# Patient Record
Sex: Female | Born: 1935 | Race: White | Hispanic: No | Smoking: Never smoker
Health system: Southern US, Community
[De-identification: ages and names within clinical notes are randomized; demographics above are authoritative.]

## PROBLEM LIST (undated history)

## (undated) DIAGNOSIS — I639 Cerebral infarction, unspecified: Secondary | ICD-10-CM

## (undated) DIAGNOSIS — A048 Other specified bacterial intestinal infections: Secondary | ICD-10-CM

## (undated) DIAGNOSIS — J45909 Unspecified asthma, uncomplicated: Secondary | ICD-10-CM

## (undated) DIAGNOSIS — I10 Essential (primary) hypertension: Secondary | ICD-10-CM

## (undated) DIAGNOSIS — F0390 Unspecified dementia without behavioral disturbance: Secondary | ICD-10-CM

## (undated) HISTORY — PX: CHOLECYSTECTOMY: SHX55

## (undated) HISTORY — PX: COLECTOMY: SHX59

## (undated) HISTORY — DX: Other specified bacterial intestinal infections: A04.8

---

## 2008-08-28 ENCOUNTER — Ambulatory Visit (HOSPITAL_COMMUNITY): Admission: RE | Admit: 2008-08-28 | Discharge: 2008-08-28 | Payer: Self-pay | Admitting: Preventative Medicine

## 2009-01-11 ENCOUNTER — Inpatient Hospital Stay: Admission: RE | Admit: 2009-01-11 | Discharge: 2009-03-09 | Payer: Self-pay | Admitting: Internal Medicine

## 2009-02-18 ENCOUNTER — Ambulatory Visit (HOSPITAL_COMMUNITY): Admission: RE | Admit: 2009-02-18 | Discharge: 2009-02-18 | Payer: Self-pay | Admitting: Internal Medicine

## 2009-04-13 ENCOUNTER — Encounter (HOSPITAL_COMMUNITY): Admission: RE | Admit: 2009-04-13 | Discharge: 2009-05-13 | Payer: Self-pay | Admitting: Internal Medicine

## 2009-05-14 ENCOUNTER — Encounter (HOSPITAL_COMMUNITY): Admission: RE | Admit: 2009-05-14 | Discharge: 2009-06-13 | Payer: Self-pay | Admitting: Internal Medicine

## 2009-06-15 ENCOUNTER — Encounter (HOSPITAL_COMMUNITY): Admission: RE | Admit: 2009-06-15 | Discharge: 2009-07-14 | Payer: Self-pay | Admitting: Internal Medicine

## 2011-01-24 LAB — GLUCOSE, CAPILLARY
Glucose-Capillary: 116 mg/dL — ABNORMAL HIGH (ref 70–99)
Glucose-Capillary: 117 mg/dL — ABNORMAL HIGH (ref 70–99)
Glucose-Capillary: 118 mg/dL — ABNORMAL HIGH (ref 70–99)
Glucose-Capillary: 119 mg/dL — ABNORMAL HIGH (ref 70–99)
Glucose-Capillary: 136 mg/dL — ABNORMAL HIGH (ref 70–99)
Glucose-Capillary: 144 mg/dL — ABNORMAL HIGH (ref 70–99)
Glucose-Capillary: 154 mg/dL — ABNORMAL HIGH (ref 70–99)
Glucose-Capillary: 163 mg/dL — ABNORMAL HIGH (ref 70–99)
Glucose-Capillary: 164 mg/dL — ABNORMAL HIGH (ref 70–99)
Glucose-Capillary: 165 mg/dL — ABNORMAL HIGH (ref 70–99)
Glucose-Capillary: 172 mg/dL — ABNORMAL HIGH (ref 70–99)
Glucose-Capillary: 173 mg/dL — ABNORMAL HIGH (ref 70–99)
Glucose-Capillary: 189 mg/dL — ABNORMAL HIGH (ref 70–99)
Glucose-Capillary: 207 mg/dL — ABNORMAL HIGH (ref 70–99)
Glucose-Capillary: 214 mg/dL — ABNORMAL HIGH (ref 70–99)
Glucose-Capillary: 114 mg/dL — ABNORMAL HIGH (ref 70–99)
Glucose-Capillary: 121 mg/dL — ABNORMAL HIGH (ref 70–99)
Glucose-Capillary: 122 mg/dL — ABNORMAL HIGH (ref 70–99)
Glucose-Capillary: 131 mg/dL — ABNORMAL HIGH (ref 70–99)
Glucose-Capillary: 131 mg/dL — ABNORMAL HIGH (ref 70–99)
Glucose-Capillary: 135 mg/dL — ABNORMAL HIGH (ref 70–99)
Glucose-Capillary: 136 mg/dL — ABNORMAL HIGH (ref 70–99)
Glucose-Capillary: 141 mg/dL — ABNORMAL HIGH (ref 70–99)
Glucose-Capillary: 142 mg/dL — ABNORMAL HIGH (ref 70–99)
Glucose-Capillary: 147 mg/dL — ABNORMAL HIGH (ref 70–99)
Glucose-Capillary: 150 mg/dL — ABNORMAL HIGH (ref 70–99)
Glucose-Capillary: 152 mg/dL — ABNORMAL HIGH (ref 70–99)
Glucose-Capillary: 152 mg/dL — ABNORMAL HIGH (ref 70–99)
Glucose-Capillary: 152 mg/dL — ABNORMAL HIGH (ref 70–99)
Glucose-Capillary: 156 mg/dL — ABNORMAL HIGH (ref 70–99)
Glucose-Capillary: 161 mg/dL — ABNORMAL HIGH (ref 70–99)
Glucose-Capillary: 163 mg/dL — ABNORMAL HIGH (ref 70–99)
Glucose-Capillary: 163 mg/dL — ABNORMAL HIGH (ref 70–99)
Glucose-Capillary: 164 mg/dL — ABNORMAL HIGH (ref 70–99)
Glucose-Capillary: 168 mg/dL — ABNORMAL HIGH (ref 70–99)
Glucose-Capillary: 171 mg/dL — ABNORMAL HIGH (ref 70–99)
Glucose-Capillary: 176 mg/dL — ABNORMAL HIGH (ref 70–99)
Glucose-Capillary: 179 mg/dL — ABNORMAL HIGH (ref 70–99)
Glucose-Capillary: 182 mg/dL — ABNORMAL HIGH (ref 70–99)
Glucose-Capillary: 183 mg/dL — ABNORMAL HIGH (ref 70–99)
Glucose-Capillary: 183 mg/dL — ABNORMAL HIGH (ref 70–99)
Glucose-Capillary: 187 mg/dL — ABNORMAL HIGH (ref 70–99)
Glucose-Capillary: 189 mg/dL — ABNORMAL HIGH (ref 70–99)
Glucose-Capillary: 190 mg/dL — ABNORMAL HIGH (ref 70–99)
Glucose-Capillary: 196 mg/dL — ABNORMAL HIGH (ref 70–99)
Glucose-Capillary: 205 mg/dL — ABNORMAL HIGH (ref 70–99)
Glucose-Capillary: 217 mg/dL — ABNORMAL HIGH (ref 70–99)
Glucose-Capillary: 223 mg/dL — ABNORMAL HIGH (ref 70–99)
Glucose-Capillary: 234 mg/dL — ABNORMAL HIGH (ref 70–99)

## 2011-01-25 LAB — GLUCOSE, CAPILLARY
Glucose-Capillary: 138 mg/dL — ABNORMAL HIGH (ref 70–99)
Glucose-Capillary: 142 mg/dL — ABNORMAL HIGH (ref 70–99)
Glucose-Capillary: 145 mg/dL — ABNORMAL HIGH (ref 70–99)
Glucose-Capillary: 155 mg/dL — ABNORMAL HIGH (ref 70–99)
Glucose-Capillary: 157 mg/dL — ABNORMAL HIGH (ref 70–99)
Glucose-Capillary: 158 mg/dL — ABNORMAL HIGH (ref 70–99)
Glucose-Capillary: 164 mg/dL — ABNORMAL HIGH (ref 70–99)
Glucose-Capillary: 165 mg/dL — ABNORMAL HIGH (ref 70–99)
Glucose-Capillary: 167 mg/dL — ABNORMAL HIGH (ref 70–99)
Glucose-Capillary: 169 mg/dL — ABNORMAL HIGH (ref 70–99)
Glucose-Capillary: 170 mg/dL — ABNORMAL HIGH (ref 70–99)
Glucose-Capillary: 175 mg/dL — ABNORMAL HIGH (ref 70–99)
Glucose-Capillary: 182 mg/dL — ABNORMAL HIGH (ref 70–99)
Glucose-Capillary: 191 mg/dL — ABNORMAL HIGH (ref 70–99)
Glucose-Capillary: 191 mg/dL — ABNORMAL HIGH (ref 70–99)
Glucose-Capillary: 192 mg/dL — ABNORMAL HIGH (ref 70–99)
Glucose-Capillary: 192 mg/dL — ABNORMAL HIGH (ref 70–99)
Glucose-Capillary: 192 mg/dL — ABNORMAL HIGH (ref 70–99)
Glucose-Capillary: 196 mg/dL — ABNORMAL HIGH (ref 70–99)
Glucose-Capillary: 197 mg/dL — ABNORMAL HIGH (ref 70–99)
Glucose-Capillary: 199 mg/dL — ABNORMAL HIGH (ref 70–99)
Glucose-Capillary: 209 mg/dL — ABNORMAL HIGH (ref 70–99)
Glucose-Capillary: 214 mg/dL — ABNORMAL HIGH (ref 70–99)
Glucose-Capillary: 214 mg/dL — ABNORMAL HIGH (ref 70–99)
Glucose-Capillary: 215 mg/dL — ABNORMAL HIGH (ref 70–99)
Glucose-Capillary: 218 mg/dL — ABNORMAL HIGH (ref 70–99)
Glucose-Capillary: 221 mg/dL — ABNORMAL HIGH (ref 70–99)
Glucose-Capillary: 225 mg/dL — ABNORMAL HIGH (ref 70–99)
Glucose-Capillary: 233 mg/dL — ABNORMAL HIGH (ref 70–99)
Glucose-Capillary: 153 mg/dL — ABNORMAL HIGH (ref 70–99)
Glucose-Capillary: 164 mg/dL — ABNORMAL HIGH (ref 70–99)
Glucose-Capillary: 164 mg/dL — ABNORMAL HIGH (ref 70–99)
Glucose-Capillary: 165 mg/dL — ABNORMAL HIGH (ref 70–99)
Glucose-Capillary: 169 mg/dL — ABNORMAL HIGH (ref 70–99)
Glucose-Capillary: 179 mg/dL — ABNORMAL HIGH (ref 70–99)
Glucose-Capillary: 186 mg/dL — ABNORMAL HIGH (ref 70–99)
Glucose-Capillary: 189 mg/dL — ABNORMAL HIGH (ref 70–99)
Glucose-Capillary: 194 mg/dL — ABNORMAL HIGH (ref 70–99)
Glucose-Capillary: 194 mg/dL — ABNORMAL HIGH (ref 70–99)
Glucose-Capillary: 195 mg/dL — ABNORMAL HIGH (ref 70–99)
Glucose-Capillary: 197 mg/dL — ABNORMAL HIGH (ref 70–99)
Glucose-Capillary: 197 mg/dL — ABNORMAL HIGH (ref 70–99)
Glucose-Capillary: 199 mg/dL — ABNORMAL HIGH (ref 70–99)
Glucose-Capillary: 199 mg/dL — ABNORMAL HIGH (ref 70–99)
Glucose-Capillary: 200 mg/dL — ABNORMAL HIGH (ref 70–99)
Glucose-Capillary: 206 mg/dL — ABNORMAL HIGH (ref 70–99)
Glucose-Capillary: 207 mg/dL — ABNORMAL HIGH (ref 70–99)
Glucose-Capillary: 207 mg/dL — ABNORMAL HIGH (ref 70–99)
Glucose-Capillary: 219 mg/dL — ABNORMAL HIGH (ref 70–99)
Glucose-Capillary: 221 mg/dL — ABNORMAL HIGH (ref 70–99)
Glucose-Capillary: 228 mg/dL — ABNORMAL HIGH (ref 70–99)
Glucose-Capillary: 229 mg/dL — ABNORMAL HIGH (ref 70–99)
Glucose-Capillary: 237 mg/dL — ABNORMAL HIGH (ref 70–99)
Glucose-Capillary: 238 mg/dL — ABNORMAL HIGH (ref 70–99)
Glucose-Capillary: 249 mg/dL — ABNORMAL HIGH (ref 70–99)
Glucose-Capillary: 256 mg/dL — ABNORMAL HIGH (ref 70–99)
Glucose-Capillary: 258 mg/dL — ABNORMAL HIGH (ref 70–99)
Glucose-Capillary: 269 mg/dL — ABNORMAL HIGH (ref 70–99)

## 2011-01-26 LAB — GLUCOSE, CAPILLARY
Glucose-Capillary: 146 mg/dL — ABNORMAL HIGH (ref 70–99)
Glucose-Capillary: 174 mg/dL — ABNORMAL HIGH (ref 70–99)
Glucose-Capillary: 180 mg/dL — ABNORMAL HIGH (ref 70–99)
Glucose-Capillary: 183 mg/dL — ABNORMAL HIGH (ref 70–99)
Glucose-Capillary: 186 mg/dL — ABNORMAL HIGH (ref 70–99)

## 2012-04-06 ENCOUNTER — Emergency Department (HOSPITAL_COMMUNITY): Payer: Medicare Other

## 2012-04-06 ENCOUNTER — Emergency Department (HOSPITAL_COMMUNITY)
Admission: EM | Admit: 2012-04-06 | Discharge: 2012-04-06 | Disposition: A | Discharge status: Other Acute Inpatient Hospital | Payer: Medicare Other | Attending: Emergency Medicine | Admitting: Emergency Medicine

## 2012-04-06 ENCOUNTER — Encounter (HOSPITAL_COMMUNITY): Payer: Self-pay | Admitting: *Deleted

## 2012-04-06 DIAGNOSIS — S72009A Fracture of unspecified part of neck of unspecified femur, initial encounter for closed fracture: Secondary | ICD-10-CM | POA: Insufficient documentation

## 2012-04-06 DIAGNOSIS — D72829 Elevated white blood cell count, unspecified: Secondary | ICD-10-CM | POA: Insufficient documentation

## 2012-04-06 DIAGNOSIS — I10 Essential (primary) hypertension: Secondary | ICD-10-CM | POA: Insufficient documentation

## 2012-04-06 DIAGNOSIS — E871 Hypo-osmolality and hyponatremia: Secondary | ICD-10-CM | POA: Insufficient documentation

## 2012-04-06 DIAGNOSIS — W19XXXA Unspecified fall, initial encounter: Secondary | ICD-10-CM

## 2012-04-06 DIAGNOSIS — Z8673 Personal history of transient ischemic attack (TIA), and cerebral infarction without residual deficits: Secondary | ICD-10-CM | POA: Insufficient documentation

## 2012-04-06 DIAGNOSIS — W08XXXA Fall from other furniture, initial encounter: Secondary | ICD-10-CM | POA: Insufficient documentation

## 2012-04-06 DIAGNOSIS — S72002A Fracture of unspecified part of neck of left femur, initial encounter for closed fracture: Secondary | ICD-10-CM | POA: Insufficient documentation

## 2012-04-06 DIAGNOSIS — Z79899 Other long term (current) drug therapy: Secondary | ICD-10-CM | POA: Insufficient documentation

## 2012-04-06 DIAGNOSIS — Y93E9 Activity, other interior property and clothing maintenance: Secondary | ICD-10-CM | POA: Insufficient documentation

## 2012-04-06 DIAGNOSIS — E119 Type 2 diabetes mellitus without complications: Secondary | ICD-10-CM | POA: Insufficient documentation

## 2012-04-06 DIAGNOSIS — J45909 Unspecified asthma, uncomplicated: Secondary | ICD-10-CM | POA: Insufficient documentation

## 2012-04-06 DIAGNOSIS — M25559 Pain in unspecified hip: Secondary | ICD-10-CM | POA: Insufficient documentation

## 2012-04-06 HISTORY — DX: Essential (primary) hypertension: I10

## 2012-04-06 HISTORY — DX: Cerebral infarction, unspecified: I63.9

## 2012-04-06 HISTORY — DX: Unspecified asthma, uncomplicated: J45.909

## 2012-04-06 LAB — URINALYSIS, ROUTINE W REFLEX MICROSCOPIC
Bilirubin Urine: NEGATIVE
Hgb urine dipstick: NEGATIVE
Nitrite: NEGATIVE
Protein, ur: NEGATIVE mg/dL
Specific Gravity, Urine: 1.025 (ref 1.005–1.030)
Urobilinogen, UA: 0.2 mg/dL (ref 0.0–1.0)

## 2012-04-06 LAB — PROTIME-INR
INR: 1 (ref 0.00–1.49)
Prothrombin Time: 13.4 seconds (ref 11.6–15.2)

## 2012-04-06 LAB — CBC
HCT: 42.1 % (ref 36.0–46.0)
Hemoglobin: 14.7 g/dL (ref 12.0–15.0)
MCH: 32.2 pg (ref 26.0–34.0)
MCHC: 34.9 g/dL (ref 30.0–36.0)
MCV: 92.1 fL (ref 78.0–100.0)
RDW: 12.4 % (ref 11.5–15.5)

## 2012-04-06 LAB — COMPREHENSIVE METABOLIC PANEL
AST: 26 U/L (ref 0–37)
Albumin: 4 g/dL (ref 3.5–5.2)
BUN: 16 mg/dL (ref 6–23)
Calcium: 9.8 mg/dL (ref 8.4–10.5)
Creatinine, Ser: 0.82 mg/dL (ref 0.50–1.10)
Total Bilirubin: 0.9 mg/dL (ref 0.3–1.2)

## 2012-04-06 LAB — DIFFERENTIAL
Basophils Absolute: 0 10*3/uL (ref 0.0–0.1)
Basophils Relative: 0 % (ref 0–1)
Eosinophils Absolute: 0 10*3/uL (ref 0.0–0.7)
Eosinophils Relative: 0 % (ref 0–5)
Monocytes Absolute: 1.1 10*3/uL — ABNORMAL HIGH (ref 0.1–1.0)
Monocytes Relative: 6 % (ref 3–12)
Neutro Abs: 15 10*3/uL — ABNORMAL HIGH (ref 1.7–7.7)

## 2012-04-06 LAB — URINE MICROSCOPIC-ADD ON

## 2012-04-06 LAB — APTT: aPTT: 25 seconds (ref 24–37)

## 2012-04-06 MED ORDER — ONDANSETRON HCL 4 MG/2ML IJ SOLN
INTRAMUSCULAR | Status: AC
  Administered 2012-04-06: 4 mg via INTRAVENOUS
  Filled 2012-04-06: qty 2

## 2012-04-06 MED ORDER — ONDANSETRON HCL 4 MG/2ML IJ SOLN
4.0000 mg | Freq: Once | INTRAMUSCULAR | Status: AC
  Administered 2012-04-06: 4 mg via INTRAVENOUS

## 2012-04-06 MED ORDER — HYDROMORPHONE HCL PF 1 MG/ML IJ SOLN
1.0000 mg | Freq: Once | INTRAMUSCULAR | Status: AC
  Administered 2012-04-06: 1 mg via INTRAVENOUS
  Filled 2012-04-06: qty 1

## 2012-04-06 MED ORDER — ONDANSETRON HCL 4 MG/2ML IJ SOLN
4.0000 mg | Freq: Once | INTRAMUSCULAR | Status: AC
  Administered 2012-04-06: 4 mg via INTRAVENOUS
  Filled 2012-04-06: qty 2

## 2012-04-06 MED ORDER — MORPHINE SULFATE 4 MG/ML IJ SOLN
4.0000 mg | Freq: Once | INTRAMUSCULAR | Status: AC
  Administered 2012-04-06: 4 mg via INTRAVENOUS
  Filled 2012-04-06: qty 1

## 2012-04-06 MED ORDER — SODIUM CHLORIDE 0.9 % IV SOLN
INTRAVENOUS | Status: DC
  Administered 2012-04-06: 15:00:00 via INTRAVENOUS

## 2012-04-06 NOTE — ED Notes (Signed)
Fall approx 0900 this morning.  Denies hitting head/loc.  C/o pain to left hip.  Mild left hip rotation noted.  Pt alert and oriented x 4.  No bruising/abrasions/deformities noted.

## 2012-04-06 NOTE — ED Provider Notes (Signed)
History    This chart was scribed for Ward Givens, MD, MD by Smitty Pluck. The patient was seen in room APA12 and the patient's care was started at 2:54PM.   CSN: [id number]  Arrival date & time 04/06/12  1435   First MD Initiated Contact with Patient 04/06/12 1449      Chief Complaint  Patient presents with  . fall, hip pain     (Consider location/radiation/quality/duration/timing/severity/associated sxs/prior treatment) The history is provided by the patient.   Sabrina [name] is a 78 y.o. female who presents to the Emergency Department BIB EMS complaining of moderate left hip pain due to falling while standing on stool trying to clean a celing fan today this 9  AM. Pt denies hitting head and LOC. Pt was assisted by husband after fall. She states that she has been unable to bear weight or  walk due to pain. Denies using blood anticoagulants. Denies smoking and drinking. Pain has been constant since onset without radiation.    PCP is Dr. Sherryll Burger in [facility] No orthopedist  Past Medical History  Diagnosis Date  . Diabetes mellitus   . Hypertension   . Stroke   . Asthma     Past Surgical History  Procedure Date  . Colon surgery     No family history on file.  History  Substance Use Topics  . Smoking status: Never Smoker   . Smokeless tobacco: Not on file  . Alcohol Use: No  lives at home Lives with husband  OB History    Grav Para Term Preterm Abortions TAB SAB Ect Mult Living                  Review of Systems  All other systems reviewed and are negative.  10 Systems reviewed and all are negative for acute change except as noted in the HPI.    Allergies  Review of patient's allergies indicates no known allergies.  Home Medications   Current Outpatient Rx  Name Route Sig Dispense Refill  . LISINOPRIL 20 MG PO TABS Oral Take 20 mg by mouth daily.    Marland Kitchen METOPROLOL TARTRATE 50 MG PO TABS Oral Take 50 mg by mouth 2 (two) times daily.    Marland Kitchen OMEPRAZOLE 40 MG PO  CPDR Oral Take 40 mg by mouth 2 (two) times daily.      BP 153/71  Pulse 70  Temp 97.9 F (36.6 C) (Oral)  Resp 16  Ht 5\' 3"  (1.6 m)  Wt 136 lb (61.689 kg)  BMI 24.09 kg/m2  SpO2 91%  Vital signs normal    Physical Exam  Nursing note and vitals reviewed. Constitutional: She is oriented to person, place, and time. She appears well-developed and well-nourished. No distress.  HENT:  Head: Normocephalic and atraumatic.  Right Ear: External ear normal.  Left Ear: External ear normal.  Mouth/Throat: Oropharynx is clear and moist.       No pain to palpation of head  Eyes: Conjunctivae and EOM are normal. Pupils are equal, round, and reactive to light.  Neck: Normal range of motion. Neck supple.  Cardiovascular: Normal rate, regular rhythm and normal heart sounds.   No murmur heard. Pulmonary/Chest: Effort normal and breath sounds normal. No respiratory distress. She has no wheezes. She has no rales. She exhibits no tenderness.  Abdominal: Soft. Bowel sounds are normal. She exhibits no distension. There is no tenderness. There is no rebound and no guarding.  Musculoskeletal:  Left knee flexed over pillow so unable to assess limb shortening or rotation but she has pain in her prox left thigh when she attempts to move her leg  Neurological: She is alert and oriented to person, place, and time.  Skin: Skin is warm and dry.  Psychiatric: She has a normal mood and affect. Her behavior is normal.    ED Course  Procedures (including critical care time)   Medications  0.9 %  sodium chloride infusion (  Intravenous New Bag/Given 04/06/12 1516)  morphine 4 MG/ML injection 4 mg (4 mg Intravenous Given 04/06/12 1516)  ondansetron (ZOFRAN) injection 4 mg (4 mg Intravenous Given 04/06/12 1516)  HYDROmorphone (DILAUDID) injection 1 mg (1 mg Intravenous Given 04/06/12 1713)   16:15 Husband in room, states she cannot have any blood products, wants her to go to [facility] where she had colon  surgery.   17:10 Dr Kathi Ludwig orthopedics at [facility] accepts in transfer to their ED.   DIAGNOSTIC STUDIES: Oxygen Saturation is 91% on room air, normal by my interpretation.    COORDINATION OF CARE: 3:02PM EDP discusses pt ED treatment with pt.  3:00PM EDP ordered medication: morphine 4 mg/ml, zofran 4 mg, 0.9% NaCl  Infusion    Results for orders placed during the hospital encounter of 04/06/12  CBC      Component Value Range   WBC 16.9 (*) 4.0 - 10.5 K/uL   RBC 4.57  3.87 - 5.11 MIL/uL   Hemoglobin 14.7  12.0 - 15.0 g/dL   HCT 16.1  09.6 - 04.5 %   MCV 92.1  78.0 - 100.0 fL   MCH 32.2  26.0 - 34.0 pg   MCHC 34.9  30.0 - 36.0 g/dL   RDW 40.9  81.1 - 91.4 %   Platelets 215  150 - 400 K/uL  DIFFERENTIAL      Component Value Range   Neutrophils Relative 89 (*) 43 - 77 %   Neutro Abs 15.0 (*) 1.7 - 7.7 K/uL   Lymphocytes Relative 5 (*) 12 - 46 %   Lymphs Abs 0.8  0.7 - 4.0 K/uL   Monocytes Relative 6  3 - 12 %   Monocytes Absolute 1.1 (*) 0.1 - 1.0 K/uL   Eosinophils Relative 0  0 - 5 %   Eosinophils Absolute 0.0  0.0 - 0.7 K/uL   Basophils Relative 0  0 - 1 %   Basophils Absolute 0.0  0.0 - 0.1 K/uL  COMPREHENSIVE METABOLIC PANEL      Component Value Range   Sodium 132 (*) 135 - 145 mEq/L   Potassium 3.6  3.5 - 5.1 mEq/L   Chloride 94 (*) 96 - 112 mEq/L   CO2 22  19 - 32 mEq/L   Glucose, Bld 286 (*) 70 - 99 mg/dL   BUN 16  6 - 23 mg/dL   Creatinine, Ser 7.82  0.50 - 1.10 mg/dL   Calcium 9.8  8.4 - 95.6 mg/dL   Total Protein 7.6  6.0 - 8.3 g/dL   Albumin 4.0  3.5 - 5.2 g/dL   AST 26  0 - 37 U/L   ALT 18  0 - 35 U/L   Alkaline Phosphatase 60  39 - 117 U/L   Total Bilirubin 0.9  0.3 - 1.2 mg/dL   GFR calc non Af Amer 67 (*) >90 mL/min   GFR calc Af Amer 78 (*) >90 mL/min  APTT      Component Value Range   aPTT  25  24 - 37 seconds  PROTIME-INR      Component Value Range   Prothrombin Time 13.4  11.6 - 15.2 seconds   INR 1.00  0.00 - 1.49  URINALYSIS, ROUTINE W  REFLEX MICROSCOPIC      Component Value Range   Color, Urine YELLOW  YELLOW   APPearance CLEAR  CLEAR   Specific Gravity, Urine 1.025  1.005 - 1.030   pH 5.5  5.0 - 8.0   Glucose, UA >1000 (*) NEGATIVE mg/dL   Hgb urine dipstick NEGATIVE  NEGATIVE   Bilirubin Urine NEGATIVE  NEGATIVE   Ketones, ur NEGATIVE  NEGATIVE mg/dL   Protein, ur NEGATIVE  NEGATIVE mg/dL   Urobilinogen, UA 0.2  0.0 - 1.0 mg/dL   Nitrite NEGATIVE  NEGATIVE   Leukocytes, UA NEGATIVE  NEGATIVE  URINE MICROSCOPIC-ADD ON      Component Value Range   RBC / HPF 3-6  <3 RBC/hpf   Bacteria, UA FEW (*) RARE   Laboratory interpretation all normal except , leukocytosis, mild hyponatremia and low chloride  Dg Chest 1 View  04/06/2012  *RADIOLOGY REPORT*  Clinical Data: Fall with left hip pain.  CHEST - 1 VIEW  Comparison: None  Findings: The cardiomediastinal silhouette is unremarkable. Mild left basilar atelectasis/scarring is noted. There is no evidence of focal airspace disease, pulmonary edema, suspicious pulmonary nodule/mass, pleural effusion, or pneumothorax. No acute bony abnormalities are identified.  IMPRESSION: Mild left basilar atelectasis/scarring.  Original Report Authenticated By: Rosendo Gros, M.D.   Dg Hip Complete Left  04/06/2012  *RADIOLOGY REPORT*  Clinical Data: Fall with left hip pain.  LEFT HIP - COMPLETE 2+ VIEW  Comparison: None  Findings: There is a left femoral neck fracture with 2-3 cm superior displacement. There is no evidence of subluxation or dislocation. No other bony abnormalities are identified.  IMPRESSION: Left femoral neck fracture with 2-3 cm superior displacement.  Original Report Authenticated By: Rosendo Gros, M.D.      Date: 04/06/2012  Rate: 73  Rhythm: normal sinus rhythm  QRS Axis: normal  Intervals: normal  ST/T Wave abnormalities: normal  Conduction Disutrbances:none  Narrative Interpretation:   Old EKG Reviewed: none available      1. Hip fracture, left   2.  Fall     Plan transfer to [facility] ED for admission  Devoria Albe, MD, FACEP   MDM   I personally performed the services described in this documentation, which was scribed in my presence. The recorded information has been reviewed and considered.  Devoria Albe, MD, Armando Gang        Ward Givens, MD 04/06/12 2041

## 2012-04-06 NOTE — ED Notes (Signed)
Pt actively vomiting.  meds given.

## 2012-04-06 NOTE — ED Notes (Signed)
Pt verbalized to NT worsening pain.  Dr Lynelle Doctor notified of pt's pain.  No new orders received at this time.

## 2012-04-07 DIAGNOSIS — S7290XA Unspecified fracture of unspecified femur, initial encounter for closed fracture: Secondary | ICD-10-CM | POA: Insufficient documentation

## 2012-04-12 ENCOUNTER — Inpatient Hospital Stay
Admission: RE | Admit: 2012-04-12 | Discharge: 2012-06-02 | Disposition: A | Discharge status: Home / Self Care | Payer: Medicare Other | Source: Ambulatory Visit | Attending: Internal Medicine | Admitting: Internal Medicine

## 2012-04-12 DIAGNOSIS — R52 Pain, unspecified: Principal | ICD-10-CM

## 2012-04-15 LAB — GLUCOSE, CAPILLARY
Glucose-Capillary: 177 mg/dL — ABNORMAL HIGH (ref 70–99)
Glucose-Capillary: 145 mg/dL — ABNORMAL HIGH (ref 70–99)
Glucose-Capillary: 142 mg/dL — ABNORMAL HIGH (ref 70–99)

## 2012-04-16 LAB — GLUCOSE, CAPILLARY
Glucose-Capillary: 199 mg/dL — ABNORMAL HIGH (ref 70–99)
Glucose-Capillary: 202 mg/dL — ABNORMAL HIGH (ref 70–99)
Glucose-Capillary: 219 mg/dL — ABNORMAL HIGH (ref 70–99)
Glucose-Capillary: 174 mg/dL — ABNORMAL HIGH (ref 70–99)
Glucose-Capillary: 199 mg/dL — ABNORMAL HIGH (ref 70–99)

## 2012-04-17 LAB — GLUCOSE, CAPILLARY: Glucose-Capillary: 137 mg/dL — ABNORMAL HIGH (ref 70–99)

## 2012-04-18 LAB — GLUCOSE, CAPILLARY
Glucose-Capillary: 138 mg/dL — ABNORMAL HIGH (ref 70–99)
Glucose-Capillary: 196 mg/dL — ABNORMAL HIGH (ref 70–99)

## 2012-04-19 ENCOUNTER — Inpatient Hospital Stay (HOSPITAL_COMMUNITY): Payer: Medicare Other

## 2012-04-19 ENCOUNTER — Ambulatory Visit (HOSPITAL_COMMUNITY)
Admit: 2012-04-19 | Discharge: 2012-04-19 | Disposition: A | Discharge status: Skilled Nursing Facility | Payer: Medicare Other | Source: Skilled Nursing Facility | Attending: Internal Medicine | Admitting: Internal Medicine

## 2012-04-19 DIAGNOSIS — Z96649 Presence of unspecified artificial hip joint: Secondary | ICD-10-CM | POA: Insufficient documentation

## 2012-04-19 DIAGNOSIS — M79609 Pain in unspecified limb: Secondary | ICD-10-CM | POA: Insufficient documentation

## 2012-04-19 LAB — GLUCOSE, CAPILLARY: Glucose-Capillary: 134 mg/dL — ABNORMAL HIGH (ref 70–99)

## 2012-04-20 LAB — GLUCOSE, CAPILLARY
Glucose-Capillary: 139 mg/dL — ABNORMAL HIGH (ref 70–99)
Glucose-Capillary: 192 mg/dL — ABNORMAL HIGH (ref 70–99)
Glucose-Capillary: 157 mg/dL — ABNORMAL HIGH (ref 70–99)

## 2012-04-21 LAB — GLUCOSE, CAPILLARY: Glucose-Capillary: 214 mg/dL — ABNORMAL HIGH (ref 70–99)

## 2012-04-22 LAB — GLUCOSE, CAPILLARY
Glucose-Capillary: 128 mg/dL — ABNORMAL HIGH (ref 70–99)
Glucose-Capillary: 174 mg/dL — ABNORMAL HIGH (ref 70–99)
Glucose-Capillary: 240 mg/dL — ABNORMAL HIGH (ref 70–99)

## 2012-04-23 LAB — GLUCOSE, CAPILLARY
Glucose-Capillary: 130 mg/dL — ABNORMAL HIGH (ref 70–99)
Glucose-Capillary: 142 mg/dL — ABNORMAL HIGH (ref 70–99)

## 2012-04-24 LAB — GLUCOSE, CAPILLARY
Glucose-Capillary: 115 mg/dL — ABNORMAL HIGH (ref 70–99)
Glucose-Capillary: 146 mg/dL — ABNORMAL HIGH (ref 70–99)
Glucose-Capillary: 147 mg/dL — ABNORMAL HIGH (ref 70–99)

## 2012-04-25 LAB — GLUCOSE, CAPILLARY: Glucose-Capillary: 196 mg/dL — ABNORMAL HIGH (ref 70–99)

## 2012-04-26 LAB — GLUCOSE, CAPILLARY
Glucose-Capillary: 152 mg/dL — ABNORMAL HIGH (ref 70–99)
Glucose-Capillary: 96 mg/dL (ref 70–99)

## 2012-04-27 LAB — GLUCOSE, CAPILLARY
Glucose-Capillary: 154 mg/dL — ABNORMAL HIGH (ref 70–99)
Glucose-Capillary: 151 mg/dL — ABNORMAL HIGH (ref 70–99)
Glucose-Capillary: 162 mg/dL — ABNORMAL HIGH (ref 70–99)

## 2012-04-28 LAB — GLUCOSE, CAPILLARY: Glucose-Capillary: 121 mg/dL — ABNORMAL HIGH (ref 70–99)

## 2012-04-29 LAB — GLUCOSE, CAPILLARY
Glucose-Capillary: 149 mg/dL — ABNORMAL HIGH (ref 70–99)
Glucose-Capillary: 170 mg/dL — ABNORMAL HIGH (ref 70–99)

## 2012-04-30 LAB — GLUCOSE, CAPILLARY: Glucose-Capillary: 149 mg/dL — ABNORMAL HIGH (ref 70–99)

## 2012-05-01 ENCOUNTER — Inpatient Hospital Stay (HOSPITAL_COMMUNITY): Payer: Medicare Other

## 2012-05-01 ENCOUNTER — Ambulatory Visit (HOSPITAL_COMMUNITY)
Admit: 2012-05-01 | Discharge: 2012-05-01 | Disposition: A | Discharge status: Skilled Nursing Facility | Payer: Medicare Other | Source: Skilled Nursing Facility | Attending: Internal Medicine | Admitting: Internal Medicine

## 2012-05-01 DIAGNOSIS — M79609 Pain in unspecified limb: Secondary | ICD-10-CM | POA: Insufficient documentation

## 2012-05-01 LAB — GLUCOSE, CAPILLARY
Glucose-Capillary: 105 mg/dL — ABNORMAL HIGH (ref 70–99)
Glucose-Capillary: 167 mg/dL — ABNORMAL HIGH (ref 70–99)
Glucose-Capillary: 152 mg/dL — ABNORMAL HIGH (ref 70–99)
Glucose-Capillary: 156 mg/dL — ABNORMAL HIGH (ref 70–99)

## 2012-05-02 LAB — GLUCOSE, CAPILLARY: Glucose-Capillary: 170 mg/dL — ABNORMAL HIGH (ref 70–99)

## 2012-05-03 LAB — GLUCOSE, CAPILLARY
Glucose-Capillary: 168 mg/dL — ABNORMAL HIGH (ref 70–99)
Glucose-Capillary: 178 mg/dL — ABNORMAL HIGH (ref 70–99)

## 2012-05-04 LAB — GLUCOSE, CAPILLARY: Glucose-Capillary: 114 mg/dL — ABNORMAL HIGH (ref 70–99)

## 2012-05-05 LAB — GLUCOSE, CAPILLARY
Glucose-Capillary: 111 mg/dL — ABNORMAL HIGH (ref 70–99)
Glucose-Capillary: 159 mg/dL — ABNORMAL HIGH (ref 70–99)

## 2012-05-06 LAB — GLUCOSE, CAPILLARY
Glucose-Capillary: 130 mg/dL — ABNORMAL HIGH (ref 70–99)
Glucose-Capillary: 218 mg/dL — ABNORMAL HIGH (ref 70–99)
Glucose-Capillary: 112 mg/dL — ABNORMAL HIGH (ref 70–99)
Glucose-Capillary: 147 mg/dL — ABNORMAL HIGH (ref 70–99)

## 2012-05-07 LAB — GLUCOSE, CAPILLARY
Glucose-Capillary: 156 mg/dL — ABNORMAL HIGH (ref 70–99)
Glucose-Capillary: 145 mg/dL — ABNORMAL HIGH (ref 70–99)

## 2012-05-08 LAB — GLUCOSE, CAPILLARY
Glucose-Capillary: 129 mg/dL — ABNORMAL HIGH (ref 70–99)
Glucose-Capillary: 174 mg/dL — ABNORMAL HIGH (ref 70–99)
Glucose-Capillary: 199 mg/dL — ABNORMAL HIGH (ref 70–99)

## 2012-05-09 LAB — GLUCOSE, CAPILLARY: Glucose-Capillary: 168 mg/dL — ABNORMAL HIGH (ref 70–99)

## 2012-05-10 LAB — GLUCOSE, CAPILLARY
Glucose-Capillary: 115 mg/dL — ABNORMAL HIGH (ref 70–99)
Glucose-Capillary: 159 mg/dL — ABNORMAL HIGH (ref 70–99)
Glucose-Capillary: 185 mg/dL — ABNORMAL HIGH (ref 70–99)

## 2012-05-11 LAB — GLUCOSE, CAPILLARY: Glucose-Capillary: 175 mg/dL — ABNORMAL HIGH (ref 70–99)

## 2012-05-12 LAB — GLUCOSE, CAPILLARY
Glucose-Capillary: 104 mg/dL — ABNORMAL HIGH (ref 70–99)
Glucose-Capillary: 180 mg/dL — ABNORMAL HIGH (ref 70–99)
Glucose-Capillary: 186 mg/dL — ABNORMAL HIGH (ref 70–99)

## 2012-05-13 LAB — GLUCOSE, CAPILLARY: Glucose-Capillary: 127 mg/dL — ABNORMAL HIGH (ref 70–99)

## 2012-05-14 LAB — GLUCOSE, CAPILLARY: Glucose-Capillary: 131 mg/dL — ABNORMAL HIGH (ref 70–99)

## 2012-05-15 LAB — GLUCOSE, CAPILLARY
Glucose-Capillary: 103 mg/dL — ABNORMAL HIGH (ref 70–99)
Glucose-Capillary: 164 mg/dL — ABNORMAL HIGH (ref 70–99)
Glucose-Capillary: 154 mg/dL — ABNORMAL HIGH (ref 70–99)

## 2012-05-16 LAB — GLUCOSE, CAPILLARY
Glucose-Capillary: 153 mg/dL — ABNORMAL HIGH (ref 70–99)
Glucose-Capillary: 113 mg/dL — ABNORMAL HIGH (ref 70–99)

## 2012-05-17 LAB — GLUCOSE, CAPILLARY
Glucose-Capillary: 192 mg/dL — ABNORMAL HIGH (ref 70–99)
Glucose-Capillary: 185 mg/dL — ABNORMAL HIGH (ref 70–99)
Glucose-Capillary: 164 mg/dL — ABNORMAL HIGH (ref 70–99)

## 2012-05-18 LAB — GLUCOSE, CAPILLARY
Glucose-Capillary: 115 mg/dL — ABNORMAL HIGH (ref 70–99)
Glucose-Capillary: 147 mg/dL — ABNORMAL HIGH (ref 70–99)

## 2012-05-19 LAB — GLUCOSE, CAPILLARY: Glucose-Capillary: 180 mg/dL — ABNORMAL HIGH (ref 70–99)

## 2012-05-20 LAB — GLUCOSE, CAPILLARY: Glucose-Capillary: 208 mg/dL — ABNORMAL HIGH (ref 70–99)

## 2012-05-21 LAB — GLUCOSE, CAPILLARY: Glucose-Capillary: 178 mg/dL — ABNORMAL HIGH (ref 70–99)

## 2012-05-22 LAB — GLUCOSE, CAPILLARY: Glucose-Capillary: 127 mg/dL — ABNORMAL HIGH (ref 70–99)

## 2012-05-23 LAB — GLUCOSE, CAPILLARY
Glucose-Capillary: 122 mg/dL — ABNORMAL HIGH (ref 70–99)
Glucose-Capillary: 166 mg/dL — ABNORMAL HIGH (ref 70–99)

## 2012-05-24 LAB — GLUCOSE, CAPILLARY
Glucose-Capillary: 130 mg/dL — ABNORMAL HIGH (ref 70–99)
Glucose-Capillary: 166 mg/dL — ABNORMAL HIGH (ref 70–99)
Glucose-Capillary: 140 mg/dL — ABNORMAL HIGH (ref 70–99)

## 2012-05-25 LAB — GLUCOSE, CAPILLARY: Glucose-Capillary: 251 mg/dL — ABNORMAL HIGH (ref 70–99)

## 2012-05-26 LAB — GLUCOSE, CAPILLARY
Glucose-Capillary: 116 mg/dL — ABNORMAL HIGH (ref 70–99)
Glucose-Capillary: 206 mg/dL — ABNORMAL HIGH (ref 70–99)
Glucose-Capillary: 182 mg/dL — ABNORMAL HIGH (ref 70–99)

## 2012-05-28 LAB — GLUCOSE, CAPILLARY
Glucose-Capillary: 176 mg/dL — ABNORMAL HIGH (ref 70–99)
Glucose-Capillary: 152 mg/dL — ABNORMAL HIGH (ref 70–99)
Glucose-Capillary: 191 mg/dL — ABNORMAL HIGH (ref 70–99)

## 2012-05-29 LAB — GLUCOSE, CAPILLARY
Glucose-Capillary: 123 mg/dL — ABNORMAL HIGH (ref 70–99)
Glucose-Capillary: 158 mg/dL — ABNORMAL HIGH (ref 70–99)

## 2012-05-31 LAB — GLUCOSE, CAPILLARY
Glucose-Capillary: 123 mg/dL — ABNORMAL HIGH (ref 70–99)
Glucose-Capillary: 222 mg/dL — ABNORMAL HIGH (ref 70–99)
Glucose-Capillary: 148 mg/dL — ABNORMAL HIGH (ref 70–99)

## 2012-06-01 LAB — GLUCOSE, CAPILLARY: Glucose-Capillary: 120 mg/dL — ABNORMAL HIGH (ref 70–99)

## 2012-12-14 DIAGNOSIS — A048 Other specified bacterial intestinal infections: Secondary | ICD-10-CM

## 2012-12-14 HISTORY — DX: Other specified bacterial intestinal infections: A04.8

## 2012-12-27 ENCOUNTER — Other Ambulatory Visit (HOSPITAL_COMMUNITY): Payer: Self-pay | Admitting: Internal Medicine

## 2012-12-27 DIAGNOSIS — R112 Nausea with vomiting, unspecified: Secondary | ICD-10-CM

## 2013-01-01 ENCOUNTER — Encounter (HOSPITAL_COMMUNITY)
Admission: RE | Admit: 2013-01-01 | Discharge: 2013-01-01 | Disposition: A | Discharge status: Home / Self Care | Payer: Medicare Other | Source: Ambulatory Visit | Attending: Internal Medicine | Admitting: Internal Medicine

## 2013-01-01 DIAGNOSIS — R112 Nausea with vomiting, unspecified: Secondary | ICD-10-CM

## 2013-01-03 ENCOUNTER — Ambulatory Visit (HOSPITAL_COMMUNITY): Admission: RE | Admit: 2013-01-03 | Payer: Medicare Other | Source: Ambulatory Visit

## 2013-01-03 DIAGNOSIS — R112 Nausea with vomiting, unspecified: Secondary | ICD-10-CM | POA: Insufficient documentation

## 2013-01-06 ENCOUNTER — Encounter: Payer: Self-pay | Admitting: Gastroenterology

## 2013-01-06 ENCOUNTER — Ambulatory Visit (INDEPENDENT_AMBULATORY_CARE_PROVIDER_SITE_OTHER): Payer: Medicare Other | Admitting: Gastroenterology

## 2013-01-06 VITALS — BP 166/80 | HR 59 | Temp 97.5°F | Ht 63.0 in | Wt 121.8 lb

## 2013-01-06 DIAGNOSIS — R112 Nausea with vomiting, unspecified: Secondary | ICD-10-CM

## 2013-01-06 NOTE — Assessment & Plan Note (Addendum)
79 year old female with chronic nausea and vomiting for at least several years per her report. No dysphagia, melena, hematemesis. Denies GERD symptoms. Denies NSAIDs. To her knowledge, she has never had an upper endoscopy. She is currently taking a PPI twice per day. She was unable to tolerate a GES due to extreme nausea. Associated weight loss reported. Concern for stricture, gastritis/PUD, possible gastroparesis. Her husband is due for surveillance colonoscopy on Friday with Dr. Darrick Penna. Therefore, I have asked that she have an EGD/ED early this week due to her concerning symptoms.  Proceed with upper endoscopy and dilation on 3/25 with Dr. Darrick Penna. The risks, benefits, and alternatives have been discussed in detail with patient. They have stated understanding and desire to proceed.  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 Husband and wife desired to make this known prior to this procedure, despite knowing the low risk of bleeding. Copy of documentation for this to be scanned.

## 2013-01-06 NOTE — Patient Instructions (Addendum)
We have scheduled you for an upper endoscopy with Dr. Darrick Penna in the near future.  Continue taking Prilosec twice a day.   Further recommendations to follow

## 2013-01-06 NOTE — Progress Notes (Signed)
Primary Care Physician:  Sabrina Peri, MD Primary Gastroenterologist:  Dr. Darrick Penna   Chief Complaint  Patient presents with  . Abdominal Pain    HPI:   Sabrina [name] is a pleasant 79 year old female presenting today at the request of Dr. Sherryll Burger due to nausea and vomiting. Her husband is actually a patient of Dr. Darrick Penna as well. Reports 2-3 year history of intermittent N/V, now worsening. States throwing up constantly. Went for a GES, wasn't able to tolerate, started vomiting. Lost approximately 10 lbs in about a year. +nausea. Denies dysphagia. Able to tolerate liquids. Able to tolerate soft foods; however, this is intermittent. Nausea worsened with movement. No hematemesis. Looser stools since colectomy in recent past, but she does have a BM once per day. No melena. Last colonoscopy approximately 4 years ago.No prior EGD. No NSAIDs. No abdominal pain. No GERD.   J[employer] Witness, does not want blood products.   Past Medical History  Diagnosis Date  . Diabetes mellitus   . Hypertension   . Stroke   . Asthma     Past Surgical History  Procedure Laterality Date  . Colectomy      secondary to diverticulitis  . Cholecystectomy      Current Outpatient Prescriptions  Medication Sig Dispense Refill  . aspirin 81 MG tablet Take 81 mg by mouth daily.      Marland Kitchen lisinopril (PRINIVIL,ZESTRIL) 20 MG tablet Take 10 mg by mouth daily.       . metoprolol (LOPRESSOR) 50 MG tablet Take 50 mg by mouth 2 (two) times daily.      . Multiple Vitamin (MULTIVITAMIN) capsule Take 1 capsule by mouth daily.      Marland Kitchen omeprazole (PRILOSEC) 40 MG capsule Take 40 mg by mouth 2 (two) times daily.      Marland Kitchen UNABLE TO FIND Calcium Magnesium and Zink      . vitamin E 400 UNIT capsule Take 400 Units by mouth daily.       No current facility-administered medications for this visit.    Allergies as of 01/06/2013  . (No Known Allergies)    Family History  Problem Relation Age of Onset  . Colon cancer Neg Hx      History   Social History  . Marital Status: Married    Spouse Name: N/A    Number of Children: N/A  . Years of Education: N/A   Occupational History  . Not on file.   Social History Main Topics  . Smoking status: Never Smoker   . Smokeless tobacco: Not on file  . Alcohol Use: No  . Drug Use: No  . Sexually Active: Not on file   Other Topics Concern  . Not on file   Social History Narrative  . No narrative on file    Review of Systems: Gen: SEE HPI CV: occasional palpitations Resp: Denies shortness of breath at rest  GI: SEE HPI GU : Denies urinary burning, urinary frequency, urinary hesitancy MS: Denies joint pain, muscle weakness, cramps, or limitation of movement.  Derm: Denies rash, itching, dry skin Psych: +depression, "cries quite a bit", +memory loss Heme: Denies bruising, bleeding, and enlarged lymph nodes.  Physical Exam: BP 166/80  Pulse 59  Temp(Src) 97.5 F (36.4 C) (Oral)  Ht 5\' 3"  (1.6 m)  Wt 121 lb 12.8 oz (55.248 kg)  BMI 21.58 kg/m2 Attempted to have patient get on exam table, but she staggered and almost fell. Therefore, exam completed while she was in the chair. General:  Alert and oriented. Pleasant and cooperative. Well-nourished and well-developed.  Head:  Normocephalic and atraumatic. Eyes:  Without icterus, sclera clear and conjunctiva pink.  Ears:  Normal auditory acuity. Nose:  No deformity, discharge,  or lesions. Mouth:  No deformity or lesions, oral mucosa pink.  Neck:  Supple, without mass or thyromegaly. Lungs:  Clear to auscultation bilaterally. No wheezes, rales, or rhonchi. No distress.  Heart:  S1, S2 present without murmurs appreciated.  Abdomen:  +BS, soft, non-tender and non-distended. No HSM noted. No guarding or rebound. No masses appreciated.  Rectal:  Deferred  Msk:  Symmetrical without gross deformities. Normal posture. Extremities:  Without clubbing or edema. Neurologic:  Alert and  oriented x4;  grossly  normal neurologically. Skin:  Intact without significant lesions or rashes. Cervical Nodes:  No significant cervical adenopathy. Psych:  Alert and cooperative. Normal mood and affect.

## 2013-01-06 NOTE — Progress Notes (Signed)
Faxed to PCP

## 2013-01-07 ENCOUNTER — Telehealth: Payer: Self-pay | Admitting: Gastroenterology

## 2013-01-07 ENCOUNTER — Other Ambulatory Visit: Payer: Self-pay | Admitting: Gastroenterology

## 2013-01-07 ENCOUNTER — Encounter (HOSPITAL_COMMUNITY)
Admission: RE | Disposition: A | Discharge status: Home / Self Care | Payer: Self-pay | Source: Ambulatory Visit | Attending: Gastroenterology

## 2013-01-07 ENCOUNTER — Encounter (HOSPITAL_COMMUNITY): Payer: Self-pay | Admitting: *Deleted

## 2013-01-07 ENCOUNTER — Ambulatory Visit (HOSPITAL_COMMUNITY)
Admission: RE | Admit: 2013-01-07 | Discharge: 2013-01-07 | Disposition: A | Discharge status: Home / Self Care | Payer: Medicare Other | Source: Ambulatory Visit | Attending: Gastroenterology | Admitting: Gastroenterology

## 2013-01-07 DIAGNOSIS — I10 Essential (primary) hypertension: Secondary | ICD-10-CM | POA: Insufficient documentation

## 2013-01-07 DIAGNOSIS — K2980 Duodenitis without bleeding: Secondary | ICD-10-CM | POA: Insufficient documentation

## 2013-01-07 DIAGNOSIS — R112 Nausea with vomiting, unspecified: Secondary | ICD-10-CM

## 2013-01-07 DIAGNOSIS — R634 Abnormal weight loss: Secondary | ICD-10-CM | POA: Insufficient documentation

## 2013-01-07 DIAGNOSIS — K222 Esophageal obstruction: Secondary | ICD-10-CM

## 2013-01-07 DIAGNOSIS — A048 Other specified bacterial intestinal infections: Secondary | ICD-10-CM | POA: Insufficient documentation

## 2013-01-07 DIAGNOSIS — K2940 Chronic atrophic gastritis without bleeding: Secondary | ICD-10-CM | POA: Insufficient documentation

## 2013-01-07 DIAGNOSIS — E119 Type 2 diabetes mellitus without complications: Secondary | ICD-10-CM | POA: Insufficient documentation

## 2013-01-07 DIAGNOSIS — K5710 Diverticulosis of small intestine without perforation or abscess without bleeding: Secondary | ICD-10-CM

## 2013-01-07 DIAGNOSIS — R2689 Other abnormalities of gait and mobility: Secondary | ICD-10-CM

## 2013-01-07 HISTORY — PX: ESOPHAGOGASTRODUODENOSCOPY (EGD) WITH ESOPHAGEAL DILATION: SHX5812

## 2013-01-07 SURGERY — ESOPHAGOGASTRODUODENOSCOPY (EGD) WITH ESOPHAGEAL DILATION
Anesthesia: Moderate Sedation

## 2013-01-07 MED ORDER — BUTAMBEN-TETRACAINE-BENZOCAINE 2-2-14 % EX AERO
INHALATION_SPRAY | CUTANEOUS | Status: DC | PRN
  Administered 2013-01-07: 2 via TOPICAL

## 2013-01-07 MED ORDER — SODIUM CHLORIDE 0.9 % IV SOLN
INTRAVENOUS | Status: DC
  Administered 2013-01-07: 11:00:00 via INTRAVENOUS

## 2013-01-07 MED ORDER — MEPERIDINE HCL 100 MG/ML IJ SOLN
INTRAMUSCULAR | Status: DC | PRN
  Administered 2013-01-07 (×2): 25 mg via INTRAVENOUS

## 2013-01-07 MED ORDER — MIDAZOLAM HCL 5 MG/5ML IJ SOLN
INTRAMUSCULAR | Status: AC
  Filled 2013-01-07: qty 10

## 2013-01-07 MED ORDER — MIDAZOLAM HCL 5 MG/5ML IJ SOLN
INTRAMUSCULAR | Status: DC | PRN
  Administered 2013-01-07 (×3): 1 mg via INTRAVENOUS

## 2013-01-07 MED ORDER — MEPERIDINE HCL 100 MG/ML IJ SOLN
INTRAMUSCULAR | Status: AC
  Filled 2013-01-07: qty 2

## 2013-01-07 MED ORDER — PROMETHAZINE HCL 12.5 MG PO TABS: ORAL_TABLET | ORAL | Status: DC

## 2013-01-07 NOTE — Telephone Encounter (Signed)
Message copied by Glendora Score on Tue Jan 07, 2013  2:46 PM ------      Message from: West Bali      Created: Tue Jan 07, 2013  2:24 PM       Ok change to whatever her insurance co will pay for-MRI OF THE HEAD W/O CONTRAST      ----- Message -----         From: Glendora Score         Sent: 01/07/2013   2:06 PM           To: West Bali, MD            Her Insurance is not wanting to approve an CT Head that want MRI of the head w/o cm please advise?            ----- Message -----         From: West Bali, MD         Sent: 01/07/2013  12:33 PM           To: Lenoard Aden Watson            CT OF HEAD W/O CONTRAST WITHIN THE NEXT 7 DAYS, DX: NAUSEA, VOMITING, DIFFICULTY WITH BALANCE, AND INCREASED FREQUENCY OF HEADACHES             ------

## 2013-01-07 NOTE — Discharge Instructions (Signed)
I STRETCHED your esophagus. YOU HAD A RING AT THE BASE OF YOUR ESOPHAGUS. You have  gastritis MOST LIKELY DUE TO ASPIRIN USE. GASTRITIS MAY CAUSE NAUSEA AND VOMITIING.  I biopsied your stomach & SMALL BOWEL.   ALL OF THE FOLLOWING THINGS SHOULD MAKE YOUR NAUSEA, AND VOMITING BETTER:   1. CONTINUE OMEPRAZOLE. TAKE 30 MINUTES PRIOR TO BREAKFAST AND DINNER.  2. FOLLOW A STEP II OR STEP III GASTROPARESIS. SEE HANDOUT.  3. USE PHENERGAN AS NEEDED FOR NAUSEA OR VOMITING    AVOID ASPIRIN, IBUPROFEN, AND NAPROXEN FOR 2 WEEKS. USE TYLENOL AS NEEDED FOR PAIN.  YOUR BIOPSY WILL BE BACK IN 7 DAYS  YOU WILL NEED A CT OF YOUR HEAD WITHIN THE NEXT 7 DAYS.   FOLLOW UP APPT IN 1 MONTH.     UPPER ENDOSCOPY AFTER CARE Read the instructions outlined below and refer to this sheet in the next week. These discharge instructions provide you with general information on caring for yourself after you leave the hospital. While your treatment has been planned according to the most current medical practices available, unavoidable complications occasionally occur. If you have any problems or questions after discharge, call DR. Jhaniya Briski, [phone].  ACTIVITY  You may resume your regular activity, but move at a slower pace for the next 24 hours.   Take frequent rest periods for the next 24 hours.   Walking will help get rid of the air and reduce the bloated feeling in your belly (abdomen).   No driving for 24 hours (because of the medicine (anesthesia) used during the test).   You may shower.   Do not sign any important legal documents or operate any machinery for 24 hours (because of the anesthesia used during the test).    NUTRITION  Drink plenty of fluids.   You may resume your normal diet as instructed by your doctor.   Begin with a light meal and progress to your normal diet. Heavy or fried foods are harder to digest and may make you feel sick to your stomach (nauseated).   Avoid alcoholic  beverages for 24 hours or as instructed.    MEDICATIONS  You may resume your normal medications.   WHAT YOU CAN EXPECT TODAY  Some feelings of bloating in the abdomen.   Passage of more gas than usual.    IF YOU HAD A BIOPSY TAKEN DURING THE UPPER ENDOSCOPY:  Eat a soft diet IF YOU HAVE NAUSEA, BLOATING, ABDOMINAL PAIN, OR VOMITING.    FINDING OUT THE RESULTS OF YOUR TEST Not all test results are available during your visit. DR. Darrick Penna WILL CALL YOU WITHIN 7 DAYS OF YOUR PROCEDUE WITH YOUR RESULTS. Do not assume everything is normal if you have not heard from DR. Vedder Brittian IN ONE WEEK, CALL HER OFFICE AT [phone].  SEEK IMMEDIATE MEDICAL ATTENTION AND CALL THE OFFICE: [phone] IF:  You have more than a spotting of blood in your stool.   Your belly is swollen (abdominal distention).   You are nauseated or vomiting.   You have a temperature over 101F.   You have abdominal pain or discomfort that is severe or gets worse throughout the day.   Gastritis  Gastritis is an inflammation (the body's way of reacting to injury and/or infection) of the stomach.  It is often caused by bacterial (germ) infections. It can also be caused BY ASPIRIN, BC/GOODY POWDER'S, (IBUPROFEN) MOTRIN, OR ALEVE (NAPROXEN), chemicals (including alcohol), SPICY FOODS, and medications. This illness may be associated with  generalized malaise (feeling tired, not well), UPPER ABDOMINAL STOMACH cramps, and fever. One common bacterial cause of gastritis is an organism known as H. Pylori. This can be treated with antibiotics.   REFLUX  SYMPTOMS Common symptoms of GERD are heartburn (burning in your chest). This is worse when lying down or bending over. It may also cause belching and indigestion. Some of the things which make GERD worse are:  Increased weight pushes on stomach making acid rise more easily.   Smoking markedly increases acid production.   Alcohol decreases lower esophageal sphincter  pressure (valve between stomach and esophagus), allowing acid from stomach into esophagus.   Late evening meals and going to bed with a full stomach increases pressure.   Anything that causes an increase in acid production.    HOME CARE INSTRUCTIONS  Try to achieve and maintain an ideal body weight.   Avoid drinking alcoholic beverages.   DO NOT smokE.   Do not wear tight clothing around your chest or stomach.   Eat smaller meals and eat more frequently. This keeps your stomach from getting too full. Eat slowly.   Do not lie down for 2 or 3 hours after eating. Do not eat or drink anything 1 to 2 hours before going to bed.   Avoid caffeine beverages (colas, coffee, cocoa, tea), fatty foods, citrus fruits and all other foods and drinks that contain acid and that seem to increase the problems.   Avoid bending over, especially after eating OR STRAINING. Anything that increases the pressure in your belly increases the amount of acid that may be pushed up into your esophagus.    ESOPHAGEAL RING Esophageal RINGS can be caused by stomach acid backing up into the tube that carries food from the mouth down to the stomach (lower esophagus).  TREATMENT There are a number of non-prescription medicines used to treat reflux/stricture, including: Antacids.  ZANTAC Proton-pump inhibitors: OMEPRAZOLE   HOME CARE INSTRUCTIONS Eat 2-3 hours before going to bed.  Try to reach and maintain a healthy weight.  Do not eat just a few very large meals. Instead, eat 4 TO 6 smaller meals throughout the day.  Try to identify foods and beverages that make your symptoms worse, and avoid these.  Avoid tight clothing.  Do not exercise right after eating.

## 2013-01-07 NOTE — H&P (Addendum)
  Primary Care Physician:  Kirstie Peri, MD Primary Gastroenterologist:  Dr. Darrick Penna  Pre-Procedure History & Physical: HPI:  Sabrina [name] is a 79 y.o. female here for NAUSEA/VOMITING/15 lb weight [name] since JUN 2013.  Past Medical History  Diagnosis Date  . Diabetes mellitus   . Hypertension   . Stroke   . Asthma     Past Surgical History  Procedure Laterality Date  . Colectomy      secondary to diverticulitis  . Cholecystectomy      Prior to Admission medications   Medication Sig Start Date End Date Taking? Authorizing Provider  aspirin 81 MG tablet Take 81 mg by mouth daily.   Yes Historical Provider, MD  lisinopril (PRINIVIL,ZESTRIL) 20 MG tablet Take 10 mg by mouth daily.    Yes Historical Provider, MD  metoprolol (LOPRESSOR) 50 MG tablet Take 50 mg by mouth 2 (two) times daily.   Yes Historical Provider, MD  Multiple Vitamin (MULTIVITAMIN) capsule Take 1 capsule by mouth daily.   Yes Historical Provider, MD  omeprazole (PRILOSEC) 40 MG capsule Take 40 mg by mouth 2 (two) times daily.   Yes Historical Provider, MD  vitamin E 400 UNIT capsule Take 400 Units by mouth daily.   Yes Historical Provider, MD  UNABLE TO FIND Calcium Magnesium and Zink    Historical Provider, MD    Allergies as of 01/06/2013  . (No Known Allergies)    Family History  Problem Relation Age of Onset  . Colon cancer Neg Hx     History   Social History  . Marital Status: Married    Spouse Name: N/A    Number of Children: N/A  . Years of Education: N/A   Occupational History  . Not on file.   Social History Main Topics  . Smoking status: Never Smoker   . Smokeless tobacco: Not on file  . Alcohol Use: No  . Drug Use: No  . Sexually Active: Not on file   Other Topics Concern  . Not on file   Social History Narrative  . No narrative on file    Review of Systems: See HPI, otherwise negative ROS   Physical Exam: BP 146/71  Pulse 59  Temp(Src) 97.4 F (36.3 C) (Oral)  Resp  15  Ht 5\' 3"  (1.6 m)  Wt 121 lb (54.885 kg)  BMI 21.44 kg/m2  SpO2 98% General:   Alert,  pleasant and cooperative in NAD Head:  Normocephalic and atraumatic. Neck:  Supple; Lungs:  Clear throughout to auscultation.    Heart:  Regular rate and rhythm. Abdomen:  Soft, nontender and nondistended. Normal bowel sounds, without guarding, and without rebound.   Neurologic:  Alert and  oriented x4;  grossly normal neurologically.  Impression/Plan:     NAUSEA/VOMITING/weight [name].  PLAN: EGD TODAY

## 2013-01-07 NOTE — Telephone Encounter (Signed)
Patient is scheduled on Friday March 28th at 9:00 and I have informed patient

## 2013-01-07 NOTE — Op Note (Signed)
[facility] [address] [place], [address]   ENDOSCOPY PROCEDURE REPORT  PATIENT: [name], [name]  MR#: [id number] BIRTHDATE: Apr 14, 1932 , 80  yrs. old GENDER: Female  ENDOSCOPIST: Jonette Eva, MD REFFERED AO:ZHYQMV Sherryll Burger, M.D. PROCEDURE DATE:  01/07/2013 PROCEDURE:   EGD with biopsy for H.  pylori & celiac sprue, EGD with dilatation over guidewire INDICATIONS:1.  nausea and vomiting.   2.  weight loss: 15 lbs since JUN 2013. HEADACHES WORSE SINCE JAN 2014 AND FREQUENT DIFFICULTY WALKING. MEDICATIONS: Demerol 50 mg IV and Versed 5 mg IV TOPICAL ANESTHETIC: Cetacaine Spray DESCRIPTION OF PROCEDURE:   After the risks benefits and alternatives of the procedure were thoroughly explained, informed consent was obtained.  The EG-[id number] ([id number])  endoscope was introduced through the mouth and advanced to the second portion of the duodenum. The instrument was slowly withdrawn as the mucosa was carefully examined.  Prior to withdrawal of the scope, the guidwire was placed.  The esophagus was dilated successfully.  The patient was recovered in endoscopy and discharged home in satisfactory condition.   DUODENUM: A medium sized diverticulum was found in the 2nd part of the duodenum.  Multiple biopsies were performed INTHE 2ND PART OF THE DUODENUM using cold forceps.  Sample obtained for evaluation of celiac sprue.   The duodenal mucosa showed no abnormalities in the duodenal bulb.   ESOPHAGUS: A moderately severe Schatzki ring was found at the gastroesophageal junction. STOMACH: Moderate non-erosive gastritis (inflammation) was found in the entire examined stomach.  Multiple biopsies were performed using cold forceps.   Dilation was then performed at the gastroesphageal junction  Dilator: Savary over guidewire Size(s): 14-16 MM Resistance: minimal TO MODERATE Heme: yes  COMPLICATIONS: There were no complications.  ENDOSCOPIC IMPRESSION: 1.   NAUSEA AND VOMITING &  WEIGHT LOSS MOST LIKELY DUE TO GERD/H PYLORI GASTRITIS AND/OR GASTROPARESIS, LESS LIKELY CNS PROCESS OR CELIAC SPRUE 2.   Schatzki ring was found at the gastroesophageal junction 3.  SINGLE DUODENAL DIVERTICULUM  RECOMMENDATIONS: MRI OF BRAIN W/O CONTRAST-INSURANCE DENIED CT SCAN OF HEAD CONTINUE OMEPRAZOLE. TAKE 30 MINUTES PRIOR TO BREAKFAST AND DINNER. FOLLOW A STEP II OR STEP III GASTROPARESIS. USE PHENERGAN AS NEEDED FOR NAUSEA OR VOMITING AVOID ASPIRIN, IBUPROFEN, AND NAPROXEN FOR 2 WEEKS. USE TYLENOL AS NEEDED FOR PAIN. BIOPSY WILL BE BACK IN 7 DAYS FOLLOW UP APPT IN 1 MONTH.      _______________________________ Rosalie DoctorJonette Eva, MD 01/07/2013 2:36 PM      PATIENT NAME:  Sabrina [name], Sabrina [name] MR#: [id number]

## 2013-01-07 NOTE — Telephone Encounter (Signed)
Patient wanted to R/S to Monday March 31st at 9:00 am

## 2013-01-07 NOTE — Telephone Encounter (Signed)
REVIEWED.  

## 2013-01-10 ENCOUNTER — Other Ambulatory Visit (HOSPITAL_COMMUNITY): Payer: Medicare Other

## 2013-01-10 ENCOUNTER — Encounter (HOSPITAL_COMMUNITY): Payer: Self-pay | Admitting: Gastroenterology

## 2013-01-13 ENCOUNTER — Ambulatory Visit (HOSPITAL_COMMUNITY)
Admission: RE | Admit: 2013-01-13 | Discharge: 2013-01-13 | Disposition: A | Discharge status: Home / Self Care | Payer: Medicare Other | Source: Ambulatory Visit | Attending: Gastroenterology | Admitting: Gastroenterology

## 2013-01-13 DIAGNOSIS — R112 Nausea with vomiting, unspecified: Secondary | ICD-10-CM | POA: Insufficient documentation

## 2013-01-13 DIAGNOSIS — R42 Dizziness and giddiness: Secondary | ICD-10-CM | POA: Insufficient documentation

## 2013-01-13 DIAGNOSIS — R2689 Other abnormalities of gait and mobility: Secondary | ICD-10-CM

## 2013-01-13 DIAGNOSIS — Z8673 Personal history of transient ischemic attack (TIA), and cerebral infarction without residual deficits: Secondary | ICD-10-CM | POA: Insufficient documentation

## 2013-01-15 ENCOUNTER — Telehealth: Payer: Self-pay | Admitting: Gastroenterology

## 2013-01-15 MED ORDER — AMOXICILLIN 500 MG PO TABS: ORAL_TABLET | ORAL | Status: DC

## 2013-01-15 MED ORDER — CLARITHROMYCIN 500 MG PO TABS: ORAL_TABLET | ORAL | Status: DC

## 2013-01-15 NOTE — Telephone Encounter (Signed)
Called patient TO DISCUSS RESULTS. SPOKE TO HER HUSBAND.  Her stomach Bx showed H. Pylori infection. She needs AMOXICILLIN 500 mg 2 po BID for 10 days and Biaxin 500 mg po bid for 10 days. CONTINUE OMEPRAZOLE BID.  Med side effects include NVD, abd pain, and metallic taste. PT'S HUSBAND ASKED THAT DR. SHAH WRITE FOR MEDS BECAUSE SHE WON' HAVE TO PAY FOR GENERIC MEDS IF WRITTEN BY PCP. WILL CALL [facility]. [facility] TOMORROW AND ASK HIM TO CALL IN MEDS.  EXPLAINED RESULTS FOR THE MRI. NO LESION IN POSTERIOR CIRCULATION. PROBLEMS WITH BALANCE UNKNOWN ETIOLOGY & NOT LIKELY RELATED TO NV. RECOMMENDED PT SEE NEUROLOGY.  THEY WILL CALL AND LET ME KNOW. HE STATED THEY DOT PLAN DO ANYTHING UNTIL AFTER APR 14.

## 2013-01-16 NOTE — Telephone Encounter (Signed)
CALLED PCP. EXPLAINED RESULTS. HE WILL SEND RX FOR AMOX 1 GM BID FOR 10 DAYS AND BIAXIN 500 MG BID FOR 10 DAYS. PT ALREADY TAKING OMP BID.

## 2013-01-16 NOTE — Telephone Encounter (Signed)
Called and told the pt's husband and he said that he has already gotten the prescriptions ( all 3 ) from [place] and there was no charge, but they did have Dr. Darrick Penna name on all three.

## 2013-01-16 NOTE — Telephone Encounter (Signed)
PLEASE CALL PT'S HUSBAND. LET HIM KNOW DR. VYAS WILL SEND RX FOR HER ABX. DR. Darrick Penna PERSONALLY SPOKE WITH CA TO CANCEL RX SENT BY HER.

## 2013-01-16 NOTE — Telephone Encounter (Signed)
CC PCP 

## 2013-01-16 NOTE — Telephone Encounter (Signed)
REVIEWED.  

## 2013-01-16 NOTE — Addendum Note (Signed)
Addended by: [place] Bali on: 01/16/2013 01:50 PM   Modules accepted: Orders, Medications

## 2013-02-10 ENCOUNTER — Encounter: Payer: Self-pay | Admitting: Gastroenterology

## 2013-02-12 ENCOUNTER — Ambulatory Visit: Payer: Medicare Other | Admitting: Gastroenterology

## 2013-02-17 IMAGING — US US EXTREM LOW VENOUS*L*
1 series · 14 of 24 positions shown · non-contrast
Comparison: None.

CLINICAL DATA: Left calf pain.  History of left hip replacement.

LEFT LOWER EXTREMITY VENOUS DUPLEX ULTRASOUND
TECHNIQUE: Gray-scale sonography with graded compression, as well
as color Doppler and duplex ultrasound, were performed to evaluate
the deep venous system of the lower extremity from the level of the
common femoral vein through the popliteal and proximal calf veins.
Spectral Doppler was utilized to evaluate flow at rest and with
distal augmentation maneuvers.

[Series 1: us extrem low venous*left* · 14 of 29 slices shown]
[im 1/29]
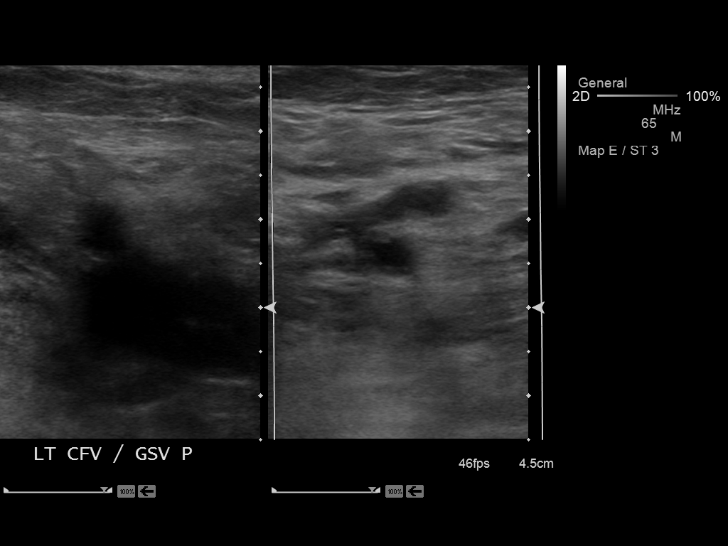
[im 3/29]
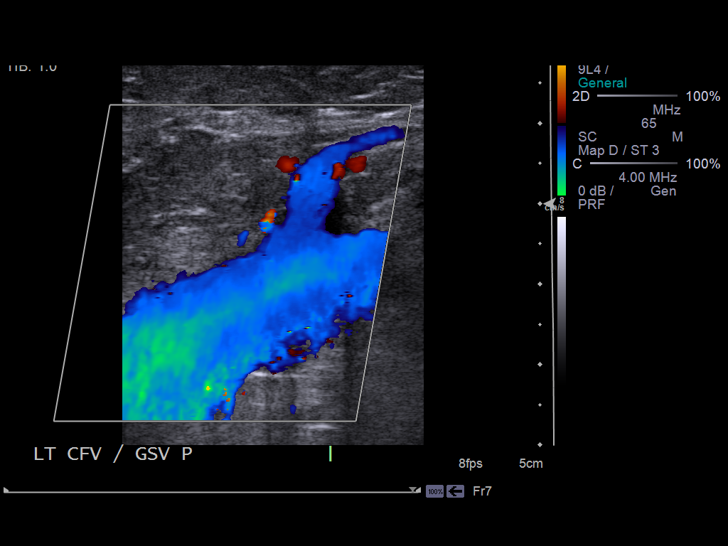
[im 5/29]
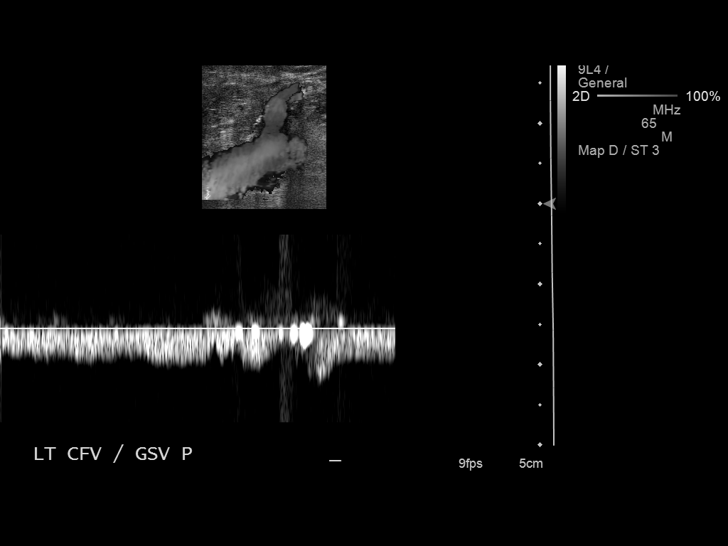
[im 8/29]
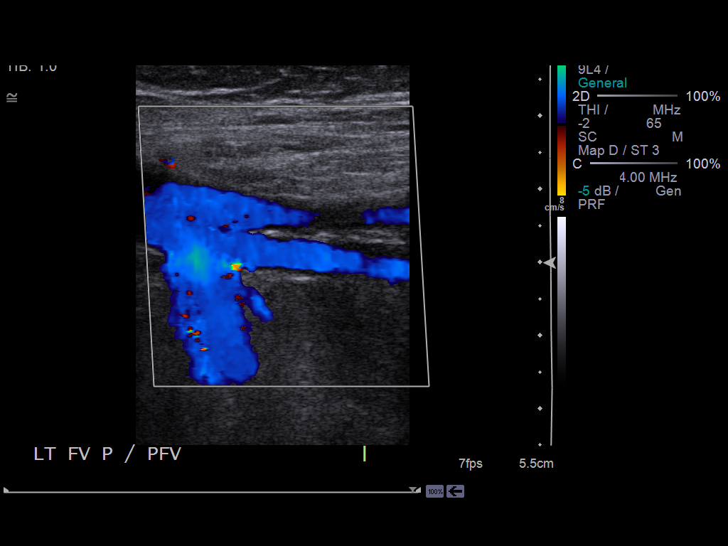
[im 9/29]
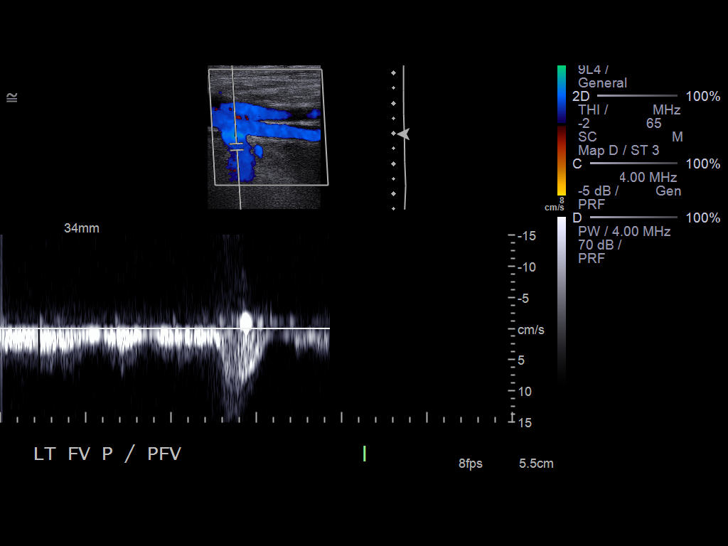
[im 11/29]
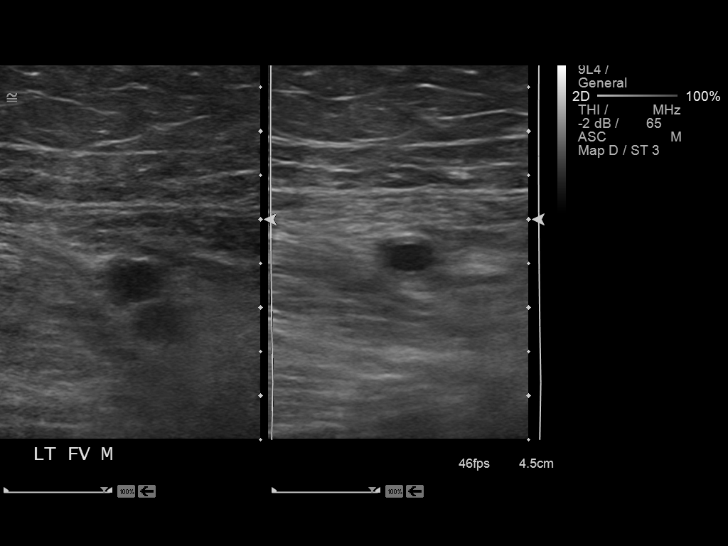
[im 14/29]
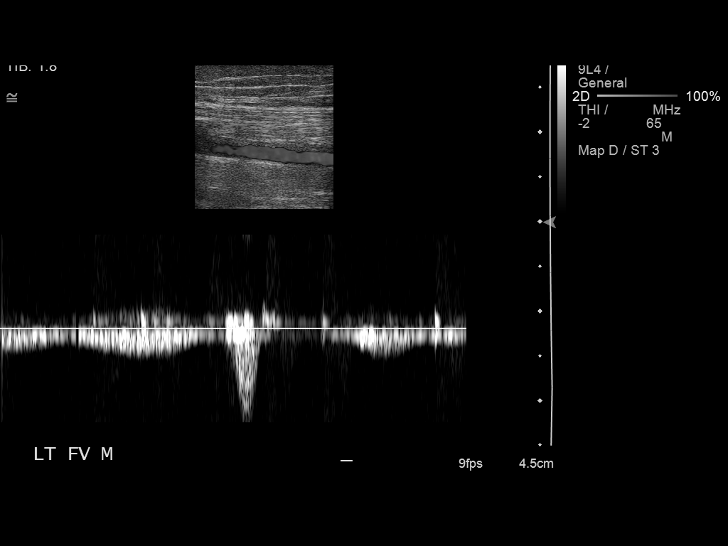
[im 15/29]
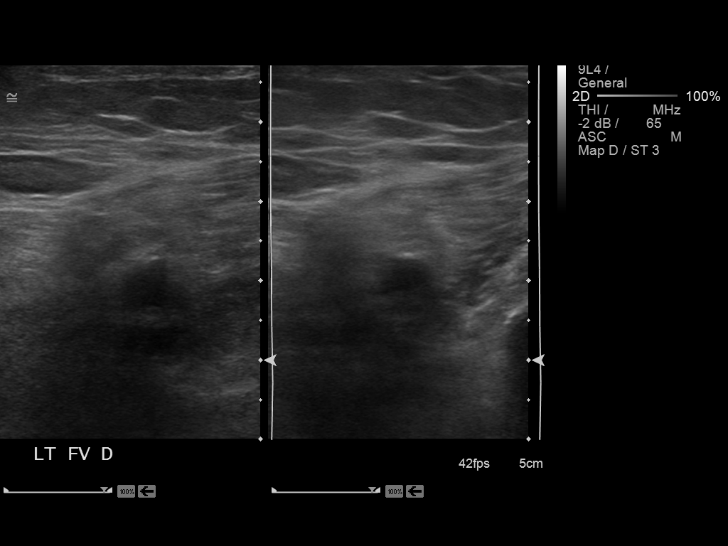
[im 18/29]
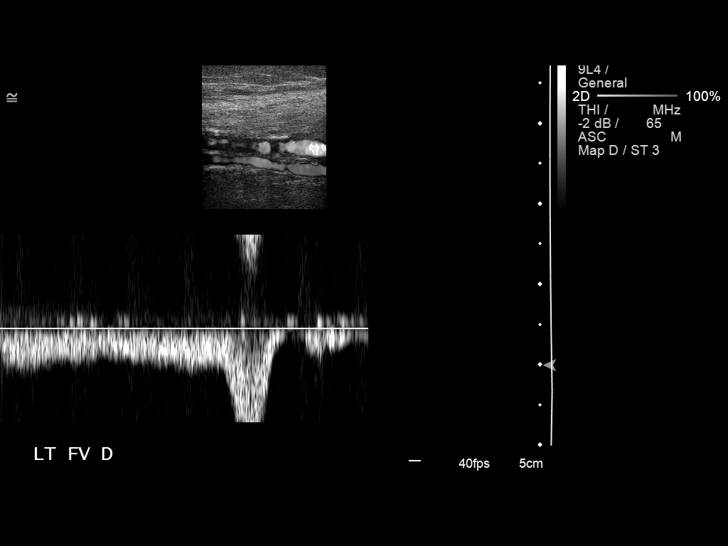
[im 20/29]
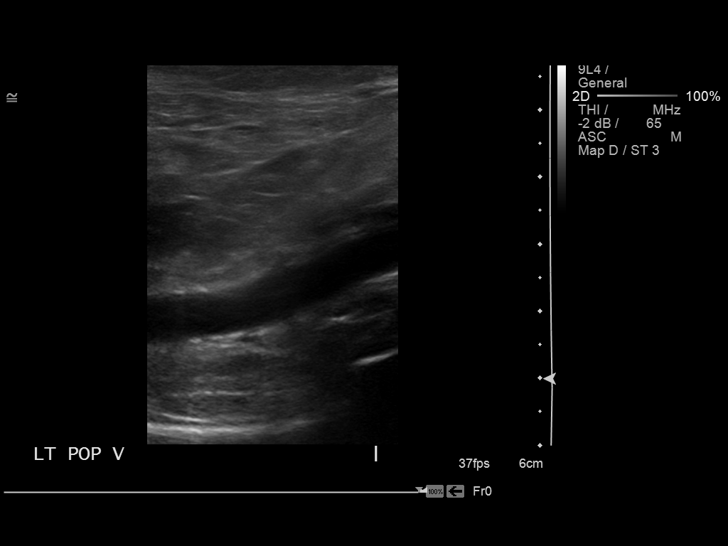
[im 22/29]
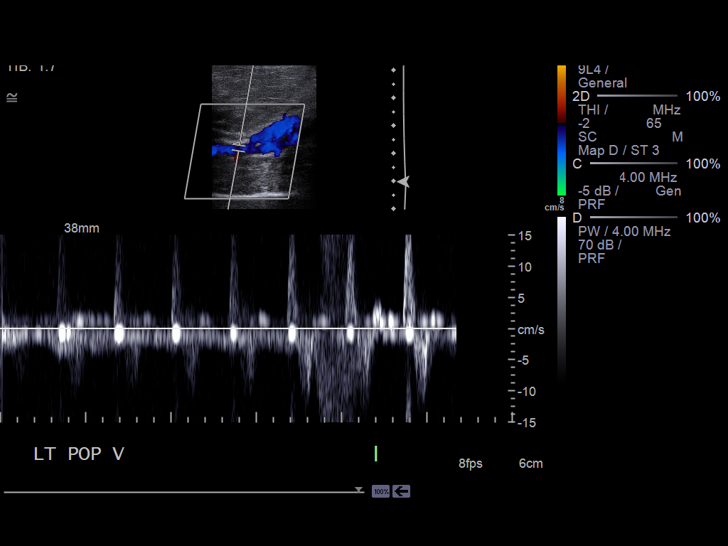
[im 24/29]
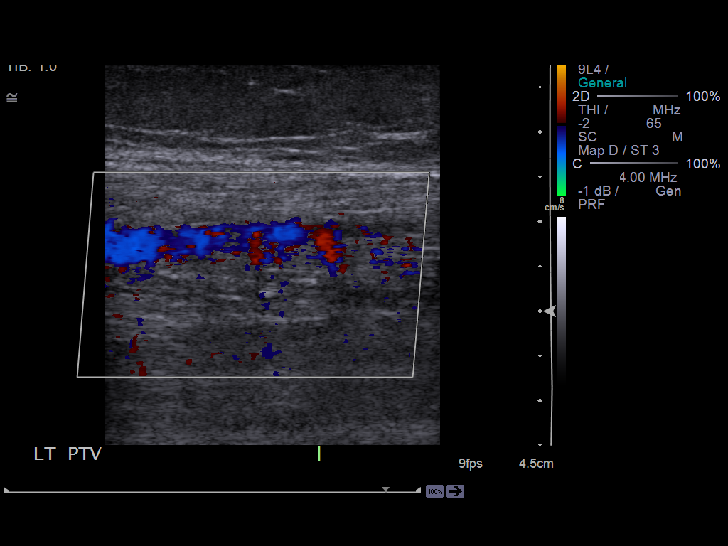
[im 26/29]
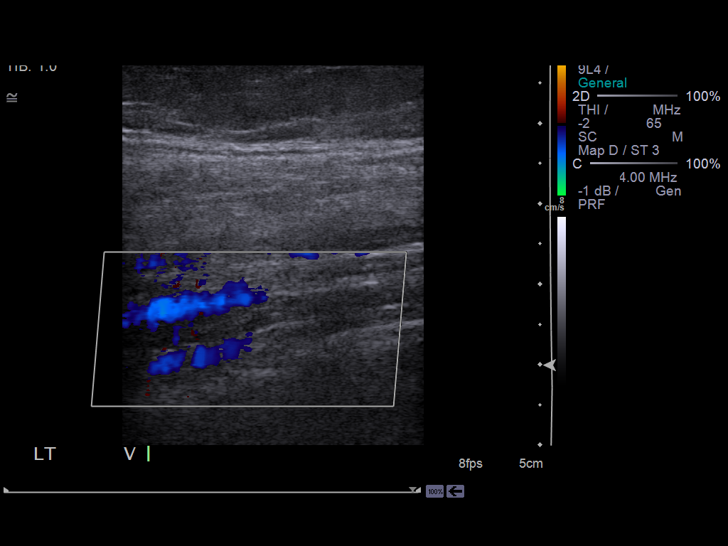
[im 29/29]
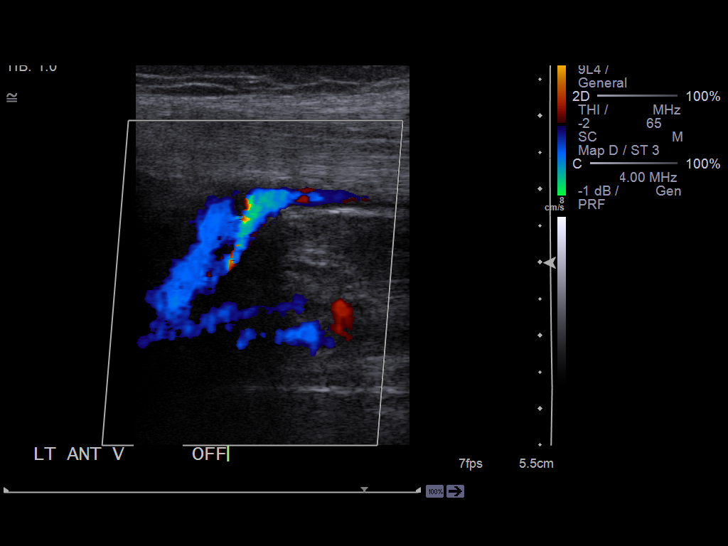

[14 of 24 positions shown; findings below may reference images not displayed]

FINDINGS: There is no evidence of DVT in the lower extremity.
Normal phasicity, augmentation, and compressibility in the
saphenofemoral junction, common femoral vein, femoral vein, deep
femoral vein, and popliteal vein.
IMPRESSION: Negative left lower extremity DVT study.

## 2013-02-19 ENCOUNTER — Encounter: Payer: Self-pay | Admitting: Gastroenterology

## 2013-02-19 ENCOUNTER — Ambulatory Visit (INDEPENDENT_AMBULATORY_CARE_PROVIDER_SITE_OTHER): Payer: Medicare Other | Admitting: Gastroenterology

## 2013-02-19 VITALS — BP 164/85 | HR 60 | Temp 97.6°F | Ht 63.0 in | Wt 121.6 lb

## 2013-02-19 DIAGNOSIS — R112 Nausea with vomiting, unspecified: Secondary | ICD-10-CM

## 2013-02-19 DIAGNOSIS — M799 Soft tissue disorder, unspecified: Secondary | ICD-10-CM

## 2013-02-19 DIAGNOSIS — R293 Abnormal posture: Secondary | ICD-10-CM | POA: Insufficient documentation

## 2013-02-19 MED ORDER — PROMETHAZINE HCL 12.5 MG PO TABS: ORAL_TABLET | ORAL | Status: DC

## 2013-02-19 NOTE — Assessment & Plan Note (Signed)
IMPROVED WITH PRILOSEC AND PHENERGAN.  CONTINUE PRILOSEC HUSBAND WOULD LIKE PHENERGAN REFILL. DISCUSSED MED SIDE EFFECTS. REDUCE DOSE IF SIDE EFFECT OCCUR. OPV IN 4 MOS

## 2013-02-19 NOTE — Progress Notes (Signed)
Reminder in epic °

## 2013-02-19 NOTE — Assessment & Plan Note (Signed)
DECLINED NEUROLOGY EVAL AT THIS TIME. [facility] TO PERFORM HOME PT/OT EVAL & FALL RISK ASSESSMENT

## 2013-02-19 NOTE — Patient Instructions (Signed)
USE PHENERGAN PRIOR TO MEALS UP TO THREE TIMES A DAY.  CONTINUE PRILOSEC.  FOLLOW UP IN 3 MOS.

## 2013-02-19 NOTE — Progress Notes (Signed)
  Subjective:    Patient ID: Sabrina [name], female    DOB: [birth year], 79 y.o.   MRN: [id number]  Pcp: [facility]  HPI Using Phenergan once daily and it helps. Doesn't make her droqsy. Taking Prilosec bid. Sx worse with stress. May have diarrhea and vomiting WITH STRESS. GAINED 3 LBS SINCE LAST VISIT. EATING BETTER SINCE EGD/DIL. BALANCE  IS STILL OFF. HAS HAD FALL RISK EVALUATION > 4 YEARS.   Past Medical History  Diagnosis Date  . Diabetes mellitus   . Hypertension   . Stroke   . Asthma    Past Surgical History  Procedure Laterality Date  . Colectomy      secondary to diverticulitis  . Cholecystectomy    . Esophagogastroduodenoscopy (egd) with esophageal dilation N/A 01/07/2013    WUJ:[id number] ring was found at the gastroesophageal junction/SINGLE DUODENAL DIVERTICULUM   No Known Allergies  Current Outpatient Prescriptions  Medication Sig Dispense Refill  . Ascorbic Acid (VITAMIN C) 1000 MG tablet Take 1,000 mg by mouth daily.      Marland Kitchen aspirin 81 MG tablet Take 81 mg by mouth 2 (two) times daily.      Marland Kitchen CALCIUM-MAGNESIUM-ZINC PO Take by mouth daily.      . citalopram (CELEXA) 10 MG tablet Take 10 mg by mouth daily.       Marland Kitchen lisinopril (PRINIVIL,ZESTRIL) 20 MG tablet Take 10 mg by mouth daily.       . metoprolol (LOPRESSOR) 50 MG tablet Take 50 mg by mouth 2 (two) times daily.      . Multiple Vitamin (MULTIVITAMIN) capsule Take 1 capsule by mouth daily.      Marland Kitchen omeprazole (PRILOSEC) 40 MG capsule Take 40 mg by mouth 2 (two) times daily.      . promethazine (PHENERGAN) 12.5 MG tablet 1 po q6h prn nausea or vomiting    . VITAMIN B COMPLEX-C PO Take by mouth daily.      . vitamin E 400 UNIT capsule Take 400 Units by mouth daily.          Review of Systems     Objective:   Physical Exam  Vitals reviewed. Constitutional: She appears well-nourished. No distress.  HENT:  Head: Normocephalic and atraumatic.  Mouth/Throat: Oropharynx is clear and moist. No oropharyngeal exudate.   Eyes: Pupils are equal, round, and reactive to light. No scleral icterus.  Neck: Normal range of motion. Neck supple.  Cardiovascular: Normal rate, regular rhythm and normal heart sounds.   Pulmonary/Chest: Effort normal and breath sounds normal. No respiratory distress.  Abdominal: Soft. Bowel sounds are normal. She exhibits no distension. There is no tenderness.  EXAM LIMITED-PT IN CHAIR   Neurological: She is alert.  NO  NEW FOCAL DEFICITS   Psychiatric:  FLAT AFFECT, NL MOOD           Assessment & Plan:

## 2013-02-19 NOTE — Progress Notes (Signed)
Cc PCP 

## 2013-04-03 ENCOUNTER — Encounter (HOSPITAL_COMMUNITY): Payer: Self-pay | Admitting: *Deleted

## 2013-04-03 ENCOUNTER — Emergency Department (HOSPITAL_COMMUNITY): Payer: Medicare Other

## 2013-04-03 ENCOUNTER — Emergency Department (HOSPITAL_COMMUNITY)
Admission: EM | Admit: 2013-04-03 | Discharge: 2013-04-03 | Disposition: A | Discharge status: Home / Self Care | Payer: Medicare Other | Attending: Emergency Medicine | Admitting: Emergency Medicine

## 2013-04-03 DIAGNOSIS — Z79899 Other long term (current) drug therapy: Secondary | ICD-10-CM | POA: Insufficient documentation

## 2013-04-03 DIAGNOSIS — Z7982 Long term (current) use of aspirin: Secondary | ICD-10-CM | POA: Insufficient documentation

## 2013-04-03 DIAGNOSIS — S42213A Unspecified displaced fracture of surgical neck of unspecified humerus, initial encounter for closed fracture: Secondary | ICD-10-CM | POA: Insufficient documentation

## 2013-04-03 DIAGNOSIS — I10 Essential (primary) hypertension: Secondary | ICD-10-CM | POA: Insufficient documentation

## 2013-04-03 DIAGNOSIS — E119 Type 2 diabetes mellitus without complications: Secondary | ICD-10-CM | POA: Insufficient documentation

## 2013-04-03 DIAGNOSIS — Z8673 Personal history of transient ischemic attack (TIA), and cerebral infarction without residual deficits: Secondary | ICD-10-CM | POA: Insufficient documentation

## 2013-04-03 DIAGNOSIS — Y929 Unspecified place or not applicable: Secondary | ICD-10-CM | POA: Insufficient documentation

## 2013-04-03 DIAGNOSIS — S42202A Unspecified fracture of upper end of left humerus, initial encounter for closed fracture: Secondary | ICD-10-CM

## 2013-04-03 DIAGNOSIS — S0003XA Contusion of scalp, initial encounter: Secondary | ICD-10-CM | POA: Insufficient documentation

## 2013-04-03 DIAGNOSIS — W010XXA Fall on same level from slipping, tripping and stumbling without subsequent striking against object, initial encounter: Secondary | ICD-10-CM | POA: Insufficient documentation

## 2013-04-03 DIAGNOSIS — J45909 Unspecified asthma, uncomplicated: Secondary | ICD-10-CM | POA: Insufficient documentation

## 2013-04-03 DIAGNOSIS — Y9389 Activity, other specified: Secondary | ICD-10-CM | POA: Insufficient documentation

## 2013-04-03 DIAGNOSIS — S0083XA Contusion of other part of head, initial encounter: Secondary | ICD-10-CM

## 2013-04-03 MED ORDER — HYDROCODONE-ACETAMINOPHEN 5-325 MG PO TABS: 2.0000 | ORAL_TABLET | ORAL | Status: DC | PRN

## 2013-04-03 MED ORDER — HYDROCODONE-ACETAMINOPHEN 5-325 MG PO TABS
1.0000 | ORAL_TABLET | Freq: Once | ORAL | Status: AC
  Administered 2013-04-03: 1 via ORAL
  Filled 2013-04-03: qty 1

## 2013-04-03 NOTE — ED Notes (Signed)
Fell at home, pain in left shoulder radiating into left elbow

## 2013-04-03 NOTE — ED Provider Notes (Signed)
History  This chart was scribed for Loren Racer, MD by Bennett Scrape, ED Scribe. This patient was seen in room APA05/APA05 and the patient's care was started at 11:30 AM.  CSN: [id number]  Arrival date & time 04/03/13  1125   First MD Initiated Contact with Patient 04/03/13 1130      Chief Complaint  Patient presents with  . Fall     Patient is a 79 y.o. female presenting with fall. The history is provided by the patient. No language interpreter was used.  Fall This is a new problem. The current episode started yesterday. Episode frequency: x1. Pertinent negatives include no headaches. Nothing aggravates the symptoms. Nothing relieves the symptoms. She has tried nothing for the symptoms.    HPI Comments: Sabrina [name] is a 79 y.o. female who presents to the Emergency Department complaining of a slip and fall at home yesterday. She states that she was trying to put a slipper back on her foot when she slipped and fell landing on her left arm. She reports associated left shoulder pain that radiates into the left elbow. She reports head trauma but denies LOC. She states that she took one Excedrin at home with minimal improvement. At baseline, she ambulates with a walker and denies any changes in ambulation since the fall. She has ongoing emesis but denies any changes. She denies HA, back pain and neck pain as associated symptoms. She has a h/o DM, HTN and CVA. Pt denies smoking and alcohol use. She is currently on ASA but is not on any anticoagulants.    Past Medical History  Diagnosis Date  . Diabetes mellitus   . Hypertension   . Stroke   . Asthma     Past Surgical History  Procedure Laterality Date  . Colectomy      secondary to diverticulitis  . Cholecystectomy    . Esophagogastroduodenoscopy (egd) with esophageal dilation N/A 01/07/2013    JXB:[id number] ring was found at the gastroesophageal junction/SINGLE DUODENAL DIVERTICULUM    Family History  Problem Relation Age  of Onset  . Colon cancer Neg Hx     History  Substance Use Topics  . Smoking status: Never Smoker   . Smokeless tobacco: Not on file  . Alcohol Use: No    No OB history provided.  Review of Systems  HENT: Negative for neck pain.   Musculoskeletal: Positive for arthralgias. Negative for back pain.  Neurological: Negative for syncope and headaches.  All other systems reviewed and are negative.    Allergies  Review of patient's allergies indicates no known allergies.  Home Medications   Current Outpatient Rx  Name  Route  Sig  Dispense  Refill  . Ascorbic Acid (VITAMIN C) 1000 MG tablet   Oral   Take 1,000 mg by mouth daily.         Marland Kitchen aspirin 81 MG tablet   Oral   Take 81 mg by mouth 2 (two) times daily.         Marland Kitchen CALCIUM-MAGNESIUM-ZINC PO   Oral   Take by mouth daily.         . citalopram (CELEXA) 10 MG tablet   Oral   Take 10 mg by mouth daily.          Marland Kitchen glucose blood (PRODIGY TEST) test strip   Other   1 each by Other route as needed for other. Use as instructed         . lisinopril (PRINIVIL,ZESTRIL) 20 MG  tablet   Oral   Take 10 mg by mouth daily.          . metoprolol (LOPRESSOR) 50 MG tablet   Oral   Take 50 mg by mouth 2 (two) times daily.         . Multiple Vitamin (MULTIVITAMIN) capsule   Oral   Take 1 capsule by mouth daily.         Marland Kitchen omeprazole (PRILOSEC) 40 MG capsule   Oral   Take 40 mg by mouth 2 (two) times daily.         . promethazine (PHENERGAN) 12.5 MG tablet      1 po 30 MINS PRIOR TO MEALS TID   90 tablet   11   . VITAMIN B COMPLEX-C PO   Oral   Take by mouth daily.         . vitamin E 400 UNIT capsule   Oral   Take 400 Units by mouth daily.         Marland Kitchen HYDROcodone-acetaminophen (NORCO) 5-325 MG per tablet   Oral   Take 2 tablets by mouth every 4 (four) hours as needed for pain.   10 tablet   0     Triage Vitals: BP 159/62  Pulse 66  Temp(Src) 97.9 F (36.6 C) (Oral)  Resp 20  SpO2  96%  Physical Exam  Nursing note and vitals reviewed. Constitutional: She is oriented to person, place, and time. She appears well-developed and well-nourished. No distress.  HENT:  Head: Normocephalic.  Large contusion to the left forehead  Eyes: EOM are normal.  Neck: Neck supple. No tracheal deviation present.  No posterior cervical tenderness  Cardiovascular: Normal rate.   Pulmonary/Chest: Effort normal. No respiratory distress.  Musculoskeletal: Normal range of motion.  Good distal pulses, large contusion to the left bicep, mild tenderness to the left shoulder, no deformity, no tenderness at the left elbow or wrist  Neurological: She is alert and oriented to person, place, and time.  Pt is ambulating with a walker  Skin: Skin is warm and dry.  Psychiatric: She has a normal mood and affect. Her behavior is normal.    ED Course  Procedures (including critical care time)  DIAGNOSTIC STUDIES: Oxygen Saturation is 96% on room air, normal by my interpretation.    COORDINATION OF CARE: 11:44 AM-Discussed treatment plan which includes CT of head and xray of left humerus with pt at bedside and pt agreed to plan.   Labs Reviewed - No data to display  Ct Head Wo Contrast  04/03/2013   *RADIOLOGY REPORT*  Clinical Data: Fall 04/03/2013.  Diabetic hypertensive patient. History stroke.  CT HEAD WITHOUT CONTRAST  Technique:  Contiguous axial images were obtained from the base of the skull through the vertex without contrast.  Comparison: None.  Findings: No skull fracture or intracranial hemorrhage.  Small vessel disease type changes without CT evidence of large acute infarct.  Atrophy without hydrocephalus.  No intracranial mass lesion detected on this unenhanced exam.  Vascular calcifications.  Mastoid air cells, middle ear cavities and paranasal sinuses are clear.  IMPRESSION: No skull fracture or intracranial hemorrhage.  Small vessel disease type changes without CT evidence of large  acute infarct.  Atrophy without hydrocephalus.   Original Report Authenticated By: Lacy Duverney, M.D.   Dg Humerus Left  04/03/2013   *RADIOLOGY REPORT*  Clinical Data: Left shoulder pain, fell 1 day ago, history diabetes, hypertension, asthma, stroke  LEFT HUMERUS - 2+ VIEW  Comparison: None.  Findings: Osseous demineralization. Mildly displaced fracture at surgical neck left humerus. Remainder humerus appears intact. Shoulder and elbow joint alignments appear grossly normal on AP humeral exam. AC joint alignment normal. No bone destruction.  IMPRESSION: Mildly displaced fracture at surgical neck left humerus. Osseous demineralization.   Original Report Authenticated By: Ulyses Southward, M.D.     1. Proximal humerus fracture, left, closed, initial encounter   2. Forehead contusion, initial encounter       MDM  I personally performed the services described in this documentation, which was scribed in my presence. The recorded information has been reviewed and is accurate.  Pt advised to f/u with her orthopedist at [facility] in 1 week. Return precautions given.   Loren Racer, MD 04/03/13 1320

## 2013-04-05 ENCOUNTER — Encounter: Payer: Self-pay | Admitting: Gastroenterology

## 2013-04-05 NOTE — Progress Notes (Signed)
REVIEWED.  EGD/DIL MAR 2014 H PYLORI GASTRIITIS

## 2013-04-07 ENCOUNTER — Telehealth: Payer: Self-pay

## 2013-04-07 NOTE — Telephone Encounter (Signed)
Orders faxed

## 2013-04-07 NOTE — Telephone Encounter (Signed)
T/C from Tiffany at [facility]. She said she has faxed some orders for Dr. Darrick Penna to sign and needs them faxed over. Orders were in Dr. Evelina Dun office and I am faxing them to Endo for her to sign.

## 2013-04-08 ENCOUNTER — Encounter: Payer: Self-pay | Admitting: Orthopedic Surgery

## 2013-04-08 ENCOUNTER — Ambulatory Visit (INDEPENDENT_AMBULATORY_CARE_PROVIDER_SITE_OTHER): Payer: Medicare Other | Admitting: Orthopedic Surgery

## 2013-04-08 VITALS — BP 176/90 | Ht 63.0 in | Wt 121.0 lb

## 2013-04-08 DIAGNOSIS — S42209A Unspecified fracture of upper end of unspecified humerus, initial encounter for closed fracture: Secondary | ICD-10-CM

## 2013-04-08 DIAGNOSIS — S42202A Unspecified fracture of upper end of left humerus, initial encounter for closed fracture: Secondary | ICD-10-CM

## 2013-04-08 MED ORDER — HYDROCODONE-ACETAMINOPHEN 5-325 MG PO TABS: 2.0000 | ORAL_TABLET | ORAL | Status: DC | PRN

## 2013-04-08 NOTE — Patient Instructions (Signed)
Wear sling take pain medication

## 2013-04-08 NOTE — Progress Notes (Signed)
Patient ID: Sabrina [name], female   DOB: [birth year], 79 y.o.   MRN: [id number] Chief Complaint  Patient presents with  . Arm Pain    Left humerus fracture DOI 04/02/13    History this patient has had several falls in the last 2 years presents with pain and swelling of the left upper arm after left proximal humerus fracture. She was treated in the emergency room complains of throbbing constant pain with some tingling and bruising as well as swelling in the left upper arm. Pain is worse at night but occurs day and night is worse when she's getting dressed and trying to move her arm  She is had a stroke in 2010 has no known drug allergies to 85% colectomy she's had a cholecystectomy she has a family history of arthritis and diabetes she is married retired  History of persistent nausea vomiting cough diarrhea frequency urgency joint pain instability unsteady gait and dizziness  BP 176/90  Ht 5\' 3"  (1.6 m)  Wt 121 lb (54.885 kg)  BMI 21.44 kg/m2 General appearance is normal, the patient is alert and oriented x3 with normal mood and affect. Ambulation is with a walker with wheels but she is now using one-handed she did not want a platform walker with wheels which was offered to her  Left arm is swollen and tender especially in the proximal aspect there is ecchymosis which tracks into the chest wall and left upper breast area. She does essentially no active range of motion in the shoulder normal and the elbow wrist and hand but painful. Elbow wrist and hand stable shoulder not tested secondary to fracture. Muscle tone normal skin ecchymotic but intact pulses good distally and sensation remains intact  The x-ray shows proximal humerus fracture minimal displacement. Meets nonoperative care criteria  Continue with sling shot is x3 weeks and repeat x-ray

## 2013-04-09 ENCOUNTER — Other Ambulatory Visit: Payer: Self-pay | Admitting: *Deleted

## 2013-04-09 ENCOUNTER — Telehealth: Payer: Self-pay | Admitting: *Deleted

## 2013-04-09 DIAGNOSIS — S42202A Unspecified fracture of upper end of left humerus, initial encounter for closed fracture: Secondary | ICD-10-CM

## 2013-04-09 NOTE — Telephone Encounter (Signed)
Faxed referral for Home Health

## 2013-04-29 ENCOUNTER — Telehealth: Payer: Self-pay | Admitting: *Deleted

## 2013-04-29 ENCOUNTER — Ambulatory Visit (INDEPENDENT_AMBULATORY_CARE_PROVIDER_SITE_OTHER): Payer: Medicare Other | Admitting: Orthopedic Surgery

## 2013-04-29 ENCOUNTER — Encounter: Payer: Self-pay | Admitting: Orthopedic Surgery

## 2013-04-29 ENCOUNTER — Ambulatory Visit (INDEPENDENT_AMBULATORY_CARE_PROVIDER_SITE_OTHER): Payer: Medicare Other

## 2013-04-29 VITALS — BP 142/70 | Ht 63.0 in | Wt 121.0 lb

## 2013-04-29 DIAGNOSIS — S42202D Unspecified fracture of upper end of left humerus, subsequent encounter for fracture with routine healing: Secondary | ICD-10-CM

## 2013-04-29 DIAGNOSIS — IMO0001 Reserved for inherently not codable concepts without codable children: Secondary | ICD-10-CM

## 2013-04-29 DIAGNOSIS — S4292XD Fracture of left shoulder girdle, part unspecified, subsequent encounter for fracture with routine healing: Secondary | ICD-10-CM

## 2013-04-29 DIAGNOSIS — S42309D Unspecified fracture of shaft of humerus, unspecified arm, subsequent encounter for fracture with routine healing: Secondary | ICD-10-CM

## 2013-04-29 NOTE — Telephone Encounter (Signed)
Faxed referral and office notes for Physical Therapy. Awaiting response.

## 2013-04-29 NOTE — Patient Instructions (Signed)
Home Physical Therapy Ordered

## 2013-04-29 NOTE — Progress Notes (Signed)
Patient ID: Sabrina [name], female   DOB: Aug 11, 1932, 79 y.o.   MRN: [id number] Fracture care  Left proximal humerus fracture  Fracture occurred 2 weeks ago  X-ray shows good consolidation of the fracture no change in position  Patient neurovascular exam intact open and close his hand normally wrist extension flexion normal  Recommend therapy return 6 weeks physical exam at that time

## 2013-05-01 ENCOUNTER — Telehealth: Payer: Self-pay | Admitting: Orthopedic Surgery

## 2013-05-01 NOTE — Telephone Encounter (Signed)
Mary/[facility] left a message that she has received the referral for Sabrina [name]. Once she gets the authorization from [employer], Will send someone out to see the patient. Mary's # [phone]  Ext [phone]

## 2013-05-05 ENCOUNTER — Telehealth: Payer: Self-pay | Admitting: *Deleted

## 2013-05-05 NOTE — Telephone Encounter (Signed)
Sabrina [name] with [facility] called Friday after the office had closed. He went out to evaluate patient, and he wants to know if there are any limitations with therapy as far as range of motion. He states he did not see anything in your notes and just wanted to clarify. Please Advise.Thanks! Sabrina [name]'s number [phone]

## 2013-05-08 ENCOUNTER — Telehealth: Payer: Self-pay | Admitting: Orthopedic Surgery

## 2013-05-08 NOTE — Telephone Encounter (Signed)
Returned call to [facility] and advised her that I sent the order and I had spoke with Kathlene November regarding no limitations.

## 2013-05-08 NOTE — Telephone Encounter (Addendum)
Georgiann Hahn, from [facility], called for verbal authorization to continue physical/occupational therapy for patient; states has done initial visit per orders, and is requesting verbal to continue occupational therapy for shoulder fracture care,date of injury 04/03/13.  Georgiann Hahn is also verifying continuing home health care. Patient's next scheduled appointment is 06/10/13.  Her direct ph# is [phone].

## 2013-05-26 ENCOUNTER — Encounter: Payer: Self-pay | Admitting: Gastroenterology

## 2013-06-03 ENCOUNTER — Telehealth: Payer: Self-pay | Admitting: Orthopedic Surgery

## 2013-06-03 MED ORDER — PROMETHAZINE HCL 12.5 MG PO TABS: ORAL_TABLET | ORAL | Status: DC

## 2013-06-03 NOTE — Addendum Note (Signed)
Addended by: [place] on: 06/03/2013 01:46 PM   Modules accepted: Orders

## 2013-06-03 NOTE — Telephone Encounter (Signed)
Call from [place], therapist, [facility], direct ph# [phone], requesting order for 1 additional week of occupational home therapy, to "get her through" to date of next appointment here, 06/10/13, for 2 times per week, for 1 week.  States verbal order would be appreciated plus faxed copy to [facility].  Please advise.

## 2013-06-03 NOTE — Telephone Encounter (Signed)
PT DIDN'T HAVE REFILLS FOR PHENERGAN #90 REFILLS x11.

## 2013-06-04 NOTE — Telephone Encounter (Signed)
FYI.Marland KitchenMarland KitchenMarland KitchenPhysical Therapist, Georgiann Hahn, was given verbal order to extend therapy per DR. Romeo Apple. Also, she wanted me to make a note that the patient has motivation issues,and she wanted you to be aware that the patient is also c/o right arm pain even though the fracture is on the left. The patient is scheduled to see you again 06/10/13 at 11:00 am.

## 2013-06-10 ENCOUNTER — Encounter: Payer: Self-pay | Admitting: Orthopedic Surgery

## 2013-06-10 ENCOUNTER — Ambulatory Visit (INDEPENDENT_AMBULATORY_CARE_PROVIDER_SITE_OTHER): Payer: Medicare Other | Admitting: Orthopedic Surgery

## 2013-06-10 VITALS — BP 175/96 | Ht 63.0 in | Wt 121.0 lb

## 2013-06-10 DIAGNOSIS — S42309D Unspecified fracture of shaft of humerus, unspecified arm, subsequent encounter for fracture with routine healing: Secondary | ICD-10-CM

## 2013-06-10 DIAGNOSIS — M67919 Unspecified disorder of synovium and tendon, unspecified shoulder: Secondary | ICD-10-CM

## 2013-06-10 DIAGNOSIS — S42202D Unspecified fracture of upper end of left humerus, subsequent encounter for fracture with routine healing: Secondary | ICD-10-CM

## 2013-06-10 DIAGNOSIS — M75101 Unspecified rotator cuff tear or rupture of right shoulder, not specified as traumatic: Secondary | ICD-10-CM

## 2013-06-10 NOTE — Progress Notes (Signed)
Patient ID: PICCOLA ARICO, female   DOB: [birth year], 79 y.o.   MRN: [id number]  Chief Complaint  Patient presents with  . Follow-up    6 week follow up right shoulder s/p home therapy DOI 04/03/13    Ht 5\' 3"  (1.6 m)  Wt 121 lb (54.885 kg)  BMI 21.44 kg/m2 Last visit Fracture care Left proximal humerus fracture Fracture occurred 2 weeks ago X-ray shows good consolidation of the fracture no change in position Patient neurovascular exam intact open and close his hand normally wrist extension flexion normal Recommend therapy return 6 weeks physical exam at that time   The therapy notes indicate the patient has made excellent progress regain 120 of flexion she has minimal discomfort on the left side  New problem pain anterolateral right shoulder radiating into right arm. No trauma. Deep aching pain known numbness or tingling review of systems related to this shows no neurologic complaints no weakness  Physical exam General appearance is normal, the patient is alert and oriented x3 with normal mood and affect. Tenderness in the rotator interval and the soft tissue in the posterior lateral acromial region painful terminal flexion with normal passive range of motion normal external rotation positive Hawkins and impingement signs with negative abduction external rotation test Neurovascular exam intact no muscle atrophy no muscle weakness  Impingement syndrome right shoulder  New problem  Inject right subacromial space  Shoulder Injection Procedure Note   Pre-operative Diagnosis: right  RC Syndrome  Post-operative Diagnosis: same  Indications: pain   Anesthesia: ethyl chloride   Procedure Details   Verbal consent was obtained for the procedure. The shoulder was prepped withalcohol and the skin was anesthetized. A 20 gauge needle was advanced into the subacromial space through posterior approach without difficulty  The space was then injected with 3 ml 1% lidocaine and 1 ml of  depomedrol. The injection site was cleansed with isopropyl alcohol and a dressing was applied.  Complications:  None; patient tolerated the procedure well.

## 2013-06-10 NOTE — Patient Instructions (Signed)
You have received a steroid shot. 15% of patients experience increased pain at the injection site with in the next 24 hours. This is best treated with ice and tylenol extra strength 2 tabs every 8 hours. If you are still having pain please call the office.    

## 2013-06-12 ENCOUNTER — Telehealth: Payer: Self-pay | Admitting: Orthopedic Surgery

## 2013-06-12 NOTE — Telephone Encounter (Signed)
Error

## 2013-06-26 ENCOUNTER — Ambulatory Visit: Payer: Medicare Other | Admitting: Gastroenterology

## 2013-07-16 ENCOUNTER — Encounter: Payer: Self-pay | Admitting: Gastroenterology

## 2013-07-16 ENCOUNTER — Ambulatory Visit (INDEPENDENT_AMBULATORY_CARE_PROVIDER_SITE_OTHER): Payer: Medicare Other | Admitting: Gastroenterology

## 2013-07-16 VITALS — BP 134/75 | HR 62 | Temp 97.4°F | Ht 60.0 in | Wt 120.6 lb

## 2013-07-16 DIAGNOSIS — R112 Nausea with vomiting, unspecified: Secondary | ICD-10-CM

## 2013-07-16 DIAGNOSIS — K648 Other hemorrhoids: Secondary | ICD-10-CM

## 2013-07-16 DIAGNOSIS — R197 Diarrhea, unspecified: Secondary | ICD-10-CM

## 2013-07-16 MED ORDER — HYDROCORTISONE 2.5 % RE CREA: TOPICAL_CREAM | Freq: Two times a day (BID) | RECTAL | Status: DC

## 2013-07-16 NOTE — Progress Notes (Signed)
Subjective:    Patient ID: Sabrina [name], female    DOB: Mar 08, 1932, 79 y.o.   MRN: [id number]  [facility], [place]  HPI Still having vomiting: 1-2x/week. ABLE TO MAINTAIN HER WEIGHT. NO BLOOD IN VOMIT. NOT PRECEDED BY HEARTBURN OR INDIGESTION. FELL IN JUN 2014 AND BROKE HER HUMERUS. GOT BETTER. FELL AGAIN BUT DIDN'T GO TO THE DOCTOR. BMs: QUITE A BIT OF BMs. WEARING DIAPERS A COUPLE WEEKS AGO. HAVING MESSES IN HER PANTS. DOESN'T FEEL WHEN SHE IS HAVING A BMs.-AT LEAST 1X/WEEK. Using DEPENDS BUT HAD A SORE BOTTOM AND SO SHE STOPPED USING THEM. NO FORM TO THE STOOL. STOOL: #6-7. LIKES CHEESE. NO MILK. USES SILK. ICE CREAM: RARE. USING KAOPECTATE DURING THE WEEK.   Past Medical History  Diagnosis Date  . Diabetes mellitus   . Hypertension   . Stroke   . Asthma   . H. pylori infection MAR 2014   Past Surgical History  Procedure Laterality Date  . Colectomy      secondary to diverticulitis  . Cholecystectomy    . Esophagogastroduodenoscopy (egd) with esophageal dilation N/A 01/07/2013    WUJ:[id number] ring was found at the gastroesophageal junction/SINGLE DUODENAL DIVERTICULUM   No Known Allergies  Current Outpatient Prescriptions  Medication Sig Dispense Refill  . Ascorbic Acid (VITAMIN C) 1000 MG tablet Take 1,000 mg by mouth daily.      Marland Kitchen aspirin 81 MG tablet Take 81 mg by mouth 2 (two) times daily.      Marland Kitchen CALCIUM-MAGNESIUM-ZINC PO Take by mouth daily.      . citalopram (CELEXA) 10 MG tablet Take 10 mg by mouth daily.       Marland Kitchen glucose blood (PRODIGY TEST) test strip 1 each by Other route as needed for other. Use as instructed      . HYDROcodone-acetaminophen (NORCO) 5-325 MG per tablet Take 2 tablets by mouth every 4 (four) hours as needed for pain.    Marland Kitchen lisinopril (PRINIVIL,ZESTRIL) 20 MG tablet Take 10 mg by mouth daily.     . metoprolol (LOPRESSOR) 50 MG tablet Take 50 mg by mouth 2 (two) times daily.    . Multiple Vitamin (MULTIVITAMIN) capsule Take 1 capsule by mouth daily.    Marland Kitchen  omeprazole (PRILOSEC) 40 MG capsule Take 40 mg by mouth 2 (two) times daily.    . promethazine (PHENERGAN) 12.5 MG tablet 1 po 30 MINS PRIOR TO MEALS TID    . VITAMIN B COMPLEX-C PO Take by mouth daily.      . vitamin E 400 UNIT capsule Take 400 Units by mouth daily.           Review of Systems     Objective:   Physical Exam  Vitals reviewed. Constitutional: She appears well-nourished. No distress.  HENT:  Head: Normocephalic and atraumatic.  Mouth/Throat: Oropharynx is clear and moist. No oropharyngeal exudate.  Eyes: Pupils are equal, round, and reactive to light. No scleral icterus.  Neck: Normal range of motion. Neck supple.  Cardiovascular: Normal rate, regular rhythm and normal heart sounds.   Pulmonary/Chest: Effort normal and breath sounds normal. No respiratory distress.  Abdominal: Soft. Bowel sounds are normal. She exhibits no distension. There is no tenderness.  Genitourinary:     EXTERNAL HEMORRHOIDS NO RECTAL MASS PALPATED, NONTENDER EXAM  Musculoskeletal: She exhibits no edema.  WALKS ASSISTED WITH A WALKER.   Lymphadenopathy:    She has no cervical adenopathy.  Neurological: She is alert.  NO  NEW FOCAL DEFICITS DIFFICULTY RECALLING EVENTS  FROM RECENT PAST  Psychiatric:  FLAT AFFECT, NL MOOD           Assessment & Plan:

## 2013-07-16 NOTE — Assessment & Plan Note (Signed)
SEEING STREAKS WHEN SHE WIPES.  HCT CREAM QID FOR FLARES CONSIDER [facility] BANDING IF SX WORSEN. PT'S HUSBAND WILL CALL FOR WORSENING RECTAL BLEEDING,PAIN,OR PRESSURE. OPV IN 3 MSO

## 2013-07-16 NOTE — Patient Instructions (Signed)
SUBMIT STOOL FOR C DIFF PCR.  TAKE CREON 2 WITH MEALS AND 1 WITH SNACK FOR 2 WEEKS. CALL ME IN IN 2 WKS.  BELOW IS INFO ON A LACTOSE FREE DIET.  FOLLOW UP IN 3 MOS.     Lactose Free Diet Lactose is a carbohydrate that is found mainly in milk and milk products, as well as in foods with added milk or whey. Lactose must be digested by the enzyme in order to be used by the body. Lactose intolerance occurs when there is a shortage of lactase. When your body is not able to digest lactose, you may feel sick to your stomach (nausea), bloating, cramping, gas and diarrhea.  There are many dairy products that may be tolerated better than milk by some people:  The use of cultured dairy products such as yogurt, buttermilk, cottage cheese, and sweet acidophilus milk (Kefir) for lactase-deficient individuals is usually well tolerated. This is because the healthy bacteria help digest lactose.   Lactose-hydrolyzed milk (Lactaid) contains 40-90% less lactose than milk and may also be well tolerated.    SPECIAL NOTES  Lactose is a carbohydrates. The major food source is dairy products. Reading food labels is important. Many products contain lactose even when they are not made from milk. Look for the following words: whey, milk solids, dry milk solids, nonfat dry milk powder. Typical sources of lactose other than dairy products include breads, candies, cold cuts, prepared and processed foods, and commercial sauces and gravies.   All foods must be prepared without milk, cream, or other dairy foods.   Soy milk and lactose-free supplements (LACTASE) may be used as an alternative to milk.   FOOD GROUP ALLOWED/RECOMMENDED AVOID/USE SPARINGLY  BREADS / STARCHES 4 servings or more* Breads and rolls made without milk. [place], [place], or [place] bread. Breads and rolls that contain milk. Prepared mixes such as muffins, biscuits, waffles, pancakes. Sweet rolls, donuts, [place] toast (if made with milk or lactose).   Crackers: Soda crackers, graham crackers. Any crackers prepared without lactose. Zwieback crackers, corn curls, or any that contain lactose.  Cereals: Cooked or dry cereals prepared without lactose (read labels). Cooked or dry cereals prepared with lactose (read labels). Total, Cocoa Krispies. Special K.  Potatoes / Pasta / Rice: Any prepared without milk or lactose. Popcorn. Instant potatoes, frozen [place] fries, scalloped or au gratin potatoes.  VEGETABLES 2 servings or more Fresh, frozen, and canned vegetables. Creamed or breaded vegetables. Vegetables in a cheese sauce or with lactose-containing margarines.  FRUIT 2 servings or more All fresh, canned, or frozen fruits that are not processed with lactose. Any canned or frozen fruits processed with lactose.  MEAT & SUBSTITUTES 2 servings or more (4 to 6 oz. total per day) Plain beef, chicken, fish, [place], lamb, veal, pork, or ham. Kosher prepared meat products. Strained or junior meats that do not contain milk. Eggs, soy meat substitutes, nuts. Scrambled eggs, omelets, and souffles that contain milk. Creamed or breaded meat, fish, or fowl. Sausage products such as wieners, liver sausage, or cold cuts that contain milk solids. Cheese, cottage cheese, or cheese spreads.  MILK None. (See "BEVERAGES" for milk substitutes. See "DESSERTS" for ice cream and frozen desserts.) Milk (whole, 2%, skim, or chocolate). Evaporated, powdered, or condensed milk; malted milk.  SOUPS & COMBINATION FOODS Bouillon, broth, vegetable soups, clear soups, consomms. Homemade soups made with allowed ingredients. Combination or prepared foods that do not contain milk or milk products (read labels). Cream soups, chowders, commercially prepared soups  containing lactose. Macaroni and cheese, pizza. Combination or prepared foods that contain milk or milk products.  DESSERTS & SWEETS In moderation Water and fruit ices; gelatin; angel food cake. Homemade cookies, pies, or cakes  made from allowed ingredients. Pudding (if made with water or a milk substitute). Lactose-free tofu desserts. Sugar, honey, corn syrup, jam, jelly; marmalade; molasses (beet sugar); Pure sugar candy; marshmallows. Ice cream, ice milk, sherbet, custard, pudding, frozen yogurt. Commercial cake and cookie mixes. Desserts that contain chocolate. Pie crust made with milk-containing margarine; reduced-calorie desserts made with a sugar substitute that contains lactose. Toffee, peppermint, butterscotch, chocolate, caramels.  FATS & OILS In moderation Butter (as tolerated; contains very small amounts of lactose). Margarines and dressings that do not contain milk, Vegetable oils, shortening, Miracle Whip, mayonnaise, nondairy cream & whipped toppings without lactose or milk solids added (examples: Coffee Rich, Carnation Coffeemate, Rich's Whipped Topping, PolyRich). Tomasa Blase. Margarines and salad dressings containing milk; cream, cream cheese; peanut butter with added milk solids, sour cream, chip dips, made with sour cream.  BEVERAGES Carbonated drinks; tea; coffee and freeze-dried coffee; some instant coffees (check labels). Fruit drinks; fruit and vegetable juice; Rice or Soy milk. Ovaltine, hot chocolate. Some cocoas; some instant coffees; instant iced teas; powdered fruit drinks (read labels).   CONDIMENTS / MISCELLANEOUS Soy sauce, carob powder, olives, gravy made with water, baker's cocoa, pickles, pure seasonings and spices, wine, pure monosodium glutamate, catsup, mustard. Some chewing gums, chocolate, some cocoas. Certain antibiotics and vitamin / mineral preparations. Spice blends if they contain milk products. MSG extender. Artificial sweeteners that contain lactose such as Equal (Nutra-Sweet) and Sweet 'n Low. Some nondairy creamers (read labels).   SAMPLE MENU*  Breakfast   Orange Juice.  Banana.   Bran flakes.   Nondairy Creamer.  Vienna Bread (toasted).   Butter or milk-free  margarine.   Coffee or tea.    Noon Meal   Chicken Breast.  Rice.   Green beans.   Butter or milk-free margarine.  Fresh melon.   Coffee or tea.    Evening Meal   Roast Beef.  Baked potato.   Butter or milk-free margarine.   Broccoli.   Lettuce salad with vinegar and oil dressing.  [employer].   Coffee or tea.

## 2013-07-16 NOTE — Assessment & Plan Note (Addendum)
SX NOT IDEALLY CONTROLLED. ETIOLOGY UNCLEAR: PANCREATIC INSUFFICIENCY, LACTOSE INTOLERANCE, LESS LIKELY C DIFF  SUBMIT STOOL FOR C DIFF PCR CREON 2 WITH MEALS AND 1 WITH SNACK FOR 2 WEEKS. CALL IN 2 WKS IF DO IMPROVEMENT, D/C CREON AND AVOID DAIRY. LACTOSE FREE DIET GO GIVEN OPV IN 3 MOS.  OBTAIN HPI/PE, TO ADDRESS ALL PROBLEMS, AND DISCUSS MANAGEMENT: 35 MINS

## 2013-07-16 NOTE — Assessment & Plan Note (Signed)
FAIRLY WELL CONTROLLED. WEIGHT STABLE.  PHENERGAN AS NEEDED. OPV IN 3 MOS

## 2013-07-17 NOTE — Progress Notes (Signed)
CC'd to PCP 

## 2013-07-21 NOTE — Progress Notes (Signed)
Reminder in epic °

## 2013-07-24 NOTE — Progress Notes (Signed)
pts husband is aware.  Darl Pikes, please nic ov in 3 months.

## 2013-07-24 NOTE — Progress Notes (Addendum)
PLEASE CALL PT'S HUSBAND. HER C DIFF TEST IS NEGATIVE.   CONTINUE CREON 2 WITH MEALS AND 1 WITH SNACK FOR 2 WEEKS. CALL ME IN 2 WEEKS.  FOLLOW A LACTOSE FREE DIET.  FOLLOW UP IN 3 MOS.

## 2013-07-24 NOTE — Progress Notes (Signed)
Reminder in epic °

## 2013-07-30 ENCOUNTER — Telehealth: Payer: Self-pay

## 2013-07-30 MED ORDER — PANCRELIPASE (LIP-PROT-AMYL) 36000-114000 UNITS PO CPEP: 1.0000 | ORAL_CAPSULE | ORAL | Status: DC

## 2013-07-30 NOTE — Telephone Encounter (Signed)
Called and informed pts husband

## 2013-07-30 NOTE — Telephone Encounter (Signed)
PT's husband called and said she has done much better on the lactose free diet and the creon. She does not have any more samples of the creon. Please advise!

## 2013-07-30 NOTE — Telephone Encounter (Signed)
PLEASE CALL PT'S FAMILY. Rx for creon sent to pharmacy.

## 2014-01-29 ENCOUNTER — Telehealth: Payer: Self-pay | Admitting: *Deleted

## 2014-01-29 MED ORDER — ONDANSETRON HCL 4 MG PO TABS: 4.0000 mg | ORAL_TABLET | Freq: Three times a day (TID) | ORAL | Status: DC | PRN

## 2014-01-29 NOTE — Telephone Encounter (Signed)
The call back number for Candy, the nurse is [phone]. I told her I will let her know what recommendations are made and about the prescription.

## 2014-01-29 NOTE — Telephone Encounter (Signed)
T/C from an Charity fundraiserN [facility] [place]. She said the pt is having trouble trying to get PA for phenergan and she really does need it. Said she is still having a lot of nausea/vomiting and has lost 4 lbs.   I called and spoke to the husband and he said she has lost 2 lbs in the last week. She ate a can of cream of chicken soup last night and was able to keep that down, said it was the first thing that she has kept down in over a week other than her Ensure. She drinks about 3 cans per day and sometimes she keeps it down and sometimes she does not.   He is aware that Raynelle FanningJulie is working on the PA for the phenergan.  Please advise!

## 2014-01-29 NOTE — Telephone Encounter (Signed)
I have sent in Zofran to take every 8 hours prn N/V. If she is unable to tolerate liquids or dehydrated, needs to seek care.

## 2014-01-29 NOTE — Telephone Encounter (Signed)
Pt's husband called about pt's autho. I made him aware we do have it and julie is working on it, per husband pt has been vomiting and she has lost 2lbs she really needs her medication. Please advise

## 2014-01-30 NOTE — Telephone Encounter (Signed)
Per Gerrit HallsAnna Sams, NP, pt is scheduled in urgent slot for Tues at 02/03/2014 at 3:00 PM for weight loss.  She and her husband are aware of the appt.  They are aware that she needs to go to the ED if she is unable to keep fluids down.

## 2014-02-03 ENCOUNTER — Encounter: Payer: Self-pay | Admitting: Gastroenterology

## 2014-02-03 ENCOUNTER — Encounter (INDEPENDENT_AMBULATORY_CARE_PROVIDER_SITE_OTHER): Payer: Self-pay

## 2014-02-03 ENCOUNTER — Ambulatory Visit (INDEPENDENT_AMBULATORY_CARE_PROVIDER_SITE_OTHER): Payer: Medicare Other | Admitting: Gastroenterology

## 2014-02-03 VITALS — BP 140/75 | HR 76 | Temp 97.6°F | Ht 64.0 in | Wt 120.4 lb

## 2014-02-03 DIAGNOSIS — R112 Nausea with vomiting, unspecified: Secondary | ICD-10-CM

## 2014-02-03 NOTE — Progress Notes (Signed)
Referring Provider: Kirstie PeriShah, [name], MD Primary Care Physician:  Kirstie PeriSHAH,[name], MD Primary GI: Dr. Darrick PennaFields   Chief Complaint  Patient presents with  . Nausea  . Emesis    HPI:   Sabrina [name] presents today as an urgent work-in secondary to N/V and reports of weight loss. History of chronic N/V. Chronic diarrhea with question of pancreatic insufficiency, provided Creon in Oct 2014. Question of lactose intolerance as well. She actually weighs 120 today, which is same as Oct 2014. Husband present. Insurance has denied Phenergan. Zofran sent in recently with improvement in symptoms. Mostly phlegm. Thinks she is back to her baseline. Forces self to eat. Husband and patient desire to have further follow-ups through PCP. Only taking Zofran once per day. Husband states he is tired of paying co pay when Dr. Sherryll BurgerShah could perform same care.   Past Medical History  Diagnosis Date  . Diabetes mellitus   . Hypertension   . Stroke   . Asthma   . H. pylori infection MAR 2014    Past Surgical History  Procedure Laterality Date  . Colectomy      secondary to diverticulitis  . Cholecystectomy    . Esophagogastroduodenoscopy (egd) with esophageal dilation N/A 01/07/2013    WUJ:WJXBJYNWSLF:Schatzki ring was found at the gastroesophageal junction/SINGLE DUODENAL DIVERTICULUM    Current Outpatient Prescriptions  Medication Sig Dispense Refill  . Ascorbic Acid (VITAMIN C) 1000 MG tablet Take 1,000 mg by mouth daily.      Marland Kitchen. aspirin 81 MG tablet Take 81 mg by mouth 2 (two) times daily.      Marland Kitchen. CALCIUM-MAGNESIUM-ZINC PO Take by mouth daily.      . citalopram (CELEXA) 10 MG tablet Take 10 mg by mouth daily.       Marland Kitchen. glucose blood (PRODIGY TEST) test strip 1 each by Other route as needed for other. Use as instructed      . lisinopril (PRINIVIL,ZESTRIL) 20 MG tablet Take 10 mg by mouth daily.       . metoprolol (LOPRESSOR) 50 MG tablet Take 50 mg by mouth 2 (two) times daily.      . Multiple Vitamin (MULTIVITAMIN)  capsule Take 1 capsule by mouth daily.      Marland Kitchen. omeprazole (PRILOSEC) 40 MG capsule Take 40 mg by mouth 2 (two) times daily.      . ondansetron (ZOFRAN) 4 MG tablet Take 1 tablet (4 mg total) by mouth every 8 (eight) hours as needed for nausea or vomiting.  30 tablet  1  . VITAMIN B COMPLEX-C PO Take by mouth daily.      . vitamin E 400 UNIT capsule Take 400 Units by mouth daily.      Marland Kitchen. HYDROcodone-acetaminophen (NORCO) 5-325 MG per tablet Take 2 tablets by mouth every 4 (four) hours as needed for pain.  60 tablet  1  . hydrocortisone (PROCTOZONE-HC) 2.5 % rectal cream Place rectally 2 (two) times daily.  30 g  0  . Pancrelipase, Lip-Prot-Amyl, (CREON) 36000 UNITS CPEP Take 1 capsule by mouth with snacks. 2 PO WITH MEALS  300 capsule  11  . promethazine (PHENERGAN) 12.5 MG tablet 1 po 30 MINS PRIOR TO MEALS TID  90 tablet  11   No current facility-administered medications for this visit.    Allergies as of 02/03/2014  . (No Known Allergies)    Family History  Problem Relation Age of Onset  . Colon cancer Neg Hx     History  Social History  . Marital Status: Married    Spouse Name: N/A    Number of Children: N/A  . Years of Education: N/A   Social History Main Topics  . Smoking status: Never Smoker   . Smokeless tobacco: None  . Alcohol Use: No  . Drug Use: No  . Sexual Activity: None   Other Topics Concern  . None   Social History Narrative  . None    Review of Systems: As mentioned in HPI  Physical Exam: BP 140/75  Pulse 76  Temp(Src) 97.6 F (36.4 C) (Oral)  Ht 5\' 4"  (1.626 m)  Wt 120 lb 6.4 oz (54.613 kg)  BMI 20.66 kg/m2 General:   Alert and oriented. No distress noted. Pleasant and cooperative.  Heart:  S1, S2 present without murmurs, rubs, or gallops. Regular rate and rhythm. Abdomen:  +BS, soft, non-tender and non-distended. Limited evaluation, patient sitting up in chair. Unable to get on exam table.  Extremities:  Without edema. Neurologic:  Alert  and  oriented x4;  grossly normal neurologically. Skin:  Intact without significant lesions or rashes. Psych:  Alert and cooperative. Normal mood and affect.

## 2014-02-03 NOTE — Telephone Encounter (Addendum)
PA for phenergan was denied. Pt has ov today with AS.

## 2014-02-03 NOTE — Patient Instructions (Signed)
Continue Zofran once daily; however, if needed you could increase to twice a day  We will see you back as needed.

## 2014-02-08 NOTE — Assessment & Plan Note (Signed)
Chronic, weight stable since October. Phenergan no longer approved through insurance. Doing well with Zofran once daily. Patient and husband desire further refills and care through Dr. Sherryll BurgerShah. Discussed I would be happy to provide refills and continue to see patient. Husband declines. Follow-up prn. Zofran BID.

## 2014-02-09 NOTE — Progress Notes (Signed)
cc'd to pcp 

## 2016-06-08 ENCOUNTER — Emergency Department (HOSPITAL_COMMUNITY)
Admission: EM | Admit: 2016-06-08 | Discharge: 2016-06-08 | Disposition: A | Discharge status: Home / Self Care | Payer: Medicare Other | Attending: Emergency Medicine | Admitting: Emergency Medicine

## 2016-06-08 ENCOUNTER — Emergency Department (HOSPITAL_COMMUNITY): Admission: EM | Admit: 2016-06-08 | Discharge: 2016-06-08 | Discharge status: Absent without leave | Payer: Self-pay

## 2016-06-08 ENCOUNTER — Encounter (HOSPITAL_COMMUNITY): Payer: Self-pay | Admitting: Emergency Medicine

## 2016-06-08 ENCOUNTER — Emergency Department (HOSPITAL_COMMUNITY): Payer: Medicare Other

## 2016-06-08 DIAGNOSIS — I10 Essential (primary) hypertension: Secondary | ICD-10-CM | POA: Diagnosis not present

## 2016-06-08 DIAGNOSIS — F0390 Unspecified dementia without behavioral disturbance: Secondary | ICD-10-CM | POA: Insufficient documentation

## 2016-06-08 DIAGNOSIS — Y999 Unspecified external cause status: Secondary | ICD-10-CM | POA: Insufficient documentation

## 2016-06-08 DIAGNOSIS — Y939 Activity, unspecified: Secondary | ICD-10-CM | POA: Diagnosis not present

## 2016-06-08 DIAGNOSIS — Z79899 Other long term (current) drug therapy: Secondary | ICD-10-CM | POA: Insufficient documentation

## 2016-06-08 DIAGNOSIS — Y929 Unspecified place or not applicable: Secondary | ICD-10-CM | POA: Insufficient documentation

## 2016-06-08 DIAGNOSIS — Z7982 Long term (current) use of aspirin: Secondary | ICD-10-CM | POA: Insufficient documentation

## 2016-06-08 DIAGNOSIS — S4991XA Unspecified injury of right shoulder and upper arm, initial encounter: Secondary | ICD-10-CM | POA: Diagnosis present

## 2016-06-08 DIAGNOSIS — E119 Type 2 diabetes mellitus without complications: Secondary | ICD-10-CM | POA: Insufficient documentation

## 2016-06-08 DIAGNOSIS — J45909 Unspecified asthma, uncomplicated: Secondary | ICD-10-CM | POA: Diagnosis not present

## 2016-06-08 DIAGNOSIS — S40011A Contusion of right shoulder, initial encounter: Secondary | ICD-10-CM

## 2016-06-08 DIAGNOSIS — W19XXXA Unspecified fall, initial encounter: Secondary | ICD-10-CM | POA: Diagnosis not present

## 2016-06-08 HISTORY — DX: Unspecified dementia, unspecified severity, without behavioral disturbance, psychotic disturbance, mood disturbance, and anxiety: F03.90

## 2016-06-08 MED ORDER — HYDROCODONE-ACETAMINOPHEN 5-325 MG PO TABS: ORAL_TABLET | ORAL | 0 refills | Status: DC

## 2016-06-08 NOTE — Discharge Instructions (Signed)
Follow up with your family md next week for recheck of shoulder if not improving.

## 2016-06-08 NOTE — ED Provider Notes (Signed)
[facility]-EMERGENCY DEPT Provider Note   CSN: [id number] Arrival date & time: 06/08/16  1752     History   Chief Complaint Chief Complaint  Patient presents with  . Shoulder Pain    HPI Sabrina [name] is a 82 y.o. female.  Patient fell on her right shoulder and has some right shoulder pain   The history is provided by the spouse. No language interpreter was used.  Shoulder Pain   This is a new problem. The current episode started 12 to 24 hours ago. The problem occurs rarely. The problem has been resolved. Pain location: Right shoulder. The pain is at a severity of 3/10. The pain is mild. Associated symptoms include limited range of motion. Exacerbated by: Movement. She has tried nothing for the symptoms. The treatment provided no relief. There has been a history of trauma.    Past Medical History:  Diagnosis Date  . Asthma   . Dementia   . Diabetes mellitus   . H. pylori infection MAR 2014  . Hypertension   . Stroke [facility])     Patient Active Problem List   Diagnosis Date Noted  . Diarrhea 07/16/2013  . Internal hemorrhoids with other complication 07/16/2013  . Rotator cuff syndrome of right shoulder 06/10/2013  . Fracture, humerus, proximal 04/08/2013  . Postural imbalance 02/19/2013  . Nausea with vomiting 01/06/2013    Past Surgical History:  Procedure Laterality Date  . CHOLECYSTECTOMY    . COLECTOMY     secondary to diverticulitis  . ESOPHAGOGASTRODUODENOSCOPY (EGD) WITH ESOPHAGEAL DILATION N/A 01/07/2013   WUJ:[id number]:Schatzki ring was found at the gastroesophageal junction/SINGLE DUODENAL DIVERTICULUM    OB History    No data available       Home Medications    Prior to Admission medications   Medication Sig Start Date End Date Taking? Authorizing Provider  Ascorbic Acid (VITAMIN C) 1000 MG tablet Take 1,000 mg by mouth daily.    Historical Provider, MD  aspirin 81 MG tablet Take 81 mg by mouth 2 (two) times daily.    Historical Provider, MD    CALCIUM-MAGNESIUM-ZINC PO Take by mouth daily.    Historical Provider, MD  citalopram (CELEXA) 10 MG tablet Take 10 mg by mouth daily.  01/27/13   Historical Provider, MD  glucose blood (PRODIGY TEST) test strip 1 each by Other route as needed for other. Use as instructed    Historical Provider, MD  HYDROcodone-acetaminophen (NORCO/VICODIN) 5-325 MG tablet Take one every 6 hours for pain not helped by tylenol or motrin 06/08/16   Bethann BerkshireJoseph Raizy Auzenne, MD  hydrocortisone (PROCTOZONE-HC) 2.5 % rectal cream Place rectally 2 (two) times daily. 07/16/13   West BaliSandi L Fields, MD  lisinopril (PRINIVIL,ZESTRIL) 20 MG tablet Take 10 mg by mouth daily.     Historical Provider, MD  metoprolol (LOPRESSOR) 50 MG tablet Take 50 mg by mouth 2 (two) times daily.    Historical Provider, MD  Multiple Vitamin (MULTIVITAMIN) capsule Take 1 capsule by mouth daily.    Historical Provider, MD  omeprazole (PRILOSEC) 40 MG capsule Take 40 mg by mouth 2 (two) times daily.    Historical Provider, MD  ondansetron (ZOFRAN) 4 MG tablet Take 1 tablet (4 mg total) by mouth every 8 (eight) hours as needed for nausea or vomiting. 01/29/14   Gelene MinkAnna W Boone, NP  Pancrelipase, Lip-Prot-Amyl, (CREON) 36000 UNITS CPEP Take 1 capsule by mouth with snacks. 2 PO WITH MEALS 07/30/13   West BaliSandi L Fields, MD  promethazine (PHENERGAN) 12.5 MG  tablet 1 po 30 MINS PRIOR TO MEALS TID 06/03/13   [place], MD  VITAMIN B COMPLEX-C PO Take by mouth daily.    Historical Provider, MD  vitamin E 400 UNIT capsule Take 400 Units by mouth daily.    Historical Provider, MD    Family History Family History  Problem Relation Age of Onset  . Colon cancer Neg Hx     Social History Social History  Substance Use Topics  . Smoking status: Never Smoker  . Smokeless tobacco: Never Used  . Alcohol use No     Allergies   Review of patient's allergies indicates no known allergies.   Review of Systems Review of Systems  Unable to perform ROS: Dementia      Physical Exam Updated Vital Signs BP (!) 192/110 (BP Location: Left Arm)   Pulse (!) 56   Temp 97.7 F (36.5 C) (Oral)   Resp 15   Ht 5\' 5"  (1.651 m)   Wt 125 lb (56.7 kg)   SpO2 100%   BMI 20.80 kg/m   Physical Exam  Constitutional: She appears well-developed.  HENT:  Head: Normocephalic.  Eyes: Conjunctivae are normal.  Neck: No tracheal deviation present.  Cardiovascular:  No murmur heard. Musculoskeletal:  Mild-to-moderate tenderness of right shoulder neurovascular exam normal  Neurological: She is alert.  Skin: Skin is warm.  Psychiatric: She has a normal mood and affect.     ED Treatments / Results  Labs (all labs ordered are listed, but only abnormal results are displayed) Labs Reviewed - No data to display  EKG  EKG Interpretation None       Radiology Dg Shoulder Right  Result Date: 06/08/2016 CLINICAL DATA:  Falls in the last 2 weeks.  Right shoulder pain. EXAM: RIGHT SHOULDER - 2+ VIEW COMPARISON:  Chest radiograph of 04/06/2012 FINDINGS: Bony demineralization. Inferior glenoid spurring. No visible fracture. Soft tissue calcification in the right upper arm is likely benign/ incidental. The patient was unable to tolerate an axillary view. IMPRESSION: 1. Bony demineralization. 2. Degenerative glenohumeral spurring. 3. No fracture or dislocation identified. Electronically Signed   By: Gaylyn Rong M.D.   On: 06/08/2016 18:33    Procedures Procedures (including critical care time)  Medications Ordered in ED Medications - No data to display   Initial Impression / Assessment and Plan / ED Course  I have reviewed the triage vital signs and the nursing notes.  Pertinent labs & imaging results that were available during my care of the patient were reviewed by me and considered in my medical decision making (see chart for details).  Clinical Course    X-ray shows no fracture or dislocation. Patient's family members were instructed to give  the patient Tylenol or Motrin. She is also given a prescription for Vicodin if necessary and she will follow-up with her PCP  Final Clinical Impressions(s) / ED Diagnoses   Final diagnoses:  Shoulder contusion, right, initial encounter    New Prescriptions New Prescriptions   HYDROCODONE-ACETAMINOPHEN (NORCO/VICODIN) 5-325 MG TABLET    Take one every 6 hours for pain not helped by tylenol or motrin     Bethann Berkshire, MD 06/08/16 1924

## 2016-06-08 NOTE — ED Triage Notes (Signed)
Patient with multiple falls over last week. C/o right shoulder pain.

## 2016-06-12 ENCOUNTER — Inpatient Hospital Stay
Admission: RE | Admit: 2016-06-12 | Discharge: 2016-12-25 | Disposition: A | Discharge status: Other Acute Inpatient Hospital | Payer: Medicare Other | Source: Ambulatory Visit | Attending: Internal Medicine | Admitting: Internal Medicine

## 2016-06-12 DIAGNOSIS — R112 Nausea with vomiting, unspecified: Secondary | ICD-10-CM

## 2016-06-12 DIAGNOSIS — R1319 Other dysphagia: Principal | ICD-10-CM

## 2016-06-13 ENCOUNTER — Non-Acute Institutional Stay (SKILLED_NURSING_FACILITY): Payer: Medicare Other | Admitting: Internal Medicine

## 2016-06-13 ENCOUNTER — Encounter: Payer: Self-pay | Admitting: Internal Medicine

## 2016-06-13 DIAGNOSIS — R296 Repeated falls: Secondary | ICD-10-CM

## 2016-06-13 DIAGNOSIS — F0390 Unspecified dementia without behavioral disturbance: Secondary | ICD-10-CM

## 2016-06-13 DIAGNOSIS — R54 Age-related physical debility: Secondary | ICD-10-CM | POA: Diagnosis not present

## 2016-06-13 DIAGNOSIS — M75101 Unspecified rotator cuff tear or rupture of right shoulder, not specified as traumatic: Secondary | ICD-10-CM

## 2016-06-13 DIAGNOSIS — M25511 Pain in right shoulder: Secondary | ICD-10-CM

## 2016-06-13 DIAGNOSIS — I10 Essential (primary) hypertension: Secondary | ICD-10-CM

## 2016-06-13 NOTE — Progress Notes (Signed)
Provider:  Einar CrowAnjali,Maddyx Vallie Location:   [facility] Room Number: 115/W Place of Service:  SNF (31)  PCP: Kirstie PeriSHAH,[name], MD Patient Care Team: Kirstie PeriAshish Shah, MD as PCP - General (Internal Medicine) West BaliSandi L Fields, MD as Attending Physician (Gastroenterology)  Extended Emergency Contact Information Primary Emergency Contact: Wallis MartGarcia,James A Address: [address]          [place], [place] [address] [place] Home Phone: [phone] Mobile Phone: [phone] Relation: Spouse  Code Status: Full Code Goals of Care: Advanced Directive information Advanced Directives 06/13/2016  Does patient have an advance directive? Yes  Type of Advance Directive Healthcare Power of Attorney  Does patient want to make changes to advanced directive? No - Patient declined  Copy of advanced directive(s) in chart? -  Pre-existing out of facility DNR order (yellow form or pink MOST form) -      Chief Complaint  Patient presents with  . New Admit To SNF    HPI: Patient is a 82 y.o. female seen today for admission to SNF. Most of the history was obtained from the son and the patient. It seems patient has been having multiple falls  at home and SW was involved as it was thought that patient and her husband need help. During one of these falls patient injured her shoulder and was seen in the ER . The Xray of shoulder did not show any Fracture and she was discharged home. Patient is admitted here for PT and possible Long term placement.Her [facility] is in [facility] [place] and according to son not doing well. Only complain pat had was with Pain in her right Shoulder. This was the one got injured during fall. Most of her pain is around her area around the shoulder.  Past Medical History:  Diagnosis Date  . Asthma   . Dementia   . Diabetes mellitus   . H. pylori infection MAR 2014  . Hypertension   . Stroke [facility](HCC)    Past Surgical History:  Procedure Laterality Date  .  CHOLECYSTECTOMY    . COLECTOMY     secondary to diverticulitis  . ESOPHAGOGASTRODUODENOSCOPY (EGD) WITH ESOPHAGEAL DILATION N/A 01/07/2013   ZHY:[id number]:Schatzki ring was found at the gastroesophageal junction/SINGLE DUODENAL DIVERTICULUM    reports that she has never smoked. She has never used smokeless tobacco. She reports that she does not drink alcohol or use drugs. Social History   Social History  . Marital status: Married    Spouse name: N/A  . Number of children: N/A  . Years of education: N/A   Occupational History  . Not on file.   Social History Main Topics  . Smoking status: Never Smoker  . Smokeless tobacco: Never Used  . Alcohol use No  . Drug use: No  . Sexual activity: Not on file   Other Topics Concern  . Not on file   Social History Narrative  . No narrative on file    Functional Status Survey:    Family History  Problem Relation Age of Onset  . Colon cancer Neg Hx     Health Maintenance  Topic Date Due  . TETANUS/TDAP  06/03/1951  . ZOSTAVAX  06/03/1994  . DEXA SCAN  06/04/1999  . PNA vac Low Risk Adult (1 of 2 - PCV13) 06/04/1999  . INFLUENZA VACCINE  09/15/2016 (Originally 05/16/2016)    No Known Allergies    Medication List       Accurate as of 06/13/16  9:28  AM. Always use your most recent med list.          aspirin 81 MG tablet Take 81 mg by mouth 2 (two) times daily.   citalopram 10 MG tablet Commonly known as:  CELEXA Take 10 mg by mouth daily.   lisinopril 20 MG tablet Commonly known as:  PRINIVIL,ZESTRIL Take 10 mg by mouth daily.   metoprolol 50 MG tablet Commonly known as:  LOPRESSOR Take 50 mg by mouth 2 (two) times daily.   mirtazapine 15 MG tablet Commonly known as:  REMERON Take 15 mg by mouth at bedtime.   omeprazole 40 MG capsule Commonly known as:  PRILOSEC Take 40 mg by mouth 2 (two) times daily.   PRODIGY TEST test strip Generic drug:  glucose blood 1 each by Other route as needed for other. Use as  instructed       Review of Systems  Constitutional: Negative for chills, diaphoresis, fatigue, fever and unexpected weight change.  Respiratory: Negative for cough, chest tightness, shortness of breath and wheezing.   Cardiovascular: Negative for chest pain, palpitations and leg swelling.  Gastrointestinal: Negative for abdominal distention, abdominal pain, constipation, diarrhea and nausea.  Neurological: Negative for dizziness, syncope, light-headedness and numbness.  Psychiatric/Behavioral: Positive for confusion. Negative for hallucinations.    Vitals:   06/13/16 0907  BP: (!) 149/87  Pulse: 74  Resp: 18  Temp: 97.9 F (36.6 C)  TempSrc: Oral   There is no height or weight on file to calculate BMI. Physical Exam  Constitutional: She appears well-developed.  HENT:  Head: Normocephalic.  Eyes:  Patient has Bruise around her Right eye which seems old. No redness in eye.  Cardiovascular: Normal rate, regular rhythm and normal heart sounds.   Pulmonary/Chest: Breath sounds normal. No respiratory distress. She has no wheezes. She has no rales.  Abdominal: Bowel sounds are normal. She exhibits no distension. There is no tenderness. There is no guarding.  Musculoskeletal:       Right shoulder: She exhibits tenderness and bony tenderness. She exhibits no swelling.       Left shoulder: Normal. She exhibits no tenderness, no bony tenderness, no swelling and no effusion.  Patient was not able to move her right shoulder in active motion. But passively she was able to do it with some pain.  Neurological: She is alert.  Patient did know the name of state but didn't know the city or the place. Was not oriented about the year. Did recognize her son. Rt UE Strength was 4/5 and LE 3/5 Lt UE Strength was 4/5 and LE 3/5    Labs reviewed  Imaging and Procedures obtained prior to SNF admission: Dg Shoulder Right  Result Date: 06/08/2016 CLINICAL DATA:  Falls in the last 2 weeks.  Right  shoulder pain. EXAM: RIGHT SHOULDER - 2+ VIEW COMPARISON:  Chest radiograph of 04/06/2012 FINDINGS: Bony demineralization. Inferior glenoid spurring. No visible fracture. Soft tissue calcification in the right upper arm is likely benign/ incidental. The patient was unable to tolerate an axillary view. IMPRESSION: 1. Bony demineralization. 2. Degenerative glenohumeral spurring. 3. No fracture or dislocation identified. Electronically Signed   By: Gaylyn Rong M.D.   On: 06/08/2016 18:33    Assessment/Plan  Essential hypertension  Patient BP has been running mildly elevated. Would Check Vitals more frequently. Also check CMP to follow renal function. Consider increasing Lisinopril if renal function is stable.  Dementia, without behavioral disturbance  Patient does have moderate Dementia. Her last CT scan  was in 2014 AND showed atrophy. Will further evaluate cognitive function and see if patient is candidate for Aricept.  Falls frequently  Patient is very frail and had frequent falls at home. Will check Vit D. PT will help with balance. Fall precautions.  Pain in joint of right shoulder   Most likely secondary to due to injury sustained in fall . Will start on tylenol. Xray in ER was negative. Did show some bone loss.    Family/ staff Communication:  Discussed plan with pat. Son.  Labs/tests ordered: CBC, CMP. Serum Vit D

## 2016-06-14 ENCOUNTER — Encounter (HOSPITAL_COMMUNITY)
Admission: RE | Admit: 2016-06-14 | Discharge: 2016-06-14 | Disposition: A | Discharge status: Home / Self Care | Payer: Medicare Other | Source: Skilled Nursing Facility | Attending: Pediatrics | Admitting: Pediatrics

## 2016-06-14 ENCOUNTER — Non-Acute Institutional Stay (SKILLED_NURSING_FACILITY): Payer: Medicare Other | Admitting: Internal Medicine

## 2016-06-14 DIAGNOSIS — E876 Hypokalemia: Secondary | ICD-10-CM | POA: Diagnosis not present

## 2016-06-14 DIAGNOSIS — E119 Type 2 diabetes mellitus without complications: Secondary | ICD-10-CM | POA: Insufficient documentation

## 2016-06-14 LAB — COMPREHENSIVE METABOLIC PANEL
ALT: 13 U/L — AB (ref 14–54)
AST: 20 U/L (ref 15–41)
Albumin: 3.6 g/dL (ref 3.5–5.0)
Alkaline Phosphatase: 60 U/L (ref 38–126)
Anion gap: 9 (ref 5–15)
BUN: 6 mg/dL (ref 6–20)
CHLORIDE: 102 mmol/L (ref 101–111)
CO2: 29 mmol/L (ref 22–32)
CREATININE: 0.54 mg/dL (ref 0.44–1.00)
Calcium: 8.8 mg/dL — ABNORMAL LOW (ref 8.9–10.3)
Glucose, Bld: 102 mg/dL — ABNORMAL HIGH (ref 65–99)
POTASSIUM: 3 mmol/L — AB (ref 3.5–5.1)
SODIUM: 140 mmol/L (ref 135–145)
Total Bilirubin: 0.7 mg/dL (ref 0.3–1.2)
Total Protein: 6.5 g/dL (ref 6.5–8.1)

## 2016-06-14 LAB — CBC WITH DIFFERENTIAL/PLATELET
Basophils Absolute: 0 10*3/uL (ref 0.0–0.1)
Basophils Relative: 0 %
Eosinophils Absolute: 0.1 10*3/uL (ref 0.0–0.7)
Eosinophils Relative: 2 %
HCT: 38.7 % (ref 36.0–46.0)
HEMOGLOBIN: 13.1 g/dL (ref 12.0–15.0)
LYMPHS ABS: 1.8 10*3/uL (ref 0.7–4.0)
LYMPHS PCT: 21 %
MCH: 32.2 pg (ref 26.0–34.0)
MCHC: 33.9 g/dL (ref 30.0–36.0)
MCV: 95.1 fL (ref 78.0–100.0)
Monocytes Absolute: 0.8 10*3/uL (ref 0.1–1.0)
Monocytes Relative: 10 %
NEUTROS ABS: 6 10*3/uL (ref 1.7–7.7)
NEUTROS PCT: 67 %
PLATELETS: 241 10*3/uL (ref 150–400)
RBC: 4.07 MIL/uL (ref 3.87–5.11)
RDW: 12.3 % (ref 11.5–15.5)
WBC: 8.8 10*3/uL (ref 4.0–10.5)

## 2016-06-14 NOTE — Progress Notes (Signed)
This is an acute visit.  Level care skilled.  [facility].  Chief complaint-acute visit secondary to hypokalemia.  History of present illness.  Patient's a pleasant 82 year old female recently admitted the facility.  She was having multiple falls at home and was thought she needed additional help.  Patient is admitted here for physical therapy and possible long-term placement.  Baseline labs were obtained today which appear relatively unremarkable except for potassium of 3.0.  She is not on potassium supplementation I do note she is on lisinopril for hypertension.  Currently she has no acute complaints she appears to be somewhat of a poor historian secondary to cognitive issues continues to be pleasant does not complaining any chest pain palpitations weakness or dizziness although she does have history of falls.  Past Medical History:  Diagnosis Date  . Asthma   . Dementia   . Diabetes mellitus   . H. pylori infection MAR 2014  . Hypertension   . Stroke [facility](HCC)         Past Surgical History:  Procedure Laterality Date  . CHOLECYSTECTOMY    . COLECTOMY     secondary to diverticulitis  . ESOPHAGOGASTRODUODENOSCOPY (EGD) WITH ESOPHAGEAL DILATION N/A 01/07/2013   ZOX:[id number]:Schatzki ring was found at the gastroesophageal junction/SINGLE DUODENAL DIVERTICULUM    reports that she has never smoked. She has never used smokeless tobacco. She reports that she does not drink alcohol or use drugs. Social History        Social History  . Marital status: Married    Spouse name: N/A  . Number of children: N/A  . Years of education: N/A      Occupational History  . Not on file.       Social History Main Topics  . Smoking status: Never Smoker  . Smokeless tobacco: Never Used  . Alcohol use No  . Drug use: No  . Sexual activity: Not on file       Other Topics Concern  . Not on file      Social History Narrative  . No narrative on file     Functional Status Survey:         Family History  Problem Relation Age of Onset  . Colon cancer Neg Hx         Health Maintenance  Topic Date Due  . TETANUS/TDAP  06/03/1951  . ZOSTAVAX  06/03/1994  . DEXA SCAN  06/04/1999  . PNA vac Low Risk Adult (1 of 2 - PCV13) 06/04/1999  . INFLUENZA VACCINE  09/15/2016 (Originally 05/16/2016)    No Known Allergies        Medication List           A .           aspirin 81 MG tablet Take 81 mg by mouth 2 (two) times daily.  citalopram 10 MG tablet Commonly known as:  CELEXA Take 10 mg by mouth daily.  lisinopril 20 MG tablet Commonly known as:  PRINIVIL,ZESTRIL Take 10 mg by mouth daily.  metoprolol 50 MG tablet Commonly known as:  LOPRESSOR Take 50 mg by mouth 2 (two) times daily.  mirtazapine 15 MG tablet Commonly known as:  REMERON Take 15 mg by mouth at bedtime.  omeprazole 40 MG capsule Commonly known as:  PRILOSEC Take 40 mg by mouth 2 (two) times daily.  PRODIGY TEST test strip Generic drug:  glucose blood 1 each by Other route as needed for other. Use as instructed  Review of Systems   Constitutional: Negative for chills, diaphoresis, fatigue, fever and unexpected weight change.  Respiratory: Negative for cough, chest tightness, shortness of breath and wheezing.   Cardiovascular: Negative for chest pain, palpitations and leg swelling.  Gastrointestinal: Negative for abdominal distention, abdominal pain, constipation, diarrhea and nausea.  Neurological: Negative for dizziness, syncope, light-headedness and numbness.  Psychiatric/Behavioral: Positive for confusion. Negative for hallucinations.                            She  is afebrile pulse of 58 on exam respirations of 18 Physical Exam  Constitutional: She appears well-developed.  HENT:  Head: Normocephalic.  Eyes:  Patient has Bruise around her Right eye which seems old. No redness in eye.  Cardiovascular:  Largely regular rhythm with occasional ectopic beat.   slightly bradycardic at 58 Pulmonary/Chest: Breath sounds normal. No respiratory distress. She has no wheezes. She has no rales.  Abdominal: Bowel sounds are normal. She exhibits no distension. There is no tenderness. There is no guarding.     Neurological: She is alert. Oriented to self appears to have some cognitive issues but this appears stable with Dr. Urban Gibson exam yesterday    Labs.  06/14/2016.  Sodium 140 potassium 3 BUN 6 creatinine 0.54.  WBC 8.8 hemoglobin 13.1 platelets 241.  Liver function tests within normal limits.  Assessment and plan.  #1 hypokalemia we do not have recent baseline labs to compare-however she will need supplementation Will give 40 mEq of potassium tonight and then continue 20 mEq a day recheck a lab in 2 days as well as next week to ensure improvement-also will check a magnesium level when her potassium is checked in 2 days.  Clinically she appears to be stable. I do note she is on lisinopril which one would expect be more at risk for hyperkalemia she is not on a diuretic  [id number]

## 2016-06-15 ENCOUNTER — Other Ambulatory Visit: Payer: Self-pay | Admitting: Internal Medicine

## 2016-06-15 LAB — VITAMIN D 25 HYDROXY (VIT D DEFICIENCY, FRACTURES): VIT D 25 HYDROXY: 75.6 ng/mL (ref 30.0–100.0)

## 2016-06-16 ENCOUNTER — Encounter: Payer: Self-pay | Admitting: Internal Medicine

## 2016-06-17 ENCOUNTER — Encounter (HOSPITAL_COMMUNITY)
Admission: RE | Admit: 2016-06-17 | Discharge: 2016-06-17 | Disposition: A | Discharge status: Home / Self Care | Payer: Medicare Other | Source: Skilled Nursing Facility | Attending: Internal Medicine | Admitting: Internal Medicine

## 2016-06-17 DIAGNOSIS — E119 Type 2 diabetes mellitus without complications: Secondary | ICD-10-CM | POA: Diagnosis present

## 2016-06-17 LAB — BASIC METABOLIC PANEL
ANION GAP: 9 (ref 5–15)
BUN: 8 mg/dL (ref 6–20)
CALCIUM: 9 mg/dL (ref 8.9–10.3)
CO2: 29 mmol/L (ref 22–32)
Chloride: 98 mmol/L — ABNORMAL LOW (ref 101–111)
Creatinine, Ser: 0.55 mg/dL (ref 0.44–1.00)
GFR calc Af Amer: >60 mL/min (ref >60)
Glucose, Bld: 129 mg/dL — ABNORMAL HIGH (ref 65–99)
POTASSIUM: 3.3 mmol/L — AB (ref 3.5–5.1)
SODIUM: 136 mmol/L (ref 135–145)

## 2016-06-17 LAB — MAGNESIUM: MAGNESIUM: 1.6 mg/dL — AB (ref 1.7–2.4)

## 2016-06-19 ENCOUNTER — Non-Acute Institutional Stay (SKILLED_NURSING_FACILITY): Payer: Medicare Other | Admitting: Internal Medicine

## 2016-06-19 DIAGNOSIS — E876 Hypokalemia: Secondary | ICD-10-CM

## 2016-06-19 DIAGNOSIS — I10 Essential (primary) hypertension: Secondary | ICD-10-CM | POA: Diagnosis not present

## 2016-06-19 DIAGNOSIS — M25511 Pain in right shoulder: Secondary | ICD-10-CM

## 2016-06-19 NOTE — Progress Notes (Signed)
This is an acute visit.  Level care skilled.  Facility [facility].  She complaint-acute visit secondary to right shoulder pain-also follow-up hypokalemia.  History of present illness.  Patient is a pleasant 82 year old female who is admitted from home secondary to increased weakness and fall risk.  She also had complaints of right shoulder pain but x-rays did not show any acute process which showed degenerative glenohumeral spurring.  She continues to complain of right shoulder pain and Tylenol apparently is not very effective.  She also was found to have a potassium of 3.0 on a recent lab we have supplemented this with 20 mEq of potassium twice a day for a couple days she is now on 20 mEq a day and potassium has risen up to 3.3 on lab done on 06/17/2016.  We also ordered a magnesium which is borderline low at 1.6.  Currently she is sitting in her wheelchair comfortably other than the shoulder pain does not really have any complaints  Past Medical History:  Diagnosis Date  . Asthma   . Dementia   . Diabetes mellitus   . H. pylori infection MAR 2014  . Hypertension   . Stroke [facility])         Past Surgical History:  Procedure Laterality Date  . CHOLECYSTECTOMY    . COLECTOMY     secondary to diverticulitis  . ESOPHAGOGASTRODUODENOSCOPY (EGD) WITH ESOPHAGEAL DILATION N/A 01/07/2013   [id number] ring was found at the gastroesophageal junction/SINGLE DUODENAL DIVERTICULUM   reports that she has never smoked. She has never used smokeless tobacco. She reports that she does not drink alcohol or use drugs. Social History        Social History  . Marital status: Married    Spouse name: N/A  . Number of children: N/A  . Years of education: N/A      Occupational History  . Not on file.       Social History Main Topics  . Smoking status: Never Smoker  . Smokeless tobacco: Never Used  . Alcohol use No  . Drug use: No  . Sexual  activity: Not on file       Other Topics Concern  . Not on file      Social History Narrative  . No narrative on file    Functional Status Survey:        Family History  Problem Relation Age of Onset  . Colon cancer Neg Hx         Health Maintenance  Topic Date Due  . TETANUS/TDAP  06/03/1951  . ZOSTAVAX  06/03/1994  . DEXA SCAN  06/04/1999  . PNA vac Low Risk Adult (1 of 2 - PCV13) 06/04/1999  . INFLUENZA VACCINE  09/15/2016 (Originally 05/16/2016)    No Known Allergies        Medication List           A .           aspirin81 MG tablet Take 81 mg by mouth 2 (two) times daily.  citalopram10 MG tablet Commonly known as: CELEXA Take 10 mg by mouth daily.  lisinopril20 MG tablet Commonly known as: PRINIVIL,ZESTRIL Take 10 mg by mouth daily.  metoprolol50 MG tablet Commonly known as: LOPRESSOR Take 50 mg by mouth 2 (two) times daily.  mirtazapine15 MG tablet Commonly known as: REMERON Take 15 mg by mouth at bedtime.  omeprazole40 MG capsule Commonly known as: PRILOSEC Take 40 mg by mouth 2 (two) times daily.  PRODIGY TESTtest strip Generic drug: glucose blood 1 each by Other route as needed for other. Use as instructed      Review of Systems   Constitutional: Negative for chills, diaphoresis, fatigue, feverand unexpected weight change.  Respiratory: Negative for cough, chest tightness, shortness of breathand wheezing.  Cardiovascular: Negative for chest pain, palpitationsand leg swelling.  Gastrointestinal: Negative for abdominal distention, abdominal pain, constipation, diarrheaand nausea.  Muscle- skeletal-positive for right shoulder discomfort  Neurological: Negative for dizziness, syncope, light-headednessand numbness.  Psychiatric/Behavioral: Positive for confusion. Negative for hallucinations.                           Temperature 98.1 pulse 72  respirations 20 blood pressure 121/68 Physical Exam Constitutional: She appears well-developed.  HENT:  Head: Normocephalic.  Eyes:  Patient has Bruise around her Right eye which seems old. No redness in eye. Cardiovascular:regular rhythm with no significant lower extremity edema  Pulmonary/Chest: Breath sounds normal. No respiratory distress. She has no wheezes. She has no rales.  Abdominal: Bowel sounds are normal. She exhibits no distension. There is no tenderness. There is no guarding.   Skeletal skeletal does have some pain with palpation more of her right upper mid arm area.  I do not note any increased edema or erythema or deformity-radial pulses intact capillary refill intact grip strength appears to be intact   Neurological: She is alert. Oriented to self appears to have some cognitive issues but this appears stable with Dr. Urban GibsonGupta's exam yesterday    Labs.  06/17/2016.  Magnesium   1.6.  Sodium 136 potassium 3.3 BUN 8 creatinine 0.55.    Assessment and plan.  #1-history of right shoulder discomfort-again previous x-ray did not show any acute process did show degenerative changes-she is only on Tylenol at this point will start tramadol 25 mg twice a day when necessary pain DC if this gives her relief-if still not effective will certainly have to reevaluate she did not appear to be in any distress at this point but is having some discomfort at times.  #2 hypokalemia-this is being supplemented and is nearing normalization at this point will continue the 20 mEq a day and recheck a metabolic panel later this week.  Also update next week to ensure stability.  Also do note a somewhat low magnesium level I.6 will supplement this slightly with 240 mg daily of magnesium and updated magnesium level next week as well.  Hypertension-she continues on Lopressor 50 mg twice a day and lisinopril 20 mg a day I do not see consistent elevations recent blood pressures 121/68  120/54-156/85 will have to keep an eye on this.  [id number]    06/14/2016.  Sodium 140 potassium 3 BUN 6 creatinine 0.54.  WBC 8.8 hemoglobin 13.1 platelets 241.  Liver function tests within normal limits.

## 2016-06-20 ENCOUNTER — Encounter (HOSPITAL_COMMUNITY)
Admission: RE | Admit: 2016-06-20 | Discharge: 2016-06-20 | Disposition: A | Discharge status: Home / Self Care | Payer: Medicare Other | Source: Skilled Nursing Facility | Attending: Internal Medicine | Admitting: Internal Medicine

## 2016-06-20 DIAGNOSIS — E119 Type 2 diabetes mellitus without complications: Secondary | ICD-10-CM | POA: Diagnosis not present

## 2016-06-20 LAB — BASIC METABOLIC PANEL
Anion gap: 8 (ref 5–15)
BUN: 8 mg/dL (ref 6–20)
CALCIUM: 8.7 mg/dL — AB (ref 8.9–10.3)
CO2: 28 mmol/L (ref 22–32)
CREATININE: 0.5 mg/dL (ref 0.44–1.00)
Chloride: 102 mmol/L (ref 101–111)
GFR calc non Af Amer: >60 mL/min (ref >60)
Glucose, Bld: 105 mg/dL — ABNORMAL HIGH (ref 65–99)
Potassium: 3.8 mmol/L (ref 3.5–5.1)
SODIUM: 138 mmol/L (ref 135–145)

## 2016-06-21 ENCOUNTER — Encounter: Payer: Self-pay | Admitting: Internal Medicine

## 2016-06-21 ENCOUNTER — Non-Acute Institutional Stay (SKILLED_NURSING_FACILITY): Payer: Medicare Other | Admitting: Internal Medicine

## 2016-06-21 DIAGNOSIS — R112 Nausea with vomiting, unspecified: Secondary | ICD-10-CM | POA: Diagnosis not present

## 2016-06-21 NOTE — Progress Notes (Signed)
Location:   [facility] Room Number: 115/W Place of Service:  SNF (31) Provider:  Conni Slipper, MD  Patient Care Team: Kirstie Peri, MD as PCP - General (Internal Medicine) West Bali, MD as Attending Physician (Gastroenterology)  Extended Emergency Contact Information Primary Emergency Contact: Wallis Mart Address: [address]          [place], [place] [address] [place] Home Phone: [phone] Mobile Phone: [phone] Relation: Spouse  Code Status:  Full Code Goals of care: Advanced Directive information Advanced Directives 06/21/2016  Does patient have an advance directive? Yes  Type of Advance Directive Healthcare Power of Attorney  Does patient want to make changes to advanced directive? No - Patient declined  Copy of advanced directive(s) in chart? Yes  Pre-existing out of facility DNR order (yellow form or pink MOST form) -     Chief Complaint  Patient presents with  . Acute Visit    HPI:  Pt is a 82 y.o. female seen today for an acute visit for Nausea and vomiting. Was unable to obtain history from patient. D/W the speech therapist. According to the nurses patient has been vomiting food after almost every meal. Patient was evaluated by Speech therapist and thought to not have swallowing problems. Reviewing her history patient does have H/O EGD with Dilatation done in 03/14. She has seen by GI before and had been prescribed Zofran before meals for vomiting.   Past Medical History:  Diagnosis Date  . Asthma   . Dementia   . Diabetes mellitus   . H. pylori infection MAR 2014  . Hypertension   . Stroke [facility])    Past Surgical History:  Procedure Laterality Date  . CHOLECYSTECTOMY    . COLECTOMY     secondary to diverticulitis  . ESOPHAGOGASTRODUODENOSCOPY (EGD) WITH ESOPHAGEAL DILATION N/A 01/07/2013   [id number] ring was found at the gastroesophageal junction/SINGLE DUODENAL DIVERTICULUM    No Known  Allergies  Current Outpatient Prescriptions on File Prior to Visit  Medication Sig Dispense Refill  . aspirin 81 MG tablet Take 81 mg by mouth 2 (two) times daily.    . citalopram (CELEXA) 10 MG tablet Take 10 mg by mouth daily.     Marland Kitchen glucose blood (PRODIGY TEST) test strip 1 each by Other route as needed for other. Use as instructed    . lisinopril (PRINIVIL,ZESTRIL) 20 MG tablet Take 20 mg by mouth daily.     . metoprolol (LOPRESSOR) 50 MG tablet Take 50 mg by mouth 2 (two) times daily.    . mirtazapine (REMERON) 15 MG tablet Take 15 mg by mouth at bedtime.    Marland Kitchen omeprazole (PRILOSEC) 40 MG capsule Take 40 mg by mouth 2 (two) times daily.     No current facility-administered medications on file prior to visit.      Review of Systems  Unable to perform ROS: Dementia     There is no immunization history on file for this patient. Pertinent  Health Maintenance Due  Topic Date Due  . DEXA SCAN  06/04/1999  . PNA vac Low Risk Adult (1 of 2 - PCV13) 06/04/1999  . INFLUENZA VACCINE  09/15/2016 (Originally 05/16/2016)   No flowsheet data found. Functional Status Survey:    Vitals:   06/21/16 1444  BP: 128/73  Pulse: 77  Resp: 18  Temp: 97.8 F (36.6 C)  TempSrc: Oral   There is no height or weight on file to calculate BMI. Physical  Exam  Constitutional: She appears well-developed.  HENT:  Head: Normocephalic.  Mouth/Throat: Oropharynx is clear and moist.  Cardiovascular: Normal rate, regular rhythm and normal heart sounds.   Pulmonary/Chest: Effort normal and breath sounds normal. No respiratory distress. She has no wheezes. She has no rales.  Abdominal: Soft. Bowel sounds are normal. She exhibits no distension. There is no tenderness. There is no guarding.    Labs reviewed:  Recent Labs  06/14/16 0800 06/17/16 0730 06/20/16 0700  NA 140 136 138  K 3.0* 3.3* 3.8  CL 102 98* 102  CO2 29 29 28   GLUCOSE 102* 129* 105*  BUN 6 8 8   CREATININE 0.54 0.55 0.50  CALCIUM  8.8* 9.0 8.7*  MG  --  1.6*  --     Recent Labs  06/14/16 0800  AST 20  ALT 13*  ALKPHOS 60  BILITOT 0.7  PROT 6.5  ALBUMIN 3.6    Recent Labs  06/14/16 0800  WBC 8.8  NEUTROABS 6.0  HGB 13.1  HCT 38.7  MCV 95.1  PLT 241       Significant Diagnostic Results in last 30 days:  Dg Shoulder Right  Result Date: 06/08/2016 CLINICAL DATA:  Falls in the last 2 weeks.  Right shoulder pain. EXAM: RIGHT SHOULDER - 2+ VIEW COMPARISON:  Chest radiograph of 04/06/2012 FINDINGS: Bony demineralization. Inferior glenoid spurring. No visible fracture. Soft tissue calcification in the right upper arm is likely benign/ incidental. The patient was unable to tolerate an axillary view. IMPRESSION: 1. Bony demineralization. 2. Degenerative glenohumeral spurring. 3. No fracture or dislocation identified. Electronically Signed   By: Gaylyn RongWalter  Liebkemann M.D.   On: 06/08/2016 18:33    Assessment/Plan  Intractable Nausea and vomiting.  Patient is already on PPI. Will start on Zofran before every meal three times a day. GI Consult for further evaluation for Dysphagia.   Family/ staff Communication:   Labs/tests ordered:

## 2016-06-23 ENCOUNTER — Encounter (HOSPITAL_COMMUNITY)
Admission: RE | Admit: 2016-06-23 | Discharge: 2016-06-23 | Disposition: A | Discharge status: Home / Self Care | Payer: Medicare Other | Source: Skilled Nursing Facility | Attending: *Deleted | Admitting: *Deleted

## 2016-06-23 DIAGNOSIS — E119 Type 2 diabetes mellitus without complications: Secondary | ICD-10-CM | POA: Diagnosis not present

## 2016-06-23 LAB — BASIC METABOLIC PANEL
ANION GAP: 7 (ref 5–15)
BUN: 13 mg/dL (ref 6–20)
CHLORIDE: 101 mmol/L (ref 101–111)
CO2: 29 mmol/L (ref 22–32)
Calcium: 8.9 mg/dL (ref 8.9–10.3)
Creatinine, Ser: 0.58 mg/dL (ref 0.44–1.00)
GFR calc Af Amer: >60 mL/min (ref >60)
GFR calc non Af Amer: >60 mL/min (ref >60)
Glucose, Bld: 103 mg/dL — ABNORMAL HIGH (ref 65–99)
Potassium: 4.1 mmol/L (ref 3.5–5.1)
Sodium: 137 mmol/L (ref 135–145)

## 2016-06-26 ENCOUNTER — Other Ambulatory Visit (HOSPITAL_COMMUNITY)
Admission: RE | Admit: 2016-06-26 | Discharge: 2016-06-26 | Disposition: A | Discharge status: Home / Self Care | Payer: Medicare Other | Source: Skilled Nursing Facility | Attending: Internal Medicine | Admitting: Internal Medicine

## 2016-06-26 DIAGNOSIS — F0390 Unspecified dementia without behavioral disturbance: Secondary | ICD-10-CM | POA: Insufficient documentation

## 2016-06-26 LAB — BASIC METABOLIC PANEL
ANION GAP: 6 (ref 5–15)
BUN: 18 mg/dL (ref 6–20)
CALCIUM: 8.5 mg/dL — AB (ref 8.9–10.3)
CO2: 30 mmol/L (ref 22–32)
Chloride: 101 mmol/L (ref 101–111)
Creatinine, Ser: 0.63 mg/dL (ref 0.44–1.00)
Glucose, Bld: 109 mg/dL — ABNORMAL HIGH (ref 65–99)
Potassium: 3.9 mmol/L (ref 3.5–5.1)
Sodium: 137 mmol/L (ref 135–145)

## 2016-06-26 LAB — MAGNESIUM: Magnesium: 2 mg/dL (ref 1.7–2.4)

## 2016-06-29 ENCOUNTER — Encounter (HOSPITAL_COMMUNITY)
Admission: RE | Admit: 2016-06-29 | Discharge: 2016-06-29 | Disposition: A | Discharge status: Home / Self Care | Payer: Medicare Other | Source: Skilled Nursing Facility | Attending: *Deleted | Admitting: *Deleted

## 2016-06-29 DIAGNOSIS — E119 Type 2 diabetes mellitus without complications: Secondary | ICD-10-CM | POA: Diagnosis not present

## 2016-06-29 LAB — BASIC METABOLIC PANEL
ANION GAP: 9 (ref 5–15)
BUN: 15 mg/dL (ref 6–20)
CHLORIDE: 100 mmol/L — AB (ref 101–111)
CO2: 29 mmol/L (ref 22–32)
CREATININE: 0.6 mg/dL (ref 0.44–1.00)
Calcium: 8.8 mg/dL — ABNORMAL LOW (ref 8.9–10.3)
GFR calc non Af Amer: >60 mL/min (ref >60)
Glucose, Bld: 103 mg/dL — ABNORMAL HIGH (ref 65–99)
Potassium: 3.7 mmol/L (ref 3.5–5.1)
SODIUM: 138 mmol/L (ref 135–145)

## 2016-07-03 ENCOUNTER — Non-Acute Institutional Stay (SKILLED_NURSING_FACILITY): Payer: Medicare Other | Admitting: Internal Medicine

## 2016-07-03 ENCOUNTER — Encounter: Payer: Self-pay | Admitting: Internal Medicine

## 2016-07-03 DIAGNOSIS — W19XXXA Unspecified fall, initial encounter: Secondary | ICD-10-CM

## 2016-07-03 DIAGNOSIS — F0390 Unspecified dementia without behavioral disturbance: Secondary | ICD-10-CM | POA: Diagnosis not present

## 2016-07-03 DIAGNOSIS — R269 Unspecified abnormalities of gait and mobility: Secondary | ICD-10-CM | POA: Diagnosis not present

## 2016-07-03 NOTE — Progress Notes (Signed)
Location:   [facility] Room Number: 115/W Place of Service:  SNF (31) Provider:  Conni Slipper, MD  Patient Care Team: Kirstie Peri, MD as PCP - General (Internal Medicine) West Bali, MD as Attending Physician (Gastroenterology)  Extended Emergency Contact Information Primary Emergency Contact: Wallis Mart Address: [address]          [place], [place] [address] [place] Home Phone: [phone] Mobile Phone: [phone] Relation: Spouse  Code Status:  Full Code Goals of care: Advanced Directive information Advanced Directives 07/03/2016  Does patient have an advance directive? Yes  Type of Advance Directive Healthcare Power of Attorney  Does patient want to make changes to advanced directive? No - Patient declined  Copy of advanced directive(s) in chart? Yes  Pre-existing out of facility DNR order (yellow form or pink MOST form) -     Chief Complaint  Patient presents with  . Acute Visit    Follow-up from fall    HPI:  Pt is a 82 y.o. female seen today for an acute visit for Fall. According to the nurse patient was trying to get up from the Wheel chair and fell yesterday evening. Patient also said she was trying to get up and her legs got tangled. She was on Neuro checks q 2 hours. Today she was only c/o headache. She continues to get nausea after eating. She was otherwise alert. Did not have c/o pain anywhere else.   Past Medical History:  Diagnosis Date  . Asthma   . Dementia   . Diabetes mellitus   . H. pylori infection MAR 2014  . Hypertension   . Stroke [facility])    Past Surgical History:  Procedure Laterality Date  . CHOLECYSTECTOMY    . COLECTOMY     secondary to diverticulitis  . ESOPHAGOGASTRODUODENOSCOPY (EGD) WITH ESOPHAGEAL DILATION N/A 01/07/2013   [id number] ring was found at the gastroesophageal junction/SINGLE DUODENAL DIVERTICULUM    No Known Allergies  Current Outpatient Prescriptions on  File Prior to Visit  Medication Sig Dispense Refill  . acetaminophen (TYLENOL) 325 MG tablet Take 325 mg by mouth every 6 (six) hours as needed.    Marland Kitchen aspirin 81 MG tablet Take 81 mg by mouth 2 (two) times daily.    Marland Kitchen glucose blood (PRODIGY TEST) test strip 1 each by Other route as needed for other. Use as instructed    . lisinopril (PRINIVIL,ZESTRIL) 20 MG tablet Take 20 mg by mouth daily.     . Magnesium Oxide 250 MG TABS Take 1 tablet by mouth once day    . metoprolol (LOPRESSOR) 50 MG tablet Take 50 mg by mouth 2 (two) times daily.    . mirtazapine (REMERON) 15 MG tablet Take 15 mg by mouth at bedtime.    . Nutritional Supplements (ENSURE CLEAR PO) Give ensure clear ( apple ) daily due to decreased appetite once a day    . omeprazole (PRILOSEC) 40 MG capsule Take 40 mg by mouth 2 (two) times daily.    . ondansetron (ZOFRAN) 4 MG tablet Take 4 mg by mouth every 8 (eight) hours as needed for nausea or vomiting.    . traMADol (ULTRAM) 50 MG tablet Take 50 mg by mouth 2 (two) times daily as needed.     No current facility-administered medications on file prior to visit.      Review of Systems  Constitutional: Negative.   Respiratory: Negative.   Cardiovascular: Negative.   Gastrointestinal: Negative.  There is no immunization history on file for this patient. Pertinent  Health Maintenance Due  Topic Date Due  . DEXA SCAN  06/04/1999  . PNA vac Low Risk Adult (1 of 2 - PCV13) 06/04/1999  . INFLUENZA VACCINE  09/15/2016 (Originally 05/16/2016)   No flowsheet data found. Functional Status Survey:    Vitals:   07/03/16 1406  BP: (!) 145/81  Pulse: 63  Resp: 18  Temp: 97.8 F (36.6 C)  TempSrc: Oral   There is no height or weight on file to calculate BMI. Physical Exam  Constitutional: She appears well-nourished.  HENT:  Head: Normocephalic.  Eyes: Pupils are equal, round, and reactive to light.  Cardiovascular: Normal rate and regular rhythm.   Pulmonary/Chest:  Effort normal and breath sounds normal. No respiratory distress. She has no wheezes. She has no rales.  Abdominal: Soft. Bowel sounds are normal. There is no tenderness. There is no rebound and no guarding.  Neurological: She is alert.  Patient had equal strength in all extremities. She was alert and actually responding reasonable today.    Labs reviewed:  Recent Labs  06/17/16 0730  06/23/16 0800 06/26/16 0627 06/29/16 0710  NA 136  < > 137 137 138  K 3.3*  < > 4.1 3.9 3.7  CL 98*  < > 101 101 100*  CO2 29  < > 29 30 29   GLUCOSE 129*  < > 103* 109* 103*  BUN 8  < > 13 18 15   CREATININE 0.55  < > 0.58 0.63 0.60  CALCIUM 9.0  < > 8.9 8.5* 8.8*  MG 1.6*  --   --  2.0  --   < > = values in this interval not displayed.  Recent Labs  06/14/16 0800  AST 20  ALT 13*  ALKPHOS 60  BILITOT 0.7  PROT 6.5  ALBUMIN 3.6    Recent Labs  06/14/16 0800  WBC 8.8  NEUTROABS 6.0  HGB 13.1  HCT 38.7  MCV 95.1  PLT 241   No results found for: TSH No results found for: HGBA1C No results found for: CHOL, HDL, LDLCALC, LDLDIRECT, TRIG, CHOLHDL  Significant Diagnostic Results in last 30 days:  Dg Shoulder Right  Result Date: 06/08/2016 CLINICAL DATA:  Falls in the last 2 weeks.  Right shoulder pain. EXAM: RIGHT SHOULDER - 2+ VIEW COMPARISON:  Chest radiograph of 04/06/2012 FINDINGS: Bony demineralization. Inferior glenoid spurring. No visible fracture. Soft tissue calcification in the right upper arm is likely benign/ incidental. The patient was unable to tolerate an axillary view. IMPRESSION: 1. Bony demineralization. 2. Degenerative glenohumeral spurring. 3. No fracture or dislocation identified. Electronically Signed   By: Gaylyn RongWalter  Liebkemann M.D.   On: 06/08/2016 18:33    Assessment/Plan  Fall  Do not see any reason for fall. Looks more like patient just fell trying to get up from the wheel chair. Will check change neuro checks to Q 6 Hours for 24 hours.  Continue fall  precautions.   Family/ staff Communication:   Labs/tests ordered:

## 2016-07-06 ENCOUNTER — Non-Acute Institutional Stay (SKILLED_NURSING_FACILITY): Payer: Medicare Other | Admitting: Internal Medicine

## 2016-07-06 ENCOUNTER — Ambulatory Visit (INDEPENDENT_AMBULATORY_CARE_PROVIDER_SITE_OTHER): Payer: Medicare Other | Admitting: Nurse Practitioner

## 2016-07-06 ENCOUNTER — Encounter: Payer: Self-pay | Admitting: Nurse Practitioner

## 2016-07-06 ENCOUNTER — Encounter: Payer: Self-pay | Admitting: Internal Medicine

## 2016-07-06 VITALS — BP 157/76 | HR 64 | Temp 98.3°F | Ht 65.0 in

## 2016-07-06 DIAGNOSIS — R112 Nausea with vomiting, unspecified: Secondary | ICD-10-CM

## 2016-07-06 DIAGNOSIS — F0390 Unspecified dementia without behavioral disturbance: Secondary | ICD-10-CM | POA: Diagnosis not present

## 2016-07-06 NOTE — Progress Notes (Signed)
Location:   [facility] Room Number: 115/W Place of Service:  SNF (31) Provider:  Conni Slipper, MD  Patient Care Team: Kirstie Peri, MD as PCP - General (Internal Medicine) West Bali, MD as Attending Physician (Gastroenterology)  Extended Emergency Contact Information Primary Emergency Contact: Wallis Mart Address: [address]          [place], [place] [address] [place] Home Phone: [phone] Mobile Phone: [phone] Relation: Spouse Secondary Emergency Contact: [name] Home Phone: [phone] Relation: Son  Code Status:  Full Code Goals of care: Advanced Directive information Advanced Directives 07/06/2016  Does patient have an advance directive? Yes  Type of Advance Directive Healthcare Power of Attorney  Does patient want to make changes to advanced directive? No - Patient declined  Copy of advanced directive(s) in chart? Yes  Pre-existing out of facility DNR order (yellow form or pink MOST form) -     Chief Complaint  Patient presents with  . Acute Visit    For Follow up from her GI visit.    HPI:  Pt is a 82 y.o. female seen today for an acute visit as patient went to see Gastroenterologist and they wanted her to be on nasal decongestant. To see if that would help her vomiting. Patient has been throwing up after eating number of times as noticed by staff in spite of Zofran with every meal..But she denied any symptoms to GI today. She does have history of dementia and seems to get confused. She did tell me that her nose runs a lot. Denied any cough or SOB. Patients weight has been stable and actually has gained 2 lbs in facility.   Past Medical History:  Diagnosis Date  . Asthma   . Dementia   . Diabetes mellitus   . H. pylori infection MAR 2014  . Hypertension   . Stroke [facility])    Past Surgical History:  Procedure Laterality Date  . CHOLECYSTECTOMY    . COLECTOMY     secondary to diverticulitis  . ESOPHAGOGASTRODUODENOSCOPY (EGD) WITH ESOPHAGEAL DILATION N/A 01/07/2013   [id number] ring was found at the gastroesophageal junction/SINGLE DUODENAL DIVERTICULUM    No Known Allergies  Current Outpatient Prescriptions on File Prior to Visit  Medication Sig Dispense Refill  . acetaminophen (TYLENOL) 325 MG tablet Take 325 mg by mouth every 6 (six) hours as needed.    Marland Kitchen aspirin 81 MG tablet Take 81 mg by mouth daily.     Marland Kitchen lisinopril (PRINIVIL,ZESTRIL) 20 MG tablet Take 40 mg by mouth daily.     . Magnesium Oxide 250 MG TABS Take 1 tablet by mouth once day    . metoprolol (LOPRESSOR) 50 MG tablet Take 50 mg by mouth 2 (two) times daily.    . mirtazapine (REMERON) 15 MG tablet Take 15 mg by mouth at bedtime.    . Nutritional Supplements (ENSURE CLEAR PO) Give ensure clear ( apple ) daily due to decreased appetite once a day    . omeprazole (PRILOSEC) 40 MG capsule Take 40 mg by mouth 2 (two) times daily.    . ondansetron (ZOFRAN) 4 MG tablet Take 4 mg by mouth every 8 (eight) hours as needed for nausea or vomiting.    . traMADol (ULTRAM) 50 MG tablet 50 mg. Takes 1/2 tab bid prn     No current facility-administered medications on file prior to visit.     Review of Systems  Reason unable  to perform ROS: Not reliable due to demntia.  HENT: Positive for congestion, postnasal drip and rhinorrhea.   Respiratory: Negative for apnea, cough, choking, chest tightness and shortness of breath.   Cardiovascular: Negative for chest pain, palpitations and leg swelling.  Gastrointestinal: Negative for abdominal distention, abdominal pain, anal bleeding, constipation, diarrhea, nausea and rectal pain.     There is no immunization history on file for this patient. Pertinent  Health Maintenance Due  Topic Date Due  . DEXA SCAN  06/04/1999  . PNA vac Low Risk Adult (1 of 2 - PCV13) 06/04/1999  . INFLUENZA VACCINE  09/15/2016 (Originally 05/16/2016)   No flowsheet data  found. Functional Status Survey:    Vitals:   07/06/16 1404  BP: 122/70  Pulse: 72  Resp: 20  Temp: 97.4 F (36.3 C)  TempSrc: Oral   There is no height or weight on file to calculate BMI. Physical Exam  Constitutional: She appears well-developed and well-nourished.  HENT:  Head: Normocephalic.  Mouth/Throat: Oropharynx is clear and moist. No oropharyngeal exudate.  Cardiovascular: Normal rate, regular rhythm and normal heart sounds.   Pulmonary/Chest: Effort normal and breath sounds normal.  Abdominal: Soft. Bowel sounds are normal. She exhibits no distension and no mass. There is no tenderness. There is no rebound and no guarding.  Musculoskeletal: She exhibits no edema.  Neurological: She is alert.    Labs reviewed:  Recent Labs  06/17/16 0730  06/23/16 0800 06/26/16 0627 06/29/16 0710  NA 136  < > 137 137 138  K 3.3*  < > 4.1 3.9 3.7  CL 98*  < > 101 101 100*  CO2 29  < > 29 30 29   GLUCOSE 129*  < > 103* 109* 103*  BUN 8  < > 13 18 15   CREATININE 0.55  < > 0.58 0.63 0.60  CALCIUM 9.0  < > 8.9 8.5* 8.8*  MG 1.6*  --   --  2.0  --   < > = values in this interval not displayed.  Recent Labs  06/14/16 0800  AST 20  ALT 13*  ALKPHOS 60  BILITOT 0.7  PROT 6.5  ALBUMIN 3.6    Recent Labs  06/14/16 0800  WBC 8.8  NEUTROABS 6.0  HGB 13.1  HCT 38.7  MCV 95.1  PLT 241   No results found for: TSH No results found for: HGBA1C No results found for: CHOL, HDL, LDLCALC, LDLDIRECT, TRIG, CHOLHDL  Significant Diagnostic Results in last 30 days:  Dg Shoulder Right  Result Date: 06/08/2016 CLINICAL DATA:  Falls in the last 2 weeks.  Right shoulder pain. EXAM: RIGHT SHOULDER - 2+ VIEW COMPARISON:  Chest radiograph of 04/06/2012 FINDINGS: Bony demineralization. Inferior glenoid spurring. No visible fracture. Soft tissue calcification in the right upper arm is likely benign/ incidental. The patient was unable to tolerate an axillary view. IMPRESSION: 1. Bony  demineralization. 2. Degenerative glenohumeral spurring. 3. No fracture or dislocation identified. Electronically Signed   By: Gaylyn RongWalter  Liebkemann M.D.   On: 06/08/2016 18:33    Assessment/Plan  History Of Nausea and Vomiting Questionable Dysphagia.  [facility] Increase the dose of Zofran to 4 mg with meals and at night. If it persists will get Upper GI series to rule out dysphagia.   Family/ staff Communication:   Labs/tests ordered:

## 2016-07-06 NOTE — Patient Instructions (Signed)
1. Consider adding a second generation antihistamine and/or a seasonal allergy nasal spray to see if that helps with drainage and subsequently her nausea. 2. Schedule Zofran 4 mg 3 times a day as needed and at bedtime. Historically this is working well for her in the past. 3. Return for follow-up if no improvement in symptoms and further workup is deemed necessary.

## 2016-07-06 NOTE — Progress Notes (Signed)
cc'ed to pcp °

## 2016-07-06 NOTE — Assessment & Plan Note (Signed)
Chronic multiyear history of nausea. Per son he thinks that she has had an increase in her nausea as of the past couple weeks. The patient denies any nausea recently. She does admit seasonal allergies with significant, copious drainage. She almost seems to be coughing and choking on her sinus drainage today during her exam. We will make sure her Zofran scheduled for 4 mg 3 times a day before meals and at bedtime. We'll recommend considering second generation antihistamine and/or nasal spray to see if controlling her allergy symptoms and drainage helps with her nausea. Return for follow-up as needed for persistent symptoms if this is ineffective to explore other workup.

## 2016-07-06 NOTE — Progress Notes (Signed)
Referring Provider: Kirstie Peri, MD Primary Care Physician:  Kirstie Peri, MD Primary GI:  Dr. Darrick Penna  Chief Complaint  Patient presents with  . Emesis    after eating, x2 weeks  . Diarrhea    HPI:   Sabrina [name] is a 82 y.o. female who presents For nausea, vomiting, diarrhea. She is accompanied by her son. She was last seen in our office 02/03/2014 for nausea and vomiting as well as weight loss. At that time she admitted a chronic history of nausea and vomiting chronic diarrhea with question of pancreatic insufficiency, provided with Creon in October 2014. There is also question of lactose intolerance. Her objective weight was unchanged from 2014 at that time. Zofran was sent and recently which improved her symptoms and her nausea and vomiting was due to mostly phlegm. At that time the patient and husband desire to have further refills through her primary care, she was offered for follow-up appointment but they declined.  Today she is accompanied by her son and currently resides [facility]. She currently has omeprazole prescribed as well as Zofran 4 mg before meals.  Today she states she's ok, not having any problems. Son states she was having some nausea yesterday and recently has had worsening nausea over the past 2 weeks, has been a chronic problem for years. She doesn't think she's been receiving nausea medication. Admits seasonal allergies and has "a lot of drainage all the time." She is at [facility] because of Alzheimers as her husband/primary care giver is at [facility] for a stroke and unale to care for the patient. Denies abdominal pain. Has "constant" diarrhea, states 5-6 bowel movements a day. Son is unaware if her stool has been tested for infection. Has had a colectomy with 80% colon removal with possible etiology. Denies any other upper or lower GI issues.  Past Medical History:  Diagnosis Date  . Asthma   . Dementia   . Diabetes mellitus   . H. pylori infection  MAR 2014  . Hypertension   . Stroke [facility])     Past Surgical History:  Procedure Laterality Date  . CHOLECYSTECTOMY    . COLECTOMY     secondary to diverticulitis  . ESOPHAGOGASTRODUODENOSCOPY (EGD) WITH ESOPHAGEAL DILATION N/A 01/07/2013   [id number] ring was found at the gastroesophageal junction/SINGLE DUODENAL DIVERTICULUM    No current outpatient prescriptions on file.   No current facility-administered medications for this visit.     Allergies as of 07/06/2016  . (No Known Allergies)    Family History  Problem Relation Age of Onset  . Colon cancer Neg Hx     Social History   Social History  . Marital status: Married    Spouse name: N/A  . Number of children: N/A  . Years of education: N/A   Social History Main Topics  . Smoking status: Never Smoker  . Smokeless tobacco: Never Used  . Alcohol use No  . Drug use: No  . Sexual activity: Not Asked   Other Topics Concern  . None   Social History Narrative  . None    Review of Systems: 10-point ROS negative except as per HPI.   Physical Exam: BP (!) 157/76   Pulse 64   Temp 98.3 F (36.8 C) (Oral)   Ht 5\' 5"  (1.651 m)  General:   Alert. Pleasant and cooperative. Well-nourished and well-developed. Seems slow/tired. Head:  Normocephalic and atraumatic. Eyes:  Without icterus, sclera clear and conjunctiva pink.  Ears:  Normal auditory acuity. Cardiovascular:  S1, S2 present without murmurs appreciated. Extremities without clubbing or edema. Respiratory:  Clear to auscultation bilaterally. No wheezes, rales, or rhonchi. No distress.  Gastrointestinal:  +BS, soft, non-tender and non-distended. No HSM noted. No guarding or rebound. No masses appreciated.  Rectal:  Deferred  Musculoskalatal:  Symmetrical without gross deformities. Neurologic:  Alert and oriented x4;  grossly normal neurologically. Psych:  Alert and cooperative. Normal mood and affect. Heme/Lymph/Immune: No excessive bruising  noted.    07/06/2016 8:45 AM   Disclaimer: This note was dictated with voice recognition software. Similar sounding words can inadvertently be transcribed and may not be corrected upon review.

## 2016-07-07 ENCOUNTER — Non-Acute Institutional Stay (SKILLED_NURSING_FACILITY): Payer: Medicare Other | Admitting: Internal Medicine

## 2016-07-07 DIAGNOSIS — R269 Unspecified abnormalities of gait and mobility: Secondary | ICD-10-CM

## 2016-07-07 DIAGNOSIS — R112 Nausea with vomiting, unspecified: Secondary | ICD-10-CM | POA: Diagnosis not present

## 2016-07-10 ENCOUNTER — Encounter (HOSPITAL_COMMUNITY)
Admission: RE | Admit: 2016-07-10 | Discharge: 2016-07-10 | Disposition: A | Discharge status: Home / Self Care | Payer: Medicare Other | Source: Skilled Nursing Facility | Attending: *Deleted | Admitting: *Deleted

## 2016-07-10 DIAGNOSIS — E119 Type 2 diabetes mellitus without complications: Secondary | ICD-10-CM | POA: Diagnosis not present

## 2016-07-10 LAB — CBC WITH DIFFERENTIAL/PLATELET
BASOS PCT: 1 %
Basophils Absolute: 0.1 10*3/uL (ref 0.0–0.1)
Eosinophils Absolute: 0.2 10*3/uL (ref 0.0–0.7)
Eosinophils Relative: 2 %
HEMATOCRIT: 38.6 % (ref 36.0–46.0)
HEMOGLOBIN: 12.8 g/dL (ref 12.0–15.0)
LYMPHS PCT: 19 %
Lymphs Abs: 1.6 10*3/uL (ref 0.7–4.0)
MCH: 32.4 pg (ref 26.0–34.0)
MCHC: 33.2 g/dL (ref 30.0–36.0)
MCV: 97.7 fL (ref 78.0–100.0)
MONOS PCT: 10 %
Monocytes Absolute: 0.8 10*3/uL (ref 0.1–1.0)
NEUTROS ABS: 5.7 10*3/uL (ref 1.7–7.7)
Neutrophils Relative %: 68 %
Platelets: 338 10*3/uL (ref 150–400)
RBC: 3.95 MIL/uL (ref 3.87–5.11)
RDW: 12.6 % (ref 11.5–15.5)
WBC: 8.4 10*3/uL (ref 4.0–10.5)

## 2016-07-10 LAB — BASIC METABOLIC PANEL
Anion gap: 9 (ref 5–15)
BUN: 12 mg/dL (ref 6–20)
CHLORIDE: 102 mmol/L (ref 101–111)
CO2: 27 mmol/L (ref 22–32)
CREATININE: 0.65 mg/dL (ref 0.44–1.00)
Calcium: 8.8 mg/dL — ABNORMAL LOW (ref 8.9–10.3)
GFR calc non Af Amer: >60 mL/min (ref >60)
Glucose, Bld: 99 mg/dL (ref 65–99)
POTASSIUM: 3.5 mmol/L (ref 3.5–5.1)
Sodium: 138 mmol/L (ref 135–145)

## 2016-07-11 ENCOUNTER — Telehealth: Payer: Self-pay

## 2016-07-11 NOTE — Telephone Encounter (Signed)
[facility] this [facility] and the doctor there is wanting her to have a UGI done. Please advise

## 2016-07-12 NOTE — Progress Notes (Signed)
This is an acute visit.  Level care skilled.  Facility [facility].  Chief complaint-acute visit follow-up fall also vomiting.  History of present illness.  Patient is a pleasant 82 year old female here for rehabilitation after admission from home for increased weakness with frequent falls.  She's had a number of falls apparently here in the facility and had one this evening apparently eased out of her wheelchair and apparently did not hit her head there was no loss of consciousness.  She is not complaining of any pain in this regards.  She also had an episode of vomiting shortly thereafter-however this is not new she's had some vomiting that was addressed by Dr. Chales Abrahams  and she has been started on Zofran this is been tried rate it up to 4 mg before meals-she is also seeing GI who recommended starting an antihistamine thinking possibly some drainage may be causing symptoms and she has been started on Claritin.  When I assessed her she did not appear to be in any distress after just vomiting-she appeared somewhat weak however however I did reevaluate her later in the evening and she was doing well lying in bed comfortably look to be feeling better again did not complain of any musculoskeletal discomfort  Past Medical History:  Diagnosis Date  . Asthma   . Dementia   . Diabetes mellitus   . H. pylori infection MAR 2014  . Hypertension   . Stroke [facility])         Past Surgical History:  Procedure Laterality Date  . CHOLECYSTECTOMY    . COLECTOMY     secondary to diverticulitis  . ESOPHAGOGASTRODUODENOSCOPY (EGD) WITH ESOPHAGEAL DILATION N/A 01/07/2013   [id number] ring was found at the gastroesophageal junction/SINGLE DUODENAL DIVERTICULUM    No Known Allergies        Current Outpatient Prescriptions on File Prior to Visit  Medication Sig Dispense Refill  . acetaminophen (TYLENOL) 325 MG tablet Take 325 mg by mouth every 6 (six) hours as needed.    Marland Kitchen  aspirin 81 MG tablet Take 81 mg by mouth daily.     Marland Kitchen lisinopril (PRINIVIL,ZESTRIL) 20 MG tablet Take 40 mg by mouth daily.     . Magnesium Oxide 250 MG TABS Take 1 tablet by mouth once day    . metoprolol (LOPRESSOR) 50 MG tablet Take 50 mg by mouth 2 (two) times daily.    . mirtazapine (REMERON) 15 MG tablet Take 15 mg by mouth at bedtime.    . Nutritional Supplements (ENSURE CLEAR PO) Give ensure clear ( apple ) daily due to decreased appetite once a day    . omeprazole (PRILOSEC) 40 MG capsule Take 40 mg by mouth 2 (two) times daily.    . ondansetron (ZOFRAN) 4 MG tablet Take 4 mg by mouth every 8 (eight) hours as needed for nausea or vomiting.    . traMADol (ULTRAM) 50 MG tablet 50 mg. Takes 1/2 tab bid prn    Of note she has been started on Claritin 10 mg daily   No current facility-administered medications on file prior to visit.     Review of Systems  Reason unable to perform ROS: Not reliable due to demntia.  In general does not complaining of any fever or chills.  Skin does not complain of rashes does have some old bruising under her right orbital area that appears to be resolving HENT: Negative for possibly some nasal drainage Respiratory: Negative for apnea, cough, choking, chest tightness and shortness  of breath.   Cardiovascular: Negative for chest pain, palpitations and leg swelling.  Gastrointestinal: Negative for abdominal distention, abdominal pain, anal bleeding, constipation, diarrhea, rectal pain-however does have some recurrent vomiting  Muscle skeletal is not really complaining of any joint pain status post fall  Neurologic negative for dizziness syncopal-type feelings headache or numbness.  Psych positive for cognitive dysfunction but is pleasant and cooperative    There is no immunization history on file for this patient.     Pertinent  Health Maintenance Due  Topic Date Due  . DEXA SCAN  06/04/1999  . PNA vac Low Risk Adult (1 of  2 - PCV13) 06/04/1999  . INFLUENZA VACCINE  09/15/2016 (Originally 05/16/2016)   No flowsheet data found. Functional Status Survey:    Temperature is 97.7 pulse 78 respirations 16 blood pressure 101/64 There is no height or weight on file to calculate BMI. Physical Exam  Constitutional: She appears well-developed and well-nourished. Initially saw her she looked a bit fatigued after vomiting episode but on reevaluation look like herself more energetic smiling no distress [place]in[place] warm and dry some old bruising under her right eye HENT:  Head: Normocephalic.  Mouth/Throat: Oropharynx is clear and moist. No oropharyngeal exudate.  Cardiovascular: Normal rate, regular rhythm and normal heart sounds.   Pulmonary/Chest: Effort normal and breath sounds normal.  Abdominal: Soft. Bowel sounds are normal. She exhibits no distension and no mass. There is no tenderness. There is no rebound and no guarding.  Musculoskeletal: She exhibits no edema as general frailty but I did not note any deformities other than arthritic no acute deformities does not have discomfort with gentle range of motion of her knees bilaterally does have some history of right shoulder pain but this is not new moves all extremities at baseline.  Neurological: She is alert. No lateralizing findings her speech is clear.  Psych she is pleasant cooperative with exam somewhat confused which is her baseline  Labs reviewed:  Recent Labs (within last 365 days)   Recent Labs  06/17/16 0730  06/23/16 0800 06/26/16 0627 06/29/16 0710  NA 136  < > 137 137 138  K 3.3*  < > 4.1 3.9 3.7  CL 98*  < > 101 101 100*  CO2 29  < > 29 30 29   GLUCOSE 129*  < > 103* 109* 103*  BUN 8  < > 13 18 15   CREATININE 0.55  < > 0.58 0.63 0.60  CALCIUM 9.0  < > 8.9 8.5* 8.8*  MG 1.6*  --   --  2.0  --   < > = values in this interval not displayed.    Recent Labs (within last 365 days)   Recent Labs  06/14/16 0800  AST 20  ALT 13*    ALKPHOS 60  BILITOT 0.7  PROT 6.5  ALBUMIN 3.6      Recent Labs (within last 365 days)   Recent Labs  06/14/16 0800  WBC 8.8  NEUTROABS 6.0  HGB 13.1  HCT 38.7  MCV 95.1  PLT 241     Recent Labs  No results found for: TSH   Recent Labs  No results found for: HGBA1C   Recent Labs  No results found for: CHOL, HDL, LDLCALC, LDLDIRECT, TRIG, CHOLHDL    #1-history of vomiting a-again she does have a fairly extensive history here interventions as noted above her Zofran as been increased she is now on Claritin at this point will monitor I did recheck her before  leaving this evening and she appeared to be comfortable and certainly no sign of distress at this point continue to monitor will update a metabolic panel and CBC tomorrow to keep an eye on her white count as well as electrolytes.  #2 history of fall again she does not appear to have sustained any apparent injury-continue to follow facility fall protocol.  [id number]  Significant Diagnostic Results in last 30 days:   Imaging Results  Dg Shoulder Right  Result Date: 06/08/2016 CLINICAL DATA:  Falls in the last 2 weeks.  Right shoulder pain. EXAM: RIGHT SHOULDER - 2+ VIEW COMPARISON:  Chest radiograph of 04/06/2012 FINDINGS: Bony demineralization. Inferior glenoid spurring. No visible fracture. Soft tissue calcification in the right upper arm is likely benign/ incidental. The patient was unable to tolerate an axillary view. IMPRESSION: 1. Bony demineralization. 2. Degenerative glenohumeral spurring. 3. No fracture or dislocation identified. Electronically Signed   By: Gaylyn RongWalter  Liebkemann M.D.   On: 06/08/2016 18:33     Assessment/Plan  History Of Nausea and Vomiting Questionable Dysphagia.  [facility]. Increase the dose of Zofran to 4 mg with meals and at night. If it persists will get Upper GI series to rule out dysphagia.   Family/ staff Communication:   Labs/tests  ordered:

## 2016-07-12 NOTE — Telephone Encounter (Signed)
Can we find out which UGI he means? BPE? Swallow evaluation? EGD?

## 2016-07-14 NOTE — Telephone Encounter (Signed)
I had the [facility] to fax over the order so we could see what the doctor wants. I placed it on EG desk.

## 2016-07-14 NOTE — Telephone Encounter (Signed)
Sabrina [name] said that the UGI is a xray test that the nursing home can order. I talked with the nursing and she is going to get that ordered.

## 2016-07-20 ENCOUNTER — Ambulatory Visit (HOSPITAL_COMMUNITY): Admit: 2016-07-20 | Payer: Medicare Other

## 2016-07-20 ENCOUNTER — Ambulatory Visit (HOSPITAL_COMMUNITY)
Admit: 2016-07-20 | Discharge: 2016-07-20 | Disposition: A | Discharge status: Home / Self Care | Payer: Medicare Other | Source: Ambulatory Visit | Attending: Internal Medicine | Admitting: Internal Medicine

## 2016-07-20 DIAGNOSIS — R1110 Vomiting, unspecified: Secondary | ICD-10-CM | POA: Insufficient documentation

## 2016-07-20 DIAGNOSIS — K224 Dyskinesia of esophagus: Secondary | ICD-10-CM | POA: Diagnosis not present

## 2016-07-20 DIAGNOSIS — R1310 Dysphagia, unspecified: Secondary | ICD-10-CM | POA: Diagnosis present

## 2016-07-25 ENCOUNTER — Non-Acute Institutional Stay (SKILLED_NURSING_FACILITY): Payer: Medicare Other | Admitting: Internal Medicine

## 2016-07-25 ENCOUNTER — Encounter: Payer: Self-pay | Admitting: Internal Medicine

## 2016-07-25 DIAGNOSIS — I10 Essential (primary) hypertension: Secondary | ICD-10-CM

## 2016-07-25 DIAGNOSIS — F329 Major depressive disorder, single episode, unspecified: Secondary | ICD-10-CM | POA: Insufficient documentation

## 2016-07-25 DIAGNOSIS — F0390 Unspecified dementia without behavioral disturbance: Secondary | ICD-10-CM

## 2016-07-25 DIAGNOSIS — F331 Major depressive disorder, recurrent, moderate: Secondary | ICD-10-CM | POA: Diagnosis not present

## 2016-07-25 DIAGNOSIS — K224 Dyskinesia of esophagus: Secondary | ICD-10-CM

## 2016-07-25 DIAGNOSIS — F32A Depression, unspecified: Secondary | ICD-10-CM | POA: Insufficient documentation

## 2016-07-25 DIAGNOSIS — F3289 Other specified depressive episodes: Secondary | ICD-10-CM | POA: Diagnosis not present

## 2016-07-25 NOTE — Progress Notes (Signed)
Location:   [facility] Room Number: 115/W Place of Service:  SNF (31) Provider:  Conni SlipperAnjali,Gupta  SHAH,[name], MD  Patient Care Team: Kirstie PeriAshish Shah, MD as PCP - General (Internal Medicine) West BaliSandi L Fields, MD as Attending Physician (Gastroenterology)  Extended Emergency Contact Information Primary Emergency Contact: Wallis MartGarcia,James A Address: [address]          [place], [place] [address] [place] Home Phone: [phone] Mobile Phone: [phone] Relation: Spouse Secondary Emergency Contact: [name],[name]  [place] Home Phone: [phone] Relation: Son  Code Status:   Goals of care: Advanced Directive information Advanced Directives 07/25/2016  Does patient have an advance directive? Yes  Type of Advance Directive Healthcare Power of Attorney  Does patient want to make changes to advanced directive? No - Patient declined  Copy of advanced directive(s) in chart? Yes  Pre-existing out of facility DNR order (yellow form or pink MOST form) -     Chief Complaint  Patient presents with  . Medical Management of Chronic Issues    Routine Visit    HPI:  Pt is a 82 y.o. female seen today for medical management of chronic diseases.  Patient has h/o Hypertension, recurrent vominting, dementia and recurrent falls at home with h/o fracture. Patient has been stable for past few weeks in the facility,. According to the nurses she is not having anymore vomiting. Her weight is stable at 113 lbs. She was evaluated by GI and also had U GI series done for continuous vomiting . The UGI showed Esophageal dysmotility as a cause of vomiting. She has been on standing dose of Zofran which has helped her vomiting.she is also on Namenda for her dementia.  Patient is not able to give good history due to her dementia. But she seemed very down and when I asked her she said she has been feeling depressed. Her husband in the room is hospice care but patient does  not understand or knows any thing.   Past Medical History:  Diagnosis Date  . Asthma   . Dementia   . Diabetes mellitus   . H. pylori infection MAR 2014  . Hypertension   . Stroke [facility])    Past Surgical History:  Procedure Laterality Date  . CHOLECYSTECTOMY    . COLECTOMY     secondary to diverticulitis  . ESOPHAGOGASTRODUODENOSCOPY (EGD) WITH ESOPHAGEAL DILATION N/A 01/07/2013   [id number]:Schatzki ring was found at the gastroesophageal junction/SINGLE DUODENAL DIVERTICULUM    No Known Allergies  Current Outpatient Prescriptions on File Prior to Visit  Medication Sig Dispense Refill  . acetaminophen (TYLENOL) 325 MG tablet Take 325 mg by mouth every 6 (six) hours as needed.    Marland Kitchen. aspirin 81 MG tablet Take 81 mg by mouth daily.     Marland Kitchen. lisinopril (PRINIVIL,ZESTRIL) 20 MG tablet Take 40 mg by mouth daily.     . Magnesium Oxide 250 MG TABS Take 1 tablet by mouth once day    . memantine (NAMENDA TITRATION PAK) tablet pack See admin instructions. 5 mg/day for =1 week; 5 mg twice daily for =1 week; 15 mg/day given in 5 mg and 10 mg separated doses for =1 week; then 10 mg twice daily    . metoprolol (LOPRESSOR) 50 MG tablet Take 50 mg by mouth 2 (two) times daily.    . mirtazapine (REMERON) 15 MG tablet Take 15 mg by mouth at bedtime.    . Nutritional Supplements (ENSURE CLEAR PO) Give ensure clear (  apple ) daily due to decreased appetite once a day    . omeprazole (PRILOSEC) 40 MG capsule Take 40 mg by mouth 2 (two) times daily.    . ondansetron (ZOFRAN) 4 MG tablet Take 4 mg by mouth every 8 (eight) hours as needed for nausea or vomiting. Before meals    . traMADol (ULTRAM) 50 MG tablet 50 mg. Takes 1/2 tab bid prn     No current facility-administered medications on file prior to visit.     Review of Systems  Unable to perform ROS: Dementia    Immunization History  Administered Date(s) Administered  . Influenza-Unspecified 07/20/2016   Pertinent  Health Maintenance Due  Topic Date  Due  . DEXA SCAN  06/04/1999  . PNA vac Low Risk Adult (1 of 2 - PCV13) 06/04/1999  . INFLUENZA VACCINE  Completed   No flowsheet data found. Functional Status Survey:    Vitals:   07/25/16 1136  BP: (!) 154/59  Pulse: 64  Resp: 20  Temp: 97.6 F (36.4 C)  TempSrc: Oral   There is no height or weight on file to calculate BMI. Physical Exam  Constitutional: She appears well-developed and well-nourished.  HENT:  Mouth/Throat: Oropharynx is clear and moist.  Cardiovascular: Normal rate, regular rhythm and normal heart sounds.   Pulmonary/Chest: Effort normal and breath sounds normal. No respiratory distress. She has no wheezes. She has no rales. She exhibits no tenderness.  Abdominal: Soft. Bowel sounds are normal. She exhibits no distension. There is no tenderness. There is no rebound and no guarding.  Musculoskeletal: She exhibits no edema.  Neurological: She is alert.  Psychiatric: She exhibits a depressed mood.    Labs reviewed:  Recent Labs  06/17/16 0730  06/26/16 0627 06/29/16 0710 07/10/16 0640  NA 136  < > 137 138 138  K 3.3*  < > 3.9 3.7 3.5  CL 98*  < > 101 100* 102  CO2 29  < > 30 29 27   GLUCOSE 129*  < > 109* 103* 99  BUN 8  < > 18 15 12   CREATININE 0.55  < > 0.63 0.60 0.65  CALCIUM 9.0  < > 8.5* 8.8* 8.8*  MG 1.6*  --  2.0  --   --   < > = values in this interval not displayed.  Recent Labs  06/14/16 0800  AST 20  ALT 13*  ALKPHOS 60  BILITOT 0.7  PROT 6.5  ALBUMIN 3.6    Recent Labs  06/14/16 0800 07/10/16 0640  WBC 8.8 8.4  NEUTROABS 6.0 5.7  HGB 13.1 12.8  HCT 38.7 38.6  MCV 95.1 97.7  PLT 241 338   No results found for: TSH No results found for: HGBA1C No results found for: CHOL, HDL, LDLCALC, LDLDIRECT, TRIG, CHOLHDL  Significant Diagnostic Results in last 30 days:  Dg Ugi  W/kub  Result Date: 07/20/2016 CLINICAL DATA:  Patient with primary complaints of dysphagia and vomiting. History of dementia. EXAM: UPPER GI SERIES  WITH KUB TECHNIQUE: After obtaining a scout radiograph a routine upper GI series was performed using thin barium. FLUOROSCOPY TIME:  Fluoroscopy Time:  1 minutes and 24 seconds Radiation Exposure Index (if provided by the fluoroscopic device): 678.31 micro Gy per meter squared. Number of Acquired Spot Images: 9 COMPARISON:  None. FINDINGS: Initial abdomen radiographs shows a normal bowel gas pattern. There changes from a previous cholecystectomy and left hip hemiarthroplasty. Pharyngeal swallowing function is unremarkable. There is a prominent cricopharyngeus muscle  indentation on the cervical esophagus. Remainder of the esophagus is normal in caliber. Esophagus is normal in course. No esophageal mass, stricture or evidence of inflammation. There is esophageal dysmotility with proximal escape and tertiary contractions. No reflux was documented during the exam. The stomach is normal in size and shape with a normal mucosal fold pattern. No stomach mass, erosion or ulceration. The duodenal bulb and duodenal C sweep are unremarkable. IMPRESSION: 1. Esophageal dysmotility, which is likely the source of the patient's dysphasia. 2. No esophageal mass, inflammation or stricture. 3. The stomach and duodenum are unremarkable. Electronically Signed   By: Amie Portland M.D.   On: 07/20/2016 10:17    Assessment/Plan  Nausea Vomiting Secondary to Esophageal Dysmotility.  UGI was negative for any strictures Patient has stabilized on chronic Zofran and Prilosec Will continue to monitor. Consider Prochlorperazine if symptoms recur.  Hypertension Patient BP slightly elevated today but other readings have shown systolic 130-140. Will continue to monitor on Lisinopril and Metoprolol.  Depression Will start her on Celexa. Dementia  Continue Namenda.  H/O multiple fractures  Patient Vit D was normal. Will start her  Calcium and Vitamin D Consider Osteoporosis therapy.      Family/ staff Communication:    Labs/tests ordered:

## 2016-07-28 ENCOUNTER — Ambulatory Visit: Payer: Medicare Other | Admitting: Gastroenterology

## 2016-08-18 ENCOUNTER — Encounter: Payer: Self-pay | Admitting: Internal Medicine

## 2016-08-18 ENCOUNTER — Non-Acute Institutional Stay (SKILLED_NURSING_FACILITY): Payer: Medicare Other | Admitting: Internal Medicine

## 2016-08-18 DIAGNOSIS — R112 Nausea with vomiting, unspecified: Secondary | ICD-10-CM | POA: Diagnosis not present

## 2016-08-18 DIAGNOSIS — R52 Pain, unspecified: Secondary | ICD-10-CM

## 2016-08-18 DIAGNOSIS — E876 Hypokalemia: Secondary | ICD-10-CM

## 2016-08-18 NOTE — Progress Notes (Signed)
Location:   [facility] Room Number: 115/W Place of Service:  SNF [phone]) Provider:  Vanice SarahArlo [name]  SHAH,[name], MD  Patient Care Team: Sabrina PeriAshish Shah, MD as PCP - General (Internal Medicine) Sabrina BaliSandi L Fields, MD as Attending Physician (Gastroenterology)  Extended Emergency Contact Information Primary Emergency Contact: Sabrina [name] Address: [address]          [place], [place] [address] [place] Home Phone: [phone] Mobile Phone: [phone] Relation: Spouse Secondary Emergency Contact: Sabrina [name]  [place] Home Phone: [phone] Relation: Son  Code Status:  Full Code Goals of care: Advanced Directive information Advanced Directives 08/18/2016  Does patient have an advance directive? Yes  Type of Advance Directive (No Data)  Does patient want to make changes to advanced directive? No - Patient declined  Copy of advanced directive(s) in chart? Yes  Pre-existing out of facility DNR order (yellow form or pink MOST form) -     Chief Complaint  Patient presents with  . Acute Visit    DM Ultram?    HPI:  Pt is [name] 82 y.o. female seen today for an acute visit for Follow-up of pain management.  She had been on low-dose tramadol when necessary 25 mg [name] day-but this was discontinued approximately 3 weeks ago.  Talking with patient she states she doesn't want to get hooked on any pain medication.  She says her pain mainly is in her back which is somewhat chronic and intermittent. She does have [name] history of left hip fracture and right humerus fracture as well the past but she is not really complaining of pain in this area  She does not actually have any when necessary pain medication currently.  Today she is sitting in her wheelchair comfortably denies pain but says at times her back will hur-she can be [name] poor historian however-t--the past as has [name] history of right shoulder pain as well previous x-rays did not show any acute  process did show degenerative changes t.     Past Medical History:  Diagnosis Date  . Asthma   . Dementia   . Diabetes mellitus   . H. pylori infection MAR 2014  . Hypertension   . Stroke [facility](HCC)    Past Surgical History:  Procedure Laterality Date  . CHOLECYSTECTOMY    . COLECTOMY     secondary to diverticulitis  . ESOPHAGOGASTRODUODENOSCOPY (EGD) WITH ESOPHAGEAL DILATION N/[name] 01/07/2013   EXB:[id number]:Schatzki ring was found at the gastroesophageal junction/SINGLE DUODENAL DIVERTICULUM    No Known Allergies  Current Outpatient Prescriptions on File Prior to Visit  Medication Sig Dispense Refill  . aspirin 81 MG tablet Take 81 mg by mouth daily.     Marland Kitchen. lisinopril (PRINIVIL,ZESTRIL) 20 MG tablet Take 40 mg by mouth daily.     Marland Kitchen. loratadine (CLARITIN) 10 MG tablet Take 10 mg by mouth daily.    . memantine (NAMENDA TITRATION PAK) tablet pack See admin instructions. 5 mg/day for =1 week; 5 mg twice daily for =1 week; 15 mg/day given in 5 mg and 10 mg separated doses for =1 week; then 10 mg twice daily    . metoprolol (LOPRESSOR) 50 MG tablet Take 50 mg by mouth 2 (two) times daily.    . mirtazapine (REMERON) 15 MG tablet Take 15 mg by mouth at bedtime.    . Nutritional Supplements (ENSURE CLEAR PO) Give ensure clear ( apple ) daily due to decreased appetite once [name] day    . omeprazole (  PRILOSEC) 40 MG capsule Take 40 mg by mouth 2 (two) times daily.    . ondansetron (ZOFRAN) 4 MG tablet Take 4 mg by mouth daily at 6 (six) AM. Before meals    . ondansetron (ZOFRAN) 4 MG tablet Take 4 mg by mouth at bedtime.     No current facility-administered medications on file prior to visit.     Review of Systems   This is somewhat limited secondary to dementia.  In general does not complaining of fever chills respiratory not complaining shortness breath or cough.  Cardiac no chest pain.  GI has had [name] history of nausea and vomiting but apparently this has responded very well to Zofran and this has  improved significantly.  Musculoskeletal at times she says her back will hurt previously complain of right shoulder discomfort but is not really complaining of that today.  Neurologic is not complaining of numbness or headache  Immunization History  Administered Date(s) Administered  . Influenza-Unspecified 07/20/2016  . Pneumococcal-Unspecified 07/24/2016   Pertinent  Health Maintenance Due  Topic Date Due  . DEXA SCAN  06/04/1999  . PNA vac Low Risk Adult (2 of 2 - PCV13) 07/24/2017  . INFLUENZA VACCINE  Completed   No flowsheet data found. Functional Status Survey:    Vitals:   08/18/16 1128  BP: (!) 116/57  Pulse: 94  Resp: 20  Temp: 97.6 F (36.4 [name])  TempSrc: Oral   There is no height or weight on file to calculate BMI. Physical Exam In general this is [name] pleasant elderly female in no distress sitting comfortably in her wheelchair.  Her skin is warm and dry.  Heart is regular rate and rhythm without murmur gallop or rub.  Chest is clear to auscultation there is no labored breathing.  Abdomen is soft nontender with positive bowel sounds  Musculoskeletal moves all her extremities at baseline there are arthritic changes but I do not note any deformities general range of motion of her knees does not really elicit pain-the same with her upper extremities he is able to flex and extend without discomfort-there are well-healed surgical scars  lower back but there does not appear to be acute tenderness to palpation of her back  Neurologic is grossly intact no lateralizing findings her speech is clear.  Psych she is oriented to self 10 kerion [name] short conversation does have confusion-did state that she did not want to be on any strong pain medication because she thought she might get dependent  Labs reviewed:  Recent Labs  06/17/16 0730  06/26/16 0627 06/29/16 0710 07/10/16 0640  NA 136  < > 137 138 138  K 3.3*  < > 3.9 3.7 3.5  CL 98*  < > 101 100* 102  CO2 29  <  > 30 29 27   GLUCOSE 129*  < > 109* 103* 99  BUN 8  < > 18 15 12   CREATININE 0.55  < > 0.63 0.60 0.65  CALCIUM 9.0  < > 8.5* 8.8* 8.8*  MG 1.6*  --  2.0  --   --   < > = values in this interval not displayed.  Recent Labs  06/14/16 0800  AST 20  ALT 13*  ALKPHOS 60  BILITOT 0.7  PROT 6.5  ALBUMIN 3.6    Recent Labs  06/14/16 0800 07/10/16 0640  WBC 8.8 8.4  NEUTROABS 6.0 5.7  HGB 13.1 12.8  HCT 38.7 38.6  MCV 95.1 97.7  PLT 241 338  No results found for: TSH No results found for: HGBA1C No results found for: CHOL, HDL, LDLCALC, LDLDIRECT, TRIG, CHOLHDL  Significant Diagnostic Results in last 30 days:  Dg Ugi  W/kub  Result Date: 07/20/2016 CLINICAL DATA:  Patient with primary complaints of dysphagia and vomiting. History of dementia. EXAM: UPPER GI SERIES WITH KUB TECHNIQUE: After obtaining [name] scout radiograph [name] routine upper GI series was performed using thin barium. FLUOROSCOPY TIME:  Fluoroscopy Time:  1 minutes and 24 seconds Radiation Exposure Index (if provided by the fluoroscopic device): 678.31 micro Gy per meter squared. Number of Acquired Spot Images: 9 COMPARISON:  None. FINDINGS: Initial abdomen radiographs shows [name] normal bowel gas pattern. There changes from [name] previous cholecystectomy and left hip hemiarthroplasty. Pharyngeal swallowing function is unremarkable. There is [name] prominent cricopharyngeus muscle indentation on the cervical esophagus. Remainder of the esophagus is normal in caliber. Esophagus is normal in course. No esophageal mass, stricture or evidence of inflammation. There is esophageal dysmotility with proximal escape and tertiary contractions. No reflux was documented during the exam. The stomach is normal in size and shape with [name] normal mucosal fold pattern. No stomach mass, erosion or ulceration. The duodenal bulb and duodenal [name] sweep are unremarkable. IMPRESSION: 1. Esophageal dysmotility, which is likely the source of the patient's dysphasia. 2. No  esophageal mass, inflammation or stricture. 3. The stomach and duodenum are unremarkable. Electronically Signed   By: Amie Portlandavid  Ormond M.D.   On: 07/20/2016 10:17    Assessment/Plan  #1 pain management-I did discuss this with nursing as well as with patient-she does not appear to have any when necessary pain medications currently will start with Tylenol 650 mg every 6 hours when necessary and monitor for effectiveness-patient states she would like to avoid stronger pain medication-but  If  this is not effective certainly will readdress and possibly restart her low-dose tramadol when necessary--she is not really complaining of pain when I visited her  -Note at times she does have [name] history of mild hypokalemia will update [name] metabolic panel to ensure stability--most recent potassium was 3.5-again will update this.   #3 nausea and vomiting-this has improved considerably she does continue on the Zofran which appears to be effective  [id number]-of note greater than 25 minutes spent assessing patient-discussing her status with nursing staff-reviewing her chart-her last-and coordinating plan of care-of note greater than 50% of time spent coordinating plan of care with chart review nursing input      London SheerLuster, Sabrina [name], [place] [phone]

## 2016-08-24 ENCOUNTER — Non-Acute Institutional Stay (SKILLED_NURSING_FACILITY): Payer: Medicare Other | Admitting: Internal Medicine

## 2016-08-24 ENCOUNTER — Encounter: Payer: Self-pay | Admitting: Internal Medicine

## 2016-08-24 DIAGNOSIS — K224 Dyskinesia of esophagus: Secondary | ICD-10-CM

## 2016-08-24 DIAGNOSIS — F0390 Unspecified dementia without behavioral disturbance: Secondary | ICD-10-CM | POA: Diagnosis not present

## 2016-08-24 DIAGNOSIS — S42294D Other nondisplaced fracture of upper end of right humerus, subsequent encounter for fracture with routine healing: Secondary | ICD-10-CM

## 2016-08-24 NOTE — Progress Notes (Signed)
Location:   [facility] Room Number: 115/W Place of Service:  SNF (31) Provider:  Vanice Sarah, MD  Patient Care Team: Kirstie Peri, MD as PCP - General (Internal Medicine) West Bali, MD as Attending Physician (Gastroenterology)  Extended Emergency Contact Information Primary Emergency Contact: Wallis Mart Address: [address]          [place], [place] [address] [place] Home Phone: [phone] Mobile Phone: [phone] Relation: Spouse Secondary Emergency Contact: [name] Home Phone: [phone] Relation: Son  Code Status:  Full Code Goals of care: Advanced Directive information Advanced Directives 08/24/2016  Does patient have an advance directive? Yes  Type of Advance Directive (No Data)  Does patient want to make changes to advanced directive? No - Patient declined  Copy of advanced directive(s) in chart? Yes  Pre-existing out of facility DNR order (yellow form or pink MOST form) -     Chief Complaint  Patient presents with  . Medical Management of Chronic Issues    Routine Visit  For medical management of chronic medical conditions including esophageal dysmotility-hypertension-depression-dementia-history of fractures.    HPI:  Pt is a 82 y.o. female seen today for medical management of chronic diseases as noted above.  She appears to be enjoying a stable..  She actually saw the dentist earlier this morning-some nursing staff had complain patient appeared to have bad breath-we are to do dental consult then recommendation is for thorough brushing-she is also scheduled to have some extractions done.  She is currently sitting in her wheelchair comfortably she has no complaints in the past she had had complaints of pain with a history of right humerus fracture as well as left hip issues but this is been stable for some time she is actually only receiving Tylenol for pain and this  appears to be effective.  She does have a history of esophageal dysmotility upper GI study was negative for any strictures this has stabilized on chronic Zofran and Prilosec.  Her weight appears to be stable to slowly gaining.  She also has a history of hypertension but this appears relatively well controlled recent blood pressures 109/54-147/69 130/81 do not really see consistent elevations will continue to monitor.  Regards to dementia she appears to be doing well with supportive care her weight again is stable to slowly gaining she has been started on Remeron she is on Namenda.  .     Past Medical History:  Diagnosis Date  . Asthma   . Dementia   . Diabetes mellitus   . H. pylori infection MAR 2014  . Hypertension   . Stroke [facility])    Past Surgical History:  Procedure Laterality Date  . CHOLECYSTECTOMY    . COLECTOMY     secondary to diverticulitis  . ESOPHAGOGASTRODUODENOSCOPY (EGD) WITH ESOPHAGEAL DILATION N/A 01/07/2013   MWU:[id number] ring was found at the gastroesophageal junction/SINGLE DUODENAL DIVERTICULUM    No Known Allergies  Current Outpatient Prescriptions on File Prior to Visit  Medication Sig Dispense Refill  . aspirin 81 MG tablet Take 81 mg by mouth daily.     . Calcium Carbonate-Vitamin D (CALCIUM 500/VITAMIN D PO) Take 500 mg by mouth 2 (two) times daily.    . citalopram (CELEXA) 20 MG tablet Take 20 mg by mouth daily.    Marland Kitchen lisinopril (PRINIVIL,ZESTRIL) 20 MG tablet Take 40 mg by mouth daily.     Marland Kitchen loratadine (CLARITIN) 10 MG tablet Take 10 mg  by mouth daily.    . memantine (NAMENDA TITRATION PAK) tablet pack See admin instructions. 5 mg/day for =1 week; 5 mg twice daily for =1 week; 15 mg/day given in 5 mg and 10 mg separated doses for =1 week; then 10 mg twice daily    . metoprolol (LOPRESSOR) 50 MG tablet Take 50 mg by mouth 2 (two) times daily.    . mirtazapine (REMERON) 15 MG tablet Take 15 mg by mouth at bedtime.    . Nutritional Supplements (ENSURE  CLEAR PO) Give ensure clear ( apple ) daily due to decreased appetite once a day    . omeprazole (PRILOSEC) 40 MG capsule Take 40 mg by mouth 2 (two) times daily.    . ondansetron (ZOFRAN) 4 MG tablet Take 4 mg by mouth daily at 6 (six) AM. Before meals    . ondansetron (ZOFRAN) 4 MG tablet Take 4 mg by mouth at bedtime.     No current facility-administered medications on file prior to visit.     Review of Systems   Very limited secondary to dementia but nursing staff has not really reported any acute issues no shortness of breath or complaints of pain Tylenol appears to be working that regards she has no complaints today  Immunization History  Administered Date(s) Administered  . Influenza-Unspecified 07/20/2016  . Pneumococcal-Unspecified 07/24/2016   Pertinent  Health Maintenance Due  Topic Date Due  . DEXA SCAN  06/04/1999  . PNA vac Low Risk Adult (2 of 2 - PCV13) 07/24/2017  . INFLUENZA VACCINE  Completed   No flowsheet data found. Functional Status Survey:      Physical Exam She is afebrile pulse is 65 respirations 20 blood pressures recently 145/69-109/54 in this range   In general this is a pleasant elderly female in no distress sitting comfortably in her wheelchair.  Her skin is warm and dry. Has a slight resolving hematoma left cheek area  Oropharynx is clear mucous membranes moist she does have some extractions mid lower mouth  Heart is regular rate and rhythm without murmur gallop or rub.  Chest is clear to auscultation there is no labored breathing.  Abdomen is soft nontender with positive bowel sounds  Musculoskeletal moves all her extremities at baseline there are arthritic changes but I do not note any deformities general range of motion of her knees does not really elicit pain-the same with her upper extremities he is able to flex and extend without discomfort-there are well-healed surgical scars  lower back  Neurologic is grossly intact no  lateralizing findings her speech is clear.  Psych she is oriented to self Pleasant and appropriate responds easily to verbal commands  Labs reviewed:  Recent Labs  06/17/16 0730  06/26/16 0627 06/29/16 0710 07/10/16 0640  NA 136  < > 137 138 138  K 3.3*  < > 3.9 3.7 3.5  CL 98*  < > 101 100* 102  CO2 29  < > 30 29 27   GLUCOSE 129*  < > 109* 103* 99  BUN 8  < > 18 15 12   CREATININE 0.55  < > 0.63 0.60 0.65  CALCIUM 9.0  < > 8.5* 8.8* 8.8*  MG 1.6*  --  2.0  --   --   < > = values in this interval not displayed.  Recent Labs  06/14/16 0800  AST 20  ALT 13*  ALKPHOS 60  BILITOT 0.7  PROT 6.5  ALBUMIN 3.6    Recent Labs  06/14/16 0800 07/10/16 0640  WBC 8.8 8.4  NEUTROABS 6.0 5.7  HGB 13.1 12.8  HCT 38.7 38.6  MCV 95.1 97.7  PLT 241 338   No results found for: TSH No results found for: HGBA1C No results found for: CHOL, HDL, LDLCALC, LDLDIRECT, TRIG, CHOLHDL  Significant Diagnostic Results in last 30 days:  No results found.  Assessment/Plan   #1-history of esophageal dysmotility she appears to have stabilized quite well here on the Zofran 4 times a day as well as Prilosec-she appears to be slowly gaining weight she is on Remeron.  #2 hypertension as noted above there is some variability but I do not see consistent elevations at this point continue her Cipro 40 mg a day-as well as Lopressor 50 mg twice a day.  #3 depression this appears stable on Celexa this was recently started she appears to be doing well with that also again is on Remeron I suspect this is more for appetite stimulation.  #4 history of dementia this appears to be stable with supportive care she is receiving Namenda.  #5 history of multiple fractures her vitamin D level recently was within normal range she is on calcium with vitamin D-pain appears to be controlled with Tylenol she appears to be quite comfortable which is an improvement certainly from when she was initially  admitted.  #6-will order a BMP secondary to past history of mild hypokalemia would like to make sure this is stable.  Number 7H use as noted above she has seen the dentist this morning will have a revisit-there are orders for thorough brushing suspect will help with any breath issues.  Continue to monitor.    [id number]

## 2016-08-25 ENCOUNTER — Encounter (HOSPITAL_COMMUNITY)
Admission: RE | Admit: 2016-08-25 | Discharge: 2016-08-25 | Disposition: A | Discharge status: Home / Self Care | Payer: Medicare Other | Source: Skilled Nursing Facility | Attending: Internal Medicine | Admitting: Internal Medicine

## 2016-08-25 DIAGNOSIS — E119 Type 2 diabetes mellitus without complications: Secondary | ICD-10-CM | POA: Diagnosis not present

## 2016-08-25 LAB — BASIC METABOLIC PANEL
Anion gap: 5 (ref 5–15)
BUN: 12 mg/dL (ref 6–20)
CALCIUM: 8.8 mg/dL — AB (ref 8.9–10.3)
CHLORIDE: 103 mmol/L (ref 101–111)
CO2: 28 mmol/L (ref 22–32)
CREATININE: 0.6 mg/dL (ref 0.44–1.00)
Glucose, Bld: 140 mg/dL — ABNORMAL HIGH (ref 65–99)
Potassium: 3.3 mmol/L — ABNORMAL LOW (ref 3.5–5.1)
SODIUM: 136 mmol/L (ref 135–145)

## 2016-09-01 ENCOUNTER — Encounter (HOSPITAL_COMMUNITY)
Admission: RE | Admit: 2016-09-01 | Discharge: 2016-09-01 | Disposition: A | Discharge status: Home / Self Care | Payer: Medicare Other | Source: Skilled Nursing Facility | Attending: Internal Medicine | Admitting: Internal Medicine

## 2016-09-01 DIAGNOSIS — E119 Type 2 diabetes mellitus without complications: Secondary | ICD-10-CM | POA: Diagnosis not present

## 2016-09-01 LAB — MAGNESIUM: Magnesium: 1.9 mg/dL (ref 1.7–2.4)

## 2016-09-01 LAB — BASIC METABOLIC PANEL
ANION GAP: 6 (ref 5–15)
BUN: 14 mg/dL (ref 6–20)
CALCIUM: 8.8 mg/dL — AB (ref 8.9–10.3)
CO2: 28 mmol/L (ref 22–32)
CREATININE: 0.74 mg/dL (ref 0.44–1.00)
Chloride: 104 mmol/L (ref 101–111)
GFR calc Af Amer: >60 mL/min (ref >60)
GLUCOSE: 99 mg/dL (ref 65–99)
Potassium: 4.1 mmol/L (ref 3.5–5.1)
Sodium: 138 mmol/L (ref 135–145)

## 2016-09-21 ENCOUNTER — Non-Acute Institutional Stay (SKILLED_NURSING_FACILITY): Payer: Medicare Other | Admitting: Internal Medicine

## 2016-09-21 ENCOUNTER — Encounter: Payer: Self-pay | Admitting: Internal Medicine

## 2016-09-21 DIAGNOSIS — R05 Cough: Secondary | ICD-10-CM | POA: Diagnosis not present

## 2016-09-21 DIAGNOSIS — R059 Cough, unspecified: Secondary | ICD-10-CM

## 2016-09-21 DIAGNOSIS — R0989 Other specified symptoms and signs involving the circulatory and respiratory systems: Secondary | ICD-10-CM | POA: Diagnosis not present

## 2016-09-21 NOTE — Progress Notes (Signed)
Location:   [facility] Room Number: 115/W Place of Service:  SNF [phone]) Provider:  Vanice Sarah, MD  Patient Care Team: Kirstie Peri, MD as PCP - General (Internal Medicine) West Bali, MD as Attending Physician (Gastroenterology)  Extended Emergency Contact Information Primary Emergency Contact: Wallis Mart Address: [address]          [place], [place] [address] [place] Home Phone: [phone] Mobile Phone: [phone] Relation: Spouse Secondary Emergency Contact: [name] [place] Home Phone: [phone] Relation: Son  Code Status:  Full Code Goals of care: Advanced Directive information Advanced Directives 09/21/2016  Does Patient Have a Medical Advance Directive? Yes  Type of Advance Directive (No Data)  Does patient want to make changes to medical advance directive? No - Patient declined  Copy of Healthcare Power of Attorney in Chart? -  Pre-existing out of facility DNR order (yellow form or pink MOST form) -     Chief Complaint  Patient presents with  . Acute Visit    Cough and Congestion    HPI:  Pt is a 82 y.o. female seen today for an acute visit forComplaints of increased cough and congestion.  A chest x-ray was ordered which shows right basilar atelectasis versus pneumonia  She is afebrile-she is a poor historian secondary to dementia.  O2 stats are in the 90s on room air.  Currently she is sitting in her wheelchair comfortably does not complain of shortness of breath says that one point her throat felt scratchy but otherwise denies issues other than the cough.     Past Medical History:  Diagnosis Date  . Asthma   . Dementia   . Diabetes mellitus   . H. pylori infection MAR 2014  . Hypertension   . Stroke [facility])    Past Surgical History:  Procedure Laterality Date  . CHOLECYSTECTOMY    . COLECTOMY     secondary to diverticulitis  . ESOPHAGOGASTRODUODENOSCOPY (EGD)  WITH ESOPHAGEAL DILATION N/A 01/07/2013   EXB:MWUXLKGM ring was found at the gastroesophageal junction/SINGLE DUODENAL DIVERTICULUM    No Known Allergies  Current Outpatient Prescriptions on File Prior to Visit  Medication Sig Dispense Refill  . aspirin 81 MG tablet Take 81 mg by mouth daily.     . Calcium Carbonate-Vitamin D (CALCIUM 500/VITAMIN D PO) Take 500 mg by mouth 2 (two) times daily.    . citalopram (CELEXA) 20 MG tablet Take 20 mg by mouth daily.    Marland Kitchen lisinopril (PRINIVIL,ZESTRIL) 20 MG tablet Take 40 mg by mouth daily.     Marland Kitchen loratadine (CLARITIN) 10 MG tablet Take 10 mg by mouth daily.    . Magnesium Hydroxide (MILK OF MAGNESIA PO) Give 30 ml by mouth as needed for constipation may try fleets enema.    . memantine (NAMENDA TITRATION PAK) tablet pack Take 10 mg by mouth 2 (two) times daily.     . metoprolol (LOPRESSOR) 50 MG tablet Take 50 mg by mouth 2 (two) times daily.    . mirtazapine (REMERON) 15 MG tablet Take 15 mg by mouth at bedtime.    . Nutritional Supplements (ENSURE CLEAR PO) Give ensure clear ( apple ) daily due to decreased appetite once a day    . omeprazole (PRILOSEC) 40 MG capsule Take 40 mg by mouth 2 (two) times daily.    . ondansetron (ZOFRAN) 4 MG tablet Take 4 mg by mouth daily at 6 (six) AM. Before meals    .  ondansetron (ZOFRAN) 4 MG tablet Take 4 mg by mouth at bedtime.     No current facility-administered medications on file prior to visit.      Review of Systems   This is limited secondary to dementia please see history of present illness  Immunization History  Administered Date(s) Administered  . Influenza-Unspecified 07/20/2016  . Pneumococcal-Unspecified 07/24/2016   Pertinent  Health Maintenance Due  Topic Date Due  . DEXA SCAN  06/04/1999  . PNA vac Low Risk Adult (2 of 2 - PCV13) 07/24/2017  . INFLUENZA VACCINE  Completed   No flowsheet data found. Functional Status Survey:    Vitals:   09/21/16 1208  BP: 124/76  Pulse: 65    Resp: 18  Temp: 97.6 F (36.4 C)  TempSrc: Oral    Physical Exam    In general this is a pleasant elderly female in no distress sitting comfortably in her wheelchair.  Her skin is warm and dry.   Eyes-I could not appreciate any active drainage her sclera and conjunctiva are clear visual acuity appears grossly intact  Oropharynx is clear mucous membranes moist she does have some extractions mid lower mouth  Heart is regular rate and rhythm without murmur gallop or rub.  Chest --she does have bronchial sounds on the right side there is no labored breathing.  Abdomen is soft nontender with positive bowel sounds  Musculoskeletal moves all her extremities at baseline there are arthritic changes but I do not note any deformities    Neurologic is grossly intact no lateralizing findings her speech is clear.  Psych she is oriented to self Pleasant and appropriate responds easily to verbal commands  Labs reviewed:  Recent Labs  06/17/16 0730  06/26/16 0627  07/10/16 0640 08/25/16 1100 09/01/16 0700  NA 136  < > 137  < > 138 136 138  K 3.3*  < > 3.9  < > 3.5 3.3* 4.1  CL 98*  < > 101  < > 102 103 104  CO2 29  < > 30  < > 27 28 28   GLUCOSE 129*  < > 109*  < > 99 140* 99  BUN 8  < > 18  < > 12 12 14   CREATININE 0.55  < > 0.63  < > 0.65 0.60 0.74  CALCIUM 9.0  < > 8.5*  < > 8.8* 8.8* 8.8*  MG 1.6*  --  2.0  --   --   --  1.9  < > = values in this interval not displayed.  Recent Labs  06/14/16 0800  AST 20  ALT 13*  ALKPHOS 60  BILITOT 0.7  PROT 6.5  ALBUMIN 3.6    Recent Labs  06/14/16 0800 07/10/16 0640  WBC 8.8 8.4  NEUTROABS 6.0 5.7  HGB 13.1 12.8  HCT 38.7 38.6  MCV 95.1 97.7  PLT 241 338   No results found for: TSH No results found for: HGBA1C No results found for: CHOL, HDL, LDLCALC, LDLDIRECT, TRIG, CHOLHDL  Significant Diagnostic Results in last 30 days:  No results found.  Assessment/Plan Cough with congestion-again chest x-ray  is suspicious for possible developing pneumonia-also have somewhat congested sounds on the right side-will treat with Augmentin 500 mg twice a day for 7 days-did consider quinolone however there is interaction with the Zofran which she receives for her esophageal dysmotility.  We will give her Augmentin for 7 days-.  Also start Mucinex 60 mg twice a day for 7 days as well  as duo nebs when necessary every 6 hours for 7 days.  Monitor vital signs pulse ox every shift for 72 hours.  [id number]      London SheerLuster, Sally C, CMA [phone]

## 2016-09-26 ENCOUNTER — Non-Acute Institutional Stay (SKILLED_NURSING_FACILITY): Payer: Medicare Other | Admitting: Internal Medicine

## 2016-09-26 ENCOUNTER — Encounter: Payer: Self-pay | Admitting: Internal Medicine

## 2016-09-26 DIAGNOSIS — R112 Nausea with vomiting, unspecified: Secondary | ICD-10-CM | POA: Diagnosis not present

## 2016-09-26 DIAGNOSIS — I10 Essential (primary) hypertension: Secondary | ICD-10-CM | POA: Diagnosis not present

## 2016-09-26 DIAGNOSIS — F331 Major depressive disorder, recurrent, moderate: Secondary | ICD-10-CM | POA: Diagnosis not present

## 2016-09-26 DIAGNOSIS — J189 Pneumonia, unspecified organism: Secondary | ICD-10-CM

## 2016-09-26 DIAGNOSIS — K224 Dyskinesia of esophagus: Secondary | ICD-10-CM | POA: Diagnosis not present

## 2016-09-26 NOTE — Progress Notes (Signed)
Location:   [facility] Room Number: 115/W Place of Service:  SNF (31) Provider:  Conni SlipperAnjali,Izak Anding  SHAH,[name], MD  Patient Care Team: Kirstie PeriAshish Shah, MD as PCP - General (Internal Medicine) West BaliSandi L Fields, MD as Attending Physician (Gastroenterology)  Extended Emergency Contact Information Primary Emergency Contact: Wallis MartGarcia,James A Address: [address]          [place], [place] [address] [place] Home Phone: [phone] Mobile Phone: [phone] Relation: Spouse Secondary Emergency Contact: [name],[name]  [place] Home Phone: [phone] Relation: Son  Code Status:  Full Code Goals of care: Advanced Directive information Advanced Directives 09/26/2016  Does Patient Have a Medical Advance Directive? Yes  Type of Advance Directive (No Data)  Does patient want to make changes to medical advance directive? No - Patient declined  Copy of Healthcare Power of Attorney in Chart? -  Pre-existing out of facility DNR order (yellow form or pink MOST form) -     Chief Complaint  Patient presents with  . Medical Management of Chronic Issues    Routine Visit    HPI:  Pt is a 82 y.o. female seen today for medical management of chronic diseases.  Patient is long term resident of facility and to be seen today for Routine visit.  Patient has h/o Hypertension, recurrent vominting, dementia and recurrent falls . Patiet had Cough with questionable pneumonia on X ray. She was started on Augmentin. She has been stable since then. Cough is still there but patient has no fever ,chills, Or SOB. Patient has severe dementia and is not very reliable with her history. But her mood is much better she is smiling more and looks more awake. Patient was started on Celexa few months ago. Her weight is now up to 116 lbs. She is also on Remeron at night and Namenda. Her vomiting is resolved with Zofran before meals. Past Medical History:  Diagnosis Date  . Asthma    . Dementia   . Diabetes mellitus   . H. pylori infection MAR 2014  . Hypertension   . Stroke [facility](HCC)    Past Surgical History:  Procedure Laterality Date  . CHOLECYSTECTOMY    . COLECTOMY     secondary to diverticulitis  . ESOPHAGOGASTRODUODENOSCOPY (EGD) WITH ESOPHAGEAL DILATION N/A 01/07/2013   UUV:[id number]:Schatzki ring was found at the gastroesophageal junction/SINGLE DUODENAL DIVERTICULUM    No Known Allergies  Current Outpatient Prescriptions on File Prior to Visit  Medication Sig Dispense Refill  . Calcium Carbonate-Vitamin D (CALCIUM 500/VITAMIN D PO) Take 500 mg by mouth 2 (two) times daily.    . citalopram (CELEXA) 20 MG tablet Take 20 mg by mouth daily.    Marland Kitchen. lisinopril (PRINIVIL,ZESTRIL) 20 MG tablet Take 40 mg by mouth daily.     Marland Kitchen. loratadine (CLARITIN) 10 MG tablet Take 10 mg by mouth daily.    . Magnesium Hydroxide (MILK OF MAGNESIA PO) Give 30 ml by mouth as needed for constipation may try fleets enema.    . memantine (NAMENDA TITRATION PAK) tablet pack Take 10 mg by mouth 2 (two) times daily.     . metoprolol (LOPRESSOR) 50 MG tablet Take 50 mg by mouth 2 (two) times daily.    . mirtazapine (REMERON) 15 MG tablet Take 15 mg by mouth at bedtime.    . Nutritional Supplements (ENSURE CLEAR PO) Give ensure clear ( apple ) daily due to decreased appetite once a day    . omeprazole (PRILOSEC) 40 MG capsule  Take 40 mg by mouth 2 (two) times daily.    . ondansetron (ZOFRAN) 4 MG tablet Take 4 mg by mouth daily at 6 (six) AM. Before meals    . ondansetron (ZOFRAN) 4 MG tablet Take 4 mg by mouth at bedtime.    . potassium chloride SA (K-DUR,KLOR-CON) 20 MEQ tablet Take 20 mEq by mouth daily.     No current facility-administered medications on file prior to visit.      Review of Systems  Unable to perform ROS: Dementia    Immunization History  Administered Date(s) Administered  . Influenza-Unspecified 07/20/2016  . Pneumococcal-Unspecified 07/24/2016   Pertinent  Health  Maintenance Due  Topic Date Due  . DEXA SCAN  06/04/1999  . PNA vac Low Risk Adult (2 of 2 - PCV13) 07/24/2017  . INFLUENZA VACCINE  Completed   No flowsheet data found. Functional Status Survey:    Vitals:   09/26/16 1248  BP: (!) 145/76  Pulse: 61  Resp: 18  Temp: 97.4 F (36.3 C)  TempSrc: Oral   There is no height or weight on file to calculate BMI. Physical Exam  Constitutional: She appears well-developed and well-nourished.  HENT:  Head: Normocephalic.  Mouth/Throat: Oropharynx is clear and moist.  Eyes: Pupils are equal, round, and reactive to light.  Neck: Neck supple.  Cardiovascular: Normal rate, regular rhythm and normal heart sounds.   No murmur heard. Pulmonary/Chest: Effort normal and breath sounds normal. No respiratory distress. She has no wheezes.  Abdominal: Soft. Bowel sounds are normal. She exhibits no distension. There is no tenderness. There is no rebound.  Musculoskeletal: She exhibits no edema.  Lymphadenopathy:    She has no cervical adenopathy.  Neurological: She is alert.  Patient is not oriented to time place or person.  Psychiatric: Cognition and memory are impaired.  Her mood and Behavior was more Upbeat.    Labs reviewed:  Recent Labs  06/17/16 0730  06/26/16 0627  07/10/16 0640 08/25/16 1100 09/01/16 0700  NA 136  < > 137  < > 138 136 138  K 3.3*  < > 3.9  < > 3.5 3.3* 4.1  CL 98*  < > 101  < > 102 103 104  CO2 29  < > 30  < > 27 28 28   GLUCOSE 129*  < > 109*  < > 99 140* 99  BUN 8  < > 18  < > 12 12 14   CREATININE 0.55  < > 0.63  < > 0.65 0.60 0.74  CALCIUM 9.0  < > 8.5*  < > 8.8* 8.8* 8.8*  MG 1.6*  --  2.0  --   --   --  1.9  < > = values in this interval not displayed.  Recent Labs  06/14/16 0800  AST 20  ALT 13*  ALKPHOS 60  BILITOT 0.7  PROT 6.5  ALBUMIN 3.6    Recent Labs  06/14/16 0800 07/10/16 0640  WBC 8.8 8.4  NEUTROABS 6.0 5.7  HGB 13.1 12.8  HCT 38.7 38.6  MCV 95.1 97.7  PLT 241 338   No  results found for: TSH No results found for: HGBA1C No results found for: CHOL, HDL, LDLCALC, LDLDIRECT, TRIG, CHOLHDL  Significant Diagnostic Results in last 30 days:  No results found.  Assessment/Plan  HCAP (healthcare-associated pneumonia)  Patient on Augmentin . Doing well.  Nausea and vomiting due to esophageal motility disorder  Patient stable with Zofran and Prilosec   Essential hypertension  BP stable on Lisinopril and Metoprolol. No changes needed.  Reccurent falls with Fracture  On calcium and Vit D Will get Dexa scan to measure bone density.  Major depression Doing really well on Celexa Will increase the does to 40 mg.  Family/ staff Communication:   Labs/tests ordered:  Dexa scan

## 2016-09-27 ENCOUNTER — Encounter (HOSPITAL_COMMUNITY)
Admission: RE | Admit: 2016-09-27 | Discharge: 2016-09-27 | Disposition: A | Discharge status: Home / Self Care | Payer: Medicare Other | Source: Skilled Nursing Facility | Attending: Internal Medicine | Admitting: Internal Medicine

## 2016-09-27 DIAGNOSIS — J189 Pneumonia, unspecified organism: Secondary | ICD-10-CM | POA: Insufficient documentation

## 2016-09-27 DIAGNOSIS — F0390 Unspecified dementia without behavioral disturbance: Secondary | ICD-10-CM | POA: Insufficient documentation

## 2016-09-27 DIAGNOSIS — M6281 Muscle weakness (generalized): Secondary | ICD-10-CM | POA: Insufficient documentation

## 2016-09-27 LAB — CALCIUM: Calcium: 9.1 mg/dL (ref 8.9–10.3)

## 2016-09-28 LAB — PARATHYROID HORMONE, INTACT (NO CA): PTH: 16 pg/mL (ref 15–65)

## 2016-09-28 LAB — VITAMIN D 25 HYDROXY (VIT D DEFICIENCY, FRACTURES): VIT D 25 HYDROXY: 57.1 ng/mL (ref 30.0–100.0)

## 2016-10-10 ENCOUNTER — Other Ambulatory Visit (HOSPITAL_COMMUNITY)
Admission: RE | Admit: 2016-10-10 | Discharge: 2016-10-10 | Disposition: A | Discharge status: Home / Self Care | Payer: Medicare Other | Source: Skilled Nursing Facility | Attending: Internal Medicine | Admitting: Internal Medicine

## 2016-10-10 ENCOUNTER — Non-Acute Institutional Stay (SKILLED_NURSING_FACILITY): Payer: Medicare Other | Admitting: Internal Medicine

## 2016-10-10 DIAGNOSIS — M6281 Muscle weakness (generalized): Secondary | ICD-10-CM | POA: Diagnosis present

## 2016-10-10 DIAGNOSIS — I639 Cerebral infarction, unspecified: Secondary | ICD-10-CM | POA: Diagnosis present

## 2016-10-10 DIAGNOSIS — F0390 Unspecified dementia without behavioral disturbance: Secondary | ICD-10-CM | POA: Diagnosis present

## 2016-10-10 DIAGNOSIS — Z8701 Personal history of pneumonia (recurrent): Secondary | ICD-10-CM

## 2016-10-10 DIAGNOSIS — J189 Pneumonia, unspecified organism: Secondary | ICD-10-CM | POA: Insufficient documentation

## 2016-10-10 DIAGNOSIS — Z5189 Encounter for other specified aftercare: Secondary | ICD-10-CM | POA: Diagnosis present

## 2016-10-10 DIAGNOSIS — N390 Urinary tract infection, site not specified: Secondary | ICD-10-CM | POA: Insufficient documentation

## 2016-10-10 DIAGNOSIS — R1111 Vomiting without nausea: Secondary | ICD-10-CM | POA: Diagnosis not present

## 2016-10-10 DIAGNOSIS — R05 Cough: Secondary | ICD-10-CM | POA: Diagnosis not present

## 2016-10-10 DIAGNOSIS — R059 Cough, unspecified: Secondary | ICD-10-CM

## 2016-10-10 LAB — URINALYSIS, ROUTINE W REFLEX MICROSCOPIC
Bilirubin Urine: NEGATIVE
GLUCOSE, UA: 50 mg/dL — AB
Ketones, ur: NEGATIVE mg/dL
Leukocytes, UA: NEGATIVE
Nitrite: NEGATIVE
PROTEIN: NEGATIVE mg/dL
Specific Gravity, Urine: 1.018 (ref 1.005–1.030)
pH: 5 (ref 5.0–8.0)

## 2016-10-10 LAB — CBC WITH DIFFERENTIAL/PLATELET
Basophils Absolute: 0 10*3/uL (ref 0.0–0.1)
Basophils Relative: 1 %
Eosinophils Absolute: 0.1 10*3/uL (ref 0.0–0.7)
Eosinophils Relative: 1 %
HEMATOCRIT: 41.7 % (ref 36.0–46.0)
HEMOGLOBIN: 13.6 g/dL (ref 12.0–15.0)
LYMPHS ABS: 1.3 10*3/uL (ref 0.7–4.0)
Lymphocytes Relative: 16 %
MCH: 31 pg (ref 26.0–34.0)
MCHC: 32.6 g/dL (ref 30.0–36.0)
MCV: 95 fL (ref 78.0–100.0)
MONO ABS: 0.5 10*3/uL (ref 0.1–1.0)
MONOS PCT: 6 %
NEUTROS ABS: 6.4 10*3/uL (ref 1.7–7.7)
NEUTROS PCT: 76 %
Platelets: 254 10*3/uL (ref 150–400)
RBC: 4.39 MIL/uL (ref 3.87–5.11)
RDW: 13.1 % (ref 11.5–15.5)
WBC: 8.3 10*3/uL (ref 4.0–10.5)

## 2016-10-10 LAB — COMPREHENSIVE METABOLIC PANEL
ALBUMIN: 3.6 g/dL (ref 3.5–5.0)
ALK PHOS: 71 U/L (ref 38–126)
ALT: 18 U/L (ref 14–54)
AST: 25 U/L (ref 15–41)
Anion gap: 8 (ref 5–15)
BUN: 12 mg/dL (ref 6–20)
CALCIUM: 8.6 mg/dL — AB (ref 8.9–10.3)
CHLORIDE: 100 mmol/L — AB (ref 101–111)
CO2: 26 mmol/L (ref 22–32)
CREATININE: 0.78 mg/dL (ref 0.44–1.00)
GFR calc non Af Amer: >60 mL/min (ref >60)
GLUCOSE: 139 mg/dL — AB (ref 65–99)
Potassium: 3.8 mmol/L (ref 3.5–5.1)
SODIUM: 134 mmol/L — AB (ref 135–145)
Total Bilirubin: 0.5 mg/dL (ref 0.3–1.2)
Total Protein: 7 g/dL (ref 6.5–8.1)

## 2016-10-10 LAB — LIPASE, BLOOD: Lipase: 40 U/L (ref 11–51)

## 2016-10-10 LAB — AMYLASE: Amylase: 60 U/L (ref 28–100)

## 2016-10-11 ENCOUNTER — Encounter: Payer: Self-pay | Admitting: Internal Medicine

## 2016-10-11 NOTE — Progress Notes (Deleted)
Patient ID: Sabrina [name], female   DOB: Oct 12, 1932, 82 y.o.   MRN: [id number]  Location:   [facility] Room Number: 115 W Place of Service:  SNF (31) Provider:  Vanice SarahArlo Lassen  SHAH,[name], MD  Patient Care Team: Kirstie PeriAshish Shah, MD as PCP - General (Internal Medicine) West BaliSandi L Fields, MD as Attending Physician (Gastroenterology)  Extended Emergency Contact Information Primary Emergency Contact: Wallis MartGarcia,James A Address: [address]          [place], [place] [address] [place] Home Phone: [phone] Mobile Phone: [phone] Relation: Spouse Secondary Emergency Contact: [name],[name]  [place] Home Phone: [phone] Relation: Son  Code Status:  *** Goals of care: Advanced Directive information Advanced Directives 10/11/2016  Does Patient Have a Medical Advance Directive? Yes  Type of Advance Directive (No Data)  Does patient want to make changes to medical advance directive? No - Patient declined  Copy of Healthcare Power of Attorney in Chart? Yes  Pre-existing out of facility DNR order (yellow form or pink MOST form) -     Chief Complaint  Patient presents with  . Acute Visit    vomiting   . Cough    HPI:  Pt is a 82 y.o. female seen today for an acute visit for Follow-up of cough with a vomiting episode..  Her vital signs are stable she says she just does not feel well but she has no specific complaints  Apparently the vomiting episode was of short duration.  She continues to have somewhat of a cough and I feel we will need to order a chest x-ray     Past Medical History:  Diagnosis Date  . Asthma   . Dementia   . Diabetes mellitus   . H. pylori infection MAR 2014  . Hypertension   . Stroke [facility](HCC)    Past Surgical History:  Procedure Laterality Date  . CHOLECYSTECTOMY    . COLECTOMY     secondary to diverticulitis  . ESOPHAGOGASTRODUODENOSCOPY (EGD) WITH ESOPHAGEAL DILATION N/A 01/07/2013   OZD:[id number]:Schatzki ring was  found at the gastroesophageal junction/SINGLE DUODENAL DIVERTICULUM    No Known Allergies  Allergies as of 10/11/2016   No Known Allergies     Medication List    Notice   This visit is during an admission. Changes to the med list made in this visit will be reflected in the After Visit Summary of the admission.     Review of Systems  Immunization History  Administered Date(s) Administered  . Influenza-Unspecified 07/20/2016  . Pneumococcal-Unspecified 07/24/2016   Pertinent  Health Maintenance Due  Topic Date Due  . DEXA SCAN  06/04/1999  . PNA vac Low Risk Adult (2 of 2 - PCV13) 07/24/2017  . INFLUENZA VACCINE  Completed   No flowsheet data found. Functional Status Survey:    Vitals:   10/11/16 1649  BP: 108/72  Pulse: 63  Resp: 18  Temp: (!) 96.1 F (35.6 C)  TempSrc: Oral   There is no height or weight on file to calculate BMI. Physical Exam  Labs reviewed:  Recent Labs  06/17/16 0730  06/26/16 0627  08/25/16 1100 09/01/16 0700 09/27/16 0715 10/10/16 1455  NA 136  < > 137  < > 136 138  --  134*  K 3.3*  < > 3.9  < > 3.3* 4.1  --  3.8  CL 98*  < > 101  < > 103 104  --  100*  CO2 29  < >  30  < > 28 28  --  26  GLUCOSE 129*  < > 109*  < > 140* 99  --  139*  BUN 8  < > 18  < > 12 14  --  12  CREATININE 0.55  < > 0.63  < > 0.60 0.74  --  0.78  CALCIUM 9.0  < > 8.5*  < > 8.8* 8.8* 9.1 8.6*  MG 1.6*  --  2.0  --   --  1.9  --   --   < > = values in this interval not displayed.  Recent Labs  06/14/16 0800 10/10/16 1455  AST 20 25  ALT 13* 18  ALKPHOS 60 71  BILITOT 0.7 0.5  PROT 6.5 7.0  ALBUMIN 3.6 3.6    Recent Labs  06/14/16 0800 07/10/16 0640 10/10/16 1455  WBC 8.8 8.4 8.3  NEUTROABS 6.0 5.7 6.4  HGB 13.1 12.8 13.6  HCT 38.7 38.6 41.7  MCV 95.1 97.7 95.0  PLT 241 338 254   No results found for: TSH No results found for: HGBA1C No results found for: CHOL, HDL, LDLCALC, LDLDIRECT, TRIG, CHOLHDL  Significant Diagnostic Results  in last 30 days:  No results found.  Assessment/Plan There are no diagnoses linked to this encounter.      Luana ShuMeshell A Simpson, ArizonaRMA [phone]   This encounter was created in error - please disregard.  This encounter was created in error - please disregard.

## 2016-10-12 LAB — URINE CULTURE: CULTURE: NO GROWTH

## 2016-10-14 NOTE — Progress Notes (Signed)
This is an acute visit.  Level care skilled.  [facility]  Chief complaint acute visit secondary to vomiting and cough.  History of present illness.  Patient is a pleasant 82 year old female who has had some persistent vomiting which is fairly chronic-this has improved with her regular dosing of Zofran.  She is also coughing today-she does not complain of any abdominal pain or shortness of breath.  She recently finished a course of Augmentin for possible pneumonia.  Currently her vital signs are stable she is not in any distress but she appears that she is not feeling like herself. Past Medical History:  Diagnosis Date  . Asthma   . Dementia   . Diabetes mellitus   . H. pylori infection MAR 2014  . Hypertension   . Stroke [facility](HCC)         Past Surgical History:  Procedure Laterality Date  . CHOLECYSTECTOMY    . COLECTOMY     secondary to diverticulitis  . ESOPHAGOGASTRODUODENOSCOPY (EGD) WITH ESOPHAGEAL DILATION N/A 01/07/2013   YQM:[id number]:Schatzki ring was found at the gastroesophageal junction/SINGLE DUODENAL DIVERTICULUM    No Known Allergies  Allergies as of 10/11/2016   No Known Allergies     Medication List    Anusol when necessary.  Enteric-coated aspirin 81 mg a day.  Celexa 40 mg daily.  Claritin 10 mg daily.  Lisinopril 40 mg daily.  Metoprolol 50 mg twice a day.  Namenda 10 mg twice a day.  Prilosec 40 mg twice a day.  Potassium 20 mEq daily.  Remeron 15 mg daily.  Zofran 4 mg before meals and at at bedtime.  Review of systems-this is somewhat limited secondary to patient's dementia but she is not complaining of any chest pain or shortness of breath or abdominal discomfort-           Immunization History  Administered Date(s) Administered  . Influenza-Unspecified 07/20/2016  . Pneumococcal-Unspecified 07/24/2016       Pertinent  Health Maintenance Due  Topic Date Due  . DEXA SCAN  06/04/1999  .  PNA vac Low Risk Adult (2 of 2 - PCV13) 07/24/2017  . INFLUENZA VACCINE  Completed   No flowsheet data found. Functional Status Survey:       Vitals:   10/11/16 1649  BP: 108/72  Pulse: 63  Resp: 18  Temp: (!) 96.1 F (35.6 C)  TempSrc: Oral    Physical exam In general this is a pleasant elderly female in no distress sitting comfortably in her wheelchair. She just appears not be feeling very well  Her skin is warm and dry.  Eyes-I could not appreciate any active drainage her sclera and conjunctiva are clear visual acuity appears grossly intact  Oropharynx is clear mucous membranes moist she does have some extractions mid lower mouth  Heart is regular rate and rhythm without murmur gallop or rub.  Chest -- She has shallow air entry somewhat difficult to fully tell any congestion because she coughs with deep inspiration but did not hear any overt congestion  Abdomen is soft nontender with positive bowel sounds  Musculoskeletal moves all her extremities at baseline there are arthritic changes but I do not note any deformities    Neurologic is grossly intact no lateralizing findings her speech is clear.  Psych she is oriented to self Pleasant and appropriate responds easily to verbal commands  Labs  09/01/2016.  Sodium 138 potassium 4.1 BUN 14 creatinine 0.74.  Citracal fifth 2017.  WBC 8.4  hemoglobin 12.8 platelets 338.  Assessment and plan.  #1 vomiting-a bit later in the day this appeared to have improved she does not appear to be in any distress-but will check an abdominal x-ray.  Also noted a cough will check a two-view chest x-ray.  Also will update a CBC with differential CMP amylase and lipase stat.  For cough will add Mucinex 600 mg twice a day for 7 days as well as duo nebs every 6 hours when necessary for 7 days monitor closely vital signs pulse ox every shift for 72 hours.  Addendum.  We have received the results of the x-ray  which shows a patchy density right lower lobe-this appears to be the area where she had her previous pneumonia may have some residual effect here-but she is coughing here will give her Avelox 400 mg daily for 7 days.  We also have obtained updated labs  Which are reassuring with a white count normal at 8.3 hemoglobin 13.6--metabolic panel appears to be fairly baseline sodium is 134 potassium 3.8E 112 creatinine 0.78-liver function tests amylase and lipase are within normal limits  [id number]-of note greater than 40 minutes spent assessing patient-reassessing patient-reviewing her chart-reviewing her labs and updated labs -- reviewing updated chest x-ray and labs as well.  and coordinating a plan of care-of note greater than 50% of time spent today and plan a with assessment and reviews as noted above

## 2016-10-24 ENCOUNTER — Non-Acute Institutional Stay (SKILLED_NURSING_FACILITY): Payer: Medicare Other | Admitting: Internal Medicine

## 2016-10-24 ENCOUNTER — Encounter: Payer: Self-pay | Admitting: Internal Medicine

## 2016-10-24 DIAGNOSIS — F0390 Unspecified dementia without behavioral disturbance: Secondary | ICD-10-CM | POA: Diagnosis not present

## 2016-10-24 DIAGNOSIS — I10 Essential (primary) hypertension: Secondary | ICD-10-CM | POA: Diagnosis not present

## 2016-10-24 DIAGNOSIS — E876 Hypokalemia: Secondary | ICD-10-CM | POA: Diagnosis not present

## 2016-10-24 DIAGNOSIS — R112 Nausea with vomiting, unspecified: Secondary | ICD-10-CM

## 2016-10-24 DIAGNOSIS — M75101 Unspecified rotator cuff tear or rupture of right shoulder, not specified as traumatic: Secondary | ICD-10-CM | POA: Diagnosis not present

## 2016-10-24 NOTE — Progress Notes (Signed)
Location:   [facility] Room Number: 115/W Place of Service:  SNF (31) Provider:  Ezekiel Ina, MD  Patient Care Team: Monico Blitz, MD as PCP - General (Internal Medicine) Danie Binder, MD as Attending Physician (Gastroenterology)  Extended Emergency Contact Information Primary Emergency Contact: Royanne Foots Address: [address]          [place], [place] [address] [place] Phone: [phone] Mobile Phone: [phone] Relation: Spouse Secondary Emergency Contact: [place] Phone: [phone] Relation: Son  Code Status:  Full Code Goals of care: Advanced Directive information Advanced Directives 10/24/2016  Does Patient Have a Medical Advance Directive? Yes  Type of Advance Directive (No Data)  Does patient want to make changes to medical advance directive? No - Patient declined  Copy of [place] in Chart? -  Pre-existing out of facility DNR order (yellow form or pink MOST form) -     Chief Complaint  Patient presents with  . Medical Management of Chronic Issues    Routine Visit  For medical management of chronic medical conditions including dementia-failure to thrive-history of vomiting-allergic rhinitis-hypertension-  HPI:  Sabrina [name] is a 83 y.o. female seen today for medical management of chronic diseases. As noted above.  She was recently seen for episode of vomiting question aspiration-this resolved after about a day-she was put on Avelox for concerns about possible residual right lobe infiltrate-this appears to have stabilized she is not complaining of any shortness of breath or cough at this point-she does have a history of recurrent vomiting has seen GI she is currently on Zofran 4 times a day as well as Prilosec and this appears to be helping.  She actually has gained a small amount of weight which is encouraging.  She does continue on Remeron.  Versed to  dementia issues on Namenda she appears to be doing well with supportive care.  She does have a history of hypertension she is on lisinopril 40 mg day and Lopressor 50 mg twice a day I see listed readings often in the 456Y systolically however I got 124/76 manually today of according to nursing at times she does have low blood pressure readings in fact a see systolic of 563 recently as well at this point will monitor.  She was admitted here in August 2017 there were complaints of right shoulder pain but this appears to be controlled currently   Currently she is sitting in her wheelchair comfortably has no acute complaints she appears a bit stronger than when I seen her recently-nursing staff does not report any acute issues currently.     Past Medical History:  Diagnosis Date  . Asthma   . Dementia   . Diabetes mellitus   . H. pylori infection MAR 2014  . Hypertension   . Stroke [facility])    Past Surgical History:  Procedure Laterality Date  . CHOLECYSTECTOMY    . COLECTOMY     secondary to diverticulitis  . ESOPHAGOGASTRODUODENOSCOPY (EGD) WITH ESOPHAGEAL DILATION N/A 01/07/2013   SLH:[id number] ring was found at the gastroesophageal junction/SINGLE DUODENAL DIVERTICULUM    No Known Allergies  Current Outpatient Prescriptions on File Prior to Visit  Medication Sig Dispense Refill  . aspirin EC 81 MG tablet Take 81 mg by mouth daily.    . Calcium Carbonate-Vitamin D (CALCIUM 500/VITAMIN D PO) Take 500 mg by mouth 2 (two) times daily.    . citalopram (CELEXA) 40 MG tablet  Take 40 mg by mouth daily.     Marland Kitchen lisinopril (PRINIVIL,ZESTRIL) 20 MG tablet Take 40 mg by mouth daily.     Marland Kitchen loratadine (CLARITIN) 10 MG tablet Take 10 mg by mouth daily.    . Magnesium Hydroxide (MILK OF MAGNESIA PO) Give 30 ml by mouth as needed for constipation may try fleets enema.    . memantine (NAMENDA TITRATION PAK) tablet pack Take 10 mg by mouth 2 (two) times daily.     . metoprolol (LOPRESSOR) 50 MG tablet  Take 50 mg by mouth 2 (two) times daily.    . mirtazapine (REMERON) 15 MG tablet Take 15 mg by mouth at bedtime.    . Nutritional Supplements (ENSURE CLEAR PO) Give ensure clear ( apple ) daily due to decreased appetite once a day    . omeprazole (PRILOSEC) 40 MG capsule Take 40 mg by mouth 2 (two) times daily.    . ondansetron (ZOFRAN) 4 MG tablet Take 4 mg by mouth daily at 6 (six) AM. Before meals    . ondansetron (ZOFRAN) 4 MG tablet Take 4 mg by mouth at bedtime.    . potassium chloride SA (K-DUR,KLOR-CON) 20 MEQ tablet Take 20 mEq by mouth daily.     No current facility-administered medications on file prior to visit.     Review of Systems  This is limited secondary to dementia but she is not complaining of any shortness of breath increased cough apparently vomiting is occasionally sporadically but reduce certainly from when I saw HER-2 weeks ago-nursing staff does not report any recent acute issues otherwise  Immunization History  Administered Date(s) Administered  . Influenza-Unspecified 07/20/2016  . Pneumococcal-Unspecified 07/24/2016   Pertinent  Health Maintenance Due  Topic Date Due  . DEXA SCAN  06/04/1999  . PNA vac Low Risk Adult (2 of 2 - PCV13) 07/24/2017  . INFLUENZA VACCINE  Completed   No flowsheet data found. Functional Status Survey:    Vitals:   10/24/16 1328  BP: (!) 107/59  Pulse: 62  Resp: 18  Temp: 98.4 F (36.9 C)  TempSrc: Oral   Weight appears to be slowly increasing and not 119.4 pounds Physical Exam In general this is a pleasant elderly female in no distress sitting comfortably in her wheelchair. She appears a bit stronger than when I last saw her l  Her skin is warm and dry.  Eyes-I could not appreciate any active drainage her sclera and conjunctiva are clear visual acuity appears grossly intact  Oropharynx is clear mucous membranes moist she does have some extractions mid lower mouth  Heart is regular rate and rhythm without  murmur gallop or rub. She does not really have significant lower extremity edema  Chest -- There is no labored breathing-air entry somewhat shallow but I could not really appreciate any overt congestion  Abdomen is soft nontender with positive bowel sounds  Musculoskeletal moves all her extremities at baseline there are arthritic changes but I do not note any deformities    Neurologic is grossly intact no lateralizing findings her speech is clear.  Psych she is oriented to self Pleasant and appropriate responds easily to verbal commands  Labs reviewed:  Recent Labs  06/17/16 0730  06/26/16 0627  08/25/16 1100 09/01/16 0700 09/27/16 0715 10/10/16 1455  NA 136  < > 137  < > 136 138  --  134*  K 3.3*  < > 3.9  < > 3.3* 4.1  --  3.8  CL 98*  < >  101  < > 103 104  --  100*  CO2 29  < > 30  < > 28 28  --  26  GLUCOSE 129*  < > 109*  < > 140* 99  --  139*  BUN 8  < > 18  < > 12 14  --  12  CREATININE 0.55  < > 0.63  < > 0.60 0.74  --  0.78  CALCIUM 9.0  < > 8.5*  < > 8.8* 8.8* 9.1 8.6*  MG 1.6*  --  2.0  --   --  1.9  --   --   < > = values in this interval not displayed.  Recent Labs  06/14/16 0800 10/10/16 1455  AST 20 25  ALT 13* 18  ALKPHOS 60 71  BILITOT 0.7 0.5  PROT 6.5 7.0  ALBUMIN 3.6 3.6    Recent Labs  06/14/16 0800 07/10/16 0640 10/10/16 1455  WBC 8.8 8.4 8.3  NEUTROABS 6.0 5.7 6.4  HGB 13.1 12.8 13.6  HCT 38.7 38.6 41.7  MCV 95.1 97.7 95.0  PLT 241 338 254   No results found for: TSH No results found for: HGBA1C No results found for: CHOL, HDL, LDLCALC, LDLDIRECT, TRIG, CHOLHDL  Significant Diagnostic Results in last 30 days:  No results found.  Assessment/Plan T 1 history of persistent vomiting-this appears to have stabilized on the Zofran 4 times a day as well as Prilosec she has been followed by Marcelyn Ditty thought there may be some allergic rhinitis-sinus drainage associated with this and she is also on Claritin  #2 dementia this  appears to be stable she is actually gained a small amount of weight which is encouraging she is on Remeron for appetite stimulation she appears to be doing relatively well with supportive care.  #3 depression she is on Celexa this was recently increased she appears to be responding well to this.  #4 history of falls she is on calcium with vitamin D-she is not really complaining any more of her shoulder pain.  #5 history of pneumonia she appears to be asymptomatic at this point she's completed a course of Augmentin as well as subsequent course of Avelox in late December-cough has improved vomiting has improved as well.  #6 history of hypertension this appears stable as noted above there is some variability but no consistent elevations or depressions she is on lisinopril as well as Lopressor.  #7 history of hypokalemia she is on supplementation Will update a BMP to ensure stability.  Number 8 history of hemorrhoid pain she appears to be responding well to the Anusol.  [id number]

## 2016-10-25 NOTE — Progress Notes (Deleted)
This encounter was created in error - please disregard.  This encounter was created in error - please disregard.

## 2016-10-31 ENCOUNTER — Encounter (HOSPITAL_COMMUNITY)
Admission: RE | Admit: 2016-10-31 | Discharge: 2016-10-31 | Disposition: A | Discharge status: Home / Self Care | Payer: Medicare Other | Source: Skilled Nursing Facility | Attending: Internal Medicine | Admitting: Internal Medicine

## 2016-10-31 DIAGNOSIS — Z5189 Encounter for other specified aftercare: Secondary | ICD-10-CM | POA: Diagnosis present

## 2016-10-31 DIAGNOSIS — F0390 Unspecified dementia without behavioral disturbance: Secondary | ICD-10-CM | POA: Diagnosis not present

## 2016-10-31 DIAGNOSIS — J189 Pneumonia, unspecified organism: Secondary | ICD-10-CM | POA: Diagnosis present

## 2016-10-31 DIAGNOSIS — M6281 Muscle weakness (generalized): Secondary | ICD-10-CM | POA: Insufficient documentation

## 2016-10-31 DIAGNOSIS — E875 Hyperkalemia: Secondary | ICD-10-CM | POA: Diagnosis present

## 2016-10-31 LAB — BASIC METABOLIC PANEL
Anion gap: 9 (ref 5–15)
BUN: 9 mg/dL (ref 6–20)
CO2: 30 mmol/L (ref 22–32)
Calcium: 9.1 mg/dL (ref 8.9–10.3)
Chloride: 98 mmol/L — ABNORMAL LOW (ref 101–111)
Creatinine, Ser: 0.81 mg/dL (ref 0.44–1.00)
GFR calc Af Amer: >60 mL/min (ref >60)
Glucose, Bld: 89 mg/dL (ref 65–99)
POTASSIUM: 4.2 mmol/L (ref 3.5–5.1)
SODIUM: 137 mmol/L (ref 135–145)

## 2016-11-07 ENCOUNTER — Non-Acute Institutional Stay (SKILLED_NURSING_FACILITY): Payer: Medicare Other | Admitting: Internal Medicine

## 2016-11-07 ENCOUNTER — Encounter: Payer: Self-pay | Admitting: Internal Medicine

## 2016-11-07 DIAGNOSIS — I10 Essential (primary) hypertension: Secondary | ICD-10-CM | POA: Diagnosis not present

## 2016-11-07 DIAGNOSIS — R05 Cough: Secondary | ICD-10-CM | POA: Diagnosis not present

## 2016-11-07 DIAGNOSIS — R059 Cough, unspecified: Secondary | ICD-10-CM

## 2016-11-07 NOTE — Progress Notes (Signed)
Location:   [facility] Room Number: 115/W Place of Service:  SNF [phone]) Provider:  Vanice Sarah, MD  Patient Care Team: Kirstie Peri, MD as PCP - General (Internal Medicine) West Bali, MD as Attending Physician (Gastroenterology)  Extended Emergency Contact Information Primary Emergency Contact: Wallis Mart Address: [address]          [place], [place] [address] [place] Home Phone: [phone] Mobile Phone: [phone] Relation: Spouse Secondary Emergency Contact: [name] Home Phone: [phone] Relation: Son  Code Status:  Full Code Goals of care: Advanced Directive information Advanced Directives 11/07/2016  Does Patient Have a Medical Advance Directive? Yes  Type of Advance Directive (No Data)  Does patient want to make changes to medical advance directive? No - Patient declined  Copy of Healthcare Power of Attorney in Chart? -  Pre-existing out of facility DNR order (yellow form or pink MOST form) -     Chief Complaint  Patient presents with  . Acute Visit    Coughing    HPI:  Pt is a 83 y.o. female seen today for an acute visit for For a cough-apparently this is partially productive-her O2 saturations continued be in the 90s on room air she is not really complaining of shortness of breath just says that she feels congested in her nose and she is having some clear drainage as well.  She is on Claritin-with a history of allergic rhinitis.  She was seen several weeks ago for an episode of vomiting with questionable aspiration she was put on Avelox for concerns about possible residual right lobe infiltrate but this stabilized and her were no further issues.  She does have a history of recurrent vomiting has seen GI currently on Zofran 4 times a day as well as Prilosec.  She also has received Augmentin in the past for suspicions of pneumonia.  Marland Kitchen      Past Medical History:   Diagnosis Date  . Asthma   . Dementia   . Diabetes mellitus   . H. pylori infection MAR 2014  . Hypertension   . Stroke [facility])    Past Surgical History:  Procedure Laterality Date  . CHOLECYSTECTOMY    . COLECTOMY     secondary to diverticulitis  . ESOPHAGOGASTRODUODENOSCOPY (EGD) WITH ESOPHAGEAL DILATION N/A 01/07/2013   EXB:[id number] ring was found at the gastroesophageal junction/SINGLE DUODENAL DIVERTICULUM    No Known Allergies  Current Outpatient Prescriptions on File Prior to Visit  Medication Sig Dispense Refill  . aspirin EC 81 MG tablet Take 81 mg by mouth daily.    . Calcium Carbonate-Vitamin D (CALCIUM 500/VITAMIN D PO) Take 500 mg by mouth 2 (two) times daily.    . citalopram (CELEXA) 40 MG tablet Take 40 mg by mouth daily.     . hydrocortisone (ANUSOL-HC) 2.5 % rectal cream Place 1 application rectally as needed for hemorrhoids or itching.    Marland Kitchen lisinopril (PRINIVIL,ZESTRIL) 20 MG tablet Take 40 mg by mouth daily.     Marland Kitchen loratadine (CLARITIN) 10 MG tablet Take 10 mg by mouth daily.    . Magnesium Hydroxide (MILK OF MAGNESIA PO) Give 30 ml by mouth as needed for constipation may try fleets enema.    . memantine (NAMENDA TITRATION PAK) tablet pack Take 10 mg by mouth 2 (two) times daily.     . metoprolol (LOPRESSOR) 50 MG tablet Take 50 mg by mouth 2 (two) times  daily.    . mirtazapine (REMERON) 15 MG tablet Take 15 mg by mouth at bedtime.    . Nutritional Supplements (ENSURE CLEAR PO) Give ensure clear ( apple ) daily due to decreased appetite once a day    . omeprazole (PRILOSEC) 40 MG capsule Take 40 mg by mouth 2 (two) times daily.    . ondansetron (ZOFRAN) 4 MG tablet Take 4 mg by mouth daily at 6 (six) AM. Before meals    . ondansetron (ZOFRAN) 4 MG tablet Take 4 mg by mouth at bedtime.    . potassium chloride SA (K-DUR,KLOR-CON) 20 MEQ tablet Take 20 mEq by mouth daily.     No current facility-administered medications on file prior to visit.      Review of  Systems  This is limited secondary to dementia please see history of present illness again her main complaint appears to be some upper respiratory congestion nasal congestion is not really complaining of shortness of breath does have a cough  Immunization History  Administered Date(s) Administered  . Influenza-Unspecified 07/20/2016  . Pneumococcal-Unspecified 07/24/2016   Pertinent  Health Maintenance Due  Topic Date Due  . DEXA SCAN  06/04/1999  . PNA vac Low Risk Adult (2 of 2 - PCV13) 07/24/2017  . INFLUENZA VACCINE  Completed   No flowsheet data found. Functional Status Survey:    Vitals:   11/07/16 1115  BP: (!) 156/75  Pulse: 69  Resp: 20  Temp: 98.5 F (36.9 C)  TempSrc: Oral  SpO2: 96%   Of note manual blood pressure check was 122/62 Physical Exam   In general this is a pleasant elderly female in no distress sitting comfortably in her wheelchair. l  Her skin is warm and dry.  Eyes-I--appears to have a small amount of clear drainage sclera and conjunctiva are clear visual acuity appears grossly intact  Nose appears to have possibly a small amount of clear drainage  Oropharynx is clear mucous membranes moist she does have some extractions mid lower mouth  Heart is regular rate and rhythm without murmur gallop or rub. She does not really have significant lower extremity edema  Chest -- There is no labored breathing-air entry somewhat shallow but I could not really appreciate any overt congestion  Abdomen is soft nontender with positive bowel sounds  Musculoskeletal moves all her extremities at baseline there are arthritic changes but I do not note any deformities    Neurologic is grossly intact no lateralizing findings her speech is clear.  Psych she is oriented to self Pleasant and appropriate responds easily to verbal commands  Labs reviewed:  Recent Labs  06/17/16 0730  06/26/16 0627  09/01/16 0700 09/27/16 0715 10/10/16 1455  10/31/16 0720  NA 136  < > 137  < > 138  --  134* 137  K 3.3*  < > 3.9  < > 4.1  --  3.8 4.2  CL 98*  < > 101  < > 104  --  100* 98*  CO2 29  < > 30  < > 28  --  26 30  GLUCOSE 129*  < > 109*  < > 99  --  139* 89  BUN 8  < > 18  < > 14  --  12 9  CREATININE 0.55  < > 0.63  < > 0.74  --  0.78 0.81  CALCIUM 9.0  < > 8.5*  < > 8.8* 9.1 8.6* 9.1  MG 1.6*  --  2.0  --  1.9  --   --   --   < > = values in this interval not displayed.  Recent Labs  06/14/16 0800 10/10/16 1455  AST 20 25  ALT 13* 18  ALKPHOS 60 71  BILITOT 0.7 0.5  PROT 6.5 7.0  ALBUMIN 3.6 3.6    Recent Labs  06/14/16 0800 07/10/16 0640 10/10/16 1455  WBC 8.8 8.4 8.3  NEUTROABS 6.0 5.7 6.4  HGB 13.1 12.8 13.6  HCT 38.7 38.6 41.7  MCV 95.1 97.7 95.0  PLT 241 338 254   No results found for: TSH No results found for: HGBA1C No results found for: CHOL, HDL, LDLCALC, LDLDIRECT, TRIG, CHOLHDL  Significant Diagnostic Results in last 30 days:  No results found.  Assessment/Plan . Cough with nasal congestion-will start Mucinex 600 mg twice a day for 7 days-also will add Flonase 1 spray each nostril twice a day for 5 days-she continues on Claritin as well there may be an allergic rhinitis component to this.  With her history of pneumonia also will check a chest x-ray.  Also will add duo nebs every 6 hours when necessary for 5 days as needed for shortness of breath or wheezing.  Monitor vital signs pulse ox every shift for 72 hours.  #2 hypertension manual reading was reassuring at times she does have some elevated systolics but these do not appear to be consistent per chart review recent systolics range from 120s up to 150s at this point will monitor she is on Lopressor 50 mg twice a day as well as lisinopril 40 mg a day.  [id number]     London Sheer, CMA [phone]

## 2016-11-14 NOTE — Progress Notes (Signed)
This encounter was created in error - please disregard.

## 2016-11-14 NOTE — Progress Notes (Deleted)
This encounter was created in error - please disregard.

## 2016-11-14 NOTE — Progress Notes (Deleted)
This encounter was created in error - please disregard.  This encounter was created in error - please disregard.

## 2016-11-21 ENCOUNTER — Encounter: Payer: Self-pay | Admitting: Internal Medicine

## 2016-11-21 ENCOUNTER — Non-Acute Institutional Stay (SKILLED_NURSING_FACILITY): Payer: Medicare Other | Admitting: Internal Medicine

## 2016-11-21 DIAGNOSIS — F331 Major depressive disorder, recurrent, moderate: Secondary | ICD-10-CM | POA: Diagnosis not present

## 2016-11-21 DIAGNOSIS — I10 Essential (primary) hypertension: Secondary | ICD-10-CM

## 2016-11-21 DIAGNOSIS — F0390 Unspecified dementia without behavioral disturbance: Secondary | ICD-10-CM

## 2016-11-21 DIAGNOSIS — K224 Dyskinesia of esophagus: Secondary | ICD-10-CM

## 2016-11-21 DIAGNOSIS — M810 Age-related osteoporosis without current pathological fracture: Secondary | ICD-10-CM | POA: Diagnosis not present

## 2016-11-21 NOTE — Progress Notes (Signed)
Location:   [facility] Room Number: 115/W Place of Service:  SNF (31) Provider:  Conni Slipper, MD  Patient Care Team: Kirstie Peri, MD as PCP - General (Internal Medicine) [place] Bali, MD as Attending Physician (Gastroenterology)  Extended Emergency Contact Information Primary Emergency Contact: Wallis Mart Address: [address]          [place], [place] [address] [place] Home Phone: [phone] Mobile Phone: [phone] Relation: Spouse Secondary Emergency Contact: [name] Home Phone: [phone] Relation: Son  Code Status:  Full Code Goals of care: Advanced Directive information Advanced Directives 11/21/2016  Does Patient Have a Medical Advance Directive? Yes  Type of Advance Directive (No Data)  Does patient want to make changes to medical advance directive? No - Patient declined  Copy of Healthcare Power of Attorney in Chart? -  Pre-existing out of facility DNR order (yellow form or pink MOST form) -     Chief Complaint  Patient presents with  . Medical Management of Chronic Issues    Routine Visit    HPI:  Pt is a 83 y.o. female seen today for medical management of chronic diseases.    Patient has H/O  Hypertension, recurrent Vomiting, Falls, GERD and Dementia. Patient is Long term resident of Nursing facility and has been stable and doing well. She denied any complains. Was actually cheerful and more responsive. Her weight has been stable at around 116 lbs.  Her Vomiting is resolved with PRN Zofran.  Past Medical History:  Diagnosis Date  . Asthma   . Dementia   . Diabetes mellitus   . H. pylori infection MAR 2014  . Hypertension   . Stroke [facility])    Past Surgical History:  Procedure Laterality Date  . CHOLECYSTECTOMY    . COLECTOMY     secondary to diverticulitis  . ESOPHAGOGASTRODUODENOSCOPY (EGD) WITH ESOPHAGEAL DILATION N/A 01/07/2013   LKG:[id number] ring was found  at the gastroesophageal junction/SINGLE DUODENAL DIVERTICULUM    No Known Allergies  Current Outpatient Prescriptions on File Prior to Visit  Medication Sig Dispense Refill  . aspirin EC 81 MG tablet Take 81 mg by mouth daily.    . Calcium Carbonate-Vitamin D (CALCIUM 500/VITAMIN D PO) Take 500 mg by mouth 2 (two) times daily.    . citalopram (CELEXA) 40 MG tablet Take 40 mg by mouth daily.     . hydrocortisone (ANUSOL-HC) 2.5 % rectal cream Place 1 application rectally as needed for hemorrhoids or itching.    Marland Kitchen lisinopril (PRINIVIL,ZESTRIL) 20 MG tablet Take 40 mg by mouth daily.     Marland Kitchen loratadine (CLARITIN) 10 MG tablet Take 10 mg by mouth daily.    . Magnesium Hydroxide (MILK OF MAGNESIA PO) Give 30 ml by mouth as needed for constipation may try fleets enema.    . memantine (NAMENDA TITRATION PAK) tablet pack Take 10 mg by mouth 2 (two) times daily.     . metoprolol (LOPRESSOR) 50 MG tablet Take 50 mg by mouth 2 (two) times daily.    . mirtazapine (REMERON) 15 MG tablet Take 15 mg by mouth at bedtime.    . Nutritional Supplements (ENSURE CLEAR PO) Give ensure clear ( apple ) daily due to decreased appetite once a day    . omeprazole (PRILOSEC) 40 MG capsule Take 40 mg by mouth 2 (two) times daily.    . ondansetron (ZOFRAN) 4 MG tablet Take 4 mg by mouth daily at 6 (  six) AM. Before meals    . ondansetron (ZOFRAN) 4 MG tablet Take 4 mg by mouth at bedtime.    . potassium chloride SA (K-DUR,KLOR-CON) 20 MEQ tablet Take 20 mEq by mouth daily.     No current facility-administered medications on file prior to visit.      Review of Systems  Unable to perform ROS: Dementia (Patient denied any symptoms but is not reliable due to her dementia.)    Immunization History  Administered Date(s) Administered  . Influenza-Unspecified 07/20/2016  . Pneumococcal-Unspecified 07/24/2016   Pertinent  Health Maintenance Due  Topic Date Due  . DEXA SCAN  06/04/1999  . PNA vac Low Risk Adult (2 of 2  - PCV13) 07/24/2017  . INFLUENZA VACCINE  Completed   No flowsheet data found. Functional Status Survey:    There were no vitals filed for this visit. There is no height or weight on file to calculate BMI. Physical Exam  Constitutional: She appears well-developed and well-nourished.  HENT:  Head: Normocephalic.  Mouth/Throat: Oropharynx is clear and moist.  Eyes: Pupils are equal, round, and reactive to light.  Neck: Neck supple.  Cardiovascular: Normal rate, regular rhythm and normal heart sounds.   No murmur heard. Pulmonary/Chest: Effort normal and breath sounds normal. No respiratory distress. She has no wheezes. She has no rales.  Abdominal: Soft. Bowel sounds are normal. She exhibits no distension. There is no tenderness. There is no rebound.  Musculoskeletal: She exhibits no edema.  Lymphadenopathy:    She has no cervical adenopathy.  Neurological: She is alert.  Not oriented, Moves her extremities well but stays in wheel chair most of the time.  Skin: Skin is warm and dry. No rash noted. No erythema.  Psychiatric: She has a normal mood and affect. Her behavior is normal. Thought content normal.    Labs reviewed:  Recent Labs  06/17/16 0730  06/26/16 0627  09/01/16 0700 09/27/16 0715 10/10/16 1455 10/31/16 0720  NA 136  < > 137  < > 138  --  134* 137  K 3.3*  < > 3.9  < > 4.1  --  3.8 4.2  CL 98*  < > 101  < > 104  --  100* 98*  CO2 29  < > 30  < > 28  --  26 30  GLUCOSE 129*  < > 109*  < > 99  --  139* 89  BUN 8  < > 18  < > 14  --  12 9  CREATININE 0.55  < > 0.63  < > 0.74  --  0.78 0.81  CALCIUM 9.0  < > 8.5*  < > 8.8* 9.1 8.6* 9.1  MG 1.6*  --  2.0  --  1.9  --   --   --   < > = values in this interval not displayed.  Recent Labs  06/14/16 0800 10/10/16 1455  AST 20 25  ALT 13* 18  ALKPHOS 60 71  BILITOT 0.7 0.5  PROT 6.5 7.0  ALBUMIN 3.6 3.6    Recent Labs  06/14/16 0800 07/10/16 0640 10/10/16 1455  WBC 8.8 8.4 8.3  NEUTROABS 6.0 5.7 6.4   HGB 13.1 12.8 13.6  HCT 38.7 38.6 41.7  MCV 95.1 97.7 95.0  PLT 241 338 254   No results found for: TSH No results found for: HGBA1C No results found for: CHOL, HDL, LDLCALC, LDLDIRECT, TRIG, CHOLHDL  Significant Diagnostic Results in last 30 days:  No results  found.  Assessment/Plan  Osteoporosis Patient Had DEXA scan done and it showed T Score of -3.67. Will rule out secondary causes of osteoporosis. Her Calcium and Vit d level is normal. Will check TSH and Serum Phosphorus Will also start her on Prolia q 6 months as with her h/o nausea vomiting and esophageal dysmotility she would not be good candidate for biphosphonates Continue Calcium and Vit d supplement.  Hypertension BP stable on Lisinopril and Metoprolol Hypokaliemia Dis continue Potassium. As patient not having and Nausea or vomiting. Follow labs GERD  Stable with Prilosec and  Zofran before each meal Depression Pharmacy recommend to decrease the dose of Celexa due to her age Will decrease to 20 mg. Continue Remeron Patient doing very well with both these meds.  Dementia Stable on Namenda. Family/ staff Communication:   Labs/tests ordered:  CMP, Serum Phosphorus, CBC, TSH  Total time spent in this patient care encounter was 45_ minutes; greater than 50% of the visit spent counseling patient and coordinating care for problems addressed at this encounter.

## 2016-11-22 ENCOUNTER — Encounter (HOSPITAL_COMMUNITY)
Admission: RE | Admit: 2016-11-22 | Discharge: 2016-11-22 | Disposition: A | Discharge status: Home / Self Care | Payer: Medicare Other | Source: Skilled Nursing Facility | Attending: Internal Medicine | Admitting: Internal Medicine

## 2016-11-22 DIAGNOSIS — I639 Cerebral infarction, unspecified: Secondary | ICD-10-CM | POA: Diagnosis present

## 2016-11-22 DIAGNOSIS — F0390 Unspecified dementia without behavioral disturbance: Secondary | ICD-10-CM | POA: Diagnosis present

## 2016-11-22 DIAGNOSIS — E875 Hyperkalemia: Secondary | ICD-10-CM | POA: Insufficient documentation

## 2016-11-22 DIAGNOSIS — M6281 Muscle weakness (generalized): Secondary | ICD-10-CM | POA: Diagnosis present

## 2016-11-22 DIAGNOSIS — Z5189 Encounter for other specified aftercare: Secondary | ICD-10-CM | POA: Diagnosis present

## 2016-11-22 LAB — CBC
HEMATOCRIT: 38 % (ref 36.0–46.0)
HEMOGLOBIN: 12.5 g/dL (ref 12.0–15.0)
MCH: 31.1 pg (ref 26.0–34.0)
MCHC: 32.9 g/dL (ref 30.0–36.0)
MCV: 94.5 fL (ref 78.0–100.0)
Platelets: 238 10*3/uL (ref 150–400)
RBC: 4.02 MIL/uL (ref 3.87–5.11)
RDW: 13.6 % (ref 11.5–15.5)
WBC: 6 10*3/uL (ref 4.0–10.5)

## 2016-11-22 LAB — PHOSPHORUS: Phosphorus: 3.5 mg/dL (ref 2.5–4.6)

## 2016-11-22 LAB — COMPREHENSIVE METABOLIC PANEL
ALBUMIN: 3.1 g/dL — AB (ref 3.5–5.0)
ALK PHOS: 58 U/L (ref 38–126)
ALT: 13 U/L — AB (ref 14–54)
ANION GAP: 8 (ref 5–15)
AST: 25 U/L (ref 15–41)
BUN: 11 mg/dL (ref 6–20)
CALCIUM: 8.9 mg/dL (ref 8.9–10.3)
CHLORIDE: 102 mmol/L (ref 101–111)
CO2: 30 mmol/L (ref 22–32)
Creatinine, Ser: 0.81 mg/dL (ref 0.44–1.00)
GFR calc non Af Amer: >60 mL/min (ref >60)
GLUCOSE: 93 mg/dL (ref 65–99)
Potassium: 4.5 mmol/L (ref 3.5–5.1)
SODIUM: 140 mmol/L (ref 135–145)
Total Bilirubin: 0.6 mg/dL (ref 0.3–1.2)
Total Protein: 6.2 g/dL — ABNORMAL LOW (ref 6.5–8.1)

## 2016-11-22 LAB — TSH: TSH: 1.299 u[IU]/mL (ref 0.350–4.500)

## 2016-12-14 ENCOUNTER — Encounter (HOSPITAL_COMMUNITY)
Admission: RE | Admit: 2016-12-14 | Discharge: 2016-12-14 | Disposition: A | Discharge status: Home / Self Care | Payer: No Typology Code available for payment source | Source: Skilled Nursing Facility | Attending: Internal Medicine | Admitting: Internal Medicine

## 2016-12-14 DIAGNOSIS — I10 Essential (primary) hypertension: Secondary | ICD-10-CM | POA: Insufficient documentation

## 2016-12-14 DIAGNOSIS — E119 Type 2 diabetes mellitus without complications: Secondary | ICD-10-CM | POA: Diagnosis not present

## 2016-12-14 LAB — BASIC METABOLIC PANEL
ANION GAP: 7 (ref 5–15)
BUN: 6 mg/dL (ref 6–20)
CALCIUM: 8.7 mg/dL — AB (ref 8.9–10.3)
CO2: 28 mmol/L (ref 22–32)
Chloride: 105 mmol/L (ref 101–111)
Creatinine, Ser: 0.69 mg/dL (ref 0.44–1.00)
Glucose, Bld: 87 mg/dL (ref 65–99)
Potassium: 3.8 mmol/L (ref 3.5–5.1)
SODIUM: 140 mmol/L (ref 135–145)

## 2016-12-14 LAB — MAGNESIUM: MAGNESIUM: 1.7 mg/dL (ref 1.7–2.4)

## 2016-12-14 LAB — PHOSPHORUS: PHOSPHORUS: 3.2 mg/dL (ref 2.5–4.6)

## 2016-12-25 ENCOUNTER — Encounter (HOSPITAL_COMMUNITY): Payer: Self-pay | Admitting: *Deleted

## 2016-12-25 ENCOUNTER — Emergency Department (HOSPITAL_COMMUNITY): Payer: Medicare Other

## 2016-12-25 ENCOUNTER — Inpatient Hospital Stay (HOSPITAL_COMMUNITY)
Admission: EM | Admit: 2016-12-25 | Discharge: 2016-12-29 | DRG: 871 | Disposition: A | Discharge status: Skilled Nursing Facility | Payer: Medicare Other | Attending: Family Medicine | Admitting: Family Medicine

## 2016-12-25 DIAGNOSIS — F0390 Unspecified dementia without behavioral disturbance: Secondary | ICD-10-CM | POA: Diagnosis present

## 2016-12-25 DIAGNOSIS — J189 Pneumonia, unspecified organism: Secondary | ICD-10-CM | POA: Diagnosis present

## 2016-12-25 DIAGNOSIS — A419 Sepsis, unspecified organism: Secondary | ICD-10-CM | POA: Diagnosis not present

## 2016-12-25 DIAGNOSIS — Y95 Nosocomial condition: Secondary | ICD-10-CM | POA: Diagnosis present

## 2016-12-25 DIAGNOSIS — Z8619 Personal history of other infectious and parasitic diseases: Secondary | ICD-10-CM

## 2016-12-25 DIAGNOSIS — R6521 Severe sepsis with septic shock: Secondary | ICD-10-CM | POA: Diagnosis present

## 2016-12-25 DIAGNOSIS — Z7982 Long term (current) use of aspirin: Secondary | ICD-10-CM

## 2016-12-25 DIAGNOSIS — J9601 Acute respiratory failure with hypoxia: Secondary | ICD-10-CM

## 2016-12-25 DIAGNOSIS — Z79899 Other long term (current) drug therapy: Secondary | ICD-10-CM

## 2016-12-25 DIAGNOSIS — J45909 Unspecified asthma, uncomplicated: Secondary | ICD-10-CM | POA: Diagnosis present

## 2016-12-25 DIAGNOSIS — I959 Hypotension, unspecified: Secondary | ICD-10-CM | POA: Diagnosis present

## 2016-12-25 DIAGNOSIS — E119 Type 2 diabetes mellitus without complications: Secondary | ICD-10-CM | POA: Diagnosis present

## 2016-12-25 DIAGNOSIS — I10 Essential (primary) hypertension: Secondary | ICD-10-CM | POA: Diagnosis present

## 2016-12-25 DIAGNOSIS — Z8673 Personal history of transient ischemic attack (TIA), and cerebral infarction without residual deficits: Secondary | ICD-10-CM

## 2016-12-25 DIAGNOSIS — E872 Acidosis: Secondary | ICD-10-CM | POA: Diagnosis present

## 2016-12-25 LAB — CBC WITH DIFFERENTIAL/PLATELET
Basophils Absolute: 0 10*3/uL (ref 0.0–0.1)
Basophils Relative: 0 %
Eosinophils Absolute: 0 10*3/uL (ref 0.0–0.7)
Eosinophils Relative: 0 %
HCT: 37.4 % (ref 36.0–46.0)
Hemoglobin: 12.7 g/dL (ref 12.0–15.0)
Lymphocytes Relative: 9 %
Lymphs Abs: 0.9 10*3/uL (ref 0.7–4.0)
MCH: 31.8 pg (ref 26.0–34.0)
MCHC: 34 g/dL (ref 30.0–36.0)
MCV: 93.5 fL (ref 78.0–100.0)
Monocytes Absolute: 0.4 10*3/uL (ref 0.1–1.0)
Monocytes Relative: 4 %
Neutro Abs: 8.1 10*3/uL — ABNORMAL HIGH (ref 1.7–7.7)
Neutrophils Relative %: 87 %
Platelets: 209 10*3/uL (ref 150–400)
RBC: 4 MIL/uL (ref 3.87–5.11)
RDW: 13.9 % (ref 11.5–15.5)
WBC: 9.4 10*3/uL (ref 4.0–10.5)

## 2016-12-25 LAB — URINALYSIS, ROUTINE W REFLEX MICROSCOPIC
Bilirubin Urine: NEGATIVE
Glucose, UA: NEGATIVE mg/dL
Ketones, ur: NEGATIVE mg/dL
Nitrite: NEGATIVE
Protein, ur: NEGATIVE mg/dL
Specific Gravity, Urine: 1.013 (ref 1.005–1.030)
pH: 5 (ref 5.0–8.0)

## 2016-12-25 LAB — BASIC METABOLIC PANEL
Anion gap: 8 (ref 5–15)
BUN: 11 mg/dL (ref 6–20)
CO2: 27 mmol/L (ref 22–32)
Calcium: 8.8 mg/dL — ABNORMAL LOW (ref 8.9–10.3)
Chloride: 98 mmol/L — ABNORMAL LOW (ref 101–111)
Creatinine, Ser: 0.79 mg/dL (ref 0.44–1.00)
GFR calc Af Amer: >60 mL/min (ref >60)
GFR calc non Af Amer: >60 mL/min (ref >60)
Glucose, Bld: 153 mg/dL — ABNORMAL HIGH (ref 65–99)
Potassium: 3.7 mmol/L (ref 3.5–5.1)
Sodium: 133 mmol/L — ABNORMAL LOW (ref 135–145)

## 2016-12-25 MED ORDER — SODIUM CHLORIDE 0.9 % IV BOLUS (SEPSIS)
1000.0000 mL | Freq: Once | INTRAVENOUS | Status: AC
  Administered 2016-12-26: 1000 mL via INTRAVENOUS

## 2016-12-25 MED ORDER — ALBUTEROL SULFATE (2.5 MG/3ML) 0.083% IN NEBU
2.5000 mg | INHALATION_SOLUTION | Freq: Once | RESPIRATORY_TRACT | Status: AC
  Administered 2016-12-25: 2.5 mg via RESPIRATORY_TRACT
  Filled 2016-12-25: qty 3

## 2016-12-25 MED ORDER — ACETAMINOPHEN 650 MG RE SUPP
RECTAL | Status: AC
  Filled 2016-12-25: qty 1

## 2016-12-25 MED ORDER — VANCOMYCIN HCL IN DEXTROSE 1-5 GM/200ML-% IV SOLN
1000.0000 mg | Freq: Once | INTRAVENOUS | Status: AC
  Administered 2016-12-25: 1000 mg via INTRAVENOUS
  Filled 2016-12-25: qty 200

## 2016-12-25 MED ORDER — PIPERACILLIN-TAZOBACTAM 3.375 G IVPB 30 MIN
3.3750 g | Freq: Once | INTRAVENOUS | Status: AC
  Administered 2016-12-25: 3.375 g via INTRAVENOUS
  Filled 2016-12-25: qty 50

## 2016-12-25 MED ORDER — SODIUM CHLORIDE 0.9 % IV BOLUS (SEPSIS)
1000.0000 mL | Freq: Once | INTRAVENOUS | Status: AC
  Administered 2016-12-25: 1000 mL via INTRAVENOUS

## 2016-12-25 MED ORDER — ACETAMINOPHEN 650 MG RE SUPP
650.0000 mg | Freq: Once | RECTAL | Status: AC
  Administered 2016-12-25: 650 mg via RECTAL

## 2016-12-25 NOTE — ED Triage Notes (Signed)
Pt brought in by [facility] for c/o cough and sob that started a couple of hours ago; [facility], when [facility] arrived they placed patient on 4L of O2 and sats came up to 90's;

## 2016-12-25 NOTE — ED Provider Notes (Signed)
[facility]-EMERGENCY DEPT Provider Note   CSN: [id number] Arrival date & time: 12/25/16  2211   By signing my name below, I, Nelwyn Salisbury, attest that this documentation has been prepared under the direction and in the presence of Raeford Razor, MD . Electronically Signed: Nelwyn Salisbury, Scribe. 12/25/2016. 10:28 PM.  History   Chief Complaint Chief Complaint  Patient presents with  . Shortness of Breath   The history is provided by the patient and the EMS personnel. No language interpreter was used.    LEVEL 5 CAVEAT DUE TO ALTERED MENTAL STATUS  HPI Comments:  MILYN STAPLETON is a 83 y.o. female brought in by ambulance, who presents to the Emergency Department complaining of frequent, unchanged, productive cough which began a few hours ago. Per EMS, pt's oxygen saturation on site was in the 70's. Pt was placed on 4L of oxygen which brought her O2 to the 90's.She reports associated fatigue. Pt denies experiencing any pain currently.    Past Medical History:  Diagnosis Date  . Asthma   . Dementia   . Diabetes mellitus   . H. pylori infection MAR 2014  . Hypertension   . Stroke [facility])     Patient Active Problem List   Diagnosis Date Noted  . Osteoporosis 11/21/2016  . Hypertension 09/26/2016  . Esophageal dysmotility 07/25/2016  . Depression 07/25/2016  . Dementia 07/06/2016  . Diarrhea 07/16/2013  . Internal hemorrhoids with other complication 07/16/2013  . Rotator cuff syndrome of right shoulder 06/10/2013  . Fracture, humerus, proximal 04/08/2013  . Postural imbalance 02/19/2013  . Nausea with vomiting 01/06/2013  . Femur fracture (HCC) 04/07/2012    Past Surgical History:  Procedure Laterality Date  . CHOLECYSTECTOMY    . COLECTOMY     secondary to diverticulitis  . ESOPHAGOGASTRODUODENOSCOPY (EGD) WITH ESOPHAGEAL DILATION N/A 01/07/2013   YQM:VHQIONGE ring was found at the gastroesophageal junction/SINGLE DUODENAL DIVERTICULUM    OB History    No data available         Home Medications    Prior to Admission medications   Medication Sig Start Date End Date Taking? Authorizing Provider  aspirin EC 81 MG tablet Take 81 mg by mouth daily.    Historical Provider, MD  Calcium Carbonate-Vitamin D (CALCIUM 500/VITAMIN D PO) Take 500 mg by mouth 2 (two) times daily.    Historical Provider, MD  citalopram (CELEXA) 40 MG tablet Take 40 mg by mouth daily.     Historical Provider, MD  hydrocortisone (ANUSOL-HC) 2.5 % rectal cream Place 1 application rectally as needed for hemorrhoids or itching.    Historical Provider, MD  ipratropium-albuterol (DUONEB) 0.5-2.5 (3) MG/3ML SOLN Take 3 mLs by nebulization every 6 (six) hours as needed.    Historical Provider, MD  lisinopril (PRINIVIL,ZESTRIL) 20 MG tablet Take 40 mg by mouth daily.     Historical Provider, MD  loratadine (CLARITIN) 10 MG tablet Take 10 mg by mouth daily.    Historical Provider, MD  Magnesium Hydroxide (MILK OF MAGNESIA PO) Give 30 ml by mouth as needed for constipation may try fleets enema.    Historical Provider, MD  memantine [facility] TITRATION PAK) tablet pack Take 10 mg by mouth 2 (two) times daily.     Historical Provider, MD  metoprolol (LOPRESSOR) 50 MG tablet Take 50 mg by mouth 2 (two) times daily.    Historical Provider, MD  mirtazapine (REMERON) 15 MG tablet Take 15 mg by mouth at bedtime.    Historical Provider, MD  Nutritional Supplements (ENSURE CLEAR PO) Give ensure clear ( apple ) daily due to decreased appetite once a day    Historical Provider, MD  omeprazole (PRILOSEC) 40 MG capsule Take 40 mg by mouth 2 (two) times daily.    Historical Provider, MD  ondansetron (ZOFRAN) 4 MG tablet Take 4 mg by mouth daily at 6 (six) AM. Before meals    Historical Provider, MD  ondansetron (ZOFRAN) 4 MG tablet Take 4 mg by mouth at bedtime.    Historical Provider, MD  potassium chloride SA (K-DUR,KLOR-CON) 20 MEQ tablet Take 20 mEq by mouth daily.    Historical Provider, MD    Family  History Family History  Problem Relation Age of Onset  . Colon cancer Neg Hx     Social History Social History  Substance Use Topics  . Smoking status: Never Smoker  . Smokeless tobacco: Never Used  . Alcohol use No     Allergies   Patient has no known allergies.   Review of Systems Review of Systems  Unable to perform ROS: Mental status change     Physical Exam Updated Vital Signs BP 131/66 (BP Location: Right Arm)   Pulse 90   Temp (!) 103.5 F (39.7 C) (Rectal)   Resp (!) 35   SpO2 93%   Physical Exam  Constitutional: She is oriented to person, place, and time. She appears well-developed and well-nourished. No distress.  Drowsy. Intermittently answers questions.  HENT:  Head: Normocephalic and atraumatic.  Eyes: EOM are normal.  Neck: Normal range of motion.  Cardiovascular: Normal rate, regular rhythm and normal heart sounds.   Pulmonary/Chest: Effort normal. Tachypnea noted. She has rales.  Tachypneic. Rhonchi bilaterally.   Abdominal: Soft. She exhibits no distension. There is no tenderness.  Musculoskeletal: Normal range of motion.  Neurological: She is alert and oriented to person, place, and time.  Skin: Skin is warm and dry.  Psychiatric: She has a normal mood and affect. Judgment normal.  Nursing note and vitals reviewed.   ED Treatments / Results  DIAGNOSTIC STUDIES:  Oxygen Saturation is 93% on RA, low by my interpretation.    COORDINATION OF CARE:  10:40 PM Discussed treatment plan with pt at bedside which includes admission to the hospital and pt agreed to plan.  Labs (all labs ordered are listed, but only abnormal results are displayed) Labs Reviewed  CBC WITH DIFFERENTIAL/PLATELET - Abnormal; Notable for the following:       Result Value   Neutro Abs 8.1 (*)    All other components within normal limits  URINALYSIS, ROUTINE W REFLEX MICROSCOPIC - Abnormal; Notable for the following:    APPearance HAZY (*)    Hgb urine dipstick  MODERATE (*)    Leukocytes, UA TRACE (*)    Bacteria, UA RARE (*)    All other components within normal limits  URINE CULTURE  BASIC METABOLIC PANEL  LACTIC ACID, PLASMA  LACTIC ACID, PLASMA    EKG  EKG Interpretation None       Radiology Dg Chest Portable 1 View  Result Date: 12/25/2016 CLINICAL DATA:  83 year old female with fever and cough. EXAM: PORTABLE CHEST 1 VIEW COMPARISON:  Chest radiograph dated 04/06/2012 FINDINGS: There is background of emphysema. Patchy area of hazy density in the left mid to lower lung field is concerning for developing infiltrate. The right lung is clear. There is no significant pleural effusion. No pneumothorax. Stable top-normal cardiac silhouette. Osteopenia with old healed left humeral neck fracture. No acute  fracture. IMPRESSION: Hazy density in the left mid to lower lung field concerning for pneumonia. Clinical correlation and follow-up resolution recommended. Electronically Signed   By: Elgie CollardArash  Radparvar M.D.   On: 12/25/2016 23:11    Procedures Procedures (including critical care time)  Medications Ordered in ED Medications  vancomycin (VANCOCIN) IVPB 1000 mg/200 mL premix (not administered)  piperacillin-tazobactam (ZOSYN) IVPB 3.375 g (3.375 g Intravenous New Bag/Given 12/25/16 2328)  acetaminophen (TYLENOL) suppository 650 mg (650 mg Rectal Given 12/25/16 2228)  albuterol (PROVENTIL) (2.5 MG/3ML) 0.083% nebulizer solution 2.5 mg (2.5 mg Nebulization Given 12/25/16 2302)  sodium chloride 0.9 % bolus 1,000 mL (1,000 mLs Intravenous New Bag/Given 12/25/16 2340)     Initial Impression / Assessment and Plan / ED Course  I have reviewed the triage vital signs and the nursing notes.  Pertinent labs & imaging results that were available during my care of the patient were reviewed by me and considered in my medical decision making (see chart for details).     84yF with fever, cough, hypoxemia. CXR with infiltrate. Abx. Admit.   Final Clinical  Impressions(s) / ED Diagnoses   Final diagnoses:  Community acquired pneumonia, unspecified laterality    New Prescriptions New Prescriptions   No medications on file   I personally preformed the services scribed in my presence. The recorded information has been reviewed is accurate. Raeford RazorStephen Kalesha Irving, MD.    Raeford RazorStephen Rainie Crenshaw, MD 01/04/17 [id number]

## 2016-12-26 ENCOUNTER — Encounter (HOSPITAL_COMMUNITY): Payer: Self-pay | Admitting: *Deleted

## 2016-12-26 DIAGNOSIS — F0390 Unspecified dementia without behavioral disturbance: Secondary | ICD-10-CM

## 2016-12-26 DIAGNOSIS — I10 Essential (primary) hypertension: Secondary | ICD-10-CM | POA: Diagnosis present

## 2016-12-26 DIAGNOSIS — Z79899 Other long term (current) drug therapy: Secondary | ICD-10-CM | POA: Diagnosis not present

## 2016-12-26 DIAGNOSIS — I959 Hypotension, unspecified: Secondary | ICD-10-CM | POA: Diagnosis present

## 2016-12-26 DIAGNOSIS — J9601 Acute respiratory failure with hypoxia: Secondary | ICD-10-CM | POA: Diagnosis not present

## 2016-12-26 DIAGNOSIS — Z8619 Personal history of other infectious and parasitic diseases: Secondary | ICD-10-CM | POA: Diagnosis not present

## 2016-12-26 DIAGNOSIS — J189 Pneumonia, unspecified organism: Secondary | ICD-10-CM | POA: Diagnosis not present

## 2016-12-26 DIAGNOSIS — Z7982 Long term (current) use of aspirin: Secondary | ICD-10-CM | POA: Diagnosis not present

## 2016-12-26 DIAGNOSIS — Y95 Nosocomial condition: Secondary | ICD-10-CM | POA: Diagnosis present

## 2016-12-26 DIAGNOSIS — J45909 Unspecified asthma, uncomplicated: Secondary | ICD-10-CM | POA: Diagnosis present

## 2016-12-26 DIAGNOSIS — R6521 Severe sepsis with septic shock: Secondary | ICD-10-CM | POA: Diagnosis present

## 2016-12-26 DIAGNOSIS — Z8673 Personal history of transient ischemic attack (TIA), and cerebral infarction without residual deficits: Secondary | ICD-10-CM | POA: Diagnosis not present

## 2016-12-26 DIAGNOSIS — E119 Type 2 diabetes mellitus without complications: Secondary | ICD-10-CM | POA: Diagnosis present

## 2016-12-26 DIAGNOSIS — A419 Sepsis, unspecified organism: Secondary | ICD-10-CM | POA: Diagnosis not present

## 2016-12-26 DIAGNOSIS — E872 Acidosis: Secondary | ICD-10-CM | POA: Diagnosis present

## 2016-12-26 LAB — COMPREHENSIVE METABOLIC PANEL
ALK PHOS: 46 U/L (ref 38–126)
ALT: 13 U/L — ABNORMAL LOW (ref 14–54)
AST: 22 U/L (ref 15–41)
Albumin: 2.9 g/dL — ABNORMAL LOW (ref 3.5–5.0)
Anion gap: 10 (ref 5–15)
BILIRUBIN TOTAL: 0.7 mg/dL (ref 0.3–1.2)
BUN: 12 mg/dL (ref 6–20)
CALCIUM: 8 mg/dL — AB (ref 8.9–10.3)
CHLORIDE: 102 mmol/L (ref 101–111)
CO2: 23 mmol/L (ref 22–32)
CREATININE: 0.72 mg/dL (ref 0.44–1.00)
Glucose, Bld: 106 mg/dL — ABNORMAL HIGH (ref 65–99)
Potassium: 3.6 mmol/L (ref 3.5–5.1)
Sodium: 135 mmol/L (ref 135–145)
Total Protein: 5.5 g/dL — ABNORMAL LOW (ref 6.5–8.1)

## 2016-12-26 LAB — LACTIC ACID, PLASMA
LACTIC ACID, VENOUS: 2 mmol/L — AB (ref 0.5–1.9)
Lactic Acid, Venous: 2.2 mmol/L (ref 0.5–1.9)

## 2016-12-26 LAB — INFLUENZA PANEL BY PCR (TYPE A & B)
Influenza A By PCR: NEGATIVE
Influenza B By PCR: NEGATIVE

## 2016-12-26 LAB — CBC
HEMATOCRIT: 33.7 % — AB (ref 36.0–46.0)
HEMOGLOBIN: 11.2 g/dL — AB (ref 12.0–15.0)
MCH: 31.5 pg (ref 26.0–34.0)
MCHC: 33.2 g/dL (ref 30.0–36.0)
MCV: 94.7 fL (ref 78.0–100.0)
Platelets: 185 10*3/uL (ref 150–400)
RBC: 3.56 MIL/uL — AB (ref 3.87–5.11)
RDW: 14 % (ref 11.5–15.5)
WBC: 14.6 10*3/uL — ABNORMAL HIGH (ref 4.0–10.5)

## 2016-12-26 LAB — STREP PNEUMONIAE URINARY ANTIGEN: Strep Pneumo Urinary Antigen: NEGATIVE

## 2016-12-26 LAB — MRSA PCR SCREENING: MRSA by PCR: NEGATIVE

## 2016-12-26 MED ORDER — ONDANSETRON HCL 4 MG PO TABS: 4.0000 mg | ORAL_TABLET | Freq: Four times a day (QID) | ORAL | Status: DC | PRN

## 2016-12-26 MED ORDER — VANCOMYCIN HCL 500 MG IV SOLR
500.0000 mg | Freq: Two times a day (BID) | INTRAVENOUS | Status: DC
  Administered 2016-12-26 – 2016-12-27 (×3): 500 mg via INTRAVENOUS
  Filled 2016-12-26 (×5): qty 500

## 2016-12-26 MED ORDER — ACETAMINOPHEN 650 MG RE SUPP: 650.0000 mg | Freq: Four times a day (QID) | RECTAL | Status: DC | PRN

## 2016-12-26 MED ORDER — NOREPINEPHRINE BITARTRATE 1 MG/ML IV SOLN
0.0000 ug/min | INTRAVENOUS | Status: DC
  Filled 2016-12-26: qty 4

## 2016-12-26 MED ORDER — IPRATROPIUM-ALBUTEROL 0.5-2.5 (3) MG/3ML IN SOLN: 3.0000 mL | Freq: Four times a day (QID) | RESPIRATORY_TRACT | Status: DC | PRN

## 2016-12-26 MED ORDER — ENOXAPARIN SODIUM 40 MG/0.4ML ~~LOC~~ SOLN
40.0000 mg | SUBCUTANEOUS | Status: DC
  Administered 2016-12-26 – 2016-12-28 (×3): 40 mg via SUBCUTANEOUS
  Filled 2016-12-26 (×3): qty 0.4

## 2016-12-26 MED ORDER — SODIUM CHLORIDE 0.9 % IV SOLN
INTRAVENOUS | Status: DC
  Administered 2016-12-26 (×2): via INTRAVENOUS

## 2016-12-26 MED ORDER — GUAIFENESIN ER 600 MG PO TB12
600.0000 mg | ORAL_TABLET | Freq: Two times a day (BID) | ORAL | Status: DC
  Administered 2016-12-26 – 2016-12-29 (×8): 600 mg via ORAL
  Filled 2016-12-26 (×8): qty 1

## 2016-12-26 MED ORDER — POTASSIUM CHLORIDE CRYS ER 20 MEQ PO TBCR
20.0000 meq | EXTENDED_RELEASE_TABLET | Freq: Every day | ORAL | Status: DC
  Administered 2016-12-26 – 2016-12-29 (×4): 20 meq via ORAL
  Filled 2016-12-26 (×4): qty 1

## 2016-12-26 MED ORDER — MEMANTINE HCL 10 MG PO TABS
10.0000 mg | ORAL_TABLET | Freq: Two times a day (BID) | ORAL | Status: DC
  Administered 2016-12-26 – 2016-12-29 (×7): 10 mg via ORAL
  Filled 2016-12-26 (×7): qty 1

## 2016-12-26 MED ORDER — MEMANTINE HCL 28 X 5 MG & 21 X 10 MG PO TABS: 10.0000 mg | ORAL_TABLET | Freq: Two times a day (BID) | ORAL | Status: DC

## 2016-12-26 MED ORDER — CITALOPRAM HYDROBROMIDE 20 MG PO TABS
40.0000 mg | ORAL_TABLET | Freq: Every day | ORAL | Status: DC
  Administered 2016-12-26 – 2016-12-29 (×4): 40 mg via ORAL
  Filled 2016-12-26 (×4): qty 2

## 2016-12-26 MED ORDER — PIPERACILLIN-TAZOBACTAM 3.375 G IVPB
3.3750 g | Freq: Three times a day (TID) | INTRAVENOUS | Status: DC
  Administered 2016-12-26 – 2016-12-29 (×10): 3.375 g via INTRAVENOUS
  Filled 2016-12-26 (×7): qty 50

## 2016-12-26 MED ORDER — ONDANSETRON HCL 4 MG PO TABS
4.0000 mg | ORAL_TABLET | Freq: Every day | ORAL | Status: DC
  Administered 2016-12-26 – 2016-12-28 (×4): 4 mg via ORAL
  Filled 2016-12-26 (×4): qty 1

## 2016-12-26 MED ORDER — MIRTAZAPINE 15 MG PO TABS
15.0000 mg | ORAL_TABLET | Freq: Every day | ORAL | Status: DC
  Administered 2016-12-26 – 2016-12-28 (×4): 15 mg via ORAL
  Filled 2016-12-26 (×4): qty 1

## 2016-12-26 MED ORDER — PANTOPRAZOLE SODIUM 40 MG PO TBEC
40.0000 mg | DELAYED_RELEASE_TABLET | Freq: Every day | ORAL | Status: DC
  Administered 2016-12-26 – 2016-12-29 (×4): 40 mg via ORAL
  Filled 2016-12-26 (×4): qty 1

## 2016-12-26 MED ORDER — ONDANSETRON HCL 4 MG/2ML IJ SOLN: 4.0000 mg | Freq: Four times a day (QID) | INTRAMUSCULAR | Status: DC | PRN

## 2016-12-26 MED ORDER — ACETAMINOPHEN 325 MG PO TABS: 650.0000 mg | ORAL_TABLET | Freq: Four times a day (QID) | ORAL | Status: DC | PRN

## 2016-12-26 MED ORDER — ASPIRIN EC 81 MG PO TBEC
81.0000 mg | DELAYED_RELEASE_TABLET | Freq: Every day | ORAL | Status: DC
  Administered 2016-12-26 – 2016-12-29 (×4): 81 mg via ORAL
  Filled 2016-12-26 (×4): qty 1

## 2016-12-26 MED ORDER — LORATADINE 10 MG PO TABS
10.0000 mg | ORAL_TABLET | Freq: Every day | ORAL | Status: DC
Start: 2016-12-26 — End: 2016-12-29
  Administered 2016-12-26 – 2016-12-29 (×4): 10 mg via ORAL
  Filled 2016-12-26 (×4): qty 1

## 2016-12-26 NOTE — Progress Notes (Signed)
PROGRESS NOTE    CLANCY LEINER  JXB:[id number] DOB: [birth year] DOA: 12/25/2016 PCP: Kirstie Peri, MD   Brief Narrative:  Sabrina [name]  is a 83 y.o. female, With history of dementia, hypertension, stroke was brought to ED from skilled facility after patient complained of productive cough which began today. Patient is a poor historian, is lethargic and unable to provide any good history. The Epic notes patient's oxygen saturation was in 70s at skilled facility she was placed on 4 L of oxygen which brought O2 sats to 90's.   In the ED patient found to be febrile with temperature 103.5, hypotension with systolic blood pressure dropping to 76/40, lactic acid 2.0. Chest x-ray showed hazy density in the left mid to lower lung field concerning for pneumonia.    Assessment & Plan:   Active Problems:   Dementia   HCAP (healthcare-associated pneumonia)  Sepsis - patient presented with fever, hypotension, lactic acid 2.0 - Chest x-ray showing hazy density in the left mid to lower lung field concerning for pneumonia - Continue vancomycin and Zosyn per pharmacy consultation for healthcare associated pneumonia.  - Follow blood cultures - continue with normal saline at 100 ML per hour - levophed ordered 2/2 hypotension - titrate levophed to obtain MAP >65, SBP >90 and DBP of >60  Healthcare associated pneumonia - as above patient started on vancomycin and Zosyn - check urinary strep pneumo antigen - Follow blood culture results  Dementia - no behavior disturbance - Continue Namenda, Remeron  History of hypertension - blood pressure is low in the ED - Will hold Lopressor, Zestril   DVT Prophylaxis-   Lovenox  Code Status: Full Code Disposition Plan: guarded - patient appears critically ill   Consultants:   None  Procedures:   None  Antimicrobials:   Vancomycin  Zosyn    Subjective: Patient seen this am.  States she is at home in bed.   Objective: Vitals:   12/26/16 0700 12/26/16 0800 12/26/16 0900 12/26/16 1000  BP: (!) 95/43 (!) 100/48 117/66 (!) 112/55  Pulse: 62 62 69 67  Resp: (!) 25 (!) 25 (!) 21 (!) 23  Temp:  98.5 F (36.9 C)    TempSrc:  Oral    SpO2: 100% 100% 100% 98%  Weight:      Height:        Intake/Output Summary (Last 24 hours) at 12/26/16 1136 Last data filed at 12/26/16 0055  Gross per 24 hour  Intake             1250 ml  Output                0 ml  Net             1250 ml   Filed Weights   12/26/16 0115 12/26/16 0500  Weight: 53.5 kg (117 lb 15.1 oz) 53.5 kg (117 lb 15.1 oz)    Examination:  General exam: Appears calm and comfortable  Respiratory system: Tachypnea, rales in middle and lower lung fields bilaterally. Cardiovascular system: S1 & S2 heard, tachycardic. No JVD, murmurs, rubs, gallops or clicks. No pedal edema. Gastrointestinal system: Abdomen is nondistended, soft and nontender. No organomegaly or masses felt. Normal bowel sounds heard. Central nervous system: Alert but not oriented- demented. No focal neurological deficits. Extremities: able to move arms and legs independently. Skin: No rashes, lesions or ulcers Psychiatry:  Mood & affect appropriate.     Data Reviewed: I have personally reviewed following labs and imaging  studies  CBC:  Recent Labs Lab 12/25/16 2316 12/26/16 0446  WBC 9.4 14.6*  NEUTROABS 8.1*  --   HGB 12.7 11.2*  HCT 37.4 33.7*  MCV 93.5 94.7  PLT 209 185   Basic Metabolic Panel:  Recent Labs Lab 12/25/16 2316 12/26/16 0446  NA 133* 135  K 3.7 3.6  CL 98* 102  CO2 27 23  GLUCOSE 153* 106*  BUN 11 12  CREATININE 0.79 0.72  CALCIUM 8.8* 8.0*   GFR: Estimated Creatinine Clearance: 45.8 mL/min (by C-G formula based on SCr of 0.72 mg/dL). Liver Function Tests:  Recent Labs Lab 12/26/16 0446  AST 22  ALT 13*  ALKPHOS 46  BILITOT 0.7  PROT 5.5*  ALBUMIN 2.9*   No results for input(s): LIPASE, AMYLASE in the last 168 hours. No results for  input(s): AMMONIA in the last 168 hours. Coagulation Profile: No results for input(s): INR, PROTIME in the last 168 hours. Cardiac Enzymes: No results for input(s): CKTOTAL, CKMB, CKMBINDEX, TROPONINI in the last 168 hours. BNP (last 3 results) No results for input(s): PROBNP in the last 8760 hours. HbA1C: No results for input(s): HGBA1C in the last 72 hours. CBG: No results for input(s): GLUCAP in the last 168 hours. Lipid Profile: No results for input(s): CHOL, HDL, LDLCALC, TRIG, CHOLHDL, LDLDIRECT in the last 72 hours. Thyroid Function Tests: No results for input(s): TSH, T4TOTAL, FREET4, T3FREE, THYROIDAB in the last 72 hours. Anemia Panel: No results for input(s): VITAMINB12, FOLATE, FERRITIN, TIBC, IRON, RETICCTPCT in the last 72 hours. Sepsis Labs:  Recent Labs Lab 12/25/16 2316 12/26/16 0143  LATICACIDVEN 2.0* 2.2*    Recent Results (from the past 240 hour(s))  Culture, blood (Routine X 2) w Reflex to ID Panel     Status: None (Preliminary result)   Collection Time: 12/26/16 12:44 AM  Result Value Ref Range Status   Specimen Description LEFT ANTECUBITAL  Final   Special Requests BOTTLES DRAWN AEROBIC AND ANAEROBIC 8CC EACH  Final   Culture NO GROWTH < 12 HOURS  Final   Report Status PENDING  Incomplete  Culture, blood (Routine X 2) w Reflex to ID Panel     Status: None (Preliminary result)   Collection Time: 12/26/16 12:53 AM  Result Value Ref Range Status   Specimen Description BLOOD LEFT HAND  Final   Special Requests BOTTLES DRAWN AEROBIC AND ANAEROBIC 6CC EACH  Final   Culture NO GROWTH < 12 HOURS  Final   Report Status PENDING  Incomplete         Radiology Studies: Dg Chest Portable 1 View  Result Date: 12/25/2016 CLINICAL DATA:  83 year old female with fever and cough. EXAM: PORTABLE CHEST 1 VIEW COMPARISON:  Chest radiograph dated 04/06/2012 FINDINGS: There is background of emphysema. Patchy area of hazy density in the left mid to lower lung field is  concerning for developing infiltrate. The right lung is clear. There is no significant pleural effusion. No pneumothorax. Stable top-normal cardiac silhouette. Osteopenia with old healed left humeral neck fracture. No acute fracture. IMPRESSION: Hazy density in the left mid to lower lung field concerning for pneumonia. Clinical correlation and follow-up resolution recommended. Electronically Signed   By: Elgie Collard M.D.   On: 12/25/2016 23:11        Scheduled Meds: . aspirin EC  81 mg Oral Daily  . citalopram  40 mg Oral Daily  . enoxaparin (LOVENOX) injection  40 mg Subcutaneous Q24H  . guaiFENesin  600 mg Oral BID  .  loratadine  10 mg Oral Daily  . memantine  10 mg Oral BID  . mirtazapine  15 mg Oral QHS  . ondansetron  4 mg Oral QHS  . pantoprazole  40 mg Oral Daily  . piperacillin-tazobactam (ZOSYN)  IV  3.375 g Intravenous Q8H  . potassium chloride SA  20 mEq Oral Daily  . vancomycin  500 mg Intravenous Q12H   Continuous Infusions: . sodium chloride 100 mL/hr at 12/26/16 0127  . norepinephrine (LEVOPHED) Adult infusion       LOS: 0 days    Critical Care Time spent: 40 minutes     Katrinka BlazingAlex U Kadolph, MD [facility] Pager [phone]  If 7PM-7AM, please contact night-coverage [email or web address] Password [email or web address] 12/26/2016, 11:36 AM

## 2016-12-26 NOTE — ED Notes (Signed)
CRITICAL VALUE ALERT  Critical value received:  Lactic acid 2.0  Date of notification:  12/26/16  Time of notification:  0008  Critical value read back:Yes.    Nurse who received alert:  Bronson CurbKristy Tawsha Terrero, RN  MD notified (1st page):  Dr Juleen Chinakohut  Time of first page:  0008  MD notified (2nd page):  Time of second page:  Responding MD:  Dr Juleen Chinakohut  Time MD responded:  [phone]

## 2016-12-26 NOTE — Progress Notes (Signed)
Pharmacy Antibiotic Note  Sabrina [name] is a 83 y.o. female admitted on 12/25/2016 with pneumonia.  [facility]  Plan:  Vancomycin 500mg  IV q12h Check trough at steady state Zosyn 3.375gm IV q8h, EID Monitor labs, renal fxn, progress and c/s Deescalate ABX when improved / appropriate.    Height: 5\' 5"  (165.1 cm) Weight: 117 lb 15.1 oz (53.5 kg) IBW/kg (Calculated) : 57  Temp (24hrs), Avg:99.9 F (37.7 C), Min:97.6 F (36.4 C), Max:103.5 F (39.7 C)   Recent Labs Lab 12/25/16 2316 12/26/16 0143 12/26/16 0446  WBC 9.4  --  14.6*  CREATININE 0.79  --  0.72  LATICACIDVEN 2.0* 2.2*  --     Estimated Creatinine Clearance: 45.8 mL/min (by C-G formula based on SCr of 0.72 mg/dL).    No Known Allergies  Antimicrobials this admission: Vancomycin 3/12 >>  Zosyn 3/12 >>   Dose adjustments this admission:  Microbiology results: 3/12  BCx: pending  UCx: pending   Sputum:    MRSA PCR: pending  Thank you for allowing pharmacy to be a part of this patient's care.  Valrie HartHall, [name] A 12/26/2016 10:51 AM

## 2016-12-26 NOTE — H&P (Signed)
TRH H&P    Patient Demographics:    Sabrina [name], is a 83 y.o. female  MRN: [id number]  DOB - 01-29-1932  Admit Date - 12/25/2016  Referring MD/NP/PA: Dr. Juleen China  Outpatient Primary MD for the patient is [facility], [place]  Patient coming from: Skilled nursing facility  Chief Complaint  Patient presents with  . Shortness of Breath      HPI:    Sabrina [name]  is a 83 y.o. female, With history of dementia, hypertension, stroke was brought to ED from skilled facility after patient complained of productive cough which began today. Patient is a poor historian, is lethargic and unable to provide any good history. The Epic notes patient's oxygen saturation was in 70s at skilled facility she was placed on 4 L of oxygen which brought O2 sats to 90's.   In the ED patient found to be febrile with temperature 103.5, hypotension with systolic blood pressure dropping to 76/40, lactic acid 2.0. Chest x-ray showed hazy density in the left mid to lower lung field concerning for pneumonia.     Review of systems:    As in history of present illness, rest of review of systems unobtainable due to patient's underlying dementia and inability to provide significant history   With Past History of the following :    Past Medical History:  Diagnosis Date  . Asthma   . Dementia   . Diabetes mellitus   . H. pylori infection MAR 2014  . Hypertension   . Stroke [facility])       Past Surgical History:  Procedure Laterality Date  . CHOLECYSTECTOMY    . COLECTOMY     secondary to diverticulitis  . ESOPHAGOGASTRODUODENOSCOPY (EGD) WITH ESOPHAGEAL DILATION N/A 01/07/2013   WUJ:WJXBJYNW ring was found at the gastroesophageal junction/SINGLE DUODENAL DIVERTICULUM      Social History:      Social History  Substance Use Topics  . Smoking status: Never Smoker  . Smokeless tobacco: Never Used  . Alcohol use No       Family  History :     Family History  Problem Relation Age of Onset  . Colon cancer Neg Hx       Home Medications:   Prior to Admission medications   Medication Sig Start Date End Date Taking? Authorizing Provider  aspirin EC 81 MG tablet Take 81 mg by mouth daily.    Historical Provider, MD  Calcium Carbonate-Vitamin D (CALCIUM 500/VITAMIN D PO) Take 500 mg by mouth 2 (two) times daily.    Historical Provider, MD  citalopram (CELEXA) 40 MG tablet Take 40 mg by mouth daily.     Historical Provider, MD  hydrocortisone (ANUSOL-HC) 2.5 % rectal cream Place 1 application rectally as needed for hemorrhoids or itching.    Historical Provider, MD  ipratropium-albuterol (DUONEB) 0.5-2.5 (3) MG/3ML SOLN Take 3 mLs by nebulization every 6 (six) hours as needed.    Historical Provider, MD  lisinopril (PRINIVIL,ZESTRIL) 20 MG tablet Take 40 mg by mouth daily.     Historical Provider, MD  loratadine (CLARITIN) 10 MG tablet Take 10 mg by mouth daily.    Historical Provider, MD  Magnesium Hydroxide (MILK OF MAGNESIA PO) Give 30 ml by mouth as needed for constipation may try fleets enema.    Historical Provider, MD  [facility](NAMENDA TITRATION PAK) tablet pack Take 10 mg by mouth 2 (two) times daily.     Historical Provider, MD  metoprolol (LOPRESSOR) 50 MG tablet Take 50 mg by mouth 2 (two) times daily.    Historical Provider, MD  mirtazapine (REMERON) 15 MG tablet Take 15 mg by mouth at bedtime.    Historical Provider, MD  Nutritional Supplements (ENSURE CLEAR PO) Give ensure clear ( apple ) daily due to decreased appetite once a day    Historical Provider, MD  omeprazole (PRILOSEC) 40 MG capsule Take 40 mg by mouth 2 (two) times daily.    Historical Provider, MD  ondansetron (ZOFRAN) 4 MG tablet Take 4 mg by mouth daily at 6 (six) AM. Before meals    Historical Provider, MD  ondansetron (ZOFRAN) 4 MG tablet Take 4 mg by mouth at bedtime.    Historical Provider, MD  potassium chloride SA (K-DUR,KLOR-CON) 20  MEQ tablet Take 20 mEq by mouth daily.    Historical Provider, MD     Allergies:    No Known Allergies   Physical Exam:   Vitals  Blood pressure (!) 76/40, pulse 81, temperature 101.8 F (38.8 C), temperature source Rectal, resp. rate 22, SpO2 96 %.  1.  General: Appears lethargic  2. Psychiatric: Not tested, patient not answering questions  3. Neurologic: No focal neurological deficits, moving all extremities  4. Eyes :  anicteric sclerae, moist conjunctivae with no lid lag. PERRLA.  5. ENMT:  Oropharynx clear with moist mucous membranes and good dentition  6. Neck:  supple, no cervical lymphadenopathy appriciated, No thyromegaly  7. Respiratory : Normal respiratory effort, decreased breath sounds bilaterally  8. Cardiovascular : RRR, no gallops, rubs or murmurs, no leg edema  9. Gastrointestinal:  Positive bowel sounds, abdomen soft, non-tender to palpation,no hepatosplenomegaly, no rigidity or guarding       10. Skin:  No cyanosis, normal texture and turgor, no rash, lesions or ulcers  11.Musculoskeletal:  Good muscle tone,  joints appear normal , no effusions,  normal range of motion    Data Review:    CBC  Recent Labs Lab 12/25/16 2316  WBC 9.4  HGB 12.7  HCT 37.4  PLT 209  MCV 93.5  MCH 31.8  MCHC 34.0  RDW 13.9  LYMPHSABS 0.9  MONOABS 0.4  EOSABS 0.0  BASOSABS 0.0   ------------------------------------------------------------------------------------------------------------------  Chemistries   Recent Labs Lab 12/25/16 2316  NA 133*  K 3.7  CL 98*  CO2 27  GLUCOSE 153*  BUN 11  CREATININE 0.79  CALCIUM 8.8*    ------------------------------------------------------------------------------------------------------------------  ------------------------------------------------------------------------------------------------------------------  --------------------------------------------------------------------------------------------------------------- Urine analysis:    Component Value Date/Time   COLORURINE YELLOW 12/25/2016 2245   APPEARANCEUR HAZY (A) 12/25/2016 2245   LABSPEC 1.013 12/25/2016 2245   PHURINE 5.0 12/25/2016 2245   GLUCOSEU NEGATIVE 12/25/2016 2245   HGBUR MODERATE (A) 12/25/2016 2245   BILIRUBINUR NEGATIVE 12/25/2016 2245   KETONESUR NEGATIVE 12/25/2016 2245   PROTEINUR NEGATIVE 12/25/2016 2245   UROBILINOGEN 0.2 04/06/2012 1600   NITRITE NEGATIVE 12/25/2016 2245   LEUKOCYTESUR TRACE (A) 12/25/2016 2245      Imaging Results:    Dg Chest Portable 1 View  Result Date: 12/25/2016 CLINICAL DATA:  83 year old female  with fever and cough. EXAM: PORTABLE CHEST 1 VIEW COMPARISON:  Chest radiograph dated 04/06/2012 FINDINGS: There is background of emphysema. Patchy area of hazy density in the left mid to lower lung field is concerning for developing infiltrate. The right lung is clear. There is no significant pleural effusion. No pneumothorax. Stable top-normal cardiac silhouette. Osteopenia with old healed left humeral neck fracture. No acute fracture. IMPRESSION: Hazy density in the left mid to lower lung field concerning for pneumonia. Clinical correlation and follow-up resolution recommended. Electronically Signed   By: Elgie Collard M.D.   On: 12/25/2016 23:11       Assessment & Plan:    Active Problems:   Dementia   HCAP (healthcare-associated pneumonia)   1. Sepsis- patient presented with fever, hypotension, lactic acid 2.0. Chest x-ray showing hazy density in the left mid to lower lung field concerning for pneumonia. Will start patient on vancomycin and Zosyn per  pharmacy consultation for healthcare associated pneumonia. Follow blood cultures. Patient received IV fluid boluses in the ED per sepsis protocol, will continue with normal saline at 100 ML per hour 2. Healthcare associated pneumonia- as above patient started on vancomycin and Zosyn. Will check urinary strep pneumo antigen. Follow blood culture results. 3. Dementia- no behavior disturbance. Patient is lethargic. Continue Namenda, Remeron. 4. History of hypertension- blood pressure is low in the ED, will hold Lopressor, Zestril.   DVT Prophylaxis-   Lovenox   AM Labs Ordered, also please review Full Orders  Family Communication: No family present at bedside  Code Status:  Full code  Admission status: Observation    Time spent in minutes : 60 minutes   Frazier Balfour S M.D on 12/26/2016 at 12:21 AM  Between 7am to 7pm - Pager - [phone]. After 7pm go to [email or web address] - password [facility]  [facility] - Office  [phone]

## 2016-12-27 DIAGNOSIS — J9601 Acute respiratory failure with hypoxia: Secondary | ICD-10-CM

## 2016-12-27 DIAGNOSIS — A419 Sepsis, unspecified organism: Principal | ICD-10-CM

## 2016-12-27 DIAGNOSIS — J189 Pneumonia, unspecified organism: Secondary | ICD-10-CM

## 2016-12-27 LAB — BASIC METABOLIC PANEL
ANION GAP: 7 (ref 5–15)
BUN: 13 mg/dL (ref 6–20)
CO2: 25 mmol/L (ref 22–32)
Calcium: 7.6 mg/dL — ABNORMAL LOW (ref 8.9–10.3)
Chloride: 105 mmol/L (ref 101–111)
Creatinine, Ser: 0.72 mg/dL (ref 0.44–1.00)
Glucose, Bld: 67 mg/dL (ref 65–99)
POTASSIUM: 3.2 mmol/L — AB (ref 3.5–5.1)
SODIUM: 137 mmol/L (ref 135–145)

## 2016-12-27 LAB — URINE CULTURE: Culture: NO GROWTH

## 2016-12-27 NOTE — Care Management Note (Signed)
Case Management Note  Patient Details  Name: Sabrina [name] MRN: [id number] Date of Birth: [birth year]  Subjective/Objective:     [facility] Plans to return when medically stable.                Action/Plan: CSW aware and making arrangements for return to SNF.    Expected Discharge Date:  12/29/16               Expected Discharge Plan:  Skilled Nursing Facility  In-House Referral:  Clinical Social Work  Discharge planning Services     Post Acute Care Choice:  NA Choice offered to:  NA  DME Arranged:    DME Agency:     HH Arranged:    HH Agency:     Status of Service:  In process, will continue to follow  If discussed at Long Length of Stay Meetings, dates discussed:    Additional Comments:  Manvi Guilliams, Chrystine OilerSharley Diane, RN 12/27/2016, 11:38 AM

## 2016-12-27 NOTE — Evaluation (Signed)
Physical Therapy Evaluation Patient Details Name: Enzo Biudrey K Delisa MRN: [id number] DOB: 05/23/1932 Today's Date: 12/27/2016   History of Present Illness  83 y.o. female, With history of dementia, hypertension, stroke was brought to ED from skilled facility after patient complained of productive cough which began today. Patient is a poor historian, is lethargic and unable to provide any good history. The Epic notes patient's oxygen saturation was in 70s at skilled facility she was placed on 4 L of oxygen which brought O2 sats to 90's.   CXR is consistent with HCAP.      Clinical Impression  Pt received in bed, son present initially, but left during mobility portion of evaluation, and then dtr present.  [facility] and required assistance for transfers bed<>w/c, dressing, and bathing.  She was able to self-propel her w/c.  During PT evaluation today, she required Min a for rolling each direction due to large watery BM.  RN present to assist with hygiene.  Mod A for supine<>sit, and Mod A +2 for squat pivot transfer bed<>chair.  [facility] as dtr states she is practically at her baseline.  Will continue to see pt while in acute care setting to ensure she does not develop further weakness from this hospitalization.      Follow Up Recommendations Other (comment) ([facility]    Equipment Recommendations  None recommended by PT    Recommendations for Other Services       Precautions / Restrictions Precautions Precautions: Fall Precaution Comments: due to immobility Restrictions Weight Bearing Restrictions: No      Mobility  Bed Mobility Overal bed mobility: Needs Assistance Bed Mobility: Supine to Sit;Rolling Rolling: Min assist (For hygiene after BM - RN aware of large amount of diarrhea.  )   Supine to sit: Mod assist        Transfers Overall transfer level: Needs assistance   Transfers: Squat Pivot Transfers     Squat pivot transfers: Mod  assist;+2 physical assistance        Ambulation/Gait                Stairs            Wheelchair Mobility    Modified Rankin (Stroke Patients Only)       Balance Overall balance assessment: Needs assistance Sitting-balance support: Bilateral upper extremity supported;Feet supported Sitting balance-Leahy Scale: Fair Sitting balance - Comments: Close supervision provided while pt was sitting on the EOB                                     Pertinent Vitals/Pain Pain Assessment: No/denies pain    Home Living     Available Help at Discharge: Skilled Nursing Facility           Home Equipment: Wheelchair - manual;Hospital bed (Lift chair)      Prior Function Level of Independence: Needs assistance   Gait / Transfers Assistance Needed: [facility]  She states she uses both her UE's and LE's to propel herself.  She requires assistance to transfer bed<>w/c.    ADL's / Homemaking Assistance Needed: She requires assistance for dressing and bathing         Hand Dominance        Extremity/Trunk Assessment   Upper Extremity Assessment Upper Extremity Assessment: Generalized weakness    Lower  Extremity Assessment Lower Extremity Assessment: Generalized weakness       Communication   Communication: No difficulties  Cognition Arousal/Alertness: Awake/alert Behavior During Therapy: WFL for tasks assessed/performed Overall Cognitive Status: History of cognitive impairments - at baseline Area of Impairment: Orientation Orientation Level: Disoriented to;Time;Situation                  General Comments      Exercises     Assessment/Plan    PT Assessment Patient needs continued PT services  PT Problem List Decreased strength;Decreased activity tolerance;Decreased balance;Decreased mobility       PT Treatment Interventions Functional mobility training;Therapeutic activities;Therapeutic  exercise;Balance training    PT Goals (Current goals can be found in the Care Plan section)  Acute Rehab PT Goals Patient Stated Goal: To maintain mobility  PT Goal Formulation: With patient/family Time For Goal Achievement: 01/03/17 Potential to Achieve Goals: Fair    Frequency Min 3X/week   Barriers to discharge        Co-evaluation               End of Session Equipment Utilized During Treatment: Gait belt Activity Tolerance: Patient tolerated treatment well Patient left: in chair;with call bell/phone within reach;with nursing/sitter in room Nurse Communication: Mobility status Lyman Bishop, RN assisted with transfer, and is aware of pt's mobiltiy status.  Mobility sheet left in the room. ) PT Visit Diagnosis: Muscle weakness (generalized) (M62.81)    Functional Assessment Tool Used: AM-PAC 6 Clicks Basic Mobility;Clinical judgement Functional Limitation: Mobility: Walking and moving around Mobility: Walking and Moving Around Current Status (O9629): At least 60 percent but less than 80 percent impaired, limited or restricted Mobility: Walking and Moving Around Goal Status [phone]): At least 40 percent but less than 60 percent impaired, limited or restricted    Time: 1354-1420 PT Time Calculation (min) (ACUTE ONLY): 26 min   Charges:   PT Evaluation $PT Eval Low Complexity: 1 Procedure PT Treatments $Therapeutic Activity: 8-22 mins   PT G Codes:   PT G-Codes **NOT FOR INPATIENT CLASS** Functional Assessment Tool Used: AM-PAC 6 Clicks Basic Mobility;Clinical judgement Functional Limitation: Mobility: Walking and moving around Mobility: Walking and Moving Around Current Status (L2440): At least 60 percent but less than 80 percent impaired, limited or restricted Mobility: Walking and Moving Around Goal Status [phone]): At least 40 percent but less than 60 percent impaired, limited or restricted     Beth Carey Lafon, PT, DPT X: [phone]

## 2016-12-27 NOTE — Progress Notes (Signed)
PROGRESS NOTE  Sabrina [name] [id number]:[id number] DOB: 23-May-1932 DOA: 12/25/2016 PCP: Kirstie PeriSHAH,[name], MD  Brief Narrative: 83 year old woman PMH dementia, long-term resident of nursing facility, percent with cough and hypoxia. Febrile, hypotensive, elevated lactic acid. Admitted for septic shock requiring vasopressor support secondary to pneumonia.  Assessment/Plan 1. Sepsis secondary to pneumonia.  Resolved, normotensive. MRSA PCR negative. Discontinue vancomycin. Continue Zosyn. 2. Pneumonia  Improving, plan as above. 3. Acute hypoxic respiratory failure secondary to pneumonia  Wean oxygen as tolerated 4. Dementia  Stable   Overall seems to be improving. Anticipate discharge next 2-3 days.  DVT prophylaxis: Lovenox Code Status: full code Family Communication: none Disposition Plan: Long-term resident of nursing facility   Brendia Sacksaniel Mike Hamre, MD  [facility] Direct contact: [phone] --Via amion app OR  --[email or web address]; password [email or web address]  7PM-7AM contact night coverage as above 12/27/2016, 9:55 AM  LOS: 1 day   Consultants:    Procedures:    Antimicrobials:  Zosyn 3/12 >>  Vancomycin 3/12 >>  Interval history/Subjective: Coughing but otherwise feeling better. Breathing okay except with coughing. Eating. No nausea or vomiting  Objective: Vitals:   12/27/16 0600 12/27/16 0700 12/27/16 0800 12/27/16 0900  BP: 133/64 (!) 170/145 (!) 149/65 (!) 144/65  Pulse: 76 83 73 74  Resp: 19 19 (!) 22 (!) 23  Temp:      TempSrc:      SpO2: 90% 100% 100% 97%  Weight:      Height:        Intake/Output Summary (Last 24 hours) at 12/27/16 0955 Last data filed at 12/27/16 0900  Gross per 24 hour  Intake             3555 ml  Output                0 ml  Net             3555 ml     Filed Weights   12/26/16 0115 12/26/16 0500 12/27/16 0500  Weight: 53.5 kg (117 lb 15.1 oz) 53.5 kg (117 lb 15.1 oz) 54.9 kg (121 lb 0.5 oz)    Exam:    Constitutional:   Appears  calm, comfortable.  Respiratory: Clear to auscultation bilaterally on the right. Left rhonchi noted. No wheezes or rales. Mild increased respiratory effort.  Cardiovascular regular rate and rhythm. No murmur, rub or gallop. No lower extremity edema.  Psychiatric. Grossly normal mood and affect. Speech fluent and appropriate.  I have personally reviewed following labs and imaging studies: Potassium 3.2, remainder BMP unremarkable.  Blood cultures no growth to date   Scheduled Meds: . aspirin EC  81 mg Oral Daily  . citalopram  40 mg Oral Daily  . enoxaparin (LOVENOX) injection  40 mg Subcutaneous Q24H  . guaiFENesin  600 mg Oral BID  . loratadine  10 mg Oral Daily  . memantine  10 mg Oral BID  . mirtazapine  15 mg Oral QHS  . ondansetron  4 mg Oral QHS  . pantoprazole  40 mg Oral Daily  . piperacillin-tazobactam (ZOSYN)  IV  3.375 g Intravenous Q8H  . potassium chloride SA  20 mEq Oral Daily  . vancomycin  500 mg Intravenous Q12H   Continuous Infusions: . sodium chloride 100 mL/hr at 12/26/16 1523  . norepinephrine (LEVOPHED) Adult infusion Stopped (12/26/16 0845)    Active Problems:   Dementia   HCAP (healthcare-associated pneumonia)   Community acquired pneumonia   LOS: 1 day

## 2016-12-27 NOTE — Clinical Social Work Note (Signed)
Clinical Social Work Assessment  Patient Details  Name: Sabrina [name] MRN: [id number] Date of Birth: 1932/07/16  Date of referral:  12/27/16               Reason for consult:  Discharge Planning                Permission sought to share information with:    Permission granted to share information::     Name::        Agency::     Relationship::     Contact Information:  Message left for son, Mr. Felipa FurnaceGarcia.  Housing/Transportation Living arrangements for the past 2 months:  Skilled Nursing Facility Source of Information:  Facility Patient Interpreter Needed:  None Criminal Activity/Legal Involvement Pertinent to Current Situation/Hospitalization:  No - Comment as needed Significant Relationships:  Adult Children Lives with:  Facility Resident Do you feel safe going back to the place where you live?  Yes Need for family participation in patient care:  Yes (Comment)  Care giving concerns:  Facility resident.   Social Worker assessment / plan:  [facility] has been at the facility since 06/2016 as a result of an APS report regarding her and her husband being unable to care for one another. Patient and her husband initially came to the facility together, however her husband died shortly after they were place. Patient uses a wheelchair, is incontinent, has ADLs completed by staff and has support from her adult children. Patient can return to the facility at discharge. Patient will not need a new FL2 unless she is assessed by PT and recommended for PT.  Patient's son, Mr. Felipa FurnaceGarcia, confirmed Keri's statements. He stated that the family plans on patient returning to the facility at discharge.    Employment status:  Retired Database administratornsurance information:  Managed Medicare PT Recommendations:  Not assessed at this time Information / Referral to community resources:     Patient/Family's Response to care:  Family [facility] agree[facility].    Patient/Family's Understanding of and Emotional Response to Diagnosis, Current Treatment, and Prognosis:  Patient's family understands patient's diagnosis, treatment and prognosis.   Emotional Assessment Appearance:  Appears stated age Attitude/Demeanor/Rapport:   (Cooperative) Affect (typically observed):  Accepting Orientation:  Oriented to Self, Oriented to  Time, Oriented to Place Alcohol / Substance use:  Not Applicable Psych involvement (Current and /or in the community):  No (Comment)  Discharge Needs  Concerns to be addressed:  Discharge Planning Concerns Readmission within the last 30 days:  Yes Current discharge risk:  None Barriers to Discharge:  No Barriers Identified   Annice NeedySettle, Charma Mocarski D, LCSW 12/27/2016, 10:34 AM

## 2016-12-28 NOTE — Progress Notes (Signed)
  PROGRESS NOTE  Sabrina [name] [id number]:[id number] DOB: 1932-03-04 DOA: 12/25/2016 PCP: Kirstie PeriSHAH,[name], MD  Brief Narrative: 83 year old woman PMH dementia, long-term resident of nursing facility, percent with cough and hypoxia. Febrile, hypotensive, elevated lactic acid. Admitted for septic shock requiring vasopressor support secondary to pneumonia.  Assessment/Plan 1. Sepsis secondary to pneumonia.  Hemodynamics are stable. 2. Pneumonia  Still with increased respiratory effort and but afebrile and hemodynamically stable. 3. Acute hypoxic respiratory failure secondary to pneumonia  Appears resolved at this point. 4. Dementia  Remains stable.   Overall slowly improving, continue treatment for pneumonia, given tachypnea and increased respiratory effort, not yet ready for discharge. Anticipate discharge next 48 hours.  DVT prophylaxis: Lovenox Code Status: full code Family Communication: none Disposition Plan: Long-term resident of nursing facility   Brendia Sacksaniel Dalynn Jhaveri, MD  [facility] Direct contact: [phone] --Via amion app OR  --[email or web address]; password [email or web address]  7PM-7AM contact night coverage as above 12/28/2016, 11:08 AM  LOS: 2 days   Consultants:    Procedures:    Antimicrobials:  Zosyn 3/12 >>  Vancomycin 3/12 >> 3/14  Interval history/Subjective: Feeling better except for cough. Breathing better. No other complaints.  Objective: Vitals:   12/28/16 0400 12/28/16 0500 12/28/16 0600 12/28/16 0756  BP:   (!) 167/88   Pulse:      Resp:      Temp: 98.6 F (37 C)   98.9 F (37.2 C)  TempSrc: Oral   Oral  SpO2:      Weight:  53.8 kg (118 lb 9.7 oz)    Height:        Intake/Output Summary (Last 24 hours) at 12/28/16 1108 Last data filed at 12/28/16 0744  Gross per 24 hour  Intake              580 ml  Output                0 ml  Net              580 ml     Filed Weights   12/26/16 0500 12/27/16 0500 12/28/16 0500  Weight: 53.5 kg (117 lb 15.1  oz) 54.9 kg (121 lb 0.5 oz) 53.8 kg (118 lb 9.7 oz)    Exam: Constitutional: Appears weak but comfortable  Respiratory generally clear to auscultation, no wheezes, rales or rhonchi. Mild increased respiratory effort, tachypnea noted  Cardiovascular regular rate and rhythm, no murmur, rub or gallop. No lower extremity edema. Telemetry sinus rhythm.  I have personally reviewed following labs and imaging studies: No new labs  Scheduled Meds: . aspirin EC  81 mg Oral Daily  . citalopram  40 mg Oral Daily  . enoxaparin (LOVENOX) injection  40 mg Subcutaneous Q24H  . guaiFENesin  600 mg Oral BID  . loratadine  10 mg Oral Daily  . memantine  10 mg Oral BID  . mirtazapine  15 mg Oral QHS  . ondansetron  4 mg Oral QHS  . pantoprazole  40 mg Oral Daily  . piperacillin-tazobactam (ZOSYN)  IV  3.375 g Intravenous Q8H  . potassium chloride SA  20 mEq Oral Daily   Continuous Infusions:   Principal Problem:   Sepsis (HCC) Active Problems:   Dementia   Pneumonia   Acute hypoxemic respiratory failure (HCC)   LOS: 2 days

## 2016-12-28 NOTE — Progress Notes (Signed)
Spoke with daughter in Social workerlaw, Velna HatchetSheila. Updated patient emergency contacts. Jillyn HiddenGary, her son, is the HPOA, and Velna HatchetSheila is the [employer]urable Power of Attorney.  Genelle Balameron D Tiffine Henigan, RN

## 2016-12-29 ENCOUNTER — Inpatient Hospital Stay
Admission: RE | Admit: 2016-12-29 | Discharge: 2017-11-13 | Disposition: A | Discharge status: Nursing Home (Includes ICF) | Payer: Medicare Other | Source: Ambulatory Visit | Attending: Internal Medicine | Admitting: Internal Medicine

## 2016-12-29 DIAGNOSIS — J189 Pneumonia, unspecified organism: Principal | ICD-10-CM

## 2016-12-29 DIAGNOSIS — R1110 Vomiting, unspecified: Secondary | ICD-10-CM

## 2016-12-29 MED ORDER — AMOXICILLIN-POT CLAVULANATE 875-125 MG PO TABS: 1.0000 | ORAL_TABLET | Freq: Two times a day (BID) | ORAL | Status: DC

## 2016-12-29 NOTE — Care Management Important Message (Signed)
Important Message  Patient Details  Name: Enzo Biudrey K Albarran MRN: [id number] Date of Birth: 09-24-1932   Medicare Important Message Given:  Yes    Jaimee Corum, Chrystine OilerSharley Diane, RN 12/29/2016, 2:15 PM

## 2016-12-29 NOTE — Progress Notes (Signed)
Hand off given from nurse Pt in bed resting with call within reach. Nothing needed at this time.

## 2016-12-29 NOTE — Progress Notes (Signed)
Pt IV out, [facility]  [facility]

## 2016-12-29 NOTE — Clinical Social Work Note (Signed)
CSW updated [facility] on pt. Facility remains agreeable to return over weekend if stable.  Derenda FennelKara Reneshia Zuccaro, LCSW [phone]

## 2016-12-29 NOTE — Discharge Summary (Signed)
Physician Discharge Summary  Sabrina [name] WJX:[id number] DOB: 1931/12/02 DOA: 12/25/2016  PCP: Kirstie Peri, MD  Admit date: 12/25/2016 Discharge date: 12/29/2016  Recommendations for Outpatient Follow-up:  1. Resolution of pneumonia, wean oxygen as tolerated  Follow-up with PCP in 1 week Follow-up Information    SHAH,[name], MD. Schedule an appointment as soon as possible for a visit in 1 week(s).   Specialty:  Internal Medicine Contact information: [address] [place] [address] [phone]          Discharge Diagnoses:  1. Sepsis secondary to pneumonia 2. Pneumonia 3. Acute hypoxic respiratory failure 4. Dementia  Discharge Condition: Improved Disposition: Return to skilled nursing facility  Diet recommendation: Resume previous diet  Filed Weights   12/26/16 0500 12/27/16 0500 12/28/16 0500  Weight: 53.5 kg (117 lb 15.1 oz) 54.9 kg (121 lb 0.5 oz) 53.8 kg (118 lb 9.7 oz)    History of present illness:  83 year old woman PMH dementia, long-term resident of nursing facility, percent with cough and hypoxia. Febrile, hypotensive, elevated lactic acid. Admitted for septic shock requiring vasopressor support secondary to pneumonia.  Hospital Course:  Patient was treated for sepsis with empiric antibiotics with rapid improvement in pneumonia. Hypoxia stabilized, gradual improvement is expected. Hospitalization was uncomplicated. Plan for return to skilled nursing facility, wean oxygen as tolerated, complete antibiotic course with oral antibiotics.  Antimicrobials:  Zosyn 3/12 >> 3/16  Augmentin 3/16 >> 3/19  Vancomycin 3/12 >> 3/14   Discharge Instructions   Allergies as of 12/29/2016   No Known Allergies     Medication List    TAKE these medications   amoxicillin-clavulanate 875-125 MG tablet Commonly known as:  AUGMENTIN Take 1 tablet by mouth 2 (two) times daily. Last dose 3/19   aspirin EC 81 MG tablet Take 81 mg by mouth daily.   CALCIUM  500/VITAMIN D PO Take 500 mg by mouth 2 (two) times daily.   citalopram 20 MG tablet Commonly known as:  CELEXA Take 20 mg by mouth daily.   ENSURE CLEAR PO Give ensure clear ( apple ) daily due to decreased appetite once a day   fluticasone 50 MCG/ACT nasal spray Commonly known as:  FLONASE Place 1 spray into both nostrils 2 (two) times daily as needed for rhinitis.   ipratropium-albuterol 0.5-2.5 (3) MG/3ML Soln Commonly known as:  DUONEB Take 3 mLs by nebulization every 6 (six) hours as needed (wheezing; shortness of breath).   lisinopril 20 MG tablet Commonly known as:  PRINIVIL,ZESTRIL Take 40 mg by mouth daily.   loratadine 10 MG tablet Commonly known as:  CLARITIN Take 10 mg by mouth daily.   metoprolol 50 MG tablet Commonly known as:  LOPRESSOR Take 50 mg by mouth 2 (two) times daily.   mirtazapine 15 MG tablet Commonly known as:  REMERON Take 15 mg by mouth at bedtime.   NAMENDA TITRATION PAK tablet pack Generic drug:  memantine Take 10 mg by mouth 2 (two) times daily.   omeprazole 40 MG capsule Commonly known as:  PRILOSEC Take 40 mg by mouth 2 (two) times daily.   ondansetron 4 MG tablet Commonly known as:  ZOFRAN Take 4 mg by mouth daily at 6 (six) AM. Before meals   ondansetron 4 MG tablet Commonly known as:  ZOFRAN Take 4 mg by mouth at bedtime.   potassium chloride SA 20 MEQ tablet Commonly known as:  K-DUR,KLOR-CON Take 20 mEq by mouth daily.      No Known Allergies  The results  of significant diagnostics from this hospitalization (including imaging, microbiology, ancillary and laboratory) are listed below for reference.    Significant Diagnostic Studies: Dg Chest Portable 1 View  Result Date: 12/25/2016 CLINICAL DATA:  83 year old female with fever and cough. EXAM: PORTABLE CHEST 1 VIEW COMPARISON:  Chest radiograph dated 04/06/2012 FINDINGS: There is background of emphysema. Patchy area of hazy density in the left mid to lower lung  field is concerning for developing infiltrate. The right lung is clear. There is no significant pleural effusion. No pneumothorax. Stable top-normal cardiac silhouette. Osteopenia with old healed left humeral neck fracture. No acute fracture. IMPRESSION: Hazy density in the left mid to lower lung field concerning for pneumonia. Clinical correlation and follow-up resolution recommended. Electronically Signed   By: Elgie CollardArash  Radparvar M.D.   On: 12/25/2016 23:11    Microbiology: Recent Results (from the past 240 hour(s))  Urine culture     Status: None   Collection Time: 12/25/16 10:45 PM  Result Value Ref Range Status   Specimen Description URINE, CLEAN CATCH  Final   Special Requests NONE  Final   Culture   Final    NO GROWTH Performed at [facility], [address] [place], [place] [address]    Report Status 12/27/2016 FINAL  Final  Culture, blood (Routine X 2) w Reflex to ID Panel     Status: None (Preliminary result)   Collection Time: 12/26/16 12:44 AM  Result Value Ref Range Status   Specimen Description LEFT ANTECUBITAL  Final   Special Requests BOTTLES DRAWN AEROBIC AND ANAEROBIC 8CC EACH  Final   Culture NO GROWTH 3 DAYS  Final   Report Status PENDING  Incomplete  Culture, blood (Routine X 2) w Reflex to ID Panel     Status: None (Preliminary result)   Collection Time: 12/26/16 12:53 AM  Result Value Ref Range Status   Specimen Description BLOOD LEFT HAND  Final   Special Requests BOTTLES DRAWN AEROBIC AND ANAEROBIC 6CC EACH  Final   Culture NO GROWTH 3 DAYS  Final   Report Status PENDING  Incomplete  MRSA PCR Screening     Status: None   Collection Time: 12/26/16  1:03 AM  Result Value Ref Range Status   MRSA by PCR NEGATIVE NEGATIVE Final    Comment:        The GeneXpert MRSA Assay (FDA approved for NASAL specimens only), is one component of a comprehensive MRSA colonization surveillance program. It is not intended to diagnose MRSA infection nor to guide  or monitor treatment for MRSA infections.      Labs: Basic Metabolic Panel:  Recent Labs Lab 12/25/16 2316 12/26/16 0446 12/27/16 0430  NA 133* 135 137  K 3.7 3.6 3.2*  CL 98* 102 105  CO2 27 23 25   GLUCOSE 153* 106* 67  BUN 11 12 13   CREATININE 0.79 0.72 0.72  CALCIUM 8.8* 8.0* 7.6*   Liver Function Tests:  Recent Labs Lab 12/26/16 0446  AST 22  ALT 13*  ALKPHOS 46  BILITOT 0.7  PROT 5.5*  ALBUMIN 2.9*   CBC:  Recent Labs Lab 12/25/16 2316 12/26/16 0446  WBC 9.4 14.6*  NEUTROABS 8.1*  --   HGB 12.7 11.2*  HCT 37.4 33.7*  MCV 93.5 94.7  PLT 209 185    Principal Problem:   Sepsis (HCC) Active Problems:   Dementia   Pneumonia   Acute hypoxemic respiratory failure (HCC)   Time coordinating discharge: 25 minutes  Signed:  Brendia Sacks, MD [facility] 12/29/2016, 11:21 AM

## 2016-12-29 NOTE — Clinical Social Work Note (Addendum)
[facility] Pt's [facility], Jillyn HiddenGary and [facility] aware and agreeable. Will transfer with staff.  Derenda FennelKara Sravya Grissom, LCSW [phone]

## 2016-12-30 ENCOUNTER — Encounter (HOSPITAL_COMMUNITY)
Admission: RE | Admit: 2016-12-30 | Discharge: 2016-12-30 | Disposition: A | Discharge status: Home / Self Care | Payer: Medicare Other | Source: Skilled Nursing Facility | Attending: Pediatrics | Admitting: Pediatrics

## 2016-12-30 DIAGNOSIS — R7989 Other specified abnormal findings of blood chemistry: Secondary | ICD-10-CM | POA: Insufficient documentation

## 2016-12-30 DIAGNOSIS — E875 Hyperkalemia: Secondary | ICD-10-CM | POA: Insufficient documentation

## 2016-12-30 DIAGNOSIS — R6889 Other general symptoms and signs: Secondary | ICD-10-CM | POA: Insufficient documentation

## 2016-12-30 LAB — CBC
HEMATOCRIT: 35 % — AB (ref 36.0–46.0)
HEMOGLOBIN: 11.7 g/dL — AB (ref 12.0–15.0)
MCH: 31.3 pg (ref 26.0–34.0)
MCHC: 33.4 g/dL (ref 30.0–36.0)
MCV: 93.6 fL (ref 78.0–100.0)
PLATELETS: 212 10*3/uL (ref 150–400)
RBC: 3.74 MIL/uL — AB (ref 3.87–5.11)
RDW: 13.8 % (ref 11.5–15.5)
WBC: 9.7 10*3/uL (ref 4.0–10.5)

## 2016-12-30 LAB — BASIC METABOLIC PANEL
ANION GAP: 8 (ref 5–15)
BUN: 15 mg/dL (ref 6–20)
CHLORIDE: 111 mmol/L (ref 101–111)
CO2: 20 mmol/L — ABNORMAL LOW (ref 22–32)
Calcium: 7.9 mg/dL — ABNORMAL LOW (ref 8.9–10.3)
Creatinine, Ser: 0.69 mg/dL (ref 0.44–1.00)
GFR calc Af Amer: >60 mL/min (ref >60)
GLUCOSE: 127 mg/dL — AB (ref 65–99)
POTASSIUM: 4.2 mmol/L (ref 3.5–5.1)
Sodium: 139 mmol/L (ref 135–145)

## 2017-01-01 ENCOUNTER — Encounter: Payer: Self-pay | Admitting: Internal Medicine

## 2017-01-01 ENCOUNTER — Non-Acute Institutional Stay (SKILLED_NURSING_FACILITY): Payer: Medicare Other | Admitting: Internal Medicine

## 2017-01-01 DIAGNOSIS — F331 Major depressive disorder, recurrent, moderate: Secondary | ICD-10-CM

## 2017-01-01 DIAGNOSIS — F0390 Unspecified dementia without behavioral disturbance: Secondary | ICD-10-CM

## 2017-01-01 DIAGNOSIS — I10 Essential (primary) hypertension: Secondary | ICD-10-CM

## 2017-01-01 DIAGNOSIS — R21 Rash and other nonspecific skin eruption: Secondary | ICD-10-CM | POA: Diagnosis not present

## 2017-01-01 DIAGNOSIS — J181 Lobar pneumonia, unspecified organism: Secondary | ICD-10-CM

## 2017-01-01 DIAGNOSIS — K224 Dyskinesia of esophagus: Secondary | ICD-10-CM

## 2017-01-01 DIAGNOSIS — J189 Pneumonia, unspecified organism: Secondary | ICD-10-CM

## 2017-01-01 LAB — CULTURE, BLOOD (ROUTINE X 2)
Culture: NO GROWTH
Culture: NO GROWTH

## 2017-01-01 NOTE — Progress Notes (Signed)
Provider: Tysheka Fanguy,Jaelle Campanile  Location:   [facility] Room Number: 115/W Place of Service:  SNF (31)  PCP: Kirstie PeriSHAH,[name], MD Patient Sabrina [name] Team: Kirstie PeriAshish Shah, MD as PCP - General (Internal Medicine) [place] BaliSandi L Fields, MD as Attending Physician (Gastroenterology)  Extended Emergency Contact Information Primary Emergency Contact: [name],[name]  [place] Home Phone: [phone] Relation: Son Secondary Emergency Contact: [name],[name]  [place] Home Phone: [phone] Mobile Phone: [phone] Relation: Son  Code Status: Full code Goals of Care: Advanced Directive information Advanced Directives 01/01/2017  Does Patient Have a Medical Advance Directive? Yes  Type of Advance Directive (No Data)  Does patient want to make changes to medical advance directive? No - Patient declined  Copy of Healthcare Power of Attorney in Chart? -  Pre-existing out of facility DNR order (yellow form or pink MOST form) -      Chief Complaint  Patient presents with  . Readmit To SNF    HPI: Patient is a 83 y.o. female seen today for admission to SNF for Long term Care.  Patient has H/O  Hypertension, recurrent Vomiting, Falls, GERD and Dementia. And recent diagnosis of Osteoporosis. She was admitted in the hospital with Fever , Respiratory distress , Change in mental status and Hypotension. She was found to have Left Mid to lower lobe Pneumonia with sepsis. Blood cultures were negative. She responded well to Vancomycin and Zosyn. She was discharged on Augmentin. Since being in the facility patient is doing well with no fever. She usually never complains but today she was c/o hurting in her Bottom. Also c/o cough . Denied any SOB. Per nurses patient also had few episodes of Diarrhea since yesterday. Patient is long term resident of facility and will continue therapy in the facility. Past Medical History:  Diagnosis Date  . Asthma   . Dementia   .  Diabetes mellitus   . H. pylori infection MAR 2014  . Hypertension   . Stroke [facility](HCC)    Past Surgical History:  Procedure Laterality Date  . CHOLECYSTECTOMY    . COLECTOMY     secondary to diverticulitis  . ESOPHAGOGASTRODUODENOSCOPY (EGD) WITH ESOPHAGEAL DILATION N/A 01/07/2013   BMW:[id number]:Schatzki ring was found at the gastroesophageal junction/SINGLE DUODENAL DIVERTICULUM    reports that she has never smoked. She has never used smokeless tobacco. She reports that she does not drink alcohol or use drugs. Social History   Social History  . Marital status: Married    Spouse name: N/A  . Number of children: N/A  . Years of education: N/A   Occupational History  . Not on file.   Social History Main Topics  . Smoking status: Never Smoker  . Smokeless tobacco: Never Used  . Alcohol use No  . Drug use: No  . Sexual activity: No   Other Topics Concern  . Not on file   Social History Narrative  . No narrative on file    Functional Status Survey:    Family History  Problem Relation Age of Onset  . Colon cancer Neg Hx     Health Maintenance  Topic Date Due  . TETANUS/TDAP  06/03/1953  . DEXA SCAN  06/04/1999  . PNA vac Low Risk Adult (2 of 2 - PCV13) 07/24/2017  . INFLUENZA VACCINE  Completed    No Known Allergies  Allergies as of 01/01/2017   No Known Allergies     Medication List       Accurate as of 01/01/17  10:42 AM. Always use your most recent med list.          amoxicillin-clavulanate 875-125 MG tablet Commonly known as:  AUGMENTIN Take 1 tablet by mouth 2 (two) times daily. Last dose 3/19   aspirin EC 81 MG tablet Take 81 mg by mouth daily.   CALCIUM 500/VITAMIN D PO Take 500 mg by mouth 2 (two) times daily.   citalopram 20 MG tablet Commonly known as:  CELEXA Take 20 mg by mouth daily.   ENSURE CLEAR PO Give ensure clear ( apple ) daily due to decreased appetite once a day   fluticasone 50 MCG/ACT nasal spray Commonly known as:   FLONASE Place 1 spray into both nostrils 2 (two) times daily as needed for rhinitis.   ipratropium-albuterol 0.5-2.5 (3) MG/3ML Soln Commonly known as:  DUONEB Take 3 mLs by nebulization every 6 (six) hours as needed (wheezing; shortness of breath).   lisinopril 20 MG tablet Commonly known as:  PRINIVIL,ZESTRIL Take 40 mg by mouth daily.   loratadine 10 MG tablet Commonly known as:  CLARITIN Take 10 mg by mouth daily.   metoprolol 50 MG tablet Commonly known as:  LOPRESSOR Take 50 mg by mouth 2 (two) times daily.   mirtazapine 15 MG tablet Commonly known as:  REMERON Take 15 mg by mouth at bedtime.   NAMENDA TITRATION PAK tablet pack Generic drug:  memantine Take 10 mg by mouth 2 (two) times daily.   omeprazole 40 MG capsule Commonly known as:  PRILOSEC Take 40 mg by mouth 2 (two) times daily.   ondansetron 4 MG tablet Commonly known as:  ZOFRAN Take 4 mg by mouth daily at 6 (six) AM. Before meals   ondansetron 4 MG tablet Commonly known as:  ZOFRAN Take 4 mg by mouth at bedtime.   potassium chloride SA 20 MEQ tablet Commonly known as:  K-DUR,KLOR-CON Take 20 mEq by mouth daily.       Review of Systems  Unable to perform ROS: Dementia    Vitals:   01/01/17 1040  BP: (!) 145/83  Pulse: (!) 56  Resp: 14  Temp: 98.4 F (36.9 C)  TempSrc: Oral  SpO2: 95%   There is no height or weight on file to calculate BMI. Physical Exam  Constitutional: She appears well-developed and well-nourished.  HENT:  Head: Normocephalic.  Mouth/Throat: Oropharynx is clear and moist.  Eyes: Pupils are equal, round, and reactive to light.  Neck: Neck supple.  Cardiovascular: Normal rate, regular rhythm and normal heart sounds.   No murmur heard. Pulmonary/Chest: Effort normal. No respiratory distress. She has rales.  Rales in Left lower lobe  Abdominal: Soft. Bowel sounds are normal. She exhibits no distension. There is no tenderness. There is no rebound.  Musculoskeletal:  She exhibits no edema.  Neurological: She is alert.  Not oriented. Moving all her extremities well.  Skin: Skin is warm and dry.  Has Excoriated Rash in her Perianal area with some skin breakdown and erythema.  Psychiatric: Her mood appears anxious. She exhibits a depressed mood.    Labs reviewed: Basic Metabolic Panel:  Recent Labs  16/10/96 0627  09/01/16 0700  11/22/16 0715 12/14/16 0730  12/26/16 0446 12/27/16 0430 12/30/16 0425  NA 137  < > 138  < > 140 140  < > 135 137 139  K 3.9  < > 4.1  < > 4.5 3.8  < > 3.6 3.2* 4.2  CL 101  < > 104  < > 102  105  < > 102 105 111  CO2 30  < > 28  < > 30 28  < > 23 25 20*  GLUCOSE 109*  < > 99  < > 93 87  < > 106* 67 127*  BUN 18  < > 14  < > 11 6  < > 12 13 15   CREATININE 0.63  < > 0.74  < > 0.81 0.69  < > 0.72 0.72 0.69  CALCIUM 8.5*  < > 8.8*  < > 8.9 8.7*  < > 8.0* 7.6* 7.9*  MG 2.0  --  1.9  --   --  1.7  --   --   --   --   PHOS  --   --   --   --  3.5 3.2  --   --   --   --   < > = values in this interval not displayed. Liver Function Tests:  Recent Labs  10/10/16 1455 11/22/16 0715 12/26/16 0446  AST 25 25 22   ALT 18 13* 13*  ALKPHOS 71 58 46  BILITOT 0.5 0.6 0.7  PROT 7.0 6.2* 5.5*  ALBUMIN 3.6 3.1* 2.9*    Recent Labs  10/10/16 1455  LIPASE 40  AMYLASE 60   No results for input(s): AMMONIA in the last 8760 hours. CBC:  Recent Labs  07/10/16 0640 10/10/16 1455  12/25/16 2316 12/26/16 0446 12/30/16 0425  WBC 8.4 8.3  < > 9.4 14.6* 9.7  NEUTROABS 5.7 6.4  --  8.1*  --   --   HGB 12.8 13.6  < > 12.7 11.2* 11.7*  HCT 38.6 41.7  < > 37.4 33.7* 35.0*  MCV 97.7 95.0  < > 93.5 94.7 93.6  PLT 338 254  < > 209 185 212  < > = values in this interval not displayed. Cardiac Enzymes: No results for input(s): CKTOTAL, CKMB, CKMBINDEX, TROPONINI in the last 8760 hours. BNP: Invalid input(s): POCBNP No results found for: HGBA1C Lab Results  Component Value Date   TSH 1.299 11/22/2016   No results found for:  VITAMINB12 No results found for: FOLATE No results found for: IRON, TIBC, FERRITIN  Imaging and Procedures obtained prior to SNF admission: No results found.  Assessment/Plan Pneumonia of left lower lobe  Today is her last day of Augmentin. Continue to monitor her Vitals. POX QD. Do not need follow up Xray right now.  Essential hypertension She is back on Lisinopril and Metoprolol.  Diarrhea Check stool for C.Diff  Esophageal dysmotility Continue PRN Zofran  Dementia without behavioral disturbance, Stable on Namenda Continue supportive care.   Skin rash Started on Diflucan 200 mg and Lotrisone cream. Keep pressure off her Bottom.  Low Albumin Nutrition supplement.   Family/ staff Communication:   Labs/tests ordered: Follow up CBC and CMP.  Total time spent in this patient care encounter was 45 _ minutes; greater than 50% of the visit spent  coordinating care for problems addressed at this encounter.

## 2017-01-02 ENCOUNTER — Encounter (HOSPITAL_COMMUNITY)
Admission: RE | Admit: 2017-01-02 | Discharge: 2017-01-02 | Disposition: A | Discharge status: Home / Self Care | Payer: No Typology Code available for payment source | Source: Skilled Nursing Facility | Attending: *Deleted | Admitting: *Deleted

## 2017-01-02 LAB — C DIFFICILE QUICK SCREEN W PCR REFLEX
C DIFFICILE (CDIFF) TOXIN: NEGATIVE
C Diff antigen: NEGATIVE
C Diff interpretation: NOT DETECTED

## 2017-01-05 ENCOUNTER — Encounter: Payer: Self-pay | Admitting: Internal Medicine

## 2017-01-05 ENCOUNTER — Non-Acute Institutional Stay (SKILLED_NURSING_FACILITY): Payer: Medicare Other | Admitting: Internal Medicine

## 2017-01-05 ENCOUNTER — Encounter (HOSPITAL_COMMUNITY)
Admission: RE | Admit: 2017-01-05 | Discharge: 2017-01-05 | Disposition: A | Discharge status: Home / Self Care | Payer: No Typology Code available for payment source | Source: Skilled Nursing Facility | Attending: *Deleted | Admitting: *Deleted

## 2017-01-05 DIAGNOSIS — J189 Pneumonia, unspecified organism: Secondary | ICD-10-CM

## 2017-01-05 DIAGNOSIS — R197 Diarrhea, unspecified: Secondary | ICD-10-CM

## 2017-01-05 DIAGNOSIS — J181 Lobar pneumonia, unspecified organism: Secondary | ICD-10-CM

## 2017-01-05 DIAGNOSIS — R001 Bradycardia, unspecified: Secondary | ICD-10-CM

## 2017-01-05 LAB — COMPREHENSIVE METABOLIC PANEL
ALBUMIN: 3.5 g/dL (ref 3.5–5.0)
ALK PHOS: 81 U/L (ref 38–126)
ALT: 19 U/L (ref 14–54)
ANION GAP: 10 (ref 5–15)
AST: 24 U/L (ref 15–41)
BILIRUBIN TOTAL: 0.2 mg/dL — AB (ref 0.3–1.2)
BUN: 19 mg/dL (ref 6–20)
CALCIUM: 9.3 mg/dL (ref 8.9–10.3)
CO2: 25 mmol/L (ref 22–32)
Chloride: 103 mmol/L (ref 101–111)
Creatinine, Ser: 0.75 mg/dL (ref 0.44–1.00)
GFR calc Af Amer: >60 mL/min (ref >60)
Glucose, Bld: 139 mg/dL — ABNORMAL HIGH (ref 65–99)
Potassium: 4 mmol/L (ref 3.5–5.1)
Sodium: 138 mmol/L (ref 135–145)
Total Protein: 7.8 g/dL (ref 6.5–8.1)

## 2017-01-05 LAB — CBC WITH DIFFERENTIAL/PLATELET
BASOS PCT: 0 %
Basophils Absolute: 0 10*3/uL (ref 0.0–0.1)
Eosinophils Absolute: 0.2 10*3/uL (ref 0.0–0.7)
Eosinophils Relative: 2 %
HEMATOCRIT: 38 % (ref 36.0–46.0)
Hemoglobin: 12.4 g/dL (ref 12.0–15.0)
LYMPHS ABS: 1.6 10*3/uL (ref 0.7–4.0)
LYMPHS PCT: 13 %
MCH: 31.4 pg (ref 26.0–34.0)
MCHC: 32.6 g/dL (ref 30.0–36.0)
MCV: 96.2 fL (ref 78.0–100.0)
Monocytes Absolute: 0.8 10*3/uL (ref 0.1–1.0)
Monocytes Relative: 7 %
Neutro Abs: 9.3 10*3/uL — ABNORMAL HIGH (ref 1.7–7.7)
Neutrophils Relative %: 78 %
Platelets: 449 10*3/uL — ABNORMAL HIGH (ref 150–400)
RBC: 3.95 MIL/uL (ref 3.87–5.11)
RDW: 14.3 % (ref 11.5–15.5)
WBC: 12 10*3/uL — ABNORMAL HIGH (ref 4.0–10.5)

## 2017-01-05 NOTE — Progress Notes (Signed)
Location:   [facility] Room Number: 115/W Place of Service:  SNF [phone]) Provider:  Vanice Sarah, MD  Patient Care Team: Kirstie Peri, MD as PCP - General (Internal Medicine) [place], MD as Attending Physician (Gastroenterology)  Extended Emergency Contact Information Primary Emergency Contact: [name] [place] Home Phone: [phone] Relation: Son Secondary Emergency Contact: [name] [place] Home Phone: [phone] Mobile Phone: [phone] Relation: Son  Code Status:  Full Code Goals of care: Advanced Directive information Advanced Directives 01/05/2017  Does Patient Have a Medical Advance Directive? Yes  Type of Advance Directive (No Data)  Does patient want to make changes to medical advance directive? No - Patient declined  Copy of Healthcare Power of Attorney in Chart? -  Pre-existing out of facility DNR order (yellow form or pink MOST form) -     Chief Complaint  Patient presents with  . Acute Visit    Diarrhea    HPI:  Pt is a 83 y.o. female seen today for an acute visit for Follow-up of diarrhea. She was recently hospitalized for left mid and lower lobe pneumonia and was treated aggressively with antibiotics including vancomycin and Zosyn and was discharged on Augmentin.  She has since completed the antibiotics.  She has had some diarrhea which has an element of chronicity.  Thought possibly aggravated by the Augmentin.  C. difficile cultures been obtained which was negative.  She continues to have diarrhea however.  She is not complaining of any abdominal discomfort in this regards or nausea or vomiting   Vital signs appear stable.  I do note that time she has low pulses in the 40s and 50s in fact I got around 50 on exam today and we did an EKG which was in the higher 40s at times.  She is on Lopressor for a history of hypertension recent blood pressures have been  130/62-110/60 Is also on lisinopril 40 mg a day in addition to the Lopressor 50 mg twice a day  Regards to pneumonia she still has a cough at times she does have nebulizer scheduled which apparently is  helping     Past Medical History:  Diagnosis Date  . Asthma   . Dementia   . Diabetes mellitus   . H. pylori infection MAR 2014  . Hypertension   . Stroke [facility])    Past Surgical History:  Procedure Laterality Date  . CHOLECYSTECTOMY    . COLECTOMY     secondary to diverticulitis  . ESOPHAGOGASTRODUODENOSCOPY (EGD) WITH ESOPHAGEAL DILATION N/A 01/07/2013   [id number] ring was found at the gastroesophageal junction/SINGLE DUODENAL DIVERTICULUM    No Known Allergies  Current Outpatient Prescriptions on File Prior to Visit  Medication Sig Dispense Refill  . aspirin EC 81 MG tablet Take 81 mg by mouth daily.    . Calcium Carbonate-Vitamin D (CALCIUM 500/VITAMIN D PO) Take 500 mg by mouth 2 (two) times daily.    . citalopram (CELEXA) 20 MG tablet Take 20 mg by mouth daily.     . fluticasone (FLONASE) 50 MCG/ACT nasal spray Place 1 spray into both nostrils 2 (two) times daily as needed for rhinitis.    Marland Kitchen ipratropium-albuterol (DUONEB) 0.5-2.5 (3) MG/3ML SOLN Take 3 mLs by nebulization every 6 (six) hours as needed (wheezing; shortness of breath).     Marland Kitchen lisinopril (PRINIVIL,ZESTRIL) 20 MG tablet Take 40 mg by mouth daily.     Marland Kitchen loratadine (CLARITIN) 10 MG  tablet Take 10 mg by mouth daily.    . metoprolol (LOPRESSOR) 50 MG tablet Take 50 mg by mouth 2 (two) times daily.    . mirtazapine (REMERON) 15 MG tablet Take 15 mg by mouth at bedtime.    . Nutritional Supplements (ENSURE CLEAR PO) Give ensure clear ( apple ) daily due to decreased appetite once a day    . omeprazole (PRILOSEC) 40 MG capsule Take 40 mg by mouth 2 (two) times daily.    . potassium chloride SA (K-DUR,KLOR-CON) 20 MEQ tablet Take 20 mEq by mouth daily.     No current facility-administered medications on file prior  to visit.      Review of Systems    limited secondary to dementia--she's history of present illness she is not really complaining of any chest pain shortness of breath syncope  Or  dizziness  Immunization History  Administered Date(s) Administered  . Influenza-Unspecified 07/20/2016  . Pneumococcal-Unspecified 07/24/2016   Pertinent  Health Maintenance Due  Topic Date Due  . DEXA SCAN  06/04/1999  . PNA vac Low Risk Adult (2 of 2 - PCV13) 07/24/2017  . INFLUENZA VACCINE  Completed   No flowsheet data found. Functional Status Survey:    Vitals:   01/05/17 1427  BP: 130/62  Pulse: (!) 56  Resp: (!) 22  Temp: 97 F (36.1 C)  TempSrc: Oral  SpO2: 96%   There is no height or weight on file to calculate BMI. Physical Exam   Constitutional: She appears well-developed and well-nourished. Sitting comfortably in her wheelchair HENT:  Head: Normocephalic.  Mouth/Throat: Oropharynx is clear and moist.  Eyes: Pupils are equal, round, and reactive to light.  Neck: Neck supple.  Cardiovascular: Bradycardic I got a rate of 48 on exam later after a short coughing episode I noted it was up to high 50s No murmur heard. Pulmonary/Chest: Effort normal. No respiratory distress. Could not really appreciate overt congestion with deep inspiration however she didcough at times which sometimes limited the exam Abdominal: Soft. Bowel sounds are normal. She exhibits no distension. There is no tenderness. There is no rebound.  Musculoskeletal: She exhibits no edema.  Neurological: She is alert.  Not oriented. Moving all her extremities well.  Skin: Skin is warm and dry.    Psychiatric: Has baseline confusion was pleasant and appropriate follow simple verbal commands  Labs reviewed:  Recent Labs  06/26/16 0627  09/01/16 0700  11/22/16 0715 12/14/16 0730  12/26/16 0446 12/27/16 0430 12/30/16 0425  NA 137  < > 138  < > 140 140  < > 135 137 139  K 3.9  < > 4.1  < > 4.5 3.8  < > 3.6  3.2* 4.2  CL 101  < > 104  < > 102 105  < > 102 105 111  CO2 30  < > 28  < > 30 28  < > 23 25 20*  GLUCOSE 109*  < > 99  < > 93 87  < > 106* 67 127*  BUN 18  < > 14  < > 11 6  < > 12 13 15   CREATININE 0.63  < > 0.74  < > 0.81 0.69  < > 0.72 0.72 0.69  CALCIUM 8.5*  < > 8.8*  < > 8.9 8.7*  < > 8.0* 7.6* 7.9*  MG 2.0  --  1.9  --   --  1.7  --   --   --   --  PHOS  --   --   --   --  3.5 3.2  --   --   --   --   < > = values in this interval not displayed.  Recent Labs  10/10/16 1455 11/22/16 0715 12/26/16 0446  AST 25 25 22   ALT 18 13* 13*  ALKPHOS 71 58 46  BILITOT 0.5 0.6 0.7  PROT 7.0 6.2* 5.5*  ALBUMIN 3.6 3.1* 2.9*    Recent Labs  07/10/16 0640 10/10/16 1455  12/25/16 2316 12/26/16 0446 12/30/16 0425  WBC 8.4 8.3  < > 9.4 14.6* 9.7  NEUTROABS 5.7 6.4  --  8.1*  --   --   HGB 12.8 13.6  < > 12.7 11.2* 11.7*  HCT 38.6 41.7  < > 37.4 33.7* 35.0*  MCV 97.7 95.0  < > 93.5 94.7 93.6  PLT 338 254  < > 209 185 212  < > = values in this interval not displayed. Lab Results  Component Value Date   TSH 1.299 11/22/2016   No results found for: HGBA1C No results found for: CHOL, HDL, LDLCALC, LDLDIRECT, TRIG, CHOLHDL  Significant Diagnostic Results in last 30 days:  Dg Chest Portable 1 View  Result Date: 12/25/2016 CLINICAL DATA:  83 year old female with fever and cough. EXAM: PORTABLE CHEST 1 VIEW COMPARISON:  Chest radiograph dated 04/06/2012 FINDINGS: There is background of emphysema. Patchy area of hazy density in the left mid to lower lung field is concerning for developing infiltrate. The right lung is clear. There is no significant pleural effusion. No pneumothorax. Stable top-normal cardiac silhouette. Osteopenia with old healed left humeral neck fracture. No acute fracture. IMPRESSION: Hazy density in the left mid to lower lung field concerning for pneumonia. Clinical correlation and follow-up resolution recommended. Electronically Signed   By: Elgie Collard M.D.    On: 12/25/2016 23:11    Assessment/Plan   #1 diarrhea-C. difficile has returned negative we'll start Imodium when necessary up to 4 times a day loose bowel movements and continue to monitor.  Also will update a metabolic panel to keep an eye on her electrolytes with history of diarrhea last potassium was within normal range at 4.2 back on March 17-she is on potassium supplement patient had mildly low potassium of 3.2--9 days ago and supplementation was started.  #2 bradycardia suspect this is due to her beta blocker will obtain an EKG however to rule out any acute etiology she is relatively asymptomatic.  Will write an order reduce her Lopressor to 25 mg twice a day and hold for any pulse less than 60.  If   blood pressure is elevated consider adding another agent possibly Norvasc.  Addendum.  We have obtain results of EKG which is of somewhat sketchy quality however appears to be sinus bradycardia with a regular rhythm-appears comparable with previous EKG done back in 2013.  At this point will continue to monitor vital signs pulse ox every shift for now.  #3 history of pneumonia-she has completed antibiotic continues on nebulizers routine and as well as as needed this appears to be gradually improving updated chest x-ray has been ordered for follow-up of the pneumonia.      Of note we have received the results of her metabolic panel and this is reassuring her potassium is normal at 4.0 sodium is 138 BUN is 19 creatinine 0.75  Liver function tests are largely unremarkable bilirubin is minimally depressed at 0.2.  We also receive results of the CBC with  differential and actually white count is  elevated at 12.0-absolute neutrophils are elevated 9.3.  Again will update chest x-ray results here-also will update a CBC with differential tomorrow if it is continually elevated consider obtaining a UA C&S as well as C. difficile culture although again previous C. difficile culture was  negative-continue to monitor for any fever or chills  [id number]-of note greater than 40 minutes spent assessing patient-discussing her status with nursing staff-reviewing her chart-her labs-and diagnostic studies-and coordinating and formulating a plan of care for numerous diagnoses-of note greater than 50% of time spent coordinating plan of care

## 2017-01-06 ENCOUNTER — Ambulatory Visit (HOSPITAL_COMMUNITY): Payer: Medicare Other | Attending: Internal Medicine

## 2017-01-06 ENCOUNTER — Encounter (HOSPITAL_COMMUNITY)
Admission: RE | Admit: 2017-01-06 | Discharge: 2017-01-06 | Disposition: A | Discharge status: Home / Self Care | Payer: No Typology Code available for payment source

## 2017-01-06 DIAGNOSIS — M8580 Other specified disorders of bone density and structure, unspecified site: Secondary | ICD-10-CM | POA: Insufficient documentation

## 2017-01-06 DIAGNOSIS — J439 Emphysema, unspecified: Secondary | ICD-10-CM | POA: Insufficient documentation

## 2017-01-06 DIAGNOSIS — Z09 Encounter for follow-up examination after completed treatment for conditions other than malignant neoplasm: Secondary | ICD-10-CM | POA: Diagnosis not present

## 2017-01-06 DIAGNOSIS — Z8701 Personal history of pneumonia (recurrent): Secondary | ICD-10-CM | POA: Diagnosis not present

## 2017-01-06 DIAGNOSIS — I700 Atherosclerosis of aorta: Secondary | ICD-10-CM | POA: Insufficient documentation

## 2017-01-06 LAB — BASIC METABOLIC PANEL
Anion gap: 7 (ref 5–15)
BUN: 16 mg/dL (ref 6–20)
CO2: 25 mmol/L (ref 22–32)
CREATININE: 0.71 mg/dL (ref 0.44–1.00)
Calcium: 8.6 mg/dL — ABNORMAL LOW (ref 8.9–10.3)
Chloride: 109 mmol/L (ref 101–111)
Glucose, Bld: 96 mg/dL (ref 65–99)
Potassium: 4.2 mmol/L (ref 3.5–5.1)
SODIUM: 141 mmol/L (ref 135–145)

## 2017-01-06 LAB — CBC WITH DIFFERENTIAL/PLATELET
BASOS PCT: 0 %
Basophils Absolute: 0 10*3/uL (ref 0.0–0.1)
EOS ABS: 0.3 10*3/uL (ref 0.0–0.7)
Eosinophils Relative: 4 %
HCT: 33.5 % — ABNORMAL LOW (ref 36.0–46.0)
Hemoglobin: 11 g/dL — ABNORMAL LOW (ref 12.0–15.0)
Lymphocytes Relative: 20 %
Lymphs Abs: 1.5 10*3/uL (ref 0.7–4.0)
MCH: 31.1 pg (ref 26.0–34.0)
MCHC: 32.8 g/dL (ref 30.0–36.0)
MCV: 94.6 fL (ref 78.0–100.0)
MONO ABS: 0.7 10*3/uL (ref 0.1–1.0)
MONOS PCT: 10 %
NEUTROS PCT: 66 %
Neutro Abs: 5.1 10*3/uL (ref 1.7–7.7)
Platelets: 370 10*3/uL (ref 150–400)
RBC: 3.54 MIL/uL — ABNORMAL LOW (ref 3.87–5.11)
RDW: 14.1 % (ref 11.5–15.5)
WBC: 7.7 10*3/uL (ref 4.0–10.5)

## 2017-01-08 ENCOUNTER — Encounter (HOSPITAL_COMMUNITY)
Admission: RE | Admit: 2017-01-08 | Discharge: 2017-01-08 | Disposition: A | Discharge status: Home / Self Care | Payer: Medicare Other | Source: Skilled Nursing Facility | Attending: Internal Medicine | Admitting: Internal Medicine

## 2017-01-08 DIAGNOSIS — R7989 Other specified abnormal findings of blood chemistry: Secondary | ICD-10-CM | POA: Insufficient documentation

## 2017-01-08 DIAGNOSIS — J9611 Chronic respiratory failure with hypoxia: Secondary | ICD-10-CM | POA: Insufficient documentation

## 2017-01-08 DIAGNOSIS — R6889 Other general symptoms and signs: Secondary | ICD-10-CM | POA: Insufficient documentation

## 2017-01-08 LAB — CBC WITH DIFFERENTIAL/PLATELET
BASOS ABS: 0.1 10*3/uL (ref 0.0–0.1)
Basophils Relative: 1 %
EOS PCT: 3 %
Eosinophils Absolute: 0.2 10*3/uL (ref 0.0–0.7)
HEMATOCRIT: 34 % — AB (ref 36.0–46.0)
Hemoglobin: 11 g/dL — ABNORMAL LOW (ref 12.0–15.0)
LYMPHS ABS: 1.1 10*3/uL (ref 0.7–4.0)
LYMPHS PCT: 17 %
MCH: 31.2 pg (ref 26.0–34.0)
MCHC: 32.4 g/dL (ref 30.0–36.0)
MCV: 96.3 fL (ref 78.0–100.0)
MONO ABS: 0.5 10*3/uL (ref 0.1–1.0)
Monocytes Relative: 8 %
NEUTROS ABS: 4.6 10*3/uL (ref 1.7–7.7)
Neutrophils Relative %: 71 %
PLATELETS: 340 10*3/uL (ref 150–400)
RBC: 3.53 MIL/uL — AB (ref 3.87–5.11)
RDW: 14.4 % (ref 11.5–15.5)
WBC: 6.5 10*3/uL (ref 4.0–10.5)

## 2017-01-08 LAB — COMPREHENSIVE METABOLIC PANEL
ALK PHOS: 66 U/L (ref 38–126)
ALT: 14 U/L (ref 14–54)
AST: 20 U/L (ref 15–41)
Albumin: 2.9 g/dL — ABNORMAL LOW (ref 3.5–5.0)
Anion gap: 8 (ref 5–15)
BILIRUBIN TOTAL: 0.5 mg/dL (ref 0.3–1.2)
BUN: 16 mg/dL (ref 6–20)
CALCIUM: 9.2 mg/dL (ref 8.9–10.3)
CHLORIDE: 103 mmol/L (ref 101–111)
CO2: 26 mmol/L (ref 22–32)
CREATININE: 0.73 mg/dL (ref 0.44–1.00)
Glucose, Bld: 134 mg/dL — ABNORMAL HIGH (ref 65–99)
Potassium: 4.7 mmol/L (ref 3.5–5.1)
Sodium: 137 mmol/L (ref 135–145)
TOTAL PROTEIN: 6.2 g/dL — AB (ref 6.5–8.1)

## 2017-01-19 ENCOUNTER — Non-Acute Institutional Stay (SKILLED_NURSING_FACILITY): Payer: Medicare Other | Admitting: Internal Medicine

## 2017-01-19 ENCOUNTER — Encounter: Payer: Self-pay | Admitting: Internal Medicine

## 2017-01-19 DIAGNOSIS — I10 Essential (primary) hypertension: Secondary | ICD-10-CM | POA: Diagnosis not present

## 2017-01-19 DIAGNOSIS — R001 Bradycardia, unspecified: Secondary | ICD-10-CM

## 2017-01-19 DIAGNOSIS — E876 Hypokalemia: Secondary | ICD-10-CM

## 2017-01-19 DIAGNOSIS — J181 Lobar pneumonia, unspecified organism: Secondary | ICD-10-CM

## 2017-01-19 DIAGNOSIS — M810 Age-related osteoporosis without current pathological fracture: Secondary | ICD-10-CM | POA: Diagnosis not present

## 2017-01-19 DIAGNOSIS — K224 Dyskinesia of esophagus: Secondary | ICD-10-CM | POA: Diagnosis not present

## 2017-01-19 DIAGNOSIS — E1165 Type 2 diabetes mellitus with hyperglycemia: Secondary | ICD-10-CM

## 2017-01-19 DIAGNOSIS — J189 Pneumonia, unspecified organism: Secondary | ICD-10-CM

## 2017-01-19 NOTE — Progress Notes (Signed)
Location:   [facility] Room Number: 115/W Place of Service:  SNF (31) Provider:  Vanice Sarah, MD  Patient Care Team: Kirstie Peri, MD as PCP - General (Internal Medicine) [place], MD as Attending Physician (Gastroenterology)  Extended Emergency Contact Information Primary Emergency Contact: [name] [place] Home Phone: [phone] Relation: Son Secondary Emergency Contact: [name] [place] Home Phone: [phone] Mobile Phone: [phone] Relation: Son  Code Status:  Full Code Goals of care: Advanced Directive information Advanced Directives 01/19/2017  Does Patient Have a Medical Advance Directive? Yes  Type of Advance Directive (No Data)  Does patient want to make changes to medical advance directive? No - Patient declined  Copy of Healthcare Power of Attorney in Chart? -  Pre-existing out of facility DNR order (yellow form or pink MOST form) -     Chief Complaint  Patient presents with  . Medical Management of Chronic Issues    Routine Visit  For medical management of chronic medical issues including dementia-hypertension-diabetes-depression-history of pneumonia and URI  HPI:  Pt is a 83 y.o. female seen today for medical management of chronic diseases.  As noted above.  She was recently readmitted here after a hospitalization for pneumonia.  She had fever  respiratory distress and change in mental status as well as hypotension.  She was treated with l vancomycin and Zosyn and discharged on Augmentin which she has completed.  She's had some diarrhea recently which apparently has improved she has been checked for C. difficile and cultures were negative.  She is not complaining of any abdominal discomfort breathing difficulties or cough she appears to be back to her baseline.  In regards to her other medical issues this appears stable she does have a history of dementia continues  on Namenda appears to be doing well with supportive care her weight appears to be stable at 114 pounds.  She does have a history of hypertension she is on lisinopril as well as Lopressor Lopressor was recently decreased secondary to frequent bradycardic readings-EKG did show sinus bradycardia.  Her pulse is still continued be somewhat low  on exam today I see occasional E readings in the high 40s although these are not real common recent blood pressures appear stable 105/51-130/69 137/69-114/56.  Currently she is sitting in her wheelchair comfortably is no complaints vital signs stable again she does have some bradycardia.     Past Medical History:  Diagnosis Date  . Asthma   . Dementia   . Diabetes mellitus   . H. pylori infection MAR 2014  . Hypertension   . Stroke [facility])    Past Surgical History:  Procedure Laterality Date  . CHOLECYSTECTOMY    . COLECTOMY     secondary to diverticulitis  . ESOPHAGOGASTRODUODENOSCOPY (EGD) WITH ESOPHAGEAL DILATION N/A 01/07/2013   [id number] ring was found at the gastroesophageal junction/SINGLE DUODENAL DIVERTICULUM    No Known Allergies  Current Outpatient Prescriptions on File Prior to Visit  Medication Sig Dispense Refill  . aspirin EC 81 MG tablet Take 81 mg by mouth daily.    [name] [place]-Castor Oil (VENELEX) OINT Apply to sacrum and bilateral buttocks q shift & prn every shift    . Calcium Carbonate-Vitamin D (CALCIUM 500/VITAMIN D PO) Take 500 mg by mouth 2 (two) times daily.    . citalopram (CELEXA) 20 MG tablet Take 20 mg by mouth daily.     . clotrimazole-betamethasone (LOTRISONE) cream Apply to  affected area of bottom every shift    . fluticasone (FLONASE) 50 MCG/ACT nasal spray Place 1 spray into both nostrils 2 (two) times daily as needed for rhinitis.    Marland Kitchen ipratropium-albuterol (DUONEB) 0.5-2.5 (3) MG/3ML SOLN Take 3 mLs by nebulization every 6 (six) hours as needed (wheezing; shortness of breath).     Marland Kitchen lisinopril  (PRINIVIL,ZESTRIL) 20 MG tablet Take 40 mg by mouth daily.     Marland Kitchen loratadine (CLARITIN) 10 MG tablet Take 10 mg by mouth daily.    . memantine (NAMENDA) 10 MG tablet Take 10 mg by mouth 2 (two) times daily.    . metoprolol (LOPRESSOR) 50 MG tablet Take 50 mg by mouth 2 (two) times daily.    . mirtazapine (REMERON) 15 MG tablet Take 15 mg by mouth at bedtime.    . Nutritional Supplements (ENSURE CLEAR PO) Give ensure clear ( apple ) daily due to decreased appetite once a day    . omeprazole (PRILOSEC) 40 MG capsule Take 40 mg by mouth 2 (two) times daily.    . ondansetron (ZOFRAN) 4 MG tablet Give 4 mg by mouth four times a day    . potassium chloride SA (K-DUR,KLOR-CON) 20 MEQ tablet Take 20 mEq by mouth daily.     No current facility-administered medications on file prior to visit.      Review of Systems   This is limited secondary to dementia please see history of present illness  Immunization History  Administered Date(s) Administered  . Influenza-Unspecified 07/20/2016  . Pneumococcal-Unspecified 07/24/2016   Pertinent  Health Maintenance Due  Topic Date Due  . DEXA SCAN  06/04/1999  . INFLUENZA VACCINE  05/16/2017  . PNA vac Low Risk Adult (2 of 2 - PCV13) 07/24/2017   No flowsheet data found. Functional Status Survey:    Vitals:   01/19/17 1036  BP: 135/69  Pulse: (!) 49  Resp: 18  Temp: 97.5 F (36.4 C)  TempSrc: Oral  Pulse on exam today was 54 Weight is stable at 114.2 pounds Physical Exam  In general this is a somewhat frail elderly female in no distress sitting comfortably in her wheelchair.  Her skin is warm and dry.  Eyes pupils appear reactive light visual acuity appears grossly intact sclera and conjunctiva are clear.  Oropharynx is clear mucous membranes moist.  Chest is clear to auscultation with somewhat shallow air entry at could not really appreciate any congestion there is no labored breathing.  Heart is regular rhythm with slightly  bradycardic rate of 54 she does not have significant lower extremity edema I could not appreciate any murmur gallop or rub.  Her abdomen is soft nontender with positive bowel sounds.  Muscle skeletal does ambulate in a wheelchair most her extremities at baseline her strength appears to be strong bilaterally she does have diffuse arthritic changes of her extremities.  Neurologic is grossly intact I could not really appreciate lateralizing findings her speech is clear.  Psych she is oriented to self does follow simple verbal commands.     Labs reviewed:  Recent Labs  06/26/16 0627  09/01/16 0700  11/22/16 0715 12/14/16 0730  01/05/17 1600 01/06/17 0750 01/08/17 0700  NA 137  < > 138  < > 140 140  < > 138 141 137  K 3.9  < > 4.1  < > 4.5 3.8  < > 4.0 4.2 4.7  CL 101  < > 104  < > 102 105  < > 103 109  103  CO2 30  < > 28  < > 30 28  < > GLUCOSE 109*  < > 99  < > 93 87  < > 139* 96 134*  BUN 18  < > 14  < > 11 6  < > CREATININE 0.63  < > 0.74  < > 0.81 0.69  < > 0.75 0.71 0.73  CALCIUM 8.5*  < > 8.8*  < > 8.9 8.7*  < > 9.3 8.6* 9.2  MG 2.0  --  1.9  --   --  1.7  --   --   --   --   PHOS  --   --   --   --  3.5 3.2  --   --   --   --   < > = values in this interval not displayed.  Recent Labs  12/26/16 0446 01/05/17 1600 01/08/17 0700  AST ALT 13* 19 14  ALKPHOS 46 81 66  BILITOT 0.7 0.2* 0.5  PROT 5.5* 7.8 6.2*  ALBUMIN 2.9* 3.5 2.9*    Recent Labs  01/05/17 1600 01/06/17 0750 01/08/17 0700  WBC 12.0* 7.7 6.5  NEUTROABS 9.3* 5.1 4.6  HGB 12.4 11.0* 11.0*  HCT 38.0 33.5* 34.0*  MCV 96.2 94.6 96.3  PLT 449* 370 340   Lab Results  Component Value Date   TSH 1.299 11/22/2016   No results found for: HGBA1C No results found for: CHOL, HDL, LDLCALC, LDLDIRECT, TRIG, CHOLHDL  Significant Diagnostic Results in last 30 days:  Dg Chest 2 View  Result Date: 01/06/2017 CLINICAL DATA:  83 year old female with a history of follow-up  pneumonia EXAM: CHEST  2 VIEW COMPARISON:  04/06/2012, 12/25/2016 FINDINGS: Cardiomediastinal silhouette unchanged. Calcifications of the aortic arch. Diffusely coarsened interstitial markings present on chest x-ray from 2013 and 12/25/2016. No pneumothorax.  No large pleural effusion. Stigmata of emphysema, with increased retrosternal airspace, flattened hemidiaphragms, increased AP diameter, and hyperinflation on the AP view. No confluent airspace disease, with improved aeration on the left. Osteopenia. No displaced fracture. Degenerative changes of thoracic spine. IMPRESSION: Chronic lung changes and changes of emphysema with improving left-sided aeration compatible with resolving airspace disease. Aortic atherosclerosis. Osteopenia. Electronically Signed   By: Gilmer Mor D.O.   On: 01/06/2017 13:59   Dg Chest Portable 1 View  Result Date: 12/25/2016 CLINICAL DATA:  83 year old female with fever and cough. EXAM: PORTABLE CHEST 1 VIEW COMPARISON:  Chest radiograph dated 04/06/2012 FINDINGS: There is background of emphysema. Patchy area of hazy density in the left mid to lower lung field is concerning for developing infiltrate. The right lung is clear. There is no significant pleural effusion. No pneumothorax. Stable top-normal cardiac silhouette. Osteopenia with old healed left humeral neck fracture. No acute fracture. IMPRESSION: Hazy density in the left mid to lower lung field concerning for pneumonia. Clinical correlation and follow-up resolution recommended. Electronically Signed   By: Elgie Collard M.D.   On: 12/25/2016 23:11    Assessment/Plan  1 history of dementia this appears stable she does not really have any significant behaviors she is on Namenda weight appears to be stable at this point will monitor and continue supportive care. She is on Remeron as well for appetite stimulation  #2 hypertension this appears stable as noted above she is on lisinopril 40 mg daily Lopressor 25 mg  twice a day-Lopressor dose was recently decreased apparently 2 weeks ago because  of bradycardic readings however she still has some bradycardia we'll reduce it further down to-12.5 mg twice a day and hold Lopressor for any pulse less than 60-will order blood pressure and pulses twice a day for follow-up.  So far her blood pressures do not appear to be significantly elevated.  #3 history of pneumonia URI this appears to have stabilized status post antibiotic treatment she has had some diarrhea which apparently has improved at this point will monitor.--She does duo nebs when necessary i every 6 hours when necessary  #4 history of esophageal dysmotility-she continues on Zofran 4 times a day and this appears to have helped.  She also has Prilosec twice a day.  #5 history of allergic rhinitis she is on routine Claritin also has Flonase as needed this appears to be stable.  #6 history depression she is on Celexa this point will monitor continues to be pleasant and confused which is her baseline nursing does not report any acute issues in this regard.  #7 history of hypokalemia in the past she is on potassium supplementation this appears to be stable most recent potassium on March 26 was 4.7--will recheck this next lab day  #8 history of low albumin albumin was 2.9 on lab done on 01/08/2017--at this point continue supplements. Currently on Ensure twice a day which was started on March 23 Her weight again appears to be stable.--She continues on Remeron as well for appetite stimulation.    #9 history of osteoporosis she is on calcium with vitamin D--she has a previous history of humerus fracture back in 2014--as well as a hip fracture back in 2013  #10 history diabetes type 2 this is well controlled with blood sugars lately in the 80s to 90 range she certainly does not need to be on an oral agent  [id number]-of note greater than 35 minutes spent assessing patient-reviewing her chart-reviewing her  labs-and coordinating and formulating a plan of care for numerous diagnoses-of note greater than 50% of time spent coordinating plan of care

## 2017-01-22 ENCOUNTER — Encounter (HOSPITAL_COMMUNITY)
Admission: RE | Admit: 2017-01-22 | Discharge: 2017-01-22 | Disposition: A | Discharge status: Home / Self Care | Payer: Medicare Other | Source: Skilled Nursing Facility | Attending: Internal Medicine | Admitting: Internal Medicine

## 2017-01-22 DIAGNOSIS — I10 Essential (primary) hypertension: Secondary | ICD-10-CM | POA: Insufficient documentation

## 2017-01-22 DIAGNOSIS — Z5189 Encounter for other specified aftercare: Secondary | ICD-10-CM | POA: Insufficient documentation

## 2017-01-22 DIAGNOSIS — N390 Urinary tract infection, site not specified: Secondary | ICD-10-CM | POA: Insufficient documentation

## 2017-01-22 DIAGNOSIS — F0390 Unspecified dementia without behavioral disturbance: Secondary | ICD-10-CM | POA: Insufficient documentation

## 2017-01-22 DIAGNOSIS — M810 Age-related osteoporosis without current pathological fracture: Secondary | ICD-10-CM | POA: Insufficient documentation

## 2017-01-22 LAB — BASIC METABOLIC PANEL WITH GFR
Anion gap: 6 (ref 5–15)
BUN: 14 mg/dL (ref 6–20)
CO2: 29 mmol/L (ref 22–32)
Calcium: 8.8 mg/dL — ABNORMAL LOW (ref 8.9–10.3)
Chloride: 104 mmol/L (ref 101–111)
Creatinine, Ser: 0.7 mg/dL (ref 0.44–1.00)
GFR calc Af Amer: >60 mL/min
GFR calc non Af Amer: >60 mL/min
Glucose, Bld: 93 mg/dL (ref 65–99)
Potassium: 4.2 mmol/L (ref 3.5–5.1)
Sodium: 139 mmol/L (ref 135–145)

## 2017-01-23 ENCOUNTER — Encounter (HOSPITAL_COMMUNITY)
Admission: RE | Admit: 2017-01-23 | Discharge: 2017-01-23 | Disposition: A | Discharge status: Home / Self Care | Payer: Medicare Other | Source: Skilled Nursing Facility | Attending: Internal Medicine | Admitting: Internal Medicine

## 2017-01-23 DIAGNOSIS — F0390 Unspecified dementia without behavioral disturbance: Secondary | ICD-10-CM | POA: Insufficient documentation

## 2017-01-23 DIAGNOSIS — N390 Urinary tract infection, site not specified: Secondary | ICD-10-CM | POA: Insufficient documentation

## 2017-01-23 DIAGNOSIS — M810 Age-related osteoporosis without current pathological fracture: Secondary | ICD-10-CM | POA: Insufficient documentation

## 2017-01-23 DIAGNOSIS — Z5189 Encounter for other specified aftercare: Secondary | ICD-10-CM | POA: Insufficient documentation

## 2017-02-05 ENCOUNTER — Non-Acute Institutional Stay (SKILLED_NURSING_FACILITY): Payer: Medicare Other | Admitting: Internal Medicine

## 2017-02-05 ENCOUNTER — Encounter: Payer: Self-pay | Admitting: Internal Medicine

## 2017-02-05 DIAGNOSIS — R001 Bradycardia, unspecified: Secondary | ICD-10-CM

## 2017-02-05 DIAGNOSIS — I10 Essential (primary) hypertension: Secondary | ICD-10-CM | POA: Diagnosis not present

## 2017-02-05 NOTE — Progress Notes (Signed)
Location:   [facility] Room Number: 115/W Place of Service:  SNF [phone]) Provider:  Edmon Crape  [name], MD  Patient Care Team: [name], MD as PCP - General (Internal Medicine) Roena Malady, PA-C as Physician Assistant (Internal Medicine)  Extended Emergency Contact Information Primary Emergency Contact: [name] [place] Home Phone: [phone] Relation: Son Secondary Emergency Contact: [name] [place] Home Phone: [phone] Mobile Phone: [phone] Relation: Son  Code Status:  Full Code Goals of care: Advanced Directive information Advanced Directives 02/05/2017  Does Patient Have a Medical Advance Directive? Yes  Type of Advance Directive (No Data)  Does patient want to make changes to medical advance directive? No - Patient declined  Copy of Healthcare Power of Attorney in Chart? -  Pre-existing out of facility DNR order (yellow form or pink MOST form) -     Chief Complaint  Patient presents with  . Acute Visit    Bradycardia     HPI:  Pt is a 83 y.o. female seen today for an acute visit for Follow-up of bradycardia.  Patient has had somewhat persistent bradycardia here for an extended period and we have gradually titrated down her dose of Lopressor-she is on this for hypertension-nonetheless her pulses continue to be somewhat low with orders to hold for pulse less than 60 and more often than not it appears the Lopressor was held.  Currently she is only on 12.5 mg a Lopressor twice a day and recent pulses have been in the 40s to occasionally lower 60s again majority of the time under 60.  She is asymptomatic appears to be doing well clinically relatively speaking.  I did do a manual blood pressure today and got 136/60-previous readings range from 130/65 up to 179/79 although again the higher readings did not appear to be consistent.  Currently she is sitting in her wheelchair  comfortably has no complaints she is a poor historian secondary to dementia but is not complaining of any shortness of breath cough or chest pain.  Regards to blood pressure she is also on lisinopril 40 mg a day     Past Medical History:  Diagnosis Date  . Asthma   . Dementia   . Diabetes mellitus   . H. pylori infection MAR 2014  . Hypertension   . Stroke [place])    Past Surgical History:  Procedure Laterality Date  . CHOLECYSTECTOMY    . COLECTOMY     secondary to diverticulitis  . ESOPHAGOGASTRODUODENOSCOPY (EGD) WITH ESOPHAGEAL DILATION N/A 01/07/2013   [id number] ring was found at the gastroesophageal junction/SINGLE DUODENAL DIVERTICULUM    No Known Allergies  Outpatient Encounter Prescriptions as of 02/05/2017  Medication Sig  . aspirin EC 81 MG tablet Take 81 mg by mouth daily.  [name] [place]-Castor Oil (VENELEX) OINT Apply to sacrum and bilateral buttocks q shift & prn every shift  . Calcium Carbonate-Vitamin D (CALCIUM 500/VITAMIN D PO) Take 500 mg by mouth 2 (two) times daily.  . citalopram (CELEXA) 20 MG tablet Take 20 mg by mouth daily.   . clotrimazole-betamethasone (LOTRISONE) cream Apply to affected area of bottom every shift  . fluticasone (FLONASE) 50 MCG/ACT nasal spray Place 1 spray into both nostrils 2 (two) times daily as needed for rhinitis.  Marland Kitchen ipratropium-albuterol (DUONEB) 0.5-2.5 (3) MG/3ML SOLN Take 3 mLs by nebulization every 6 (six) hours as needed (wheezing; shortness of breath).   Marland Kitchen lisinopril (PRINIVIL,ZESTRIL) 20 MG  tablet Take 40 mg by mouth daily.   Marland Kitchen loratadine (CLARITIN) 10 MG tablet Take 10 mg by mouth daily.  . memantine (NAMENDA) 10 MG tablet Take 10 mg by mouth 2 (two) times daily.  . metoprolol (LOPRESSOR) 50 MG tablet Take 12.5 mg by mouth 2 (two) times daily.   . mirtazapine (REMERON) 15 MG tablet Take 15 mg by mouth at bedtime.  . Nutritional Supplements (ENSURE CLEAR PO) Give ensure clear ( apple ) daily due to decreased appetite  once a day  . omeprazole (PRILOSEC) 40 MG capsule Take 40 mg by mouth 2 (two) times daily.  . ondansetron (ZOFRAN) 4 MG tablet Give 4 mg by mouth four times a day  . potassium chloride SA (K-DUR,KLOR-CON) 20 MEQ tablet Take 20 mEq by mouth daily.   No facility-administered encounter medications on file as of 02/05/2017.     Review of Systems   This is limited secondary to dementia but nursing does not report any recent acute issues she is denying any chest pain shortness of breath increased cough or congestion does occasionally have respiratory issues pneumonia which have required hospitalization but this appears to have stabilized for now  She does not complaining of any chest pain or syncope type symptoms  Immunization History  Administered Date(s) Administered  . Influenza-Unspecified 07/20/2016  . Pneumococcal-Unspecified 07/24/2016   Pertinent  Health Maintenance Due  Topic Date Due  . HEMOGLOBIN A1C  1932-03-01  . FOOT EXAM  06/03/1944  . OPHTHALMOLOGY EXAM  06/03/1944  . URINE MICROALBUMIN  06/03/1944  . DEXA SCAN  06/04/1999  . INFLUENZA VACCINE  05/16/2017  . PNA vac Low Risk Adult (2 of 2 - PCV13) 07/24/2017   No flowsheet data found. Functional Status Survey:    Vitals:   02/05/17 1509  BP: 130/69  Pulse: (!) 49  Resp: 18  Temp: 97.9 F (36.6 C)  TempSrc: Oral  SpO2: 98%    Physical Exam In general this is a somewhat frail elderly female in no distress sitting comfortably in her wheelchair.  Her skin is warm and dry.  Eyes pupils appear reactive light visual acuity appears grossly intact sclera and conjunctiva are clear.  Oropharynx is clear mucous membranes moist.  Chest is clear to auscultation with somewhat shallow air entry at could not really appreciate any congestion there is no labored breathing.  Heart is regular rhythm with a pulse of 50 she does not have significant lower extremity edema I could not appreciate any murmur gallop or  rub.  Her abdomen is soft nontender with positive bowel sounds.  Muscle skeletal does ambulate in a wheelchair movest her extremities at baseline her strength appears to be strong bilaterally she does have diffuse arthritic changes of her extremities.  Neurologic is grossly intact I could not really appreciate lateralizing findings her speech is clear.  Psych she is oriented to self does follow simple verbal commands.  Labs reviewed:  Recent Labs  06/26/16 0627  09/01/16 0700  11/22/16 0715 12/14/16 0730  01/06/17 0750 01/08/17 0700 01/22/17 0730  NA 137  < > 138  < > 140 140  < > 141 137 139  K 3.9  < > 4.1  < > 4.5 3.8  < > 4.2 4.7 4.2  CL 101  < > 104  < > 102 105  < > 109 103 104  CO2 30  < > 28  < > 30 28  < > GLUCOSE 109*  < >  99  < > 93 87  < > 96 134* 93  BUN 18  < > 14  < > 11 6  < > CREATININE 0.63  < > 0.74  < > 0.81 0.69  < > 0.71 0.73 0.70  CALCIUM 8.5*  < > 8.8*  < > 8.9 8.7*  < > 8.6* 9.2 8.8*  MG 2.0  --  1.9  --   --  1.7  --   --   --   --   PHOS  --   --   --   --  3.5 3.2  --   --   --   --   < > = values in this interval not displayed.  Recent Labs  12/26/16 0446 01/05/17 1600 01/08/17 0700  AST ALT 13* 19 14  ALKPHOS 46 81 66  BILITOT 0.7 0.2* 0.5  PROT 5.5* 7.8 6.2*  ALBUMIN 2.9* 3.5 2.9*    Recent Labs  01/05/17 1600 01/06/17 0750 01/08/17 0700  WBC 12.0* 7.7 6.5  NEUTROABS 9.3* 5.1 4.6  HGB 12.4 11.0* 11.0*  HCT 38.0 33.5* 34.0*  MCV 96.2 94.6 96.3  PLT 449* 370 340   Lab Results  Component Value Date   TSH 1.299 11/22/2016   No results found for: HGBA1C No results found for: CHOL, HDL, LDLCALC, LDLDIRECT, TRIG, CHOLHDL  Significant Diagnostic Results in last 30 days:  No results found.  Assessment/Plan   #1 bradycardia-she does have a history of bradycardia I suspect low pressures Katrina this she is not receiving Lopressor considerable amount of time nonetheless are as been no evidence  of rebound tachycardia this was discussed with Dr. Chales Abrahams and will discontinue her Lopressor and continue to monitor very  She does not appear to be symptomatic of any bradycardic episodes-blood pressures are somewhat variable but appear to be fairly satisfactory this will have to be watched continue to monitor pulse and blood pressures every shift.  #2 hypertension again continues on the lisinopril 40 mg a day has variable systolic readings as noted above but I do not see consistent elevations manual again today was 136/60 at this point continue to monitor   [id number]      London Sheer, CMA [phone]

## 2017-02-20 ENCOUNTER — Encounter: Payer: Self-pay | Admitting: Internal Medicine

## 2017-02-20 ENCOUNTER — Non-Acute Institutional Stay (SKILLED_NURSING_FACILITY): Payer: Medicare Other | Admitting: Internal Medicine

## 2017-02-20 DIAGNOSIS — F0390 Unspecified dementia without behavioral disturbance: Secondary | ICD-10-CM

## 2017-02-20 DIAGNOSIS — I10 Essential (primary) hypertension: Secondary | ICD-10-CM

## 2017-02-20 DIAGNOSIS — K224 Dyskinesia of esophagus: Secondary | ICD-10-CM | POA: Diagnosis not present

## 2017-02-20 DIAGNOSIS — M810 Age-related osteoporosis without current pathological fracture: Secondary | ICD-10-CM

## 2017-02-20 NOTE — Progress Notes (Signed)
Location:   [facility] Room Number: 115/W Place of Service:  SNF [phone](31) Provider:  Lenon CurtAnjali,Tyeesha Riker  Thu Baggett, Freddie BreechAnjali L, MD  Patient Care Team: Mahlon GammonGupta, Alliene Klugh L, MD as PCP - General (Internal Medicine) Synetta ShadowLassen, Arlo C, PA-C as Physician Assistant (Internal Medicine)  Extended Emergency Contact Information Primary Emergency Contact: [name],[name]  [place] Home Phone: [phone] Relation: Son Secondary Emergency Contact: [name],[name]  [place] Home Phone: [phone] Mobile Phone: [phone] Relation: Son  Code Status:  Full Code Goals of care: Advanced Directive information Advanced Directives 02/20/2017  Does Patient Have a Medical Advance Directive? Yes  Type of Advance Directive (No Data)  Does patient want to make changes to medical advance directive? No - Patient declined  Copy of Healthcare Power of Attorney in Chart? -  Pre-existing out of facility DNR order (yellow form or pink MOST form) -     Chief Complaint  Patient presents with  . Medical Management of Chronic Issues    Routine Visit    HPI:  Pt is a 83 y.o. female seen today for medical management of chronic diseases.    Patient has H/O Hypertension, recurrent Vomiting, Falls, GERD and Dementia. And recent diagnosis of Osteoporosis  Patient is now Long term resident of SNF. She was admitted in 03/18 for pneumonia to the hospital. She has  been doing well in facility . She did have some issues with Bradycardia and her Lopressor was d/ced. Her BP and pulse has been stable since then. She is actually talking more today. Says she feels depressed that she cannot go home.  But otherwise seems to be getting use to this place. Her mood seems much better then before. Her weight is 116 lbs which is stable. No New Nursing issues.  Past Medical History:  Diagnosis Date  . Asthma   . Dementia   . Diabetes mellitus   . H. pylori infection MAR 2014  . Hypertension     . Stroke [facility]    Past Surgical History:  Procedure Laterality Date  . CHOLECYSTECTOMY    . COLECTOMY     secondary to diverticulitis  . ESOPHAGOGASTRODUODENOSCOPY (EGD) WITH ESOPHAGEAL DILATION N/A 01/07/2013   [id number]:Schatzki ring was found at the gastroesophageal junction/SINGLE DUODENAL DIVERTICULUM    No Known Allergies  Outpatient Encounter Prescriptions as of 02/20/2017  Medication Sig  . aspirin EC 81 MG tablet Take 81 mg by mouth daily.  [name]. Balsam [place]-Castor Oil (VENELEX) OINT Apply to sacrum and bilateral buttocks q shift & prn every shift  . Calcium Carbonate-Vitamin D (CALCIUM 500/VITAMIN D PO) Take 500 mg by mouth 2 (two) times daily.  . citalopram (CELEXA) 20 MG tablet Take 20 mg by mouth daily.   . clotrimazole-betamethasone (LOTRISONE) cream Apply to affected area of bottom every shift  . fluticasone (FLONASE) 50 MCG/ACT nasal spray Place 1 spray into both nostrils 2 (two) times daily as needed for rhinitis.  Marland Kitchen. ipratropium-albuterol (DUONEB) 0.5-2.5 (3) MG/3ML SOLN Take 3 mLs by nebulization every 6 (six) hours as needed (wheezing; shortness of breath).   Marland Kitchen. lisinopril (PRINIVIL,ZESTRIL) 20 MG tablet Take 40 mg by mouth daily.   Marland Kitchen. loratadine (CLARITIN) 10 MG tablet Take 10 mg by mouth daily.  . memantine (NAMENDA) 10 MG tablet Take 10 mg by mouth 2 (two) times daily.  . mirtazapine (REMERON) 15 MG tablet Take 15 mg by mouth at bedtime.  . Nutritional Supplements (ENSURE CLEAR PO) Give ensure clear ( apple ) daily  due to decreased appetite once a day  . omeprazole (PRILOSEC) 40 MG capsule Take 40 mg by mouth 2 (two) times daily.  . ondansetron (ZOFRAN) 4 MG tablet Give 4 mg by mouth four times a day  . potassium chloride SA (K-DUR,KLOR-CON) 20 MEQ tablet Take 20 mEq by mouth daily.  . [DISCONTINUED] metoprolol (LOPRESSOR) 50 MG tablet Take 12.5 mg by mouth 2 (two) times daily.    No facility-administered encounter medications on file as of 02/20/2017.      Review of  Systems  I was able to get ROS this time as patient was talking and responding Appropriately.  Constitutional: Negative for activity change, appetite change, chills, diaphoresis, fatigue and fever.  HENT: Negative for mouth sores, postnasal drip, rhinorrhea, sinus pain and sore throat.   Respiratory: Negative for apnea, cough, chest tightness, shortness of breath and wheezing.   Cardiovascular: Negative for chest pain, palpitations and leg swelling.  Gastrointestinal: Negative for abdominal distention, abdominal pain, constipation, diarrhea, nausea and vomiting.  Genitourinary: Negative for dysuria and frequency.  Musculoskeletal: Negative for arthralgias, joint swelling and myalgias.  Skin: Negative for rash.  Neurological: Negative for dizziness, syncope, weakness, light-headedness and numbness.  Psychiatric/Behavioral: Negative for behavioral problems, confusion and sleep disturbance.     Immunization History  Administered Date(s) Administered  . Influenza-Unspecified 07/20/2016  . Pneumococcal-Unspecified 07/24/2016   Pertinent  Health Maintenance Due  Topic Date Due  . HEMOGLOBIN A1C  September 27, 1932  . FOOT EXAM  06/03/1944  . OPHTHALMOLOGY EXAM  06/03/1944  . URINE MICROALBUMIN  06/03/1944  . DEXA SCAN  06/04/1999  . INFLUENZA VACCINE  05/16/2017  . PNA vac Low Risk Adult (2 of 2 - PCV13) 07/24/2017   No flowsheet data found. Functional Status Survey:    Vitals:   02/20/17 1404  BP: 114/69  Pulse: 91  Resp: 20  Temp: 98.4 F (36.9 C)  TempSrc: Oral   There is no height or weight on file to calculate BMI. Physical Exam  Constitutional: She appears well-developed and well-nourished.  HENT:  Head: Normocephalic.  Mouth/Throat: Oropharynx is clear and moist.  Eyes: Pupils are equal, round, and reactive to light.  Neck: Neck supple.  Cardiovascular: Normal rate, regular rhythm and normal heart sounds.   Pulmonary/Chest: Effort normal and breath sounds normal. No  respiratory distress. She has no wheezes. She has no rales.  Abdominal: Soft. Bowel sounds are normal. She exhibits no distension. There is no tenderness. There is no rebound.  Musculoskeletal: She exhibits no edema.  Neurological: She is alert.  Skin: Skin is warm and dry.  Psychiatric: She has a normal mood and affect. Her behavior is normal.    Labs reviewed:  Recent Labs  06/26/16 0627  09/01/16 0700  11/22/16 0715 12/14/16 0730  01/06/17 0750 01/08/17 0700 01/22/17 0730  NA 137  < > 138  < > 140 140  < > 141 137 139  K 3.9  < > 4.1  < > 4.5 3.8  < > 4.2 4.7 4.2  CL 101  < > 104  < > 102 105  < > 109 103 104  CO2 30  < > 28  < > 30 28  < > 25 26 29   GLUCOSE 109*  < > 99  < > 93 87  < > 96 134* 93  BUN 18  < > 14  < > 11 6  < > 16 16 14   CREATININE 0.63  < > 0.74  < >  0.81 0.69  < > 0.71 0.73 0.70  CALCIUM 8.5*  < > 8.8*  < > 8.9 8.7*  < > 8.6* 9.2 8.8*  MG 2.0  --  1.9  --   --  1.7  --   --   --   --   PHOS  --   --   --   --  3.5 3.2  --   --   --   --   < > = values in this interval not displayed.  Recent Labs  12/26/16 0446 01/05/17 1600 01/08/17 0700  AST 22 24 20   ALT 13* 19 14  ALKPHOS 46 81 66  BILITOT 0.7 0.2* 0.5  PROT 5.5* 7.8 6.2*  ALBUMIN 2.9* 3.5 2.9*    Recent Labs  01/05/17 1600 01/06/17 0750 01/08/17 0700  WBC 12.0* 7.7 6.5  NEUTROABS 9.3* 5.1 4.6  HGB 12.4 11.0* 11.0*  HCT 38.0 33.5* 34.0*  MCV 96.2 94.6 96.3  PLT 449* 370 340   Lab Results  Component Value Date   TSH 1.299 11/22/2016   No results found for: HGBA1C No results found for: CHOL, HDL, LDLCALC, LDLDIRECT, TRIG, CHOLHDL  Significant Diagnostic Results in last 30 days:  No results found.  Assessment/Plan  Hypertension BP stable on Lisinopril. Metoprolol d/ced due to Bradycardia  GERD  Stable with Prilosec and  Zofran before each meal Depression Continue Remeron and Celexa Patient doing very well with both these meds.  Osteoporosis T score -3.67 Continue  Prolia Dementia Stable on Namenda Allergic rhinitis Continue Claritin  Family/ staff Communication:   Labs/tests ordered:

## 2017-03-01 NOTE — Progress Notes (Signed)
This encounter was created in error - please disregard.

## 2017-03-02 ENCOUNTER — Other Ambulatory Visit (HOSPITAL_COMMUNITY)
Admission: RE | Admit: 2017-03-02 | Discharge: 2017-03-02 | Disposition: A | Discharge status: Home / Self Care | Payer: Medicare Other | Source: Skilled Nursing Facility | Attending: Internal Medicine | Admitting: Internal Medicine

## 2017-03-02 ENCOUNTER — Encounter: Payer: Self-pay | Admitting: Internal Medicine

## 2017-03-02 ENCOUNTER — Non-Acute Institutional Stay (SKILLED_NURSING_FACILITY): Payer: Medicare Other | Admitting: Internal Medicine

## 2017-03-02 DIAGNOSIS — Z8701 Personal history of pneumonia (recurrent): Secondary | ICD-10-CM | POA: Diagnosis not present

## 2017-03-02 DIAGNOSIS — E876 Hypokalemia: Secondary | ICD-10-CM

## 2017-03-02 DIAGNOSIS — N390 Urinary tract infection, site not specified: Secondary | ICD-10-CM | POA: Insufficient documentation

## 2017-03-02 DIAGNOSIS — R531 Weakness: Secondary | ICD-10-CM | POA: Diagnosis not present

## 2017-03-02 LAB — CBC WITH DIFFERENTIAL/PLATELET
BASOS ABS: 0.1 10*3/uL (ref 0.0–0.1)
BASOS PCT: 1 %
EOS ABS: 0.3 10*3/uL (ref 0.0–0.7)
Eosinophils Relative: 5 %
HEMATOCRIT: 42.8 % (ref 36.0–46.0)
HEMOGLOBIN: 14.1 g/dL (ref 12.0–15.0)
Lymphocytes Relative: 30 %
Lymphs Abs: 2 10*3/uL (ref 0.7–4.0)
MCH: 31 pg (ref 26.0–34.0)
MCHC: 32.9 g/dL (ref 30.0–36.0)
MCV: 94.1 fL (ref 78.0–100.0)
Monocytes Absolute: 0.9 10*3/uL (ref 0.1–1.0)
Monocytes Relative: 13 %
NEUTROS ABS: 3.3 10*3/uL (ref 1.7–7.7)
NEUTROS PCT: 51 %
Platelets: 208 10*3/uL (ref 150–400)
RBC: 4.55 MIL/uL (ref 3.87–5.11)
RDW: 13.8 % (ref 11.5–15.5)
WBC: 6.5 10*3/uL (ref 4.0–10.5)

## 2017-03-02 LAB — URINALYSIS, ROUTINE W REFLEX MICROSCOPIC
Bilirubin Urine: NEGATIVE
Glucose, UA: NEGATIVE mg/dL
Ketones, ur: NEGATIVE mg/dL
NITRITE: NEGATIVE
PH: 6 (ref 5.0–8.0)
Protein, ur: NEGATIVE mg/dL
SPECIFIC GRAVITY, URINE: 1.01 (ref 1.005–1.030)

## 2017-03-02 LAB — URINALYSIS, MICROSCOPIC (REFLEX): RBC / HPF: NONE SEEN RBC/hpf (ref 0–5)

## 2017-03-02 LAB — COMPREHENSIVE METABOLIC PANEL
ALBUMIN: 3.7 g/dL (ref 3.5–5.0)
ALK PHOS: 60 U/L (ref 38–126)
ALT: 13 U/L — ABNORMAL LOW (ref 14–54)
AST: 28 U/L (ref 15–41)
Anion gap: 7 (ref 5–15)
BUN: 9 mg/dL (ref 6–20)
CHLORIDE: 101 mmol/L (ref 101–111)
CO2: 30 mmol/L (ref 22–32)
CREATININE: 0.77 mg/dL (ref 0.44–1.00)
Calcium: 9.3 mg/dL (ref 8.9–10.3)
GFR calc non Af Amer: >60 mL/min (ref >60)
GLUCOSE: 114 mg/dL — AB (ref 65–99)
Potassium: 3.4 mmol/L — ABNORMAL LOW (ref 3.5–5.1)
SODIUM: 138 mmol/L (ref 135–145)
Total Bilirubin: 0.5 mg/dL (ref 0.3–1.2)
Total Protein: 6.8 g/dL (ref 6.5–8.1)

## 2017-03-02 LAB — TSH: TSH: 0.778 u[IU]/mL (ref 0.350–4.500)

## 2017-03-02 LAB — LIPASE, BLOOD: Lipase: 33 U/L (ref 11–51)

## 2017-03-02 NOTE — Progress Notes (Signed)
Location:   [facility] Room Number: 115/W Place of Service:  SNF [phone]) Provider:  Sabino Dick, MD  Patient Care Team: [name], MD as PCP - General (Internal Medicine) Synetta Shadow as Physician Assistant (Internal Medicine)  Extended Emergency Contact Information Primary Emergency Contact: [name] [place] Home Phone: [phone] Relation: Son Secondary Emergency Contact: [name] [place] Home Phone: [phone] Mobile Phone: [phone] Relation: Son  Code Status:  Full Code Goals of care: Advanced Directive information Advanced Directives 03/02/2017  Does Patient Have a Medical Advance Directive? Yes  Type of Advance Directive (No Data)  Does patient want to make changes to medical advance directive? No - Patient declined  Copy of Healthcare Power of Attorney in Chart? -  Pre-existing out of facility DNR order (yellow form or pink MOST form) -     Chief Complaint  Patient presents with  . Acute Visit    Not feelling well    HPI:  Pt is a 83 y.o. female seen today for an acute visit for Nursing staff concerns that patient is not feeling well.  Patient usually sits out in the hallway pleasant smiling somewhat confused.  However today she is staying in bed and just appears weak.  Her vital signs are unremarkable.  She is not complaining of any pain shortness of breath or discomfort nursing staff just feels that she is not at her baseline  She does have a history of hypertension-recurrent vomiting-dementia-GERD-osteoporosis-as well as hospitalization previously for pneumonia.  There have been no recent medication changes        Past Medical History:  Diagnosis Date  . Asthma   . Dementia   . Diabetes mellitus   . H. pylori infection MAR 2014  . Hypertension   . Stroke [facility])    Past Surgical History:  Procedure Laterality Date  . CHOLECYSTECTOMY    .  COLECTOMY     secondary to diverticulitis  . ESOPHAGOGASTRODUODENOSCOPY (EGD) WITH ESOPHAGEAL DILATION N/A 01/07/2013   [id number] ring was found at the gastroesophageal junction/SINGLE DUODENAL DIVERTICULUM    No Known Allergies  Outpatient Encounter Prescriptions as of 03/02/2017  Medication Sig  . aspirin EC 81 MG tablet Take 81 mg by mouth daily.  [name] [place]-Castor Oil (VENELEX) OINT Apply to sacrum and bilateral buttocks q shift & prn every shift  . Calcium Carbonate-Vitamin D (CALCIUM 500/VITAMIN D PO) Take 500 mg by mouth 2 (two) times daily.  . citalopram (CELEXA) 20 MG tablet Take 20 mg by mouth daily.   . clotrimazole-betamethasone (LOTRISONE) cream Apply to affected area of bottom every shift  . fluticasone (FLONASE) 50 MCG/ACT nasal spray Place 1 spray into both nostrils 2 (two) times daily as needed for rhinitis.  Marland Kitchen ipratropium-albuterol (DUONEB) 0.5-2.5 (3) MG/3ML SOLN Take 3 mLs by nebulization every 6 (six) hours as needed (wheezing; shortness of breath).   Marland Kitchen lisinopril (PRINIVIL,ZESTRIL) 20 MG tablet Take 40 mg by mouth daily.   Marland Kitchen loratadine (CLARITIN) 10 MG tablet Take 10 mg by mouth daily.  . memantine (NAMENDA) 10 MG tablet Take 10 mg by mouth 2 (two) times daily.  . mirtazapine (REMERON) 15 MG tablet Take 15 mg by mouth at bedtime.  . Nutritional Supplements (ENSURE CLEAR PO) Give ensure clear ( apple ) daily due to decreased appetite twice a day  . omeprazole (PRILOSEC) 40 MG capsule Take 40 mg by mouth 2 (two) times daily.  Marland Kitchen  ondansetron (ZOFRAN) 4 MG tablet Give 4 mg by mouth four times a day  . potassium chloride SA (K-DUR,KLOR-CON) 20 MEQ tablet Take 20 mEq by mouth daily.   No facility-administered encounter medications on file as of 03/02/2017.     Review of Systems   Provided by patient who is a poor story and as well as nursing.  General no complaints of fever chills just appears weaker.  Skin does not complain of rashes or itching.  Head ears eyes  nose mouth and throat does not complain of difficulty swallowing or visual changes or headache.  Respiratory is not complaining of shortness of breath nursing staff has not really noted much coughing.  Cardiac does not complaining of any chest pain or increased edema.  GI does not complain of abdominal discomfort she does have some history of recurrent vomiting but this is stabilized is not complaining of nausea.  GUl --says it does not hurt or burn when she urinates.  Musculoskeletal-is not complaining of joint pain.  Neurologic is not complaining of any dizziness or headache or syncopal-type feelings.  Psych continues with dementia but is pleasant and appropriate when asked questions-.        Immunization History  Administered Date(s) Administered  . Influenza-Unspecified 07/20/2016  . Pneumococcal-Unspecified 07/24/2016   Pertinent  Health Maintenance Due  Topic Date Due  . HEMOGLOBIN A1C  1932-02-22  . FOOT EXAM  06/03/1942  . OPHTHALMOLOGY EXAM  06[birth year]03/1942  . URINE MICROALBUMIN  06[birth year]03/1942  . DEXA SCAN  06/04/1999  . INFLUENZA VACCINE  05/16/2017  . PNA vac Low Risk Adult (2 of 2 - PCV13) 07/24/2017   No flowsheet data found. Functional Status Survey:    Vitals:   03/02/17 1435  BP: (!) 129/58  Pulse: 62  Resp: 18  Temp: 98 F (36.7 C)  TempSrc: Oral  SpO2: 96%    Physical Exam   In general this is a somewhat frail elderly female in no distress lying in bed she appears somewhat weaker than her baseline.  Her skin is warm and dry.  Eyes pupils appear reactive light visual acuity appears grossly intact sclera and conjunctiva are clear.  Oropharynx is clear mucous membranes moist.  Chest is clear to auscultation with somewhat shallow air entry at could not really appreciate any congestion there is no labored breathing.  Heart is regular rhythm and rate she does not have significant lower extremity edema I could not appreciate any murmur gallop or  rub.  Her abdomen is soft nontender with positive bowel sounds.  Muscle skeletal  Is able to move all her extremities at baseline I do not note any strength deficits different than her baseline she does have diffuse arthritic changes which are not new.  Neurologic is grossly intact I could not really appreciate lateralizing findings her speech is clear. --moves all  extremities at baseline strength cranial nerves appear grossly intact  Psych she is oriented to self does follow simple verbal commands.   Labs reviewed:  Recent Labs  06/26/16 0627  09/01/16 0700  11/22/16 0715 12/14/16 0730  01/06/17 0750 01/08/17 0700 01/22/17 0730  NA 137  < > 138  < > 140 140  < > 141 137 139  K 3.9  < > 4.1  < > 4.5 3.8  < > 4.2 4.7 4.2  CL 101  < > 104  < > 102 105  < > 109 103 104  CO2 30  < > 28  < >  30 28  < > 25 26 29   GLUCOSE 109*  < > 99  < > 93 87  < > 96 134* 93  BUN 18  < > 14  < > 11 6  < > 16 16 14   CREATININE 0.63  < > 0.74  < > 0.81 0.69  < > 0.71 0.73 0.70  CALCIUM 8.5*  < > 8.8*  < > 8.9 8.7*  < > 8.6* 9.2 8.8*  MG 2.0  --  1.9  --   --  1.7  --   --   --   --   PHOS  --   --   --   --  3.5 3.2  --   --   --   --   < > = values in this interval not displayed.  Recent Labs  12/26/16 0446 01/05/17 1600 01/08/17 0700  AST 22 24 20   ALT 13* 19 14  ALKPHOS 46 81 66  BILITOT 0.7 0.2* 0.5  PROT 5.5* 7.8 6.2*  ALBUMIN 2.9* 3.5 2.9*    Recent Labs  01/05/17 1600 01/06/17 0750 01/08/17 0700  WBC 12.0* 7.7 6.5  NEUTROABS 9.3* 5.1 4.6  HGB 12.4 11.0* 11.0*  HCT 38.0 33.5* 34.0*  MCV 96.2 94.6 96.3  PLT 449* 370 340   Lab Results  Component Value Date   TSH 1.299 11/22/2016   No results found for: HGBA1C No results found for: CHOL, HDL, LDLCALC, LDLDIRECT, TRIG, CHOLHDL  Significant Diagnostic Results in last 30 days:  No results found.  Assessment/Plan  #1 increased weakness change in status-physical exam was nonspecific she just appears somewhat  weaker than she normally does lying in bed today Will obtain stat blood work including a CBC with differential a CMP-lipase-as well as a TSH.  Also with her history of pneumonia will update a chest x-ray.  Also will obtain a urinalysis and culture  UPDATE--we have received updated labs which actually are reassuring white count is within normal limits at 6.5 hemoglobin 14.1 platelets 208.  . Metabolic panel shows slight hypokalemia with potassium of 3.4.  Sodium is 138 BUN is 9 creatinine 0.77-CO2 level was 30-liver function tests are essentially unremarkable.  TSH is low normal at 0.778-lipase is within normal range At 33.  Patient continues to lie in bed she does not certainly appear to be declining from earlier in the day   but will have to be watched will watch with vital signs pulse ox every shift for 72 hours.  Also will supplement potassium by increasing it up to 30 mEq daily-and updated metabolic panel on Monday, May 21.  Also chest x-rays pending will make her duo nebs in the meantime routine twice a day and continue every 6 hours when necessary for suspicions possibly she may have an early developing pneumonia  [id number]-of note greater than 35 minutes spent assessing patient-discussing her status with nursing staff reviewing her chart reviewing her labs-updating labs and making adjustments-and coordinating plan of care-of note greater than 50% of time spent coordinating plan of care with input as noted above

## 2017-03-04 LAB — URINE CULTURE

## 2017-03-05 ENCOUNTER — Encounter: Payer: Self-pay | Admitting: Internal Medicine

## 2017-03-05 ENCOUNTER — Encounter (HOSPITAL_COMMUNITY)
Admission: RE | Admit: 2017-03-05 | Discharge: 2017-03-05 | Disposition: A | Discharge status: Home / Self Care | Payer: Medicare Other | Source: Skilled Nursing Facility | Attending: Internal Medicine | Admitting: Internal Medicine

## 2017-03-05 ENCOUNTER — Non-Acute Institutional Stay (SKILLED_NURSING_FACILITY): Payer: Medicare Other | Admitting: Internal Medicine

## 2017-03-05 DIAGNOSIS — F0390 Unspecified dementia without behavioral disturbance: Secondary | ICD-10-CM | POA: Insufficient documentation

## 2017-03-05 DIAGNOSIS — Z5189 Encounter for other specified aftercare: Secondary | ICD-10-CM | POA: Insufficient documentation

## 2017-03-05 DIAGNOSIS — M810 Age-related osteoporosis without current pathological fracture: Secondary | ICD-10-CM | POA: Insufficient documentation

## 2017-03-05 DIAGNOSIS — E876 Hypokalemia: Secondary | ICD-10-CM | POA: Diagnosis not present

## 2017-03-05 DIAGNOSIS — R531 Weakness: Secondary | ICD-10-CM

## 2017-03-05 DIAGNOSIS — I10 Essential (primary) hypertension: Secondary | ICD-10-CM

## 2017-03-05 DIAGNOSIS — J9611 Chronic respiratory failure with hypoxia: Secondary | ICD-10-CM | POA: Diagnosis present

## 2017-03-05 DIAGNOSIS — N390 Urinary tract infection, site not specified: Secondary | ICD-10-CM | POA: Diagnosis present

## 2017-03-05 LAB — BASIC METABOLIC PANEL
ANION GAP: 7 (ref 5–15)
BUN: 9 mg/dL (ref 6–20)
CHLORIDE: 102 mmol/L (ref 101–111)
CO2: 31 mmol/L (ref 22–32)
Calcium: 9.1 mg/dL (ref 8.9–10.3)
Creatinine, Ser: 0.82 mg/dL (ref 0.44–1.00)
GFR calc Af Amer: >60 mL/min (ref >60)
Glucose, Bld: 87 mg/dL (ref 65–99)
POTASSIUM: 4.9 mmol/L (ref 3.5–5.1)
SODIUM: 140 mmol/L (ref 135–145)

## 2017-03-05 NOTE — Progress Notes (Signed)
Location:    [facility] Room Number: 115/W Place of Service:  SNF [phone]) Provider:  Sabino Dick, MD  Patient Care Team: [name], MD as PCP - General (Internal Medicine) Synetta Shadow as Physician Assistant (Internal Medicine)  Extended Emergency Contact Information Primary Emergency Contact: [name] [place] Home Phone: [phone] Relation: Son Secondary Emergency Contact: [name] [place] Home Phone: [phone] Mobile Phone: [phone] Relation: Son  Code Status:  Full Code Goals of care: Advanced Directive information Advanced Directives 03/05/2017  Does Patient Have a Medical Advance Directive? Yes  Type of Advance Directive (No Data)  Does patient want to make changes to medical advance directive? No - Patient declined  Copy of Healthcare Power of Attorney in Chart? -  Pre-existing out of facility DNR order (yellow form or pink MOST form) -     Chief Complaint  Patient presents with  . Acute Visit    F/U  For weakness  HPI:  Pt is a 83 y.o. female seen today for an acute visit for  Follow-up of weakness.  Patient was seen Friday for increased weakness and staying in bed which was quite unusual for her.  She did not complain of any shortness breath chest pain cough or congestion-she does have a history pneumonia and this was of concern so we ordered chest x-ray which actually is come back not showing any acute process.  She has also had a urine done which shows multiple species non-predominant she is not complaining of dysuria.  We did do blood work which was unremarkable white count was normal at 6.5 hemoglobin was up to 14.1--potassium was slightly low at 3.4 and we did increase her potassium dose.  Nursing staff says she appears to be more at her baseline today apparently this weakness was transitory.  We did update her metabolic panel shows potassium is up  to 4.9 will reduce her potassium dose slightly.  Also noted her blood pressure at times is somewhat lower listed reading today is 92/55 previous readings 117/60-98/56-she did not overtly complain of any dizziness syncope or weakness today  She is on lisinopril 40 mg a day.  And I actually got 110/60 on manual recheck today  Currently she says she feels a bit better but not 100% she is eating and drinking apparently is close to her baseline according to nursing    Past Medical History:  Diagnosis Date  . Asthma   . Dementia   . Diabetes mellitus   . H. pylori infection MAR 2014  . Hypertension   . Stroke [facility])    Past Surgical History:  Procedure Laterality Date  . CHOLECYSTECTOMY    . COLECTOMY     secondary to diverticulitis  . ESOPHAGOGASTRODUODENOSCOPY (EGD) WITH ESOPHAGEAL DILATION N/A 01/07/2013   [id number] ring was found at the gastroesophageal junction/SINGLE DUODENAL DIVERTICULUM    No Known Allergies  Outpatient Encounter Prescriptions as of 03/05/2017  Medication Sig  . aspirin EC 81 MG tablet Take 81 mg by mouth daily.  [name] [place]-Castor Oil (VENELEX) OINT Apply to sacrum and bilateral buttocks q shift & prn every shift  . Calcium Carbonate-Vitamin D (CALCIUM 500/VITAMIN D PO) Take 500 mg by mouth 2 (two) times daily.  . citalopram (CELEXA) 20 MG tablet Take 20 mg by mouth daily.   . clotrimazole-betamethasone (LOTRISONE) cream Apply to affected area of bottom every shift  . fluticasone (FLONASE) 50 MCG/ACT  nasal spray Place 1 spray into both nostrils 2 (two) times daily as needed for rhinitis.  Marland Kitchen ipratropium-albuterol (DUONEB) 0.5-2.5 (3) MG/3ML SOLN Take 3 mLs by nebulization 2 (two) times daily. And may give every 6 hours as needed prn  . lisinopril (PRINIVIL,ZESTRIL) 20 MG tablet Take 40 mg by mouth daily.   Marland Kitchen loratadine (CLARITIN) 10 MG tablet Take 10 mg by mouth daily.  . memantine (NAMENDA) 10 MG tablet Take 10 mg by mouth 2 (two) times daily.  .  mirtazapine (REMERON) 15 MG tablet Take 15 mg by mouth at bedtime.  . Nutritional Supplements (ENSURE CLEAR PO) Give ensure clear ( apple ) daily due to decreased appetite twice a day  . omeprazole (PRILOSEC) 40 MG capsule Take 40 mg by mouth 2 (two) times daily.  . ondansetron (ZOFRAN) 4 MG tablet Give 4 mg by mouth four times a day  . potassium chloride (K-DUR,KLOR-CON) 10 MEQ tablet Take 30 mEq by mouth daily.  . [DISCONTINUED] potassium chloride SA (K-DUR,KLOR-CON) 20 MEQ tablet Take 30 mEq by mouth daily.    No facility-administered encounter medications on file as of 03/05/2017.     Review of Systems Provided by patient who is a poor historian  as well as nursing.  General no complaints of fever chills  Skin does not complain of rashes or itching.  Head ears eyes nose mouth and throat does not complain of difficulty swallowing or visual changes or headache.  Respiratory is not complaining of shortness of breath nursing staff has not really noted much coughing.  Cardiac does not complaining of any chest pain or increased edema.  GI does not complain of abdominal discomfort she does have some history of recurrent vomiting but this is stabilized is not complaining of nausea.  GUl --says it does not hurt or burn when she urinates.  Musculoskeletal-is not complaining of joint pain.  Neurologic is not complaining of any dizziness or headache or syncopal-type feelings.  Psych continues with dementia but is pleasant and appropriate when asked questions-.       Immunization History  Administered Date(s) Administered  . Influenza-Unspecified 07/20/2016  . Pneumococcal-Unspecified 07/24/2016   Pertinent  Health Maintenance Due  Topic Date Due  . HEMOGLOBIN A1C  [birth year]  . FOOT EXAM  06/03/1942  . OPHTHALMOLOGY EXAM  06[birth year]03/1942  . URINE MICROALBUMIN  [birth year]03/1942  . DEXA SCAN  06/04/1999  . INFLUENZA VACCINE  05/16/2017  . PNA vac Low Risk Adult (2 of 2 -  PCV13) 07/24/2017   No flowsheet data found. Functional Status Survey:    Vitals:   03/05/17 1211  BP: (!) 92/55  Pulse: 71  Resp: 18  Temp: 97.2 F (36.2 C)  TempSrc: Oral  SpO2: 97%  Manual recheck a blood pressure was 110/60  Physical Exam  In general this is a somewhat frail elderly female in no distress upper and her wheelchair today which is her baseline  Her skin is warm and dry.  Eyes pupils appear reactive light visual acuity appears grossly intact sclera and conjunctiva are clear.  Oropharynx is clear mucous membranes moist.  Chest is clear to auscultation with somewhat shallow air entry at could not really appreciate any congestion there is no labored breathing.  Heart is regular rhythm and rateshe does not have significant lower extremity edema I could not appreciate any murmur gallop or rub.  Her abdomen is soft nontender with positive bowel sounds.  Muscle skeletal  Is able to move all her extremities at  baseline I do not note any strength deficits different than her baseline she does have diffuse arthritic changes which are not new.  Neurologic is grossly intact I could not really appreciate lateralizing findings her speech is clear. --moves all  extremities at baseline strength cranial nerves appear grossly intact  Psych she is oriented to self does follow simple verbal commands. Appears more like herself today smiling more interactive   Labs reviewed:  Recent Labs  06/26/16 0627  09/01/16 0700  11/22/16 0715 12/14/16 0730  01/22/17 0730 03/02/17 1500 03/05/17 0730  NA 137  < > 138  < > 140 140  < > 139 138 140  K 3.9  < > 4.1  < > 4.5 3.8  < > 4.2 3.4* 4.9  CL 101  < > 104  < > 102 105  < > 104 101 102  CO2 30  < > 28  < > 30 28  < > 29 30 31   GLUCOSE 109*  < > 99  < > 93 87  < > 93 114* 87  BUN 18  < > 14  < > 11 6  < > 14 9 9   CREATININE 0.63  < > 0.74  < > 0.81 0.69  < > 0.70 0.77 0.82  CALCIUM 8.5*  < > 8.8*  < > 8.9 8.7*  < >  8.8* 9.3 9.1  MG 2.0  --  1.9  --   --  1.7  --   --   --   --   PHOS  --   --   --   --  3.5 3.2  --   --   --   --   < > = values in this interval not displayed.  Recent Labs  01/05/17 1600 01/08/17 0700 03/02/17 1500  AST 24 20 28   ALT 19 14 13*  ALKPHOS 81 66 60  BILITOT 0.2* 0.5 0.5  PROT 7.8 6.2* 6.8  ALBUMIN 3.5 2.9* 3.7    Recent Labs  01/06/17 0750 01/08/17 0700 03/02/17 1500  WBC 7.7 6.5 6.5  NEUTROABS 5.1 4.6 3.3  HGB 11.0* 11.0* 14.1  HCT 33.5* 34.0* 42.8  MCV 94.6 96.3 94.1  PLT 370 340 208   Lab Results  Component Value Date   TSH 0.778 03/02/2017   No results found for: HGBA1C No results found for: CHOL, HDL, LDLCALC, LDLDIRECT, TRIG, CHOLHDL  Significant Diagnostic Results in last 30 days:  No results found.  Assessment/Plan  #1 history of increased weakness changes data status she appears to be more at her baseline today she is more smiling and interactive up in her wheelchair but says she still feels a bit weaker than she normally does in nursing staff concurs-at this point will continue to monitor but hers presentation today is reassuring labs chest x-ray was reassuring urinalysis was not really suggestive of infection.  We will continue to nebs twice a day as well as when necessary-again she does have some history of respiratory issues in the past.  #2 history of mild hypokalemia this is been supplemented and potassium has risen-4 0.9 this occurred fairly rapidly will decrease her potassium back to 20 mEq a day and recheck a BMP later this week.  #3 history of hypertension she is on lisinopril 40 mg a day blood pressure today was 110/60 manually machine at times will register systolic in the 90s-will reduce her lisinopril down to 20 mg a day and hold for systolic  blood pressure of less than 105.  [id number]

## 2017-03-08 ENCOUNTER — Encounter (HOSPITAL_COMMUNITY)
Admission: RE | Admit: 2017-03-08 | Discharge: 2017-03-08 | Disposition: A | Discharge status: Home / Self Care | Payer: Medicare Other | Source: Skilled Nursing Facility | Attending: Internal Medicine | Admitting: Internal Medicine

## 2017-03-08 DIAGNOSIS — N390 Urinary tract infection, site not specified: Secondary | ICD-10-CM | POA: Diagnosis present

## 2017-03-08 DIAGNOSIS — Z5189 Encounter for other specified aftercare: Secondary | ICD-10-CM | POA: Insufficient documentation

## 2017-03-08 DIAGNOSIS — M810 Age-related osteoporosis without current pathological fracture: Secondary | ICD-10-CM | POA: Diagnosis present

## 2017-03-08 DIAGNOSIS — F0390 Unspecified dementia without behavioral disturbance: Secondary | ICD-10-CM | POA: Diagnosis not present

## 2017-03-08 LAB — BASIC METABOLIC PANEL
ANION GAP: 7 (ref 5–15)
BUN: 16 mg/dL (ref 6–20)
CHLORIDE: 103 mmol/L (ref 101–111)
CO2: 28 mmol/L (ref 22–32)
Calcium: 8.5 mg/dL — ABNORMAL LOW (ref 8.9–10.3)
Creatinine, Ser: 0.85 mg/dL (ref 0.44–1.00)
GFR calc Af Amer: >60 mL/min (ref >60)
Glucose, Bld: 101 mg/dL — ABNORMAL HIGH (ref 65–99)
POTASSIUM: 4.3 mmol/L (ref 3.5–5.1)
SODIUM: 138 mmol/L (ref 135–145)

## 2017-03-21 ENCOUNTER — Encounter: Payer: Self-pay | Admitting: Internal Medicine

## 2017-03-21 ENCOUNTER — Non-Acute Institutional Stay (SKILLED_NURSING_FACILITY): Payer: Medicare Other | Admitting: Internal Medicine

## 2017-03-21 DIAGNOSIS — F0390 Unspecified dementia without behavioral disturbance: Secondary | ICD-10-CM

## 2017-03-21 DIAGNOSIS — E119 Type 2 diabetes mellitus without complications: Secondary | ICD-10-CM

## 2017-03-21 DIAGNOSIS — M810 Age-related osteoporosis without current pathological fracture: Secondary | ICD-10-CM

## 2017-03-21 DIAGNOSIS — R634 Abnormal weight loss: Secondary | ICD-10-CM | POA: Diagnosis not present

## 2017-03-21 DIAGNOSIS — I10 Essential (primary) hypertension: Secondary | ICD-10-CM

## 2017-03-21 DIAGNOSIS — K224 Dyskinesia of esophagus: Secondary | ICD-10-CM | POA: Diagnosis not present

## 2017-03-21 NOTE — Progress Notes (Signed)
Location:   [facility] Room Number: 115/W Place of Service:  SNF [phone]) Provider:  Sabino Dick, MD  Patient Care Team: [name], MD as PCP - General (Internal Medicine) Synetta Shadow as Physician Assistant (Internal Medicine)  Extended Emergency Contact Information Primary Emergency Contact: [name] [place] Home Phone: [phone] Relation: Son Secondary Emergency Contact: [name] [place] Home Phone: [phone] Mobile Phone: [phone] Relation: Son  Code Status:  Full Code Goals of care: Advanced Directive information Advanced Directives 03/21/2017  Does Patient Have a Medical Advance Directive? Yes  Type of Advance Directive (No Data)  Does patient want to make changes to medical advance directive? No - Patient declined  Copy of Healthcare Power of Attorney in Chart? -  Pre-existing out of facility DNR order (yellow form or pink MOST form) -     Chief Complaint  Patient presents with  . Medical Management of Chronic Issues    Routine Visit   Her medical chronic medical conditions including dementia and recurrent vomiting GERD hypertension osteoporosis-also noted she has some weight loss HPI:  Pt is a 83 y.o. female seen today for medical management of chronic diseases as noted above.  She was most recently seen for some increased weakness although her labs did not really show anything acute abnormality did show some mild hypokalemia which was supplemented-urine grew out multiple species none predominant.  Chest x-ray did not really show anything acute she does have some history of pneumonia which has required treatment in the past.  I note however she has lost some weight appears about 5 pounds since the beginning of the month.  I noticed and dinner tonight she did not eat much I was able to 25% she appeared to take her fluids well however.  She does not complain of  any dysphagia or sore throat or abdominal discomfort.  She does have a history of depression and is followed by psychiatric services Celexa was recently reduced-she is also on Namenda with a history of dementia.  Regards to other issues her Lopressor was discontinued earlier this year because of bradycardia and some low blood pressures occasionally she still has some mild bradycardia but does not appear to be consistent I see listed pulse is ranging from the 50s up to the 90s.  At times she does have systolic blood pressures in the 90s but this is not consistent either I got 120/60 manually tonight I see previous readings today of 95/58 I also see previous readings earlier 131/78 101/63 appears to be some variability  She is on lisinopril 20 mg a day but there are orders to hold this for systolic blood pressure less than 105.      She does have some history of recurrent vomiting but appears to have responded well to the routine Zofran nursing staff does not report really any vomiting episodes recently.  She is also on a proton pump inhibitor     Past Medical History:  Diagnosis Date  . Asthma   . Dementia   . Diabetes mellitus   . H. pylori infection MAR 2014  . Hypertension   . Stroke [facility])    Past Surgical History:  Procedure Laterality Date  . CHOLECYSTECTOMY    . COLECTOMY     secondary to diverticulitis  . ESOPHAGOGASTRODUODENOSCOPY (EGD) WITH ESOPHAGEAL DILATION N/A 01/07/2013   QIO:NGEXBMWU ring was found at the gastroesophageal junction/SINGLE DUODENAL DIVERTICULUM  No Known Allergies  Outpatient Encounter Prescriptions as of 03/21/2017  Medication Sig  . aspirin EC 81 MG tablet Take 81 mg by mouth daily.  [name] [place]-Castor Oil (VENELEX) OINT Apply to sacrum and bilateral buttocks q shift & prn every shift  . Calcium Carbonate-Vitamin D (CALCIUM 500/VITAMIN D PO) Take 500 mg by mouth 2 (two) times daily.  . citalopram (CELEXA) 20 MG tablet Take 20 mg by mouth  daily.   . clotrimazole-betamethasone (LOTRISONE) cream Apply to affected area of bottom every shift  . fluticasone (FLONASE) 50 MCG/ACT nasal spray Place 1 spray into both nostrils 2 (two) times daily as needed for rhinitis.  Marland Kitchen ipratropium-albuterol (DUONEB) 0.5-2.5 (3) MG/3ML SOLN Take 3 mLs by nebulization 2 (two) times daily. And may give every 6 hours as needed prn  . lisinopril (PRINIVIL,ZESTRIL) 20 MG tablet Take 20 mg by mouth daily.   Marland Kitchen loratadine (CLARITIN) 10 MG tablet Take 10 mg by mouth daily.  . memantine (NAMENDA) 10 MG tablet Take 10 mg by mouth 2 (two) times daily.  . Menthol, Topical Analgesic, (BIOFREEZE) 4 % GEL Apply prn to knees for pain management as needed daily  . mirtazapine (REMERON) 15 MG tablet Take 15 mg by mouth at bedtime.  . Nutritional Supplements (ENSURE CLEAR PO) Give ensure clear ( apple ) daily due to decreased appetite twice a day  . omeprazole (PRILOSEC) 40 MG capsule Take 40 mg by mouth 2 (two) times daily.  . ondansetron (ZOFRAN) 4 MG tablet Give 4 mg by mouth four times a day  . potassium chloride (K-DUR,KLOR-CON) 10 MEQ tablet Take 20 mEq by mouth daily.    No facility-administered encounter medications on file as of 03/21/2017.      Review of Systems  This is limited secondary to dementia however she is not complaining any fever or chills her she's lost some weight during  Skin does not complain of diaphoresis or itching.  Head ears eyes nose mouth and throat does not complain of visual changes or sore throat or difficulty swallowing.  Respiratory is not complaining of shortness of breath or cough does have a history of pneumonia.  Cardiac does not complain of chest pain does not appear to have significant lower extremity edema.  GI is not complaining of any nausea or vomiting recently does not complain of abdominal pain diarrhea constipation.  Muscle skeletal denies joint pain.  Neurologic is not complaining of dizziness headache or  syncopal-type feelings.  Psych does have a history of depression dementia continues to be pleasant and responds appropriately to simple questions--denies anything is really bothering her at this times-says she just doesn't have much of an appetite while L the area area always call   Immunization History  Administered Date(s) Administered  . Influenza-Unspecified 07/20/2016  . Pneumococcal-Unspecified 07/24/2016   Pertinent  Health Maintenance Due  Topic Date Due  . HEMOGLOBIN A1C  [birth year]  . FOOT EXAM  06/03/1942  . OPHTHALMOLOGY EXAM  06[birth year]03/1942  . URINE MICROALBUMIN  06/03/1942  . DEXA SCAN  06/04/1999  . INFLUENZA VACCINE  05/16/2017  . PNA vac Low Risk Adult (2 of 2 - PCV13) 07/24/2017   No flowsheet data found. Functional Status Survey:    Vitals:   03/21/17 1438  BP: (!) 95/58  Pulse: (!) 59  Resp: 15  Temp: 98.8 F (37.1 C)  TempSrc: Oral  SpO2: 97%  Of note pulse was 62 on manual exam blood pressure was 120/60  Weight is 110 pounds  it appears it was 115 earlier this month  Physical Exam   In general this is a somewhat frail elderly female in no distress here somewhat frail  Her skin is warm and dry.  Eyes pupils appear reactive light visual acuity appears grossly intact sclera and conjunctiva are clear.  Oropharynx is clear mucous membranes moist.  Chest is clear to auscultation with somewhat shallow air entry at could not really appreciate any congestion there is no labored breathing.  Heart is regular rhythm with frequent irregular beats and rate  Of 62--she does not have significant lower extremity edema I could not appreciate any murmur gallop or rub.  Her abdomen is soft nontender with positive bowel sounds.  Muscle skeletal  Is able to move all her extremities at baseline I do not note any strength deficits different than her baseline she does have diffuse arthritic changes which are not new.  Neurologic is grossly intact I could not  really appreciate lateralizing findings her speech is clear. --moves all extremities at baseline strength cranial nerves appear grossly intact  Psych she is oriented to self does follow simple verbal commands.  she is pleasant and responds appropriately to simple questions    Labs reviewed:  Recent Labs  06/26/16 0627  09/01/16 0700  11/22/16 0715 12/14/16 0730  03/02/17 1500 03/05/17 0730 03/08/17 0700  NA 137  < > 138  < > 140 140  < > 138 140 138  K 3.9  < > 4.1  < > 4.5 3.8  < > 3.4* 4.9 4.3  CL 101  < > 104  < > 102 105  < > 101 102 103  CO2 30  < > 28  < > 30 28  < > 30 31 28   GLUCOSE 109*  < > 99  < > 93 87  < > 114* 87 101*  BUN 18  < > 14  < > 11 6  < > 9 9 16   CREATININE 0.63  < > 0.74  < > 0.81 0.69  < > 0.77 0.82 0.85  CALCIUM 8.5*  < > 8.8*  < > 8.9 8.7*  < > 9.3 9.1 8.5*  MG 2.0  --  1.9  --   --  1.7  --   --   --   --   PHOS  --   --   --   --  3.5 3.2  --   --   --   --   < > = values in this interval not displayed.  Recent Labs  01/05/17 1600 01/08/17 0700 03/02/17 1500  AST 24 20 28   ALT 19 14 13*  ALKPHOS 81 66 60  BILITOT 0.2* 0.5 0.5  PROT 7.8 6.2* 6.8  ALBUMIN 3.5 2.9* 3.7    Recent Labs  01/06/17 0750 01/08/17 0700 03/02/17 1500  WBC 7.7 6.5 6.5  NEUTROABS 5.1 4.6 3.3  HGB 11.0* 11.0* 14.1  HCT 33.5* 34.0* 42.8  MCV 94.6 96.3 94.1  PLT 370 340 208   Lab Results  Component Value Date   TSH 0.778 03/02/2017   No results found for: HGBA1C No results found for: CHOL, HDL, LDLCALC, LDLDIRECT, TRIG, CHOLHDL  Significant Diagnostic Results in last 30 days:  No results found.  Assessment/Plan    Dementia-this appears relatively stable she is on Namenda she is also on Celexa as well as Remeron for coexistent depression-she has had some weight loss here will order a CMP to check  her albumin level-cannot rule out there may be a psychological component to this-will have psych evaluate this as well for possible titration of her depression  medications.  #2 hypertension-blood pressures are variable as noted above manually 120/60 tonight appear to be satisfactory there are orders to hold her Lopressor for systolic blood pressure less than 105 which appears happens occasionally-her Lopressor has been discontinued secondary to bradycardia.  #3 history of GERD with recurrent vomiting--esophageal dysmotility-- apparently this is stabilized she is on Zofran routinely she is also on a proton pump inhibitor.  #4 history osteoporosis with a T score of -3.67 she is on Prolia as well as calcium--she does have a history of a proximal humerus fracture  #5 history allergic rhinitis continues on Claritin. As well as Flonase  le #6 history of hypokalemia she is on potassium 20 mEq a day we did bump this up to 30 for short period econdary to a mildly low potassium of 3.4-we will update this tomorrow to ensure stability.  #7-history of pneumonia respiratory issues this appears to be stable now she does have an order for nebulizers apparently is tolerating this well.  #8 apparently there's some history of diabetes type 2 blood sugars in the past have been largely in the 80s to low 100s-at note on recent metabolic panel blood sugars glucose were 101 and 114 we'll update a hemoglobin A1c   Again will update a CMP secondary to weight loss also to follow up her potassium-also will order a psych consult secondary to possible depression contributing to her weight loss this will need to be monitored she is on supplements including ensure Will also have dietary look at this.  CPT--99310-of note greater than 40 minutes spent assessing patient-reviewing her chart-reviewing her labs-and coordinating formulating care for numerous diagnoses-note  greater than 50% of time spent coordinating plan of care

## 2017-03-26 ENCOUNTER — Non-Acute Institutional Stay (SKILLED_NURSING_FACILITY): Payer: Medicare Other | Admitting: Internal Medicine

## 2017-03-26 ENCOUNTER — Encounter: Payer: Self-pay | Admitting: Internal Medicine

## 2017-03-26 DIAGNOSIS — K649 Unspecified hemorrhoids: Secondary | ICD-10-CM | POA: Diagnosis not present

## 2017-03-26 DIAGNOSIS — K921 Melena: Secondary | ICD-10-CM

## 2017-03-26 NOTE — Progress Notes (Signed)
Location:   [facility] Room Number: 115/W Place of Service:  SNF [phone]) Provider:  Sabino Dick, MD  Patient Care Team: [name], MD as PCP - General (Internal Medicine) Synetta Shadow as Physician Assistant (Internal Medicine)  Extended Emergency Contact Information Primary Emergency Contact: [name] [place] Home Phone: [phone] Relation: Son Secondary Emergency Contact: [name] [place] Home Phone: [phone] Mobile Phone: [phone] Relation: Son  Code Status:  Full Code Goals of care: Advanced Directive information Advanced Directives 03/26/2017  Does Patient Have a Medical Advance Directive? Yes  Type of Advance Directive (No Data)  Does patient want to make changes to medical advance directive? No - Patient declined  Copy of Healthcare Power of Attorney in Chart? -  Pre-existing out of facility DNR order (yellow form or pink MOST form) -   Chief complaint-acute visit secondary to blood in stool  HPI:  Pt is a 83 y.o. female seen today for an acute visit for  Small amount of blood in stool.  She does have a history of dementia and recurrent vomiting which is stabilized as well as hypertension and osteoporosis.  She is also has some weight loss which has stabilized in recent days.  Apparently nursing staff noted some bloody streaks in her stool today I'm following up on this.  It appears she does have some history of hemorrhoids in the past and has seen GI.  Also has a history of persistent vomiting which has improved somewhat she is on Zofran and this been assessed by GI as well.  Apparently this morning staff noted some possible bloody streaks in her stool and I'm following up on this she is not complaining of any abdominal discomfort at times will complain of a queasy stomach but when I saw her this afternoon said that have resolved    Past Medical History:    Diagnosis Date  . Asthma   . Dementia   . Diabetes mellitus   . H. pylori infection MAR 2014  . Hypertension   . Stroke [facility])    Past Surgical History:  Procedure Laterality Date  . CHOLECYSTECTOMY    . COLECTOMY     secondary to diverticulitis  . ESOPHAGOGASTRODUODENOSCOPY (EGD) WITH ESOPHAGEAL DILATION N/A 01/07/2013   QIO:NGEXBMWU ring was found at the gastroesophageal junction/SINGLE DUODENAL DIVERTICULUM    No Known Allergies  Outpatient Encounter Prescriptions as of 03/26/2017  Medication Sig  . aspirin EC 81 MG tablet Take 81 mg by mouth daily.  [name] [place]-Castor Oil (VENELEX) OINT Apply to sacrum and bilateral buttocks q shift & prn every shift  . Calcium Carbonate-Vitamin D (CALCIUM 500/VITAMIN D PO) Take 500 mg by mouth 2 (two) times daily.  . citalopram (CELEXA) 20 MG tablet Take 20 mg by mouth daily.   . clotrimazole-betamethasone (LOTRISONE) cream Apply to affected area of bottom every shift  . fluticasone (FLONASE) 50 MCG/ACT nasal spray Place 1 spray into both nostrils 2 (two) times daily as needed for rhinitis.  Marland Kitchen ipratropium-albuterol (DUONEB) 0.5-2.5 (3) MG/3ML SOLN Take 3 mLs by nebulization 2 (two) times daily. And may give every 6 hours as needed prn  . lisinopril (PRINIVIL,ZESTRIL) 20 MG tablet Take 20 mg by mouth daily.   Marland Kitchen loratadine (CLARITIN) 10 MG tablet Take 10 mg by mouth daily.  . memantine (NAMENDA) 10 MG tablet Take 10 mg by mouth 2 (two) times daily.  . Menthol, Topical Analgesic, (  BIOFREEZE) 4 % GEL Apply prn to knees for pain management as needed daily  . mirtazapine (REMERON) 15 MG tablet Take 15 mg by mouth at bedtime.  . Nutritional Supplements (ENSURE CLEAR PO) Give ensure clear ( apple ) daily due to decreased appetite twice a day  . omeprazole (PRILOSEC) 40 MG capsule Take 40 mg by mouth 2 (two) times daily.  . ondansetron (ZOFRAN) 4 MG tablet Give 4 mg by mouth four times a day  . potassium chloride (K-DUR,KLOR-CON) 10 MEQ tablet Take  20 mEq by mouth daily.    No facility-administered encounter medications on file as of 03/26/2017.     Review of Systems   This is limited by dementia but she is not really complaining of any abdominal pain nausea or vomiting-does not complain of shortness of breath although she does have a history of pneumonia cough appears to be improved.  Does not complain of any fever chills or increased weakness beyond baseline  Immunization History  Administered Date(s) Administered  . Influenza-Unspecified 07/20/2016  . Pneumococcal-Unspecified 07/24/2016   Pertinent  Health Maintenance Due  Topic Date Due  . HEMOGLOBIN A1C  03-03-1932  . FOOT EXAM  06/03/1942  . OPHTHALMOLOGY EXAM  06[birth year]03[birth year]1944  . URINE MICROALBUMIN  06/03/1944  . DEXA SCAN  06/04/1999  . INFLUENZA VACCINE  05/16/2017  . PNA vac Low Risk Adult (2 of 2 - PCV13) 07/24/2017   No flowsheet data found. Functional Status Survey:    Vitals:   03/26/17 1322  BP: 132/74  Pulse: 78  Resp: 20  Temp: 97.4 F (36.3 C)  TempSrc: Oral  SpO2: 100%  Weight isn't 110.8 pounds  Physical Exam In general this is a somewhat frail elderly female in no distress here somewhat frail  Her skin is warm and dry.  Eyes pupils appear reactive light visual acuity appears grossly intact sclera and conjunctiva are clear.  Oropharynx is clear mucous membranes moist.  Chest is clear to auscultation with somewhat shallow air entry at could not really appreciate any congestion there is no labored breathing.  Heart is regular rhythm with frequent irregular beats --she does not have significant lower extremity edema I could not appreciate any murmur gallop or rub.  Her abdomen is soft nontender with positive bowel sounds.  Rectal-she does have some external hemorrhoids present I did do a digital occult  blood testing she had minimal stool but it dd iguaic  positive although this could very well be from some bleeding from the  hemorrhoids  Muscle skeletal  Is able to move all her extremities at baseline I do not note any strength deficits different than her baseline she does have diffuse arthritic changes which are not new.  Neurologic is grossly intact I could not really appreciate lateralizing findings her speech is clear. --moves all extremities at baseline strength cranial nerves appear grossly intact  Psych she is oriented to self does follow simple verbal commands. she is pleasant and responds appropriately to simple questions   Labs reviewed:  Recent Labs  06/26/16 0627  09/01/16 0700  11/22/16 0715 12/14/16 0730  03/02/17 1500 03/05/17 0730 03/08/17 0700  NA 137  < > 138  < > 140 140  < > 138 140 138  K 3.9  < > 4.1  < > 4.5 3.8  < > 3.4* 4.9 4.3  CL 101  < > 104  < > 102 105  < > 101 102 103  CO2 30  < > 28  < >  30 28  < > 30 31 28   GLUCOSE 109*  < > 99  < > 93 87  < > 114* 87 101*  BUN 18  < > 14  < > 11 6  < > 9 9 16   CREATININE 0.63  < > 0.74  < > 0.81 0.69  < > 0.77 0.82 0.85  CALCIUM 8.5*  < > 8.8*  < > 8.9 8.7*  < > 9.3 9.1 8.5*  MG 2.0  --  1.9  --   --  1.7  --   --   --   --   PHOS  --   --   --   --  3.5 3.2  --   --   --   --   < > = values in this interval not displayed.  Recent Labs  01/05/17 1600 01/08/17 0700 03/02/17 1500  AST 24 20 28   ALT 19 14 13*  ALKPHOS 81 66 60  BILITOT 0.2* 0.5 0.5  PROT 7.8 6.2* 6.8  ALBUMIN 3.5 2.9* 3.7    Recent Labs  01/06/17 0750 01/08/17 0700 03/02/17 1500  WBC 7.7 6.5 6.5  NEUTROABS 5.1 4.6 3.3  HGB 11.0* 11.0* 14.1  HCT 33.5* 34.0* 42.8  MCV 94.6 96.3 94.1  PLT 370 340 208   Lab Results  Component Value Date   TSH 0.778 03/02/2017   No results found for: HGBA1C No results found for: CHOL, HDL, LDLCALC, LDLDIRECT, TRIG, CHOLHDL  Significant Diagnostic Results in last 30 days:  No results found.  Assessment/Plan #1 positive bloodin stool---this could be I suspect from hemorrhoids- does not have any gross rectal  bleeding-and a second bowel movement today did not have any bloody streaks.  At this point will monitor will guaic  stools 3.  Also will update a CBC and CMP tomorrow.  Also will write a when necessary Anusol order for hemorrhoids.  Her hemoglobin has been stable actually was improved at 14.1 on lab done last month again will update this.  At this point I will suspect this is from a bleeding hemorrhoid but will monitor  [phone]

## 2017-03-27 ENCOUNTER — Encounter (HOSPITAL_COMMUNITY)
Admission: RE | Admit: 2017-03-27 | Discharge: 2017-03-27 | Disposition: A | Discharge status: Home / Self Care | Payer: Medicare Other | Source: Skilled Nursing Facility | Attending: Internal Medicine | Admitting: Internal Medicine

## 2017-03-27 DIAGNOSIS — Z5189 Encounter for other specified aftercare: Secondary | ICD-10-CM | POA: Diagnosis present

## 2017-03-27 DIAGNOSIS — E119 Type 2 diabetes mellitus without complications: Secondary | ICD-10-CM | POA: Diagnosis present

## 2017-03-27 DIAGNOSIS — F0390 Unspecified dementia without behavioral disturbance: Secondary | ICD-10-CM | POA: Insufficient documentation

## 2017-03-27 DIAGNOSIS — M810 Age-related osteoporosis without current pathological fracture: Secondary | ICD-10-CM | POA: Insufficient documentation

## 2017-03-27 DIAGNOSIS — F329 Major depressive disorder, single episode, unspecified: Secondary | ICD-10-CM | POA: Insufficient documentation

## 2017-03-27 LAB — CBC WITH DIFFERENTIAL/PLATELET
BASOS ABS: 0 10*3/uL (ref 0.0–0.1)
Basophils Relative: 1 %
Eosinophils Absolute: 0.5 10*3/uL (ref 0.0–0.7)
Eosinophils Relative: 7 %
HEMATOCRIT: 38.7 % (ref 36.0–46.0)
HEMOGLOBIN: 12.6 g/dL (ref 12.0–15.0)
Lymphocytes Relative: 22 %
Lymphs Abs: 1.5 10*3/uL (ref 0.7–4.0)
MCH: 30 pg (ref 26.0–34.0)
MCHC: 32.6 g/dL (ref 30.0–36.0)
MCV: 92.1 fL (ref 78.0–100.0)
MONO ABS: 0.7 10*3/uL (ref 0.1–1.0)
MONOS PCT: 10 %
NEUTROS ABS: 4.1 10*3/uL (ref 1.7–7.7)
NEUTROS PCT: 60 %
Platelets: 322 10*3/uL (ref 150–400)
RBC: 4.2 MIL/uL (ref 3.87–5.11)
RDW: 13.9 % (ref 11.5–15.5)
WBC: 6.8 10*3/uL (ref 4.0–10.5)

## 2017-03-27 LAB — COMPREHENSIVE METABOLIC PANEL
ALBUMIN: 3 g/dL — AB (ref 3.5–5.0)
ALT: 11 U/L — ABNORMAL LOW (ref 14–54)
ANION GAP: 9 (ref 5–15)
AST: 16 U/L (ref 15–41)
Alkaline Phosphatase: 64 U/L (ref 38–126)
BUN: 22 mg/dL — ABNORMAL HIGH (ref 6–20)
CO2: 29 mmol/L (ref 22–32)
Calcium: 8.5 mg/dL — ABNORMAL LOW (ref 8.9–10.3)
Chloride: 99 mmol/L — ABNORMAL LOW (ref 101–111)
Creatinine, Ser: 0.88 mg/dL (ref 0.44–1.00)
GFR calc Af Amer: >60 mL/min (ref >60)
GFR calc non Af Amer: 60 mL/min — ABNORMAL LOW (ref >60)
GLUCOSE: 96 mg/dL (ref 65–99)
POTASSIUM: 4.1 mmol/L (ref 3.5–5.1)
SODIUM: 137 mmol/L (ref 135–145)
TOTAL PROTEIN: 6.6 g/dL (ref 6.5–8.1)
Total Bilirubin: 0.5 mg/dL (ref 0.3–1.2)

## 2017-03-28 LAB — HEMOGLOBIN A1C
Hgb A1c MFr Bld: 6.4 % — ABNORMAL HIGH (ref 4.8–5.6)
MEAN PLASMA GLUCOSE: 137 mg/dL

## 2017-03-30 ENCOUNTER — Other Ambulatory Visit (HOSPITAL_COMMUNITY)
Admission: RE | Admit: 2017-03-30 | Discharge: 2017-03-30 | Disposition: A | Discharge status: Home / Self Care | Payer: Medicare Other | Source: Skilled Nursing Facility | Attending: Pediatrics | Admitting: Pediatrics

## 2017-03-30 DIAGNOSIS — R279 Unspecified lack of coordination: Secondary | ICD-10-CM | POA: Insufficient documentation

## 2017-03-30 DIAGNOSIS — M810 Age-related osteoporosis without current pathological fracture: Secondary | ICD-10-CM | POA: Diagnosis present

## 2017-03-30 DIAGNOSIS — M6281 Muscle weakness (generalized): Secondary | ICD-10-CM | POA: Insufficient documentation

## 2017-03-30 DIAGNOSIS — F0390 Unspecified dementia without behavioral disturbance: Secondary | ICD-10-CM | POA: Insufficient documentation

## 2017-03-30 DIAGNOSIS — Z5189 Encounter for other specified aftercare: Secondary | ICD-10-CM | POA: Insufficient documentation

## 2017-03-30 LAB — OCCULT BLOOD X 1 CARD TO LAB, STOOL: Fecal Occult Bld: NEGATIVE

## 2017-04-01 ENCOUNTER — Encounter (HOSPITAL_COMMUNITY)
Admission: AD | Admit: 2017-04-01 | Discharge: 2017-04-01 | Disposition: A | Discharge status: Home / Self Care | Payer: Medicare Other | Source: Skilled Nursing Facility

## 2017-04-01 DIAGNOSIS — E119 Type 2 diabetes mellitus without complications: Secondary | ICD-10-CM | POA: Diagnosis not present

## 2017-04-01 LAB — OCCULT BLOOD X 1 CARD TO LAB, STOOL: Fecal Occult Bld: NEGATIVE

## 2017-04-03 ENCOUNTER — Encounter (HOSPITAL_COMMUNITY)
Admission: RE | Admit: 2017-04-03 | Discharge: 2017-04-03 | Disposition: A | Discharge status: Home / Self Care | Payer: Medicare Other | Source: Skilled Nursing Facility | Attending: Internal Medicine | Admitting: Internal Medicine

## 2017-04-03 DIAGNOSIS — M810 Age-related osteoporosis without current pathological fracture: Secondary | ICD-10-CM | POA: Diagnosis present

## 2017-04-03 DIAGNOSIS — E119 Type 2 diabetes mellitus without complications: Secondary | ICD-10-CM | POA: Insufficient documentation

## 2017-04-03 DIAGNOSIS — Z5189 Encounter for other specified aftercare: Secondary | ICD-10-CM | POA: Insufficient documentation

## 2017-04-03 DIAGNOSIS — F0390 Unspecified dementia without behavioral disturbance: Secondary | ICD-10-CM | POA: Insufficient documentation

## 2017-04-03 DIAGNOSIS — F329 Major depressive disorder, single episode, unspecified: Secondary | ICD-10-CM | POA: Insufficient documentation

## 2017-04-03 LAB — CBC WITH DIFFERENTIAL/PLATELET
BASOS ABS: 0.1 10*3/uL (ref 0.0–0.1)
Basophils Relative: 1 %
EOS ABS: 0.4 10*3/uL (ref 0.0–0.7)
EOS PCT: 6 %
HCT: 37.7 % (ref 36.0–46.0)
HEMOGLOBIN: 12.1 g/dL (ref 12.0–15.0)
LYMPHS PCT: 28 %
Lymphs Abs: 1.8 10*3/uL (ref 0.7–4.0)
MCH: 29.8 pg (ref 26.0–34.0)
MCHC: 32.1 g/dL (ref 30.0–36.0)
MCV: 92.9 fL (ref 78.0–100.0)
Monocytes Absolute: 0.8 10*3/uL (ref 0.1–1.0)
Monocytes Relative: 12 %
NEUTROS PCT: 53 %
Neutro Abs: 3.6 10*3/uL (ref 1.7–7.7)
PLATELETS: 244 10*3/uL (ref 150–400)
RBC: 4.06 MIL/uL (ref 3.87–5.11)
RDW: 14.4 % (ref 11.5–15.5)
WBC: 6.7 10*3/uL (ref 4.0–10.5)

## 2017-04-03 LAB — BASIC METABOLIC PANEL
ANION GAP: 7 (ref 5–15)
BUN: 14 mg/dL (ref 6–20)
CO2: 29 mmol/L (ref 22–32)
Calcium: 8.6 mg/dL — ABNORMAL LOW (ref 8.9–10.3)
Chloride: 102 mmol/L (ref 101–111)
Creatinine, Ser: 0.94 mg/dL (ref 0.44–1.00)
GFR calc Af Amer: >60 mL/min (ref >60)
GFR, EST NON AFRICAN AMERICAN: 55 mL/min — AB (ref >60)
Glucose, Bld: 91 mg/dL (ref 65–99)
POTASSIUM: 4.6 mmol/L (ref 3.5–5.1)
SODIUM: 138 mmol/L (ref 135–145)

## 2017-04-12 ENCOUNTER — Non-Acute Institutional Stay (SKILLED_NURSING_FACILITY): Payer: Medicare Other | Admitting: Internal Medicine

## 2017-04-12 ENCOUNTER — Encounter: Payer: Self-pay | Admitting: Internal Medicine

## 2017-04-12 DIAGNOSIS — M810 Age-related osteoporosis without current pathological fracture: Secondary | ICD-10-CM | POA: Diagnosis not present

## 2017-04-12 DIAGNOSIS — K224 Dyskinesia of esophagus: Secondary | ICD-10-CM

## 2017-04-12 DIAGNOSIS — I10 Essential (primary) hypertension: Secondary | ICD-10-CM | POA: Diagnosis not present

## 2017-04-12 DIAGNOSIS — F0390 Unspecified dementia without behavioral disturbance: Secondary | ICD-10-CM

## 2017-04-12 NOTE — Assessment & Plan Note (Signed)
T score -3.67 On Prolia

## 2017-04-12 NOTE — Progress Notes (Signed)
Location:   [facility] Room Number: 115/W Place of Service:  SNF [phone]) Provider:  Lenon CurtAnjali,Gupta  Gupta, Freddie BreechAnjali L, MD  Patient Care Team: Mahlon GammonGupta, Anjali L, MD as PCP - General (Internal Medicine) Synetta ShadowLassen, Arlo C, PA-C as Physician Assistant (Internal Medicine)  Extended Emergency Contact Information Primary Emergency Contact: [name],[name]  [place] Home Phone: [phone] Relation: Son Secondary Emergency Contact: [name],[name]  [place] Home Phone: [phone] Mobile Phone: [phone] Relation: Son  Code Status:  Full Code Goals of care: Advanced Directive information Advanced Directives 04/12/2017  Does Patient Have a Medical Advance Directive? Yes  Type of Advance Directive (No Data)  Does patient want to make changes to medical advance directive? No - Patient declined  Copy of Healthcare Power of Attorney in Chart? -  Pre-existing out of facility DNR order (yellow form or pink MOST form) -     Chief Complaint  Patient presents with  . Medical Management of Chronic Issues    Routine Visit    HPI:  Pt is a 83 y.o. female seen today for medical management of chronic diseases.    Patient has H/O Hypertension, recurrent Vomiting, Falls, GERD and Dementia. And Osteoporosis  Patient is doing well in facility. There are no Nursing issues. She has gained weight and is now up to 119 lBs up from 116. Her appetite is good and her Vomiting is controlled with Scheduled Zofran Patietns mood is better also. She is more interactive and she talks with the staff more now. She is not having any behavior issues. Patient did have some rectal blood and it was due to her Hemorrhoids. She has been Guaiac Negative since then and her Hgb is stable.  Past Medical History:  Diagnosis Date  . Asthma   . Dementia   . Diabetes mellitus   . H. pylori infection MAR 2014  . Hypertension   . Stroke [facility](HCC)    Past Surgical History:    Procedure Laterality Date  . CHOLECYSTECTOMY    . COLECTOMY     secondary to diverticulitis  . ESOPHAGOGASTRODUODENOSCOPY (EGD) WITH ESOPHAGEAL DILATION N/A 01/07/2013   [id number]:Schatzki ring was found at the gastroesophageal junction/SINGLE DUODENAL DIVERTICULUM    No Known Allergies  Outpatient Encounter Prescriptions as of 04/12/2017  Medication Sig  . aspirin EC 81 MG tablet Take 81 mg by mouth daily.  [name]. Balsam [place]-Castor Oil (VENELEX) OINT Apply to sacrum and bilateral buttocks q shift & prn every shift  . Calcium Carbonate-Vitamin D (CALCIUM 500/VITAMIN D PO) Take 500 mg by mouth 2 (two) times daily.  . citalopram (CELEXA) 20 MG tablet Take 20 mg by mouth daily.   . clotrimazole-betamethasone (LOTRISONE) cream Apply to affected area of bottom every shift  . fluticasone (FLONASE) 50 MCG/ACT nasal spray Place 1 spray into both nostrils 2 (two) times daily as needed for rhinitis.  . hydrocortisone (ANUSOL-HC) 2.5 % rectal cream Place 1 application rectally 3 (three) times daily.  Marland Kitchen. ipratropium-albuterol (DUONEB) 0.5-2.5 (3) MG/3ML SOLN Give every 6 hours as needed prn for SOB and Wheezing  . lisinopril (PRINIVIL,ZESTRIL) 20 MG tablet Take 20 mg by mouth daily.   Marland Kitchen. loratadine (CLARITIN) 10 MG tablet Take 10 mg by mouth daily.  . memantine (NAMENDA) 10 MG tablet Take 10 mg by mouth 2 (two) times daily.  . Menthol, Topical Analgesic, (BIOFREEZE) 4 % GEL Apply prn to knees for pain management as needed daily  . mirtazapine (REMERON) 15 MG tablet Take  15 mg by mouth at bedtime.  . Nutritional Supplements (ENSURE CLEAR PO) Give ensure clear ( apple ) daily due to decreased appetite twice a day  . omeprazole (PRILOSEC) 40 MG capsule Take 40 mg by mouth 2 (two) times daily.  . ondansetron (ZOFRAN) 4 MG tablet Give 4 mg by mouth four times a day  . potassium chloride (K-DUR,KLOR-CON) 10 MEQ tablet Take 20 mEq by mouth daily.    No facility-administered encounter medications on file as of  04/12/2017.     Review of Systems  Unable to perform ROS: Dementia    Immunization History  Administered Date(s) Administered  . Influenza-Unspecified 07/20/2016  . Pneumococcal-Unspecified 07/24/2016   Pertinent  Health Maintenance Due  Topic Date Due  . FOOT EXAM  06/03/1942  . OPHTHALMOLOGY EXAM  06/03/1942  . URINE MICROALBUMIN  06/03/1942  . DEXA SCAN  06/04/1999  . INFLUENZA VACCINE  05/16/2017  . PNA vac Low Risk Adult (2 of 2 - PCV13) 07/24/2017  . HEMOGLOBIN A1C  09/26/2017   No flowsheet data found. Functional Status Survey:    Vitals:   04/12/17 1100  BP: 104/68  Pulse: 68  Resp: 20  Temp: 97.6 F (36.4 C)  TempSrc: Oral  SpO2: 95%   There is no height or weight on file to calculate BMI. Physical Exam  Constitutional: She appears well-developed and well-nourished.  HENT:  Head: Normocephalic.  Mouth/Throat: Oropharynx is clear and moist.  Eyes: Pupils are equal, round, and reactive to light.  Neck: Neck supple.  Cardiovascular: Normal rate, regular rhythm and normal heart sounds.   Pulmonary/Chest: Effort normal and breath sounds normal. No respiratory distress. She has no wheezes. She has no rales.  Abdominal: Soft. Bowel sounds are normal. She exhibits no distension. There is no tenderness. There is no rebound.  Musculoskeletal: She exhibits no edema.  Neurological: She is alert.  Skin: Skin is warm and dry.  Psychiatric: She has a normal mood and affect. Her behavior is normal.   Labs reviewed:  Recent Labs  06/26/16 0627  09/01/16 0700  11/22/16 0715 12/14/16 0730  03/08/17 0700 03/27/17 0741 04/03/17 0700  NA 137  < > 138  < > 140 140  < > 138 137 138  K 3.9  < > 4.1  < > 4.5 3.8  < > 4.3 4.1 4.6  CL 101  < > 104  < > 102 105  < > 103 99* 102  CO2 30  < > 28  < > 30 28  < > 28 29 29   GLUCOSE 109*  < > 99  < > 93 87  < > 101* 96 91  BUN 18  < > 14  < > 11 6  < > 16 22* 14  CREATININE 0.63  < > 0.74  < > 0.81 0.69  < > 0.85 0.88 0.94    CALCIUM 8.5*  < > 8.8*  < > 8.9 8.7*  < > 8.5* 8.5* 8.6*  MG 2.0  --  1.9  --   --  1.7  --   --   --   --   PHOS  --   --   --   --  3.5 3.2  --   --   --   --   < > = values in this interval not displayed.  Recent Labs  01/08/17 0700 03/02/17 1500 03/27/17 0741  AST 20 28 16   ALT 14 13* 11*  ALKPHOS 66  60 64  BILITOT 0.5 0.5 0.5  PROT 6.2* 6.8 6.6  ALBUMIN 2.9* 3.7 3.0*    Recent Labs  03/02/17 1500 03/27/17 0741 04/03/17 0700  WBC 6.5 6.8 6.7  NEUTROABS 3.3 4.1 3.6  HGB 14.1 12.6 12.1  HCT 42.8 38.7 37.7  MCV 94.1 92.1 92.9  PLT 208 322 244   Lab Results  Component Value Date   TSH 0.778 03/02/2017   Lab Results  Component Value Date   HGBA1C 6.4 (H) 03/27/2017   No results found for: CHOL, HDL, LDLCALC, LDLDIRECT, TRIG, CHOLHDL  Significant Diagnostic Results in last 30 days:  No results found.  Assessment/Plan  Hypertension BP stable on Lisinopril. Metoprolol d/ced due to Bradycardia  GERD  Stable with Prilosec and Zofran before each meal Depression Continue Remeron and Celexa Patient doing very well with both these meds.  Osteoporosis T score -3.67 Continue Prolia Dementia Stable on Namenda Allergic rhinitis Continue Claritin  Family/ staff Communication:   Labs/tests ordered:   Labs were reviewed.

## 2017-04-20 ENCOUNTER — Encounter: Payer: Self-pay | Admitting: Internal Medicine

## 2017-04-20 ENCOUNTER — Non-Acute Institutional Stay (SKILLED_NURSING_FACILITY): Payer: Medicare Other | Admitting: Internal Medicine

## 2017-04-20 DIAGNOSIS — R635 Abnormal weight gain: Secondary | ICD-10-CM | POA: Diagnosis not present

## 2017-04-20 NOTE — Progress Notes (Signed)
Location:   [facility] Room Number: 115/W Place of Service:  SNF [phone](31) Provider:  [name]  Gupta, Anjali L, MD  Patient Care Team: Mahlon GammonGupta, Anjali L, MD as PCP - General (Internal Medicine) Synetta ShadowLassen, Avyukt Cimo C, PA-C as Physician Assistant (Internal Medicine)  Extended Emergency Contact Information Primary Emergency Contact: Sabrina [name],Sabrina [name]  [place] Home Phone: [phone] Relation: Son Secondary Emergency Contact: Sabrina [name],[name]  [place] Home Phone: [phone] Mobile Phone: [phone] Relation: Son  Code Status:  Full Code Goals of care: Advanced Directive information Advanced Directives 04/20/2017  Does Patient Have a Medical Advance Directive? Yes  Type of Advance Directive (No Data)  Does patient want to make changes to medical advance directive? No - Patient declined  Copy of Healthcare Power of Attorney in Chart? -  Pre-existing out of facility DNR order (yellow form or pink MOST form) -     Chief Complaint  Patient presents with  . Acute Visit    Weight Gain    HPI:  Pt is a 83 y.o. female seen today for an acute visit for Follow-up of weight gain-most current weight is 122 pounds appears back in April it was 114 and actually dropped down to 110 and then has consistently risen now at 122-however it appears back on 10/16/2016 her weight was 119 pounds-it appears there has been some fluctuation.  Apparently her appetite is been somewhat sporadic but has increased somewhat recently and she is doing better with speech therapy's help with taking her supplements-.  She is not complaining of any pain shortness of breath she does not have increased edema or evidence of overt CHF.  She appears to be essentially at her baseline does have a history of dementia but is pleasant smiling denies any issues at this time.     Past Medical History:  Diagnosis Date  . Asthma   . Dementia   . Diabetes mellitus   . H. pylori  infection MAR 2014  . Hypertension   . Stroke [facility](HCC)    Past Surgical History:  Procedure Laterality Date  . CHOLECYSTECTOMY    . COLECTOMY     secondary to diverticulitis  . ESOPHAGOGASTRODUODENOSCOPY (EGD) WITH ESOPHAGEAL DILATION N/A 01/07/2013   [id number]:Schatzki ring was found at the gastroesophageal junction/SINGLE DUODENAL DIVERTICULUM    No Known Allergies  Outpatient Encounter Prescriptions as of 04/20/2017  Medication Sig  . aspirin EC 81 MG tablet Take 81 mg by mouth daily.  [name]. Balsam [place]-Castor Oil (VENELEX) OINT Apply to sacrum and bilateral buttocks q shift & prn every shift  . Calcium Carbonate-Vitamin D (CALCIUM 500/VITAMIN D PO) Take 500 mg by mouth 2 (two) times daily.  . citalopram (CELEXA) 20 MG tablet Take 20 mg by mouth daily.   . fluticasone (FLONASE) 50 MCG/ACT nasal spray Place 1 spray into both nostrils 2 (two) times daily as needed for rhinitis.  . hydrocortisone (ANUSOL-HC) 2.5 % rectal cream Place 1 application rectally 3 (three) times daily.  Marland Kitchen. ipratropium-albuterol (DUONEB) 0.5-2.5 (3) MG/3ML SOLN Give every 6 hours as needed prn for SOB and Wheezing  . lisinopril (PRINIVIL,ZESTRIL) 20 MG tablet Take 20 mg by mouth daily.   Marland Kitchen. loratadine (CLARITIN) 10 MG tablet Take 10 mg by mouth daily.  . memantine (NAMENDA) 10 MG tablet Take 10 mg by mouth 2 (two) times daily.  . Menthol, Topical Analgesic, (BIOFREEZE) 4 % GEL Apply prn to knees for pain management as needed daily  . mirtazapine (REMERON) 15  MG tablet Take 15 mg by mouth at bedtime.  Marland Kitchen omeprazole (PRILOSEC) 40 MG capsule Take 40 mg by mouth 2 (two) times daily.  . ondansetron (ZOFRAN) 4 MG tablet Give 4 mg by mouth four times a day  . potassium chloride (K-DUR,KLOR-CON) 10 MEQ tablet Take 20 mEq by mouth daily.   . [DISCONTINUED] clotrimazole-betamethasone (LOTRISONE) cream Apply to affected area of bottom every shift  . [DISCONTINUED] Nutritional Supplements (ENSURE CLEAR PO) Give ensure clear ( apple ) daily  due to decreased appetite twice a day   No facility-administered encounter medications on file as of 04/20/2017.     Review of Systems   Limited secondary to dementia please see history of present illness and she is not really complaining of any shortness of breath chest pain apparently is eating somewhat better with speech therapy spell  Immunization History  Administered Date(s) Administered  . Influenza-Unspecified 07/20/2016  . Pneumococcal-Unspecified 07/24/2016   Pertinent  Health Maintenance Due  Topic Date Due  . FOOT EXAM  06/03/1942  . OPHTHALMOLOGY EXAM  06/03/1942  . URINE MICROALBUMIN  06/03/1942  . DEXA SCAN  06/04/1999  . INFLUENZA VACCINE  05/16/2017  . PNA vac Low Risk Adult (2 of 2 - PCV13) 07/24/2017  . HEMOGLOBIN A1C  09/26/2017   No flowsheet data found. Functional Status Survey:    Vitals:   04/20/17 1426  BP: 119/68  Pulse: 73  Resp: 16  Temp: 98 F (36.7 C)  TempSrc: Oral  SpO2: 96%  Weight is 122 pounds  Physical Exam In general this is a somewhat frail elderly female in no distress--  Her skin is warm and dry.  Eyes pupils appear reactive light visual acuity appears grossly intact sclera and conjunctiva are clear.  Oropharynx is clear mucous membranes moist.  Chest is clear to auscultation with somewhat shallow air entry at could not really appreciate any congestion there is no labored breathing.  Heart is regular rhythm with frequent irregular beats and rate  --she does not have significant lower extremity edema I could not appreciate any murmur gallop or rub.  Her abdomen is soft nontender with positive bowel sounds.  Muscle skeletal  Is able to move all her extremities at baseline I do not note any strength deficits different than her baseline she does have diffuse arthritic changes which are not new.  Neurologic is grossly intact I could not really appreciate lateralizing findings her speech is clear. --moves all  extremities at baseline strength cranial nerves appear grossly intact  Psych she is oriented to self does follow simple verbal commands. she is pleasant and responds appropriately to simple questions   Labs reviewed:  Recent Labs  06/26/16 0627  09/01/16 0700  11/22/16 0715 12/14/16 0730  03/08/17 0700 03/27/17 0741 04/03/17 0700  NA 137  < > 138  < > 140 140  < > 138 137 138  K 3.9  < > 4.1  < > 4.5 3.8  < > 4.3 4.1 4.6  CL 101  < > 104  < > 102 105  < > 103 99* 102  CO2 30  < > 28  < > 30 28  < > 28 29 29   GLUCOSE 109*  < > 99  < > 93 87  < > 101* 96 91  BUN 18  < > 14  < > 11 6  < > 16 22* 14  CREATININE 0.63  < > 0.74  < > 0.81 0.69  < >  0.85 0.88 0.94  CALCIUM 8.5*  < > 8.8*  < > 8.9 8.7*  < > 8.5* 8.5* 8.6*  MG 2.0  --  1.9  --   --  1.7  --   --   --   --   PHOS  --   --   --   --  3.5 3.2  --   --   --   --   < > = values in this interval not displayed.  Recent Labs  01/08/17 0700 03/02/17 1500 03/27/17 0741  AST 20 28 16   ALT 14 13* 11*  ALKPHOS 66 60 64  BILITOT 0.5 0.5 0.5  PROT 6.2* 6.8 6.6  ALBUMIN 2.9* 3.7 3.0*    Recent Labs  03/02/17 1500 03/27/17 0741 04/03/17 0700  WBC 6.5 6.8 6.7  NEUTROABS 3.3 4.1 3.6  HGB 14.1 12.6 12.1  HCT 42.8 38.7 37.7  MCV 94.1 92.1 92.9  PLT 208 322 244   Lab Results  Component Value Date   TSH 0.778 03/02/2017   Lab Results  Component Value Date   HGBA1C 6.4 (H) 03/27/2017   No results found for: CHOL, HDL, LDLCALC, LDLDIRECT, TRIG, CHOLHDL  Significant Diagnostic Results in last 30 days:  No results found.  Assessment/Plan  1 weight gain-clinically she appears to be stable I do not see evidence of increased edema chest congestion or concerns for congestive heart failure but will update a BNP-I do not see that she has a significant CHF history per review and appendectomy.  Also will check a metabolic panel to check her albumin level as well as renal issues although again I suspect the likelihood here  is fairly low-per discussion with nursing it appears this is probably more appetite related eating better she does have a history of GERD and has an order for Zofran which has helped with recent vomiting episodes which apparently has improved resulting apparently in somewhat ibetter appetite She also continues on Prilosec.  She is also on Remeron although I believe this is more so for depression  At this point will monitor clinically and await updated labs including a metabolic panel albumin level and BNP.  [id number]--UY note greater than 25 minutes spent assessing patient-reviewing her chart-her last-discussing her status with nursing-and coordinating and formulating plan of care-of note greater than 50% of time spent coordinating plan of care with input note above

## 2017-04-23 ENCOUNTER — Encounter (HOSPITAL_COMMUNITY)
Admission: RE | Admit: 2017-04-23 | Discharge: 2017-04-23 | Disposition: A | Discharge status: Home / Self Care | Payer: Medicare Other | Source: Skilled Nursing Facility | Attending: Internal Medicine | Admitting: Internal Medicine

## 2017-04-23 DIAGNOSIS — Z5189 Encounter for other specified aftercare: Secondary | ICD-10-CM | POA: Diagnosis present

## 2017-04-23 DIAGNOSIS — I10 Essential (primary) hypertension: Secondary | ICD-10-CM | POA: Insufficient documentation

## 2017-04-23 DIAGNOSIS — F0390 Unspecified dementia without behavioral disturbance: Secondary | ICD-10-CM | POA: Insufficient documentation

## 2017-04-23 DIAGNOSIS — M810 Age-related osteoporosis without current pathological fracture: Secondary | ICD-10-CM | POA: Diagnosis present

## 2017-04-23 DIAGNOSIS — N390 Urinary tract infection, site not specified: Secondary | ICD-10-CM | POA: Diagnosis present

## 2017-04-23 LAB — COMPREHENSIVE METABOLIC PANEL
ALBUMIN: 3.1 g/dL — AB (ref 3.5–5.0)
ALT: 10 U/L — ABNORMAL LOW (ref 14–54)
AST: 18 U/L (ref 15–41)
Alkaline Phosphatase: 53 U/L (ref 38–126)
Anion gap: 7 (ref 5–15)
BILIRUBIN TOTAL: 0.5 mg/dL (ref 0.3–1.2)
BUN: 18 mg/dL (ref 6–20)
CO2: 30 mmol/L (ref 22–32)
CREATININE: 0.83 mg/dL (ref 0.44–1.00)
Calcium: 8.8 mg/dL — ABNORMAL LOW (ref 8.9–10.3)
Chloride: 103 mmol/L (ref 101–111)
GFR calc Af Amer: >60 mL/min (ref >60)
GFR calc non Af Amer: >60 mL/min (ref >60)
Glucose, Bld: 76 mg/dL (ref 65–99)
Potassium: 4 mmol/L (ref 3.5–5.1)
Sodium: 140 mmol/L (ref 135–145)
TOTAL PROTEIN: 6.5 g/dL (ref 6.5–8.1)

## 2017-04-23 LAB — BRAIN NATRIURETIC PEPTIDE: B Natriuretic Peptide: 154 pg/mL — ABNORMAL HIGH (ref 0.0–100.0)

## 2017-05-03 ENCOUNTER — Non-Acute Institutional Stay (SKILLED_NURSING_FACILITY): Payer: Medicare Other | Admitting: Internal Medicine

## 2017-05-03 ENCOUNTER — Encounter: Payer: Self-pay | Admitting: Internal Medicine

## 2017-05-03 DIAGNOSIS — F0390 Unspecified dementia without behavioral disturbance: Secondary | ICD-10-CM | POA: Diagnosis not present

## 2017-05-03 DIAGNOSIS — E876 Hypokalemia: Secondary | ICD-10-CM | POA: Diagnosis not present

## 2017-05-03 DIAGNOSIS — I10 Essential (primary) hypertension: Secondary | ICD-10-CM | POA: Diagnosis not present

## 2017-05-03 DIAGNOSIS — R112 Nausea with vomiting, unspecified: Secondary | ICD-10-CM | POA: Diagnosis not present

## 2017-05-03 DIAGNOSIS — R635 Abnormal weight gain: Secondary | ICD-10-CM | POA: Diagnosis not present

## 2017-05-03 DIAGNOSIS — M810 Age-related osteoporosis without current pathological fracture: Secondary | ICD-10-CM | POA: Diagnosis not present

## 2017-05-03 DIAGNOSIS — K625 Hemorrhage of anus and rectum: Secondary | ICD-10-CM

## 2017-05-03 NOTE — Progress Notes (Signed)
Location:    [facility] Room Number: 115/W Place of Service:  SNF (31) Provider:  Lynnell DikeLassen,Elysha Daw, PA-C  Gupta, Anjali L, MD  Patient Care Team: Mahlon GammonGupta, Anjali L, MD as PCP - General (Internal Medicine) Synetta ShadowLassen, Fionnuala Hemmerich C, PA-C as Physician Assistant (Internal Medicine)  Extended Emergency Contact Information Primary Emergency Contact: [name],[name]  [place] Home Phone: [phone] Relation: Son Secondary Emergency Contact: [name],[name]  [place] Home Phone: [phone] Mobile Phone: [phone] Relation: Son  Code Status:  Full Code Goals of care: Advanced Directive information Advanced Directives 05/03/2017  Does Patient Have a Medical Advance Directive? Yes  Type of Advance Directive (No Data)  Does patient want to make changes to medical advance directive? No - Patient declined  Copy of Healthcare Power of Attorney in Chart? -  Pre-existing out of facility DNR order (yellow form or pink MOST form) -    Chief complaint-routine visit for medical management of chronic medical conditions including dementia-hypertension-history of vomiting-GERD-osteoporosis-depression-allergic rhinitis.     HPI:  Pt is a 83 y.o. female seen today for medical management of chronic diseases.  As noted above.  Clinically she appears to be stable she has gained weight this appears to be more appetite related she has had a history of recurrent vomiting which has improved with the Zofran before meals she is also on Prilosec with a history of GERD.  In regards to dementia she continues on Namenda as she appears to be stable in this regard she is eating better is bright alert smiling does not talk much but that is not new this appears to have stabilized.  She is also a history of hypertension on lisinopril metoprolol was discontinued because of bradycardia blood pressures appear relatively stable weight is 120/62 today previous readings 119/68-the  highest one that I see is 147/77 but this is not common.  #Osteoporosis she is been started on Prolia secondary to a T score 3.67.  Currently she is sitting in her wheelchair comfortably has no complaints continues to be pleasant smiling cooperative-nursing staff has not noted any behaviors   Past Medical History:  Diagnosis Date  . Asthma   . Dementia   . Diabetes mellitus   . H. pylori infection MAR 2014  . Hypertension   . Stroke [facility](HCC)    Past Surgical History:  Procedure Laterality Date  . CHOLECYSTECTOMY    . COLECTOMY     secondary to diverticulitis  . ESOPHAGOGASTRODUODENOSCOPY (EGD) WITH ESOPHAGEAL DILATION N/A 01/07/2013   VFI:[id number]:Schatzki ring was found at the gastroesophageal junction/SINGLE DUODENAL DIVERTICULUM    No Known Allergies  Outpatient Encounter Prescriptions as of 05/03/2017  Medication Sig  . aspirin EC 81 MG tablet Take 81 mg by mouth daily.  [name]. Balsam [place]-Castor Oil (VENELEX) OINT Apply to sacrum and bilateral buttocks q shift & prn every shift  . Calcium Carbonate-Vitamin D (CALCIUM 500/VITAMIN D PO) Take 500 mg by mouth 2 (two) times daily.  . citalopram (CELEXA) 20 MG tablet Take 20 mg by mouth daily.   . fluticasone (FLONASE) 50 MCG/ACT nasal spray Place 1 spray into both nostrils 2 (two) times daily as needed for rhinitis.  . hydrocortisone (ANUSOL-HC) 2.5 % rectal cream Place 1 application rectally 3 (three) times daily.  Marland Kitchen. ipratropium-albuterol (DUONEB) 0.5-2.5 (3) MG/3ML SOLN Give every 6 hours as needed prn for SOB and Wheezing  . lisinopril (PRINIVIL,ZESTRIL) 20 MG tablet Take 20 mg by mouth daily.   Marland Kitchen. loratadine (CLARITIN) 10 MG  tablet Take 10 mg by mouth daily.  . memantine (NAMENDA) 10 MG tablet Take 10 mg by mouth 2 (two) times daily.  . Menthol, Topical Analgesic, (BIOFREEZE) 4 % GEL Apply prn to knees for pain management as needed daily  . mirtazapine (REMERON) 15 MG tablet Take 15 mg by mouth at bedtime.  Marland Kitchen omeprazole (PRILOSEC) 40 MG  capsule Take 40 mg by mouth 2 (two) times daily.  . ondansetron (ZOFRAN) 4 MG tablet Give 4 mg by mouth four times a day  . potassium chloride (K-DUR,KLOR-CON) 10 MEQ tablet Take 20 mEq by mouth daily.    No facility-administered encounter medications on file as of 05/03/2017.      Review of Systems  Essentially unattainable secondary to dementia please see history of present illness   Immunization History  Administered Date(s) Administered  . Influenza-Unspecified 07/20/2016  . Pneumococcal-Unspecified 07/24/2016   Pertinent  Health Maintenance Due  Topic Date Due  . OPHTHALMOLOGY EXAM  05/16/2017 (Originally 06/03/1942)  . URINE MICROALBUMIN  05/16/2017 (Originally 06/03/1942)  . DEXA SCAN  05/16/2017 (Originally 06/04/1999)  . INFLUENZA VACCINE  05/16/2017  . PNA vac Low Risk Adult (2 of 2 - PCV13) 07/24/2017  . HEMOGLOBIN A1C  09/26/2017  . FOOT EXAM  02/20/2018   No flowsheet data found. Functional Status Survey:    Vitals:   05/03/17 1531  BP: 120/62  Pulse: 81  Resp: 18  Temp: 97.6 F (36.4 C)  TempSrc: Oral   Weight is 123.4 l approximately month ago it was 119.2 Physical Exam   In general this is a somewhat frail elderly female in no distress--sitting comfortably in her wheelchair  Her skin is warm and dry.  Eyes pupils appear reactive light visual acuity appears grossly intact sclera and conjunctiva are clear.  Oropharynx is clear mucous membranes moist.  Chest is clear to auscultation with somewhat shallow air entry at could not really appreciate any congestion there is no labored breathing.  Heart is regular rhythmwith occasional irregular beats --she does not have significant lower extremity edema I could not appreciate any murmur gallop or rub.  Her abdomen is soft nontender with positive bowel sounds.  Muscle skeletal  Is able to move all her extremities at baseline I do not note any strength deficits different than her baseline she does  have diffuse arthritic changes which are not new.  Neurologic is grossly intact I could not really appreciate lateralizing findings her speech is clear. --moves all extremities at baseline strength cranial nerves appear grossly intact  Psych she is oriented to self does follow simple verbal commands.she is pleasant and responds appropriately to simple questions continues to be pleasant smiling  Labs reviewed:  Recent Labs  06/26/16 0627  09/01/16 0700  11/22/16 0715 12/14/16 0730  03/27/17 0741 04/03/17 0700 04/23/17 0300  NA 137  < > 138  < > 140 140  < > 137 138 140  K 3.9  < > 4.1  < > 4.5 3.8  < > 4.1 4.6 4.0  CL 101  < > 104  < > 102 105  < > 99* 102 103  CO2 30  < > 28  < > 30 28  < > 29 29 30   GLUCOSE 109*  < > 99  < > 93 87  < > 96 91 76  BUN 18  < > 14  < > 11 6  < > 22* 14 18  CREATININE 0.63  < > 0.74  < >  0.81 0.69  < > 0.88 0.94 0.83  CALCIUM 8.5*  < > 8.8*  < > 8.9 8.7*  < > 8.5* 8.6* 8.8*  MG 2.0  --  1.9  --   --  1.7  --   --   --   --   PHOS  --   --   --   --  3.5 3.2  --   --   --   --   < > = values in this interval not displayed.  Recent Labs  03/02/17 1500 03/27/17 0741 04/23/17 0300  AST 28 16 18   ALT 13* 11* 10*  ALKPHOS 60 64 53  BILITOT 0.5 0.5 0.5  PROT 6.8 6.6 6.5  ALBUMIN 3.7 3.0* 3.1*    Recent Labs  03/02/17 1500 03/27/17 0741 04/03/17 0700  WBC 6.5 6.8 6.7  NEUTROABS 3.3 4.1 3.6  HGB 14.1 12.6 12.1  HCT 42.8 38.7 37.7  MCV 94.1 92.1 92.9  PLT 208 322 244   Lab Results  Component Value Date   TSH 0.778 03/02/2017   Lab Results  Component Value Date   HGBA1C 6.4 (H) 03/27/2017   No results found for: CHOL, HDL, LDLCALC, LDLDIRECT, TRIG, CHOLHDL  Significant Diagnostic Results in last 30 days:  No results found.  Assessment/Plan  #1 history of dementia this appears stable on Namenda she is eating better weight actually is going up which I feel is desirable.  #2 history weight gain this is been fairly  significant over the past several months BNP was done which was unremarkable at 154 I suspect this is appetite related   #3 history of recurrent vomiting this has improved with Zofran before meals she is also on Prilosec with associated GERD I suspect this may be adding to her weight gain which is desirable.  #4 history of hypertension continues on lisinopril as noted above this appears stable-metoprolol has been discontinued because of bradycardia concerns.  #5 history-osteoporosis she has been started on  Prolia.  #6-history of depression this appears to have stabilized he is on Remeron I suspect as well for appetite stimulation is also on Celexa.  #7 history allergic rhinitis appears to be stable on Claritan  #8-history of rectal bleeding thought most likely due to hemorrhoids-occult blood testing has been negative hemoglobin is been relatively stable at around 12-14  per chart review will update this to ensure stability.  #9 history of questionable diabetes hemoglobin A1c is been unremarkable at 6.4   #10 history of hypokalemia sh has been on p otassium secondar to hitory fhypokalemia in the past will update a metabolic panel--per chart review this has been stable now for some time which is encouraging  Per chart review magnesium levels have been within normal range  Again will update a CBC to keep an eye on her hemoglobin also a metabolic panel to update electrolytes and renal function  [id number]-of note greater than 35 minutes spent assessing patient-reviewing chart-reviewing lab history-discussing patient's status with nursing staff-and coordinating and formulating a plan of care for numerous diagnoses-of note greater than 50% of time spent coordinating plan of care with input as noted above  --

## 2017-05-07 ENCOUNTER — Encounter (HOSPITAL_COMMUNITY)
Admission: RE | Admit: 2017-05-07 | Discharge: 2017-05-07 | Disposition: A | Discharge status: Home / Self Care | Payer: Medicare Other | Source: Skilled Nursing Facility | Attending: Internal Medicine | Admitting: Internal Medicine

## 2017-05-07 DIAGNOSIS — I10 Essential (primary) hypertension: Secondary | ICD-10-CM | POA: Diagnosis not present

## 2017-05-07 LAB — CBC WITH DIFFERENTIAL/PLATELET
BASOS ABS: 0 10*3/uL (ref 0.0–0.1)
Basophils Relative: 1 %
Eosinophils Absolute: 0.4 10*3/uL (ref 0.0–0.7)
Eosinophils Relative: 6 %
HCT: 36.3 % (ref 36.0–46.0)
Hemoglobin: 11.7 g/dL — ABNORMAL LOW (ref 12.0–15.0)
LYMPHS PCT: 34 %
Lymphs Abs: 2.1 10*3/uL (ref 0.7–4.0)
MCH: 30.4 pg (ref 26.0–34.0)
MCHC: 32.2 g/dL (ref 30.0–36.0)
MCV: 94.3 fL (ref 78.0–100.0)
Monocytes Absolute: 0.8 10*3/uL (ref 0.1–1.0)
Monocytes Relative: 12 %
Neutro Abs: 2.9 10*3/uL (ref 1.7–7.7)
Neutrophils Relative %: 47 %
PLATELETS: 223 10*3/uL (ref 150–400)
RBC: 3.85 MIL/uL — AB (ref 3.87–5.11)
RDW: 15.1 % (ref 11.5–15.5)
WBC: 6.2 10*3/uL (ref 4.0–10.5)

## 2017-05-07 LAB — BASIC METABOLIC PANEL
Anion gap: 7 (ref 5–15)
BUN: 18 mg/dL (ref 6–20)
CO2: 30 mmol/L (ref 22–32)
Calcium: 9 mg/dL (ref 8.9–10.3)
Chloride: 102 mmol/L (ref 101–111)
Creatinine, Ser: 0.82 mg/dL (ref 0.44–1.00)
GFR calc Af Amer: >60 mL/min (ref >60)
Glucose, Bld: 78 mg/dL (ref 65–99)
POTASSIUM: 4.1 mmol/L (ref 3.5–5.1)
Sodium: 139 mmol/L (ref 135–145)

## 2017-05-08 NOTE — Progress Notes (Signed)
This encounter was created in error - please disregard.

## 2017-05-10 ENCOUNTER — Non-Acute Institutional Stay (SKILLED_NURSING_FACILITY): Payer: Medicare Other | Admitting: Internal Medicine

## 2017-05-10 ENCOUNTER — Encounter: Payer: Self-pay | Admitting: Internal Medicine

## 2017-05-10 DIAGNOSIS — F0390 Unspecified dementia without behavioral disturbance: Secondary | ICD-10-CM | POA: Diagnosis not present

## 2017-05-10 DIAGNOSIS — R51 Headache: Secondary | ICD-10-CM | POA: Diagnosis not present

## 2017-05-10 DIAGNOSIS — G8929 Other chronic pain: Secondary | ICD-10-CM

## 2017-05-10 DIAGNOSIS — R519 Headache, unspecified: Secondary | ICD-10-CM

## 2017-05-10 NOTE — Progress Notes (Signed)
Location:   [facility] Room Number: 115/W Place of Service:  SNF [phone]) Provider:  Sabino DickArlo Shamecca Whitebread  Gupta, Anjali L, MD  Patient Care Team: Mahlon GammonGupta, Anjali L, MD as PCP - General (Internal Medicine) Synetta ShadowLassen, Edmund Holcomb C, PA-C as Physician Assistant (Internal Medicine)  Extended Emergency Contact Information Primary Emergency Contact: [name],[name]  [place] Home Phone: [phone] Relation: Son Secondary Emergency Contact: [name],[name]  [place] Home Phone: [phone] Mobile Phone: [phone] Relation: Son  Code Status:  Full Code Goals of care: Advanced Directive information Advanced Directives 05/10/2017  Does Patient Have a Medical Advance Directive? Yes  Type of Advance Directive (No Data)  Does patient want to make changes to medical advance directive? No - Patient declined  Copy of Healthcare Power of Attorney in Chart? -  Pre-existing out of facility DNR order (yellow form or pink MOST form) -     Chief Complaint  Patient presents with  . Acute Visit    Headaches    HPI:  Pt is a 83 y.o. female seen today for an acute visit for Complaints of headaches.  Apparently patient has a history of headaches and family has noted she complains at times of this.  Patient is a poor historian secondary to dementia but she says she's had headaches for an extended period of time and is not new-she describes this more as a frontal headache that radiates to the top of her scalp.  She denies really any other associated symptoms like visual changes.  She has not complained apparently to nursing staff these headaches.  I do not see she has anything needed for pain we will add this.  Per chart review she did have a CT of her head back in June 2014 which showed atrophy without hydrocephalus it did show small vessel disease  She also had an MRI of her head in March 2014--that did not show any major vessel occlusion or correctable  proximal stenosis.  Showed possible distal vessel atherosclerosis--  Her other medical issues appear to be stable including dementia hypertension she has history of vomiting which is improving she's actually had some weight gain-also history of osteoporosis depression and allergic rhinitis    Past Medical History:  Diagnosis Date  . Asthma   . Dementia   . Diabetes mellitus   . H. pylori infection MAR 2014  . Hypertension   . Stroke [facility](HCC)    Past Surgical History:  Procedure Laterality Date  . CHOLECYSTECTOMY    . COLECTOMY     secondary to diverticulitis  . ESOPHAGOGASTRODUODENOSCOPY (EGD) WITH ESOPHAGEAL DILATION N/A 01/07/2013   [id number]:Schatzki ring was found at the gastroesophageal junction/SINGLE DUODENAL DIVERTICULUM    No Known Allergies  Outpatient Encounter Prescriptions as of 05/10/2017  Medication Sig  . aspirin EC 81 MG tablet Take 81 mg by mouth daily.  [name]. Balsam [place]-Castor Oil (VENELEX) OINT Apply to sacrum and bilateral buttocks q shift & prn every shift  . Calcium Carbonate-Vitamin D (CALCIUM 500/VITAMIN D PO) Take 500 mg by mouth 2 (two) times daily.  . citalopram (CELEXA) 20 MG tablet Take 20 mg by mouth daily.   . fluticasone (FLONASE) 50 MCG/ACT nasal spray Place 1 spray into both nostrils 2 (two) times daily as needed for rhinitis.  . hydrocortisone (ANUSOL-HC) 2.5 % rectal cream Place 1 application rectally 3 (three) times daily.  Marland Kitchen. ipratropium-albuterol (DUONEB) 0.5-2.5 (3) MG/3ML SOLN Give every 6 hours as needed prn for SOB and Wheezing  .  lisinopril (PRINIVIL,ZESTRIL) 20 MG tablet Take 20 mg by mouth daily.   Marland Kitchen. loratadine (CLARITIN) 10 MG tablet Take 10 mg by mouth daily.  . memantine (NAMENDA) 10 MG tablet Take 10 mg by mouth 2 (two) times daily.  . Menthol, Topical Analgesic, (BIOFREEZE) 4 % GEL Apply prn to knees for pain management as needed daily  . mirtazapine (REMERON) 15 MG tablet Take 15 mg by mouth at bedtime.  Marland Kitchen. omeprazole (PRILOSEC) 40 MG  capsule Take 40 mg by mouth 2 (two) times daily.  . ondansetron (ZOFRAN) 4 MG tablet Give 4 mg by mouth four times a day  . potassium chloride (K-DUR,KLOR-CON) 10 MEQ tablet Take 20 mEq by mouth daily.    No facility-administered encounter medications on file as of 05/10/2017.     Review of Systems   Limited secondary to dementia please see history of present illness-again she is not really complaining of any headache at this time but says occasionally she will have a frontal headache-she is not complaining of this at times to nursing staff but apparently has complained family-she does not complain of any associated visual changes the vomiting does not appear related to this-and has improved with the addition of Zofran  Immunization History  Administered Date(s) Administered  . Influenza-Unspecified 07/20/2016  . Pneumococcal-Unspecified 07/24/2016   Pertinent  Health Maintenance Due  Topic Date Due  . OPHTHALMOLOGY EXAM  05/16/2017 (Originally 06/03/1942)  . URINE MICROALBUMIN  05/16/2017 (Originally 06/03/1942)  . DEXA SCAN  05/16/2017 (Originally 06/04/1999)  . INFLUENZA VACCINE  05/16/2017  . PNA vac Low Risk Adult (2 of 2 - PCV13) 07/24/2017  . HEMOGLOBIN A1C  09/26/2017  . FOOT EXAM  02/20/2018   No flowsheet data found. Functional Status Survey:    Vitals:   05/10/17 1422  BP: 126/88  Pulse: 84  Resp: 18  She is afebrile  Physical Exam In general this is a somewhat frail elderly female in no distress--sitting comfortably in her wheelchair  Her skin is warm and dry.  Eyes pupils appear reactive light visual acuity appears grossly intact sclera and conjunctiva are clear.  Oropharynx is clear mucous membranes moist.  Chest is clear to auscultation with somewhat shallow air entry at could not really appreciate any congestion there is no labored breathing.  Heart is regular rhythmwith occasional irregular beats --she does not have significant lower extremity  edema I could not appreciate any murmur gallop or rub.  Her abdomen is soft nontender with positive bowel sounds.  Muscle skeletal  Is able to move all her extremities at baseline I do not note any strength deficits different than her baseline she does have diffuse arthritic changes which are not new she has good grip strength.  Neurologic is grossly intact I could not really appreciate lateralizing findings her speech is clear. --moves all extremities at baseline strength cranial nerves appear grossly intact-  Psych she is oriented to self does follow simple verbal commands.she is pleasant and responds appropriately to simple questions continues to be pleasant smiling Labs reviewed:  Recent Labs  06/26/16 0627  09/01/16 0700  11/22/16 0715 12/14/16 0730  04/03/17 0700 04/23/17 0300 05/07/17 0600  NA 137  < > 138  < > 140 140  < > 138 140 139  K 3.9  < > 4.1  < > 4.5 3.8  < > 4.6 4.0 4.1  CL 101  < > 104  < > 102 105  < > 102 103 102  CO2 30  < >  28  < > 30 28  < > 29 30 30   GLUCOSE 109*  < > 99  < > 93 87  < > 91 76 78  BUN 18  < > 14  < > 11 6  < > 14 18 18   CREATININE 0.63  < > 0.74  < > 0.81 0.69  < > 0.94 0.83 0.82  CALCIUM 8.5*  < > 8.8*  < > 8.9 8.7*  < > 8.6* 8.8* 9.0  MG 2.0  --  1.9  --   --  1.7  --   --   --   --   PHOS  --   --   --   --  3.5 3.2  --   --   --   --   < > = values in this interval not displayed.  Recent Labs  03/02/17 1500 03/27/17 0741 04/23/17 0300  AST 28 16 18   ALT 13* 11* 10*  ALKPHOS 60 64 53  BILITOT 0.5 0.5 0.5  PROT 6.8 6.6 6.5  ALBUMIN 3.7 3.0* 3.1*    Recent Labs  03/27/17 0741 04/03/17 0700 05/07/17 0600  WBC 6.8 6.7 6.2  NEUTROABS 4.1 3.6 2.9  HGB 12.6 12.1 11.7*  HCT 38.7 37.7 36.3  MCV 92.1 92.9 94.3  PLT 322 244 223   Lab Results  Component Value Date   TSH 0.778 03/02/2017   Lab Results  Component Value Date   HGBA1C 6.4 (H) 03/27/2017   No results found for: CHOL, HDL, LDLCALC, LDLDIRECT, TRIG,  CHOLHDL  Significant Diagnostic Results in last 30 days:  No results found.  Assessment/Plan  #1 headaches-apparently this has been chronic and not really worsening-patient does have dementia but I asked her to please let nursing note when she has these so we may get a better history of the severity and frequency of these -I do not see that she has anything when necessary for pain will add Tylenol every 6 hours.  Also spoke with nursing to keep an eye on this again they say she has not complained of headaches.  Clinically appears to be doing well her vomiting episodes have improved apparently she is eating more and has gained weight.  Recent labs were unremarkable metabolic panel was unremarkable.   [id number] no greater than 25 minutes spent assessing patient-discussing her status with nursing staff-reviewing her chart-reviewing her labs-and investigational studies-and coordinating plan of care of note greater than 50% of time spent coordinating plan of care with input as noted above

## 2017-05-20 IMAGING — RF DG UGI W/ KUB
9 of 10 series · 14 of 15 positions shown · non-contrast
Comparison: None.

CLINICAL DATA: Patient with primary complaints of dysphagia and
vomiting. History of dementia.

EXAM:
UPPER GI SERIES WITH KUB
TECHNIQUE: After obtaining a scout radiograph a routine upper GI series was
performed using thin barium.
FLUOROSCOPY TIME:  Fluoroscopy Time:  1 minutes and 24 seconds
Radiation Exposure Index (if provided by the fluoroscopic device):
678.31 micro Gy per meter squared.
Number of Acquired Spot Images: 9

[Series 1: t abdomen supine · 0.15mm/px · 1 of 1 slices shown]
[im 1/1]
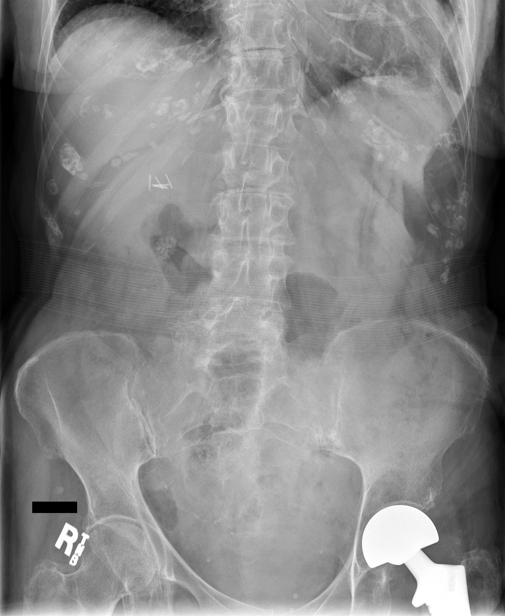

[Series 3: fluoro_barium 2fps_bw · 0.18mm/px · 1 of 1 slices shown (1 of 8)]
[im 1/1]
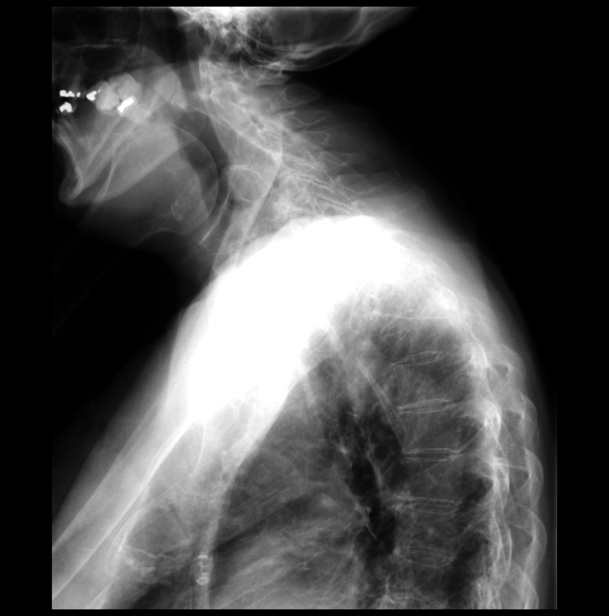

[Series 4: fluoro_barium 2fps_bw · 0.18mm/px · 1 of 1 slices shown (2 of 8)]
[im 1/1]
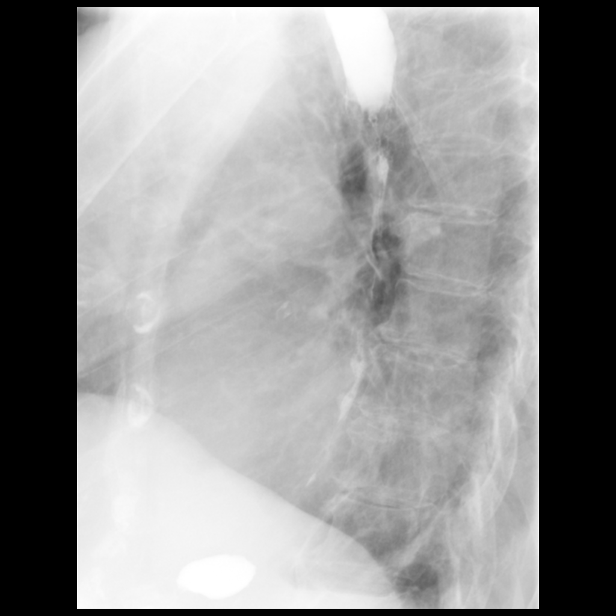

[Series 5: fluoro_barium 2fps_bw · 0.18mm/px · 2 of 2 frames shown (3 of 8)]
[frame 1/2]
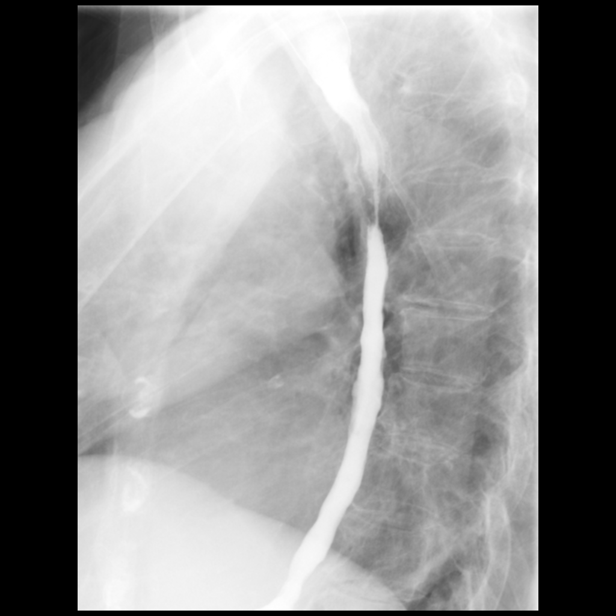
[frame 2/2]
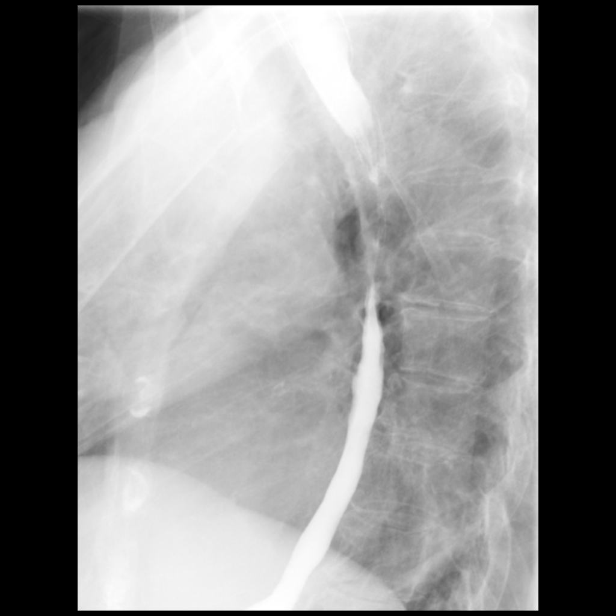

[Series 6: fluoro_barium 2fps_bw · 0.18mm/px · 1 of 1 slices shown (4 of 8)]
[im 1/1]
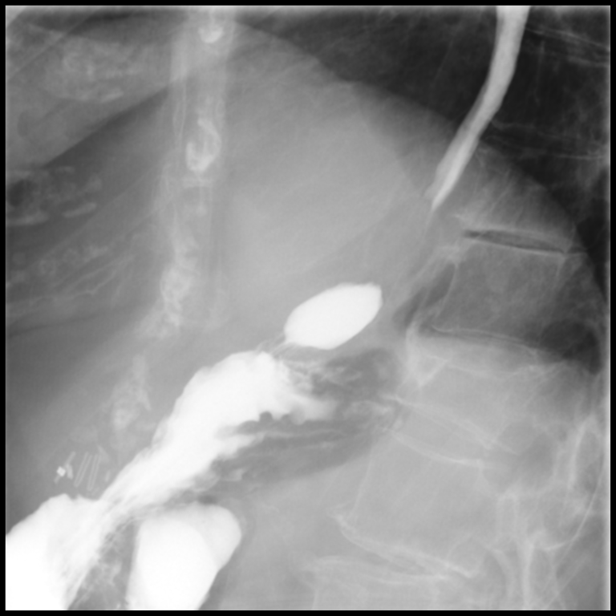

[Series 7: fluoro_barium 2fps_bw · 0.18mm/px · 1 of 1 slices shown (5 of 8)]
[im 1/1]
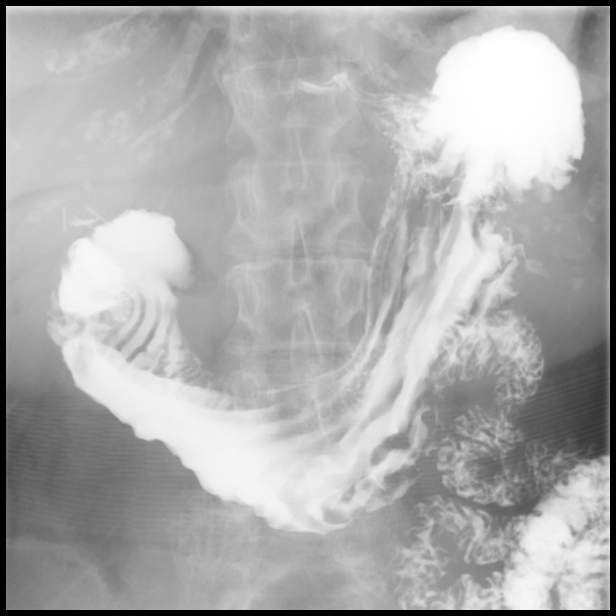

[Series 9: fluoro_barium 2fps_bw · 0.18mm/px · 3 of 3 frames shown (6 of 8)]
[frame 1/3]
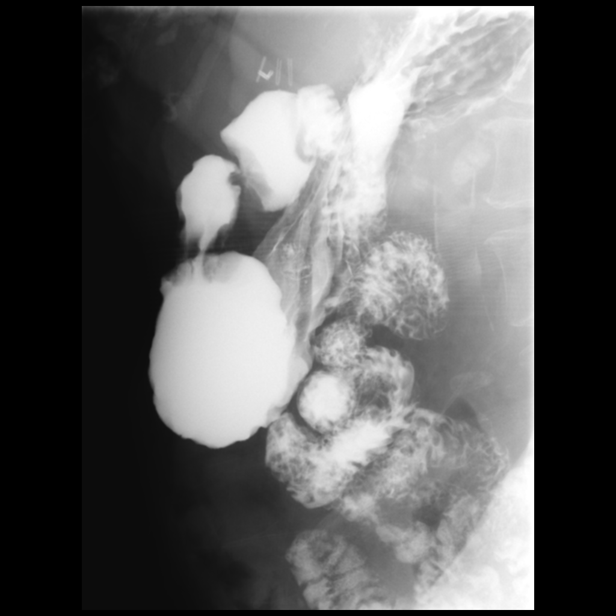
[frame 2/3]
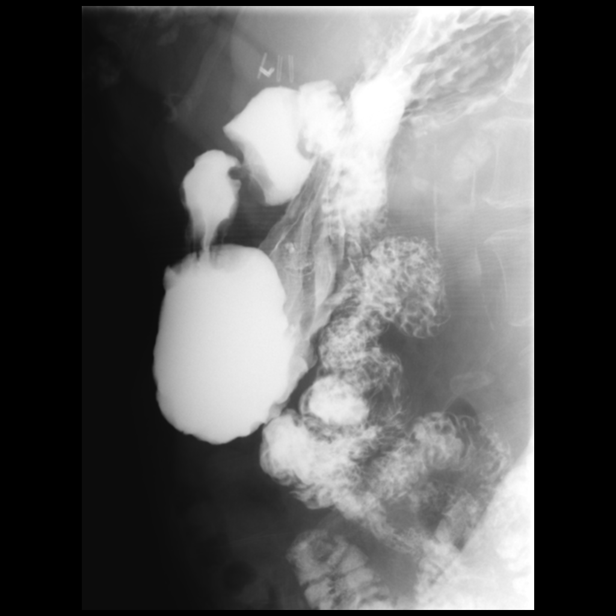
[frame 3/3]
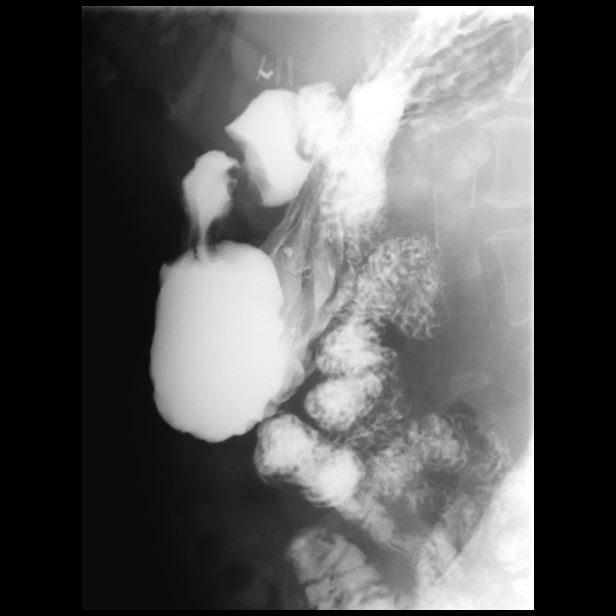

[Series 10: fluoro_barium 2fps_bw · 0.18mm/px · 3 of 4 frames shown (7 of 8)]
[frame 1/4]
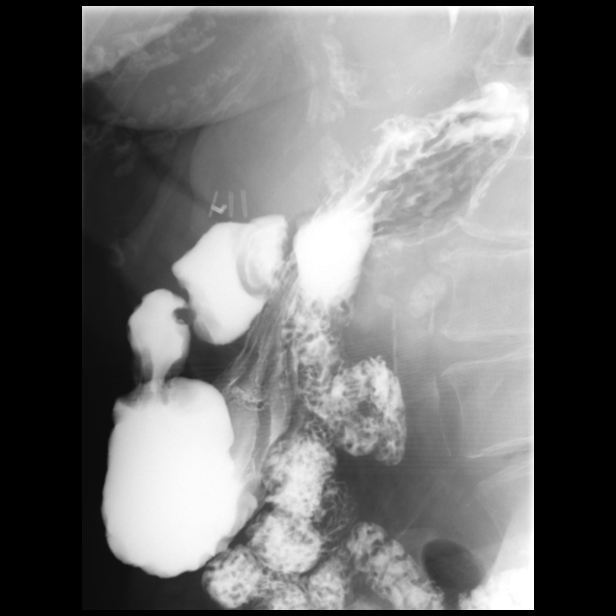
[frame 3/4]
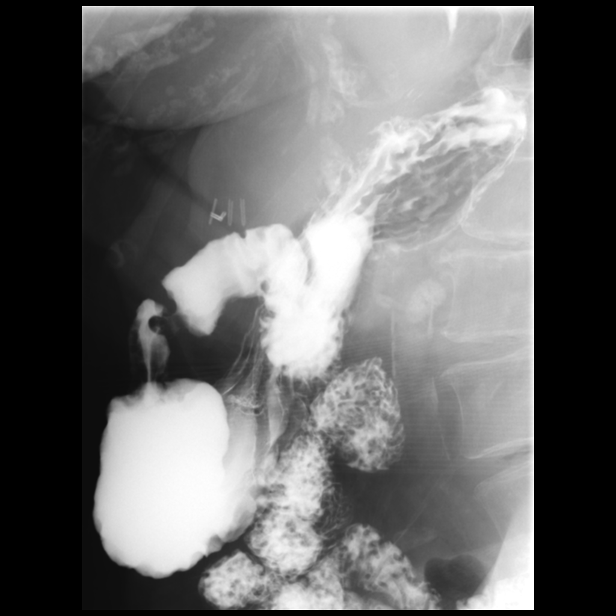
[frame 4/4]
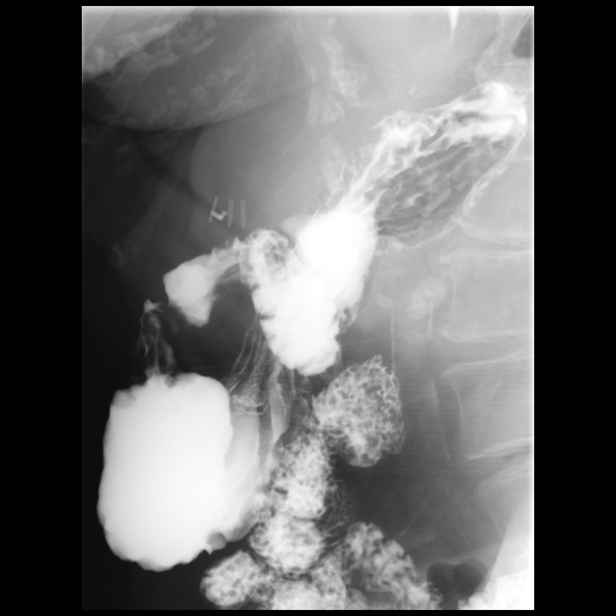

[Series 11: fluoro_barium 2fps_bw · 0.18mm/px · 1 of 1 slices shown (8 of 8)]
[im 1/1]
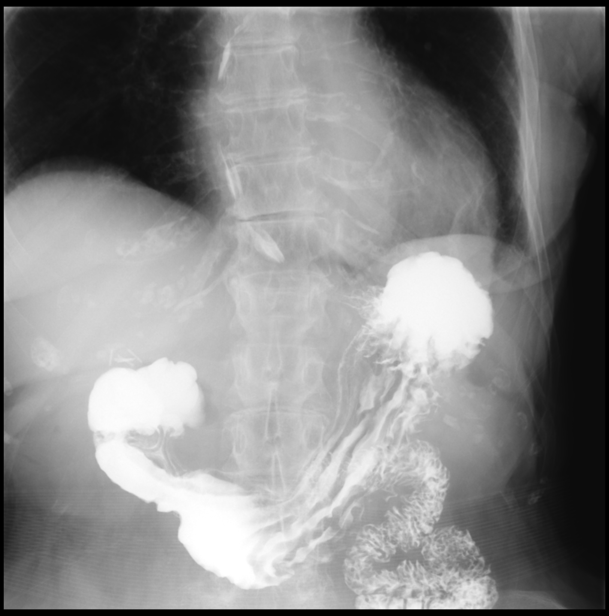

[14 of 15 positions shown; findings below may reference images not displayed]

FINDINGS: Initial abdomen radiographs shows a normal bowel gas pattern. There
changes from a previous cholecystectomy and left hip
hemiarthroplasty.

Pharyngeal swallowing function is unremarkable. There is a prominent
cricopharyngeus muscle indentation on the cervical esophagus.
Remainder of the esophagus is normal in caliber. Esophagus is normal
in course. No esophageal mass, stricture or evidence of
inflammation.

There is esophageal dysmotility with proximal escape and tertiary
contractions. No reflux was documented during the exam.

The stomach is normal in size and shape with a normal mucosal fold
pattern. No stomach mass, erosion or ulceration.

The duodenal bulb and duodenal C sweep are unremarkable.
IMPRESSION: 1. Esophageal dysmotility, which is likely the source of the
patient's dysphasia.
2. No esophageal mass, inflammation or stricture.
3. The stomach and duodenum are unremarkable.

## 2017-05-25 ENCOUNTER — Non-Acute Institutional Stay (SKILLED_NURSING_FACILITY): Payer: Medicare Other | Admitting: Internal Medicine

## 2017-05-25 ENCOUNTER — Inpatient Hospital Stay (HOSPITAL_COMMUNITY): Payer: Medicare Other | Attending: Internal Medicine

## 2017-05-25 DIAGNOSIS — R059 Cough, unspecified: Secondary | ICD-10-CM

## 2017-05-25 DIAGNOSIS — R05 Cough: Secondary | ICD-10-CM

## 2017-05-25 DIAGNOSIS — K219 Gastro-esophageal reflux disease without esophagitis: Secondary | ICD-10-CM | POA: Diagnosis not present

## 2017-05-25 DIAGNOSIS — R1111 Vomiting without nausea: Secondary | ICD-10-CM

## 2017-05-27 NOTE — Progress Notes (Signed)
This is an acute visit.  Level care skilled.  Facility is [facility].  Chief complaint-acute visit secondary to vomiting.  History of present illness.  Patient is a pleasant 83 year old female with a history of dementia-hypertension-GERD with vomiting episodes-osteoporosis and depression.  She does have a history current vomiting which has improved apparently with the Zofran before meals she is also on Prilosec with a history of GERD.  This evening she did have an episode of vomiting while eating her dinner-this appeared to be undigested food-she had a somewhat persistent cough after the episode although she did not really complain of shortness of breath she did not have sign of distress but the cough was concerning.  Vital signs are stable she is afebrile  Past Medical History:  Diagnosis Date  . Asthma   . Dementia   . Diabetes mellitus   . H. pylori infection MAR 2014  . Hypertension   . Stroke [facility])         Past Surgical History:  Procedure Laterality Date  . CHOLECYSTECTOMY    . COLECTOMY     secondary to diverticulitis  . ESOPHAGOGASTRODUODENOSCOPY (EGD) WITH ESOPHAGEAL DILATION N/A 01/07/2013   ZOX:[id number] ring was found at the gastroesophageal junction/SINGLE DUODENAL DIVERTICULUM    No Known Allergies      Outpatient Encounter Prescriptions as of 05/03/2017  Medication Sig  . aspirin EC 81 MG tablet Take 81 mg by mouth daily.  [name] [place]-Castor Oil (VENELEX) OINT Apply to sacrum and bilateral buttocks q shift & prn every shift  . Calcium Carbonate-Vitamin D (CALCIUM 500/VITAMIN D PO) Take 500 mg by mouth 2 (two) times daily.  . citalopram (CELEXA) 20 MG tablet Take 20 mg by mouth daily.   . fluticasone (FLONASE) 50 MCG/ACT nasal spray Place 1 spray into both nostrils 2 (two) times daily as needed for rhinitis.  . hydrocortisone (ANUSOL-HC) 2.5 % rectal cream Place 1 application rectally 3 (three) times daily.  Marland Kitchen ipratropium-albuterol  (DUONEB) 0.5-2.5 (3) MG/3ML SOLN Give every 6 hours as needed prn for SOB and Wheezing  . lisinopril (PRINIVIL,ZESTRIL) 20 MG tablet Take 20 mg by mouth daily.   Marland Kitchen loratadine (CLARITIN) 10 MG tablet Take 10 mg by mouth daily.  . memantine (NAMENDA) 10 MG tablet Take 10 mg by mouth 2 (two) times daily.  . Menthol, Topical Analgesic, (BIOFREEZE) 4 % GEL Apply prn to knees for pain management as needed daily  . mirtazapine (REMERON) 15 MG tablet Take 15 mg by mouth at bedtime.  Marland Kitchen omeprazole (PRILOSEC) 40 MG capsule Take 40 mg by mouth 2 (two) times daily.  . ondansetron (ZOFRAN) 4 MG tablet Give 4 mg by mouth four times a day  . potassium chloride (K-DUR,KLOR-CON) 10 MEQ tablet Take 20 mEq by mouth daily.    No facility-administered encounter medications on file as of 05/03/2017.      Review of Systems  Essentially unattainable secondary to dementia please see history of present illness       Immunization History  Administered Date(s) Administered  . Influenza-Unspecified 07/20/2016  . Pneumococcal-Unspecified 07/24/2016       Pertinent  Health Maintenance Due  Topic Date Due  . OPHTHALMOLOGY EXAM  05/16/2017 (Originally 06/03/1942)  . URINE MICROALBUMIN  05/16/2017 (Originally 06/03/1942)  . DEXA SCAN  05/16/2017 (Originally 06/04/1999)  . INFLUENZA VACCINE  05/16/2017  . PNA vac Low Risk Adult (2 of 2 - PCV13) 07/24/2017  . HEMOGLOBIN A1C  09/26/2017  . FOOT EXAM  02/20/2018  No flowsheet data found. Functional Status Survey:  Review of systems is very limited secondary patient being a poor historian because of dementia-she is not complaining of shortness of breath but does have somewhat of a persistent cough-.  Physical exam.  Temperature is 98.3 pulse 92 respirations 26 blood pressure 158/98.  In general this is a frail elderly female initially when I saw her she was vomiting undigested food did not appear to be in any acute distress but was uncomfortable.  She had  a couple vomiting episodes in quick succession and then subsided but continued to have a cough.  Skin is warm and dry she is not diaphoretic.  Oropharynx is clear.  Chest is clear to auscultation with shallow air entry.  Heart is regular rate and rhythm in the 90s she does not really have significant lower extremity edema.  Abdomen is soft nontender with positive bowel sounds.  Muscle skeletal appears to be at baseline moves all extremities at baseline with some lower extremity weakness which is not new.  Neurologic as noted above no lateralizing findings cranial nerves are grossly intact her speech is clear.  Psych she is oriented to self is pleasant. Somewhat uncomfortable initially but on reevaluation appear to be more comfortable.  Labs.  05/07/2017.  Sodium 139 potassium 4.1 BUN 18 creatinine 0.82.  WBC 6.2 hemoglobin 11.7 platelets 223.  Assessment and plan.  #1 vomiting with cough-the vomiting has subsided I did speak with staff and they say occasionally she was still have vomiting episodes during or after eating but these have improved apparently fairly significantly since Zofran routinely was initiated.  At this point will monitor in regards to that and continue the Zofran.  However coughing was fairly significant after the episode this again appears to have improved on reevaluation she is more comfortable but will order a chest x-ray to rule out aspiration. She does have orders for duo nebs every 6 hours as needed these will need to be encouraged   At this point continue to monitor vital signs every 2 hours 2 and then every shift with pulse ox.  Blood pressure is mildly elevated but I suspect this is due to the vomiting and will have nursing staff recheck this as well.  Clinically again on reevaluation she appears more comfortable no sign of respiratory distress or abdominal discomfort no further vomiting will monitor.  [id number]

## 2017-05-31 ENCOUNTER — Encounter: Payer: Self-pay | Admitting: Internal Medicine

## 2017-05-31 ENCOUNTER — Non-Acute Institutional Stay (SKILLED_NURSING_FACILITY): Payer: Medicare Other | Admitting: Internal Medicine

## 2017-05-31 DIAGNOSIS — K224 Dyskinesia of esophagus: Secondary | ICD-10-CM | POA: Diagnosis not present

## 2017-05-31 DIAGNOSIS — I10 Essential (primary) hypertension: Secondary | ICD-10-CM

## 2017-05-31 DIAGNOSIS — F331 Major depressive disorder, recurrent, moderate: Secondary | ICD-10-CM | POA: Diagnosis not present

## 2017-05-31 DIAGNOSIS — M810 Age-related osteoporosis without current pathological fracture: Secondary | ICD-10-CM | POA: Diagnosis not present

## 2017-05-31 NOTE — Progress Notes (Signed)
Location:   [facility] Room Number: 115/W Place of Service:  SNF [phone]) Provider:  Lenon Curt, Freddie Breech, MD  Patient Care Team: [name], MD as PCP - General (Internal Medicine) Synetta Shadow as Physician Assistant (Internal Medicine)  Extended Emergency Contact Information Primary Emergency Contact: [name] [place] Home Phone: [phone] Relation: Son Secondary Emergency Contact: [name] [place] Home Phone: [phone] Mobile Phone: [phone] Relation: Son  Code Status:  Full Code Goals of care: Advanced Directive information Advanced Directives 05/31/2017  Does Patient Have a Medical Advance Directive? Yes  Type of Advance Directive (No Data)  Does patient want to make changes to medical advance directive? No - Patient declined  Copy of Healthcare Power of Attorney in Chart? -  Pre-existing out of facility DNR order (yellow form or pink MOST form) -     Chief Complaint  Patient presents with  . Medical Management of Chronic Issues    Routine Visit,     HPI:  Pt is a 83 y.o. female seen today for medical management of chronic diseases.    Patient has H/O Hypertension, recurrent Vomiting, Falls, GERD and Dementia. And Osteoporosis  Patient is doing well in facility. She did have a episode of vomiting and Cough. Which is now resolved.She has had no new Nursing issues. Her weight is 123 lbs which has come slowly up from 116 lb in past few months. Patient has been more talkative and smiles now in facility. She still though says she gets depressed sometimes. She did not have any complains.  Past Medical History:  Diagnosis Date  . Asthma   . Dementia   . Diabetes mellitus   . H. pylori infection MAR 2014  . Hypertension   . Stroke [facility])    Past Surgical History:  Procedure Laterality Date  . CHOLECYSTECTOMY    . COLECTOMY     secondary to diverticulitis  .  ESOPHAGOGASTRODUODENOSCOPY (EGD) WITH ESOPHAGEAL DILATION N/A 01/07/2013   QIO:[id number] ring was found at the gastroesophageal junction/SINGLE DUODENAL DIVERTICULUM    No Known Allergies  Outpatient Encounter Prescriptions as of 05/31/2017  Medication Sig  . acetaminophen (TYLENOL) 325 MG tablet Take 650 mg by mouth every 6 (six) hours as needed.  Marland Kitchen aspirin EC 81 MG tablet Take 81 mg by mouth daily.  [name] [place]-Castor Oil (VENELEX) OINT Apply to sacrum and bilateral buttocks q shift & prn every shift  . Calcium Carbonate-Vitamin D (CALCIUM 500/VITAMIN D PO) Take 500 mg by mouth 2 (two) times daily.  . citalopram (CELEXA) 20 MG tablet Take 20 mg by mouth daily.   . fluticasone (FLONASE) 50 MCG/ACT nasal spray Place 1 spray into both nostrils 2 (two) times daily as needed for rhinitis.  . hydrocortisone (ANUSOL-HC) 2.5 % rectal cream Place 1 application rectally 3 (three) times daily as needed.   Marland Kitchen ipratropium-albuterol (DUONEB) 0.5-2.5 (3) MG/3ML SOLN Give every 6 hours as needed prn for SOB and Wheezing  . lisinopril (PRINIVIL,ZESTRIL) 20 MG tablet Take 20 mg by mouth daily.   Marland Kitchen loratadine (CLARITIN) 10 MG tablet Take 10 mg by mouth daily.  . memantine (NAMENDA) 10 MG tablet Take 10 mg by mouth 2 (two) times daily.  . Menthol, Topical Analgesic, (BIOFREEZE) 4 % GEL Apply prn to knees for pain management as needed daily  . mirtazapine (REMERON) 15 MG tablet Take 15 mg by mouth at bedtime.  Marland Kitchen omeprazole (PRILOSEC) 40 MG  capsule Take 40 mg by mouth 2 (two) times daily.  . ondansetron (ZOFRAN) 4 MG tablet Give 4 mg by mouth four times a day  . potassium chloride (K-DUR,KLOR-CON) 10 MEQ tablet Take 20 mEq by mouth daily.    No facility-administered encounter medications on file as of 05/31/2017.      Review of Systems  Unable to perform ROS: Dementia    Immunization History  Administered Date(s) Administered  . Influenza-Unspecified 07/20/2016  . Pneumococcal-Unspecified 07/24/2016    Pertinent  Health Maintenance Due  Topic Date Due  . OPHTHALMOLOGY EXAM  07/01/2017 (Originally 06/03/1942)  . URINE MICROALBUMIN  07/01/2017 (Originally 06/03/1942)  . DEXA SCAN  07/01/2017 (Originally 06/04/1999)  . INFLUENZA VACCINE  09/15/2017 (Originally 05/16/2017)  . PNA vac Low Risk Adult (2 of 2 - PCV13) 07/24/2017  . HEMOGLOBIN A1C  09/26/2017  . FOOT EXAM  02/20/2018   No flowsheet data found. Functional Status Survey:    Vitals:   05/31/17 1208  BP: 110/65  Pulse: 65  Resp: 16  Temp: 98 F (36.7 C)  TempSrc: Oral  SpO2: 96%  Weight: 121 lb 12.8 oz (55.2 kg)  Height: 5\' 5"  (1.651 m)   Body mass index is 20.27 kg/m. Physical Exam  Constitutional: She appears well-developedand well-nourished.  HENT:  Head: Normocephalic.  Mouth/Throat: Oropharynx is clear and moist.  Eyes: Pupils are equal, round, and reactive to light.  Neck: Neck supple.  Cardiovascular: Normal rate, regular rhythmand normal heart sounds.  Pulmonary/Chest: Effort normaland breath sounds normal. No respiratory distress. She has no wheezes. She has no rales.  Abdominal: Soft. Bowel sounds are normal. She exhibits no distension. There is no tenderness. There is no rebound.  Musculoskeletal: She exhibits no edema.  Neurological: She is alert.  Skin: Skin is warmand dry.  Psychiatric: She has a normal mood and affect. Her behavior is normal.    Labs reviewed:  Recent Labs  06/26/16 0627  09/01/16 0700  11/22/16 0715 12/14/16 0730  04/03/17 0700 04/23/17 0300 05/07/17 0600  NA 137  < > 138  < > 140 140  < > 138 140 139  K 3.9  < > 4.1  < > 4.5 3.8  < > 4.6 4.0 4.1  CL 101  < > 104  < > 102 105  < > 102 103 102  CO2 30  < > 28  < > 30 28  < > 29 30 30   GLUCOSE 109*  < > 99  < > 93 87  < > 91 76 78  BUN 18  < > 14  < > 11 6  < > 14 18 18   CREATININE 0.63  < > 0.74  < > 0.81 0.69  < > 0.94 0.83 0.82  CALCIUM 8.5*  < > 8.8*  < > 8.9 8.7*  < > 8.6* 8.8* 9.0  MG 2.0  --  1.9  --   --   1.7  --   --   --   --   PHOS  --   --   --   --  3.5 3.2  --   --   --   --   < > = values in this interval not displayed.  Recent Labs  03/02/17 1500 03/27/17 0741 04/23/17 0300  AST 28 16 18   ALT 13* 11* 10*  ALKPHOS 60 64 53  BILITOT 0.5 0.5 0.5  PROT 6.8 6.6 6.5  ALBUMIN 3.7 3.0* 3.1*  Recent Labs  03/27/17 0741 04/03/17 0700 05/07/17 0600  WBC 6.8 6.7 6.2  NEUTROABS 4.1 3.6 2.9  HGB 12.6 12.1 11.7*  HCT 38.7 37.7 36.3  MCV 92.1 92.9 94.3  PLT 322 244 223   Lab Results  Component Value Date   TSH 0.778 03/02/2017   Lab Results  Component Value Date   HGBA1C 6.4 (H) 03/27/2017   No results found for: CHOL, HDL, LDLCALC, LDLDIRECT, TRIG, CHOLHDL  Significant Diagnostic Results in last 30 days:  Dg Chest 2 View  Result Date: 05/25/2017 CLINICAL DATA:  Vomiting and coughing EXAM: CHEST  2 VIEW COMPARISON:  Chest radiograph 01/06/2017 FINDINGS: Unchanged mild cardiomegaly with chronic interstitial opacities. No pleural effusion or pneumothorax. No focal consolidation. IMPRESSION: COPD without acute cardiopulmonary disease. Electronically Signed   By: Deatra Robinson M.D.   On: 05/25/2017 19:14    Assessment/Plan Hypertension BP stable on Lisinopril. Metoprolol d/ced due to Bradycardia  GERD with recurrent Vomiting Stable with Prilosec and Zofran before each meal  Depression Continue Remeron and Celexa Patient doing very well with both these meds. If her weight continues to increase will consider decreasing the dose of Remeron  Osteoporosis T score -3.67 Continue Prolia Dementia Stable on Namenda Allergic rhinitis Continue Claritin Meds review Will stop her Potassium as she has no more vomiting. Repeat Bmp in 4 weeks  Family/ staff Communication:      Labs/tests order BMP in 4 weeks  Total time spent in this patient care encounter was 25_ minutes; greater than 50% of the visit spent counseling patient, reviewing records , Labs and  coordinating care for problems addressed at this encounter.

## 2017-06-13 ENCOUNTER — Non-Acute Institutional Stay (SKILLED_NURSING_FACILITY): Payer: Medicare Other | Admitting: Internal Medicine

## 2017-06-13 DIAGNOSIS — K219 Gastro-esophageal reflux disease without esophagitis: Secondary | ICD-10-CM | POA: Diagnosis not present

## 2017-06-13 DIAGNOSIS — R1111 Vomiting without nausea: Secondary | ICD-10-CM | POA: Diagnosis not present

## 2017-06-17 NOTE — Progress Notes (Signed)
The date is 06/13/2017   This is an acute visit.  Level care skilled.  Facility [facility].  Chief complaint-acute visit follow-up vomiting episode  History of present illness.  Patient is a pleasant 83 year old female with times will have vomiting-after she eats meals.  She has been started on routine Zofran and also is on Prilosec with a history of GERD.  This evening I noted she did have another episode of vomiting what appeared be an undigested food-however this quickly resolved I listen to her her lungs were clear there was no sign of distress she appears to be stable.  According nursing she still has these episodes at times and have become less frequent with the Zofran administration.  I evaluated the her shortly thereafter and she was back at her baseline sitting comfortably in her wheelchair no sign of distress again lungs were clear I did not note any cough  Past Medical History:  Diagnosis Date  . Asthma   . Dementia   . Diabetes mellitus   . H. pylori infection MAR 2014  . Hypertension   . Stroke [facility](H[facility])         Past Surgical History:  Procedure Laterality Date  . CHOLECYSTECTOMY    . COLECTOMY     secondary to diverticulitis  . ESOPHAGOGASTRODUODENOSCOPY (EGD) WITH ESOPHAGEAL DILATION N/A 01/07/2013   ZOX:[id number]:Schatzki ring was found at the gastroesophageal junction/SINGLE DUODENAL DIVERTICULUM    No Known Allergies   Medication Sig  . acetaminophen (TYLENOL) 325 MG tablet Take 650 mg by mouth every 6 (six) hours as needed.  Marland Kitchen. aspirin EC 81 MG tablet Take 81 mg by mouth daily.  [name]. Balsam [place]-Castor Oil (VENELEX) OINT Apply to sacrum and bilateral buttocks q shift & prn every shift  . Calcium Carbonate-Vitamin D (CALCIUM 500/VITAMIN D PO) Take 500 mg by mouth 2 (two) times daily.  . citalopram (CELEXA) 20 MG tablet Take 20 mg by mouth daily.   . fluticasone (FLONASE) 50 MCG/ACT nasal spray Place 1 spray into both nostrils 2 (two) times  daily as needed for rhinitis.  . hydrocortisone (ANUSOL-HC) 2.5 % rectal cream Place 1 application rectally 3 (three) times daily as needed.   Marland Kitchen. ipratropium-albuterol (DUONEB) 0.5-2.5 (3) MG/3ML SOLN Give every 6 hours as needed prn for SOB and Wheezing  . lisinopril (PRINIVIL,ZESTRIL) 20 MG tablet Take 20 mg by mouth daily.   Marland Kitchen. loratadine (CLARITIN) 10 MG tablet Take 10 mg by mouth daily.  . memantine (NAMENDA) 10 MG tablet Take 10 mg by mouth 2 (two) times daily.  . Menthol, Topical Analgesic, (BIOFREEZE) 4 % GEL Apply prn to knees for pain management as needed daily  . mirtazapine (REMERON) 15 MG tablet Take 15 mg by mouth at bedtime.  Marland Kitchen. omeprazole (PRILOSEC) 40 MG capsule Take 40 mg by mouth 2 (two) times daily.  . ondansetron (ZOFRAN) 4 MG tablet Give 4 mg by mouth four times a day  . potassium chloride (K-DUR,KLOR-CON) 10 MEQ tablet Take 20 mEq by mouth daily.    No facility-administered encounter medications on file as of 05/31/2017.     Review of systems is quite limited secondary to dementia but she is not complaining of any shortness of breath abdominal discomfort or chest pain  Physical exam.  Pulse is 76-respirations are 18-weight shows stability be gaining has gained about 6 pounds recently-after experiencing weight loss initially.  In general this is a somewhat frail elderly female in no distress sitting comfortably in  her wheelchair.  Her skin is warm and dry.  Oropharynx is clear mucous membranes moist.  Chest is clear to auscultation there is no labored breathing.  Heart is regular rate and rhythm without murmur gallop or rub.  Abdomen continues to be soft nontender with active bowel sounds.  Labs.  05/07/2017.  Sodium 139 potassium 4.1 BUN 18 creatinine 0.82.  WBC 6.2 hemoglobin 11.7 platelets 223.  04/23/2017.  Liver function tests within normal limits except ALT of 10 and albumin was 3.1.  Assessment plan.  History of recurrent vomiting at times  she was still have these episodes and I have actually seen her previously for this but these apparently are intermittent and not persistent and have improved at this point will continue current Zofran before meals and the PPI-she has gained weight which is encouraging she does not show any signs of aspiration-we will monitor for now  [id number]

## 2017-06-25 ENCOUNTER — Encounter (HOSPITAL_COMMUNITY)
Admission: RE | Admit: 2017-06-25 | Discharge: 2017-06-25 | Disposition: A | Discharge status: Home / Self Care | Payer: Medicare Other | Source: Skilled Nursing Facility | Attending: Internal Medicine | Admitting: Internal Medicine

## 2017-06-25 DIAGNOSIS — F0390 Unspecified dementia without behavioral disturbance: Secondary | ICD-10-CM | POA: Diagnosis present

## 2017-06-25 DIAGNOSIS — M6281 Muscle weakness (generalized): Secondary | ICD-10-CM | POA: Insufficient documentation

## 2017-06-25 DIAGNOSIS — M810 Age-related osteoporosis without current pathological fracture: Secondary | ICD-10-CM | POA: Insufficient documentation

## 2017-06-25 DIAGNOSIS — R1312 Dysphagia, oropharyngeal phase: Secondary | ICD-10-CM | POA: Insufficient documentation

## 2017-06-25 DIAGNOSIS — Z5189 Encounter for other specified aftercare: Secondary | ICD-10-CM | POA: Diagnosis present

## 2017-06-25 LAB — BASIC METABOLIC PANEL
ANION GAP: 8 (ref 5–15)
BUN: 13 mg/dL (ref 6–20)
CHLORIDE: 101 mmol/L (ref 101–111)
CO2: 29 mmol/L (ref 22–32)
CREATININE: 0.83 mg/dL (ref 0.44–1.00)
Calcium: 8.8 mg/dL — ABNORMAL LOW (ref 8.9–10.3)
Glucose, Bld: 88 mg/dL (ref 65–99)
Potassium: 4 mmol/L (ref 3.5–5.1)
SODIUM: 138 mmol/L (ref 135–145)

## 2017-07-06 ENCOUNTER — Encounter: Payer: Self-pay | Admitting: Internal Medicine

## 2017-07-06 ENCOUNTER — Non-Acute Institutional Stay (SKILLED_NURSING_FACILITY): Payer: Medicare Other | Admitting: Internal Medicine

## 2017-07-06 DIAGNOSIS — M810 Age-related osteoporosis without current pathological fracture: Secondary | ICD-10-CM

## 2017-07-06 DIAGNOSIS — F331 Major depressive disorder, recurrent, moderate: Secondary | ICD-10-CM | POA: Diagnosis not present

## 2017-07-06 DIAGNOSIS — F0390 Unspecified dementia without behavioral disturbance: Secondary | ICD-10-CM

## 2017-07-06 DIAGNOSIS — I10 Essential (primary) hypertension: Secondary | ICD-10-CM

## 2017-07-06 DIAGNOSIS — K219 Gastro-esophageal reflux disease without esophagitis: Secondary | ICD-10-CM

## 2017-07-06 NOTE — Progress Notes (Signed)
Location:   [facility] Room Number: 115/W Place of Service:  SNF [phone]) Provider:  Sabino Dick, MD  Patient Care Team: [name], MD as PCP - General (Internal Medicine) Synetta Shadow as Physician Assistant (Internal Medicine)  Extended Emergency Contact Information Primary Emergency Contact: [name] [place] Home Phone: [phone] Relation: Son Secondary Emergency Contact: [name] [place] Home Phone: [phone] Mobile Phone: [phone] Relation: Son  Code Status:  Full Code Goals of care: Advanced Directive information Advanced Directives 07/06/2017  Does Patient Have a Medical Advance Directive? Yes  Type of Advance Directive (No Data)  Does patient want to make changes to medical advance directive? No - Patient declined  Copy of Healthcare Power of Attorney in Chart? -  Pre-existing out of facility DNR order (yellow form or pink MOST form) -     Chief complaint-routine visit for medical management of chronic medical conditions including dementia-hypertension-GERD-depression-allergic rhinitis-  HPI:  Pt is a 83 y.o. female seen today for medical management of chronic diseases.  As noted above.  She appears to be having appear to stability-most recent acute issue has been some recurrent vomiting at times when she eats but this has improved with Zofran before meals she is also on Prilosec-still occasionally has vomiting but again this is less persistent-at one point she had lost weight but she appears to have gained this back gradually.  Her other medical issues appear to be relatively stable as well she has a history of hypertension which is been stable on lisinopril beta blocker was discontinued because of bradycardia blood pressure has been stable-117/72.  In regards to dementia C again appears to be doing well with supportive care continues to be a poor historian but is  pleasant appropriate I have not noted any behaviors she is on Namenda.  She does have some history of rectal bleeding thought to be hemorrhoid related her hemoglobin has been stable most recently 11.7 we will update this.  She also has a questionable history of diabetes but hemoglobin A1c has been stable and was 6.4 back in June we will update this as well.  No one point she had hypokalemia when the vomiting improving apparently this is stabilized potassium was recently discontinued and potassium remains normal we will update a BMP however to ensure continued stability.  Currently she is sitting in her wheelchair comfortably often sits out in the hallway so she can see activities-she likes to be out amongst the other residents-which is encouraging.        Past Medical History:  Diagnosis Date  . Asthma   . Dementia   . Diabetes mellitus   . H. pylori infection MAR 2014  . Hypertension   . Stroke [facility])    Past Surgical History:  Procedure Laterality Date  . CHOLECYSTECTOMY    . COLECTOMY     secondary to diverticulitis  . ESOPHAGOGASTRODUODENOSCOPY (EGD) WITH ESOPHAGEAL DILATION N/A 01/07/2013   [id number] ring was found at the gastroesophageal junction/SINGLE DUODENAL DIVERTICULUM    No Known Allergies  Outpatient Encounter Prescriptions as of 07/06/2017  Medication Sig  . acetaminophen (TYLENOL) 325 MG tablet Take 650 mg by mouth every 6 (six) hours as needed.  Marland Kitchen aspirin EC 81 MG tablet Take 81 mg by mouth daily.  [name] [place]-Castor Oil (VENELEX) OINT Apply to sacrum and bilateral buttocks q shift & prn every shift  . Calcium Carbonate-Vitamin D (CALCIUM 500/VITAMIN D  PO) Take 500 mg by mouth 2 (two) times daily.  . citalopram (CELEXA) 20 MG tablet Take 20 mg by mouth daily.   . fluticasone (FLONASE) 50 MCG/ACT nasal spray Place 1 spray into both nostrils 2 (two) times daily as needed for rhinitis.  Marland Kitchen ipratropium-albuterol (DUONEB) 0.5-2.5 (3) MG/3ML SOLN Give every 6  hours as needed prn for SOB and Wheezing  . lisinopril (PRINIVIL,ZESTRIL) 20 MG tablet Take 20 mg by mouth daily.   Marland Kitchen loratadine (CLARITIN) 10 MG tablet Take 10 mg by mouth daily.  . memantine (NAMENDA) 10 MG tablet Take 10 mg by mouth 2 (two) times daily.  . Menthol, Topical Analgesic, (BIOFREEZE) 4 % GEL Apply prn to knees for pain management as needed daily  . mirtazapine (REMERON) 15 MG tablet Take 15 mg by mouth at bedtime.  Marland Kitchen omeprazole (PRILOSEC) 40 MG capsule Take 40 mg by mouth 2 (two) times daily.  . ondansetron (ZOFRAN) 4 MG tablet Give 4 mg by mouth four times a day  . [DISCONTINUED] hydrocortisone (ANUSOL-HC) 2.5 % rectal cream Place 1 application rectally 3 (three) times daily as needed.   . [DISCONTINUED] potassium chloride (K-DUR,KLOR-CON) 10 MEQ tablet Take 20 mEq by mouth daily.    No facility-administered encounter medications on file as of 07/06/2017.      Review of Systems  \This is limited secondary to patient being a poor historian but she is not complaining of any abdominal discomfort chest pain has intermittent vomiting but this has improved-does not complain of any shortness of breath-does not complain of feeling depressed at this point she appears to be enjoying sitting out in the hallway often-- watching activities  Immunization History  Administered Date(s) Administered  . Influenza-Unspecified 07/20/2016  . Pneumococcal-Unspecified 07/24/2016   Pertinent  Health Maintenance Due  Topic Date Due  . OPHTHALMOLOGY EXAM  08/05/2017 (Originally 06/03/1942)  . URINE MICROALBUMIN  08/05/2017 (Originally 06/03/1942)  . DEXA SCAN  08/05/2017 (Originally 06/04/1999)  . INFLUENZA VACCINE  09/15/2017 (Originally 05/16/2017)  . PNA vac Low Risk Adult (2 of 2 - PCV13) 07/24/2017  . HEMOGLOBIN A1C  09/26/2017  . FOOT EXAM  02/20/2018   No flowsheet data found. Functional Status Survey:    Vitals:   07/06/17 1220  BP: 117/72  Pulse: 75  Resp: 18  Temp: 97.6 F (36.4  C)  TempSrc: Oral  SpO2: 96%  Weight: 122 lb 6.4 oz (55.5 kg)  Height:  (1.651 m)   Body mass index is 20.37 kg/m. Physical Exam   In general this is a pleasant elderly female in no distress sitting comfortably in her wheelchair.  Her skin is warm and dry she is not diaphoretic.  Oropharynx is clear mucous membranes moist.  Chest is clear to auscultation there is no labored breathing.  Heart is regular rate and rhythm without murmur gallop or rub she does not have significant lower extremity edema.  Abdomen is soft nontender with active bowel sounds.  Musculoskeletal continues to move all extremities 4-some slight lower extremity weakness and largely ambulates in a wheelchair I do not see any changes other than arthritic.  Neurologic is grossly intact her speech is clear there are no lateralizing findings.  Psych she continues to be alert and oriented 1 pleasant and cooperative follow simple verbal commands without difficulty she can carry on short straightforward conversations.    Labs reviewed:  Recent Labs  09/01/16 0700  11/22/16 0715 12/14/16 0730  04/23/17 0300 05/07/17 0600 06/25/17 0600  NA  138  < > 140 140  < > 140 139 138  K 4.1  < > 4.5 3.8  < > 4.0 4.1 4.0  CL 104  < > 102 105  < > 103 102 101  CO2 28  < > 30 28  < > GLUCOSE 99  < > 93 87  < > 76 78 88  BUN 14  < > 11 6  < > CREATININE 0.74  < > 0.81 0.69  < > 0.83 0.82 0.83  CALCIUM 8.8*  < > 8.9 8.7*  < > 8.8* 9.0 8.8*  MG 1.9  --   --  1.7  --   --   --   --   PHOS  --   --  3.5 3.2  --   --   --   --   < > = values in this interval not displayed.  Recent Labs  03/02/17 1500 03/27/17 0741 04/23/17 0300  AST ALT 13* 11* 10*  ALKPHOS 60 64 53  BILITOT 0.5 0.5 0.5  PROT 6.8 6.6 6.5  ALBUMIN 3.7 3.0* 3.1*    Recent Labs  03/27/17 0741 04/03/17 0700 05/07/17 0600  WBC 6.8 6.7 6.2  NEUTROABS 4.1 3.6 2.9  HGB 12.6 12.1 11.7*  HCT 38.7 37.7 36.3    MCV 92.1 92.9 94.3  PLT 322 244 223   Lab Results  Component Value Date   TSH 0.778 03/02/2017   Lab Results  Component Value Date   HGBA1C 6.4 (H) 03/27/2017   No results found for: CHOL, HDL, LDLCALC, LDLDIRECT, TRIG, CHOLHDL  Significant Diagnostic Results in last 30 days:  No results found.  Assessment/Plan  #1 history of GERD-this appears relatively stabilized she does receive Zofran before meals is also on Prilosec still occasionally has vomiting episodes with these apparently are infrequent she is gaining weight which is encouraging-she continues on Remeron which appears to be helping.  #2 history of dementia this appears stable no recent behaviors noted there was some concern for coexistent depression she is on on Celexa this appears to be helping appears in good spirits today enjoys sitting out in the hallway watching others.-She is also on Remeron although this appears to be more for appetite stimulation  #3 history hypertension this appears stable on lisinopril as noted above not a candidate for beta blocker because of frequent bradycardic readings.  #4-history of osteoporosis with T score 3.67 she is on calcium with vitamin D.  #5-history of allergic rhinitis this is been relatively asymptomatic on chronic Claritin.  #6-: #6 history of rectal bleeding thought to be hemorrhoid-related hemoglobin has been stable but will recheck this most recently was 11.7 on lab done in July 2018.  #7 history of hypokalemia in the past day again this appears stabilized site being off potassium will update a metabolic panel to ensure stability.  #8-history of??diabetes hemoglobin A1c was 6.4 back in June which is satisfactory we will update this as well.  [id number]

## 2017-07-09 ENCOUNTER — Encounter (HOSPITAL_COMMUNITY)
Admission: RE | Admit: 2017-07-09 | Discharge: 2017-07-09 | Disposition: A | Discharge status: Home / Self Care | Payer: Medicare Other | Source: Skilled Nursing Facility | Attending: *Deleted | Admitting: *Deleted

## 2017-07-09 DIAGNOSIS — F0390 Unspecified dementia without behavioral disturbance: Secondary | ICD-10-CM | POA: Diagnosis not present

## 2017-07-09 LAB — BASIC METABOLIC PANEL
ANION GAP: 9 (ref 5–15)
BUN: 9 mg/dL (ref 6–20)
CHLORIDE: 102 mmol/L (ref 101–111)
CO2: 29 mmol/L (ref 22–32)
Calcium: 8.8 mg/dL — ABNORMAL LOW (ref 8.9–10.3)
Creatinine, Ser: 0.82 mg/dL (ref 0.44–1.00)
GFR calc non Af Amer: >60 mL/min (ref >60)
Glucose, Bld: 84 mg/dL (ref 65–99)
POTASSIUM: 3.7 mmol/L (ref 3.5–5.1)
SODIUM: 140 mmol/L (ref 135–145)

## 2017-07-09 LAB — CBC WITH DIFFERENTIAL/PLATELET
Basophils Absolute: 0.1 10*3/uL (ref 0.0–0.1)
Basophils Relative: 1 %
Eosinophils Absolute: 0.5 10*3/uL (ref 0.0–0.7)
Eosinophils Relative: 10 %
HCT: 36.1 % (ref 36.0–46.0)
HEMOGLOBIN: 12 g/dL (ref 12.0–15.0)
LYMPHS ABS: 1.9 10*3/uL (ref 0.7–4.0)
LYMPHS PCT: 37 %
MCH: 31.3 pg (ref 26.0–34.0)
MCHC: 33.2 g/dL (ref 30.0–36.0)
MCV: 94 fL (ref 78.0–100.0)
Monocytes Absolute: 0.8 10*3/uL (ref 0.1–1.0)
Monocytes Relative: 16 %
NEUTROS ABS: 1.7 10*3/uL (ref 1.7–7.7)
NEUTROS PCT: 36 %
PLATELETS: 209 10*3/uL (ref 150–400)
RBC: 3.84 MIL/uL — AB (ref 3.87–5.11)
RDW: 13.5 % (ref 11.5–15.5)
WBC: 4.9 10*3/uL (ref 4.0–10.5)

## 2017-07-09 LAB — HEMOGLOBIN A1C
Hgb A1c MFr Bld: 5.5 % (ref 4.8–5.6)
Mean Plasma Glucose: 111.15 mg/dL

## 2017-07-12 ENCOUNTER — Non-Acute Institutional Stay (SKILLED_NURSING_FACILITY): Payer: Medicare Other

## 2017-07-12 DIAGNOSIS — Z0000 Encounter for general adult medical examination without abnormal findings: Secondary | ICD-10-CM | POA: Diagnosis not present

## 2017-07-12 NOTE — Progress Notes (Signed)
Subjective:   Sabrina [name] is a 83 y.o. female who presents for an Initial Medicare Annual Wellness Visit at [facility]    Objective:    Today's Vitals   07/12/17 1431  BP: 118/78  Pulse: 76  Temp: 97.9 F (36.6 C)  TempSrc: Oral  SpO2: 97%  Weight: 122 lb (55.3 kg)  Height:  (1.651 m)   Body mass index is 20.3 kg/m.   Current Medications (verified) Outpatient Encounter Prescriptions as of 07/12/2017  Medication Sig  . acetaminophen (TYLENOL) 325 MG tablet Take 650 mg by mouth every 6 (six) hours as needed.  Marland Kitchen aspirin EC 81 MG tablet Take 81 mg by mouth daily.  [name] [place]-Castor Oil (VENELEX) OINT Apply to sacrum and bilateral buttocks q shift & prn every shift  . Calcium Carbonate-Vitamin D (CALCIUM 500/VITAMIN D PO) Take 500 mg by mouth 2 (two) times daily.  . citalopram (CELEXA) 20 MG tablet Take 20 mg by mouth daily.   . fluticasone (FLONASE) 50 MCG/ACT nasal spray Place 1 spray into both nostrils 2 (two) times daily as needed for rhinitis.  Marland Kitchen ipratropium-albuterol (DUONEB) 0.5-2.5 (3) MG/3ML SOLN Give every 6 hours as needed prn for SOB and Wheezing  . lisinopril (PRINIVIL,ZESTRIL) 20 MG tablet Take 20 mg by mouth daily.   Marland Kitchen loratadine (CLARITIN) 10 MG tablet Take 10 mg by mouth daily.  . memantine (NAMENDA) 10 MG tablet Take 10 mg by mouth 2 (two) times daily.  . Menthol, Topical Analgesic, (BIOFREEZE) 4 % GEL Apply prn to knees for pain management as needed daily  . mirtazapine (REMERON) 15 MG tablet Take 15 mg by mouth at bedtime.  Marland Kitchen omeprazole (PRILOSEC) 40 MG capsule Take 40 mg by mouth 2 (two) times daily.  . ondansetron (ZOFRAN) 4 MG tablet Give 4 mg by mouth four times a day   No facility-administered encounter medications on file as of 07/12/2017.     Allergies (verified) Patient has no known allergies.   History: Past Medical History:  Diagnosis Date  . Asthma   . Dementia   . Diabetes mellitus   . H. pylori  infection MAR 2014  . Hypertension   . Stroke [facility])    Past Surgical History:  Procedure Laterality Date  . CHOLECYSTECTOMY    . COLECTOMY     secondary to diverticulitis  . ESOPHAGOGASTRODUODENOSCOPY (EGD) WITH ESOPHAGEAL DILATION N/A 01/07/2013   [id number] ring was found at the gastroesophageal junction/SINGLE DUODENAL DIVERTICULUM   Family History  Problem Relation Age of Onset  . Colon cancer Neg Hx    Social History   Occupational History  . Not on file.   Social History Main Topics  . Smoking status: Never Smoker  . Smokeless tobacco: Never Used  . Alcohol use No  . Drug use: No  . Sexual activity: No    Tobacco Counseling Counseling given: Not Answered   Activities of Daily Living In your present state of health, do you have any difficulty performing the following activities: 07/12/2017 12/26/2016  Hearing? N N  Vision? N Y  Difficulty concentrating or making decisions? Sabrina [name]  Walking or climbing stairs? Y Y  Dressing or bathing? Y Y  Doing errands, shopping? Sabrina [name]  Preparing Food and eating ? Y -  Using the Toilet? Y -  In the past six months, have you accidently leaked urine? Y -  Do you have problems with loss of bowel control? Y -  Managing your Medications? Y -  Managing your Finances? Y -  Housekeeping or managing your Housekeeping? Y -  Some recent data might be hidden    Immunizations and Health Maintenance Immunization History  Administered Date(s) Administered  . Influenza-Unspecified 07/20/2016  . Pneumococcal-Unspecified 07/24/2016   There are no preventive care reminders to display for this patient.  Patient Care Team: [name], MD as PCP - General (Internal Medicine) Roena Malady, PA-C as Physician Assistant (Internal Medicine)  Indicate any recent Medical Services you may have received from other than [facility] providers in the past year (date may be approximate).     Assessment:   This is a routine wellness examination for  Sabrina [name].   Hearing/Vision screen No exam data present  Dietary issues and exercise activities discussed: Current Exercise Habits: The patient does not participate in regular exercise at present, Exercise limited by: orthopedic condition(s)  Goals    None     Depression Screen PHQ 2/9 Scores 07/12/2017  PHQ - 2 Score 3  PHQ- 9 Score 11    Fall Risk Fall Risk  07/12/2017  Falls in the past year? No    Cognitive Function:     6CIT Screen 07/12/2017  What Year? 4 points  What month? 3 points  What time? 0 points  Count back from 20 0 points  Months in reverse 0 points  Repeat phrase 10 points  Total Score 17    Screening Tests Health Maintenance  Topic Date Due  . OPHTHALMOLOGY EXAM  08/05/2017 (Originally 06/03/1942)  . URINE MICROALBUMIN  08/05/2017 (Originally 06/03/1942)  . DEXA SCAN  08/05/2017 (Originally 06/04/1999)  . INFLUENZA VACCINE  09/15/2017 (Originally 05/16/2017)  . TETANUS/TDAP  10/16/2017 (Originally 06/03/1951)  . PNA vac Low Risk Adult (2 of 2 - PCV13) 07/24/2017  . HEMOGLOBIN A1C  01/06/2018  . FOOT EXAM  02/20/2018      Plan:    I have personally reviewed and addressed the Medicare Annual Wellness questionnaire and have noted the following in the patient's chart:  A. Medical and social history B. Use of alcohol, tobacco or illicit drugs  C. Current medications and supplements D. Functional ability and status E.  Nutritional status F.  Physical activity G. Advance directives H. List of other physicians I.  Hospitalizations, surgeries, and ER visits in previous 12 months J.  Vitals K. Screenings to include hearing, vision, cognitive, depression L. Referrals and appointments - none  In addition, I have reviewed and discussed with patient certain preventive protocols, quality metrics, and best practice recommendations. A written personalized care plan for preventive services as well as general preventive health recommendations were provided to  patient.  See attached scanned questionnaire for additional information.   Signed,   Annetta Maw, RN Nurse Health Advisor   Quick Notes   Health Maintenance: Eye exam, urine microalbumin, tdap, flu vaccine due     Abnormal Screen: PHQ-9:11, 6 CIT-17     Patient Concerns: None     Nurse Concerns: None

## 2017-07-12 NOTE — Patient Instructions (Signed)
Sabrina [name] , Thank you for taking time to come for your Medicare Wellness Visit. I appreciate your ongoing commitment to your health goals. Please review the following plan we discussed and let me know if I can assist you in the future.   Screening recommendations/referrals: Colonoscopy excluded, pt over age 81 Mammogram exclude,d pt over age 61 Bone Density up to date Recommended yearly ophthalmology/optometry visit for glaucoma screening and checkup Recommended yearly dental visit for hygiene and checkup  Vaccinations: Influenza vaccine due Pneumococcal vaccine up to date. Prevnar due 07/24/17 Tdap vaccine due Shingles vaccine not in records    Advanced directives: Need a copy for chart  Conditions/risks identified: None  Next appointment: Dr. Chales Abrahams makes rounds   Preventive Care 65 Years and Older, Female Preventive care refers to lifestyle choices and visits with your health care provider that can promote health and wellness. What does preventive care include?  A yearly physical exam. This is also called an annual well check.  Dental exams once or twice a year.  Routine eye exams. Ask your health care provider how often you should have your eyes checked.  Personal lifestyle choices, including:  Daily care of your teeth and gums.  Regular physical activity.  Eating a healthy diet.  Avoiding tobacco and drug use.  Limiting alcohol use.  Practicing safe sex.  Taking low-dose aspirin every day.  Taking vitamin and mineral supplements as recommended by your health care provider. What happens during an annual well check? The services and screenings done by your health care provider during your annual well check will depend on your age, overall health, lifestyle risk factors, and family history of disease. Counseling  Your health care provider may ask you questions about your:  Alcohol use.  Tobacco use.  Drug use.  Emotional well-being.  Home and  relationship well-being.  Sexual activity.  Eating habits.  History of falls.  Memory and ability to understand (cognition).  Work and work Astronomer.  Reproductive health. Screening  You may have the following tests or measurements:  Height, weight, and BMI.  Blood pressure.  Lipid and cholesterol levels. These may be checked every 5 years, or more frequently if you are over 47 years old.  Skin check.  Lung cancer screening. You may have this screening every year starting at age 46 if you have a 30-pack-year history of smoking and currently smoke or have quit within the past 15 years.  Fecal occult blood test (FOBT) of the stool. You may have this test every year starting at age 46.  Flexible sigmoidoscopy or colonoscopy. You may have a sigmoidoscopy every 5 years or a colonoscopy every 10 years starting at age 54.  Hepatitis C blood test.  Hepatitis B blood test.  Sexually transmitted disease (STD) testing.  Diabetes screening. This is done by checking your blood sugar (glucose) after you have not eaten for a while (fasting). You may have this done every 1-3 years.  Bone density scan. This is done to screen for osteoporosis. You may have this done starting at age 55.  Mammogram. This may be done every 1-2 years. Talk to your health care provider about how often you should have regular mammograms. Talk with your health care provider about your test results, treatment options, and if necessary, the need for more tests. Vaccines  Your health care provider may recommend certain vaccines, such as:  Influenza vaccine. This is recommended every year.  Tetanus, diphtheria, and acellular pertussis (Tdap, Td) vaccine. You may  need a Td booster every 10 years.  Zoster vaccine. You may need this after age 93.  Pneumococcal 13-valent conjugate (PCV13) vaccine. One dose is recommended after age 18.  Pneumococcal polysaccharide (PPSV23) vaccine. One dose is recommended after  age 59. Talk to your health care provider about which screenings and vaccines you need and how often you need them. This information is not intended to replace advice given to you by your health care provider. Make sure you discuss any questions you have with your health care provider. Document Released: 10/29/2015 Document Revised: 06/21/2016 Document Reviewed: 08/03/2015 Elsevier Interactive Patient Education  2017 [place] Prevention in the Home Falls can cause injuries. They can happen to people of all ages. There are many things you can do to make your home safe and to help prevent falls. What can I do on the outside of my home?  Regularly fix the edges of walkways and driveways and fix any cracks.  Remove anything that might make you trip as you walk through a door, such as a raised step or threshold.  Trim any bushes or trees on the path to your home.  Use bright outdoor lighting.  Clear any walking paths of anything that might make someone trip, such as rocks or tools.  Regularly check to see if handrails are loose or broken. Make sure that both sides of any steps have handrails.  Any raised decks and porches should have guardrails on the edges.  Have any leaves, snow, or ice cleared regularly.  Use sand or salt on walking paths during winter.  Clean up any spills in your garage right away. This includes oil or grease spills. What can I do in the bathroom?  Use night lights.  Install grab bars by the toilet and in the tub and shower. Do not use towel bars as grab bars.  Use non-skid mats or decals in the tub or shower.  If you need to sit down in the shower, use a plastic, non-slip stool.  Keep the floor dry. Clean up any water that spills on the floor as soon as it happens.  Remove soap buildup in the tub or shower regularly.  Attach bath mats securely with double-sided non-slip rug tape.  Do not have throw rugs and other things on the floor that can  make you trip. What can I do in the bedroom?  Use night lights.  Make sure that you have a light by your bed that is easy to reach.  Do not use any sheets or blankets that are too big for your bed. They should not hang down onto the floor.  Have a firm chair that has side arms. You can use this for support while you get dressed.  Do not have throw rugs and other things on the floor that can make you trip. What can I do in the kitchen?  Clean up any spills right away.  Avoid walking on wet floors.  Keep items that you use a lot in easy-to-reach places.  If you need to reach something above you, use a strong step stool that has a grab bar.  Keep electrical cords out of the way.  Do not use floor polish or wax that makes floors slippery. If you must use wax, use non-skid floor wax.  Do not have throw rugs and other things on the floor that can make you trip. What can I do with my stairs?  Do not leave any items on  the stairs.  Make sure that there are handrails on both sides of the stairs and use them. Fix handrails that are broken or loose. Make sure that handrails are as long as the stairways.  Check any carpeting to make sure that it is firmly attached to the stairs. Fix any carpet that is loose or worn.  Avoid having throw rugs at the top or bottom of the stairs. If you do have throw rugs, attach them to the floor with carpet tape.  Make sure that you have a light switch at the top of the stairs and the bottom of the stairs. If you do not have them, ask someone to add them for you. What else can I do to help prevent falls?  Wear shoes that:  Do not have high heels.  Have rubber bottoms.  Are comfortable and fit you well.  Are closed at the toe. Do not wear sandals.  If you use a stepladder:  Make sure that it is fully opened. Do not climb a closed stepladder.  Make sure that both sides of the stepladder are locked into place.  Ask someone to hold it for you,  if possible.  Clearly mark and make sure that you can see:  Any grab bars or handrails.  First and last steps.  Where the edge of each step is.  Use tools that help you move around (mobility aids) if they are needed. These include:  Canes.  Walkers.  Scooters.  Crutches.  Turn on the lights when you go into a dark area. Replace any light bulbs as soon as they burn out.  Set up your furniture so you have a clear path. Avoid moving your furniture around.  If any of your floors are uneven, fix them.  If there are any pets around you, be aware of where they are.  Review your medicines with your doctor. Some medicines can make you feel dizzy. This can increase your chance of falling. Ask your doctor what other things that you can do to help prevent falls. This information is not intended to replace advice given to you by your health care provider. Make sure you discuss any questions you have with your health care provider. Document Released: 07/29/2009 Document Revised: 03/09/2016 Document Reviewed: 11/06/2014 Elsevier Interactive Patient Education  2017 [employer].

## 2017-07-31 ENCOUNTER — Encounter: Payer: Self-pay | Admitting: Internal Medicine

## 2017-07-31 ENCOUNTER — Non-Acute Institutional Stay (SKILLED_NURSING_FACILITY): Payer: Medicare Other | Admitting: Internal Medicine

## 2017-07-31 DIAGNOSIS — M810 Age-related osteoporosis without current pathological fracture: Secondary | ICD-10-CM | POA: Diagnosis not present

## 2017-07-31 DIAGNOSIS — K224 Dyskinesia of esophagus: Secondary | ICD-10-CM | POA: Diagnosis not present

## 2017-07-31 DIAGNOSIS — F0390 Unspecified dementia without behavioral disturbance: Secondary | ICD-10-CM

## 2017-07-31 DIAGNOSIS — I10 Essential (primary) hypertension: Secondary | ICD-10-CM | POA: Diagnosis not present

## 2017-07-31 DIAGNOSIS — F331 Major depressive disorder, recurrent, moderate: Secondary | ICD-10-CM

## 2017-07-31 NOTE — Progress Notes (Signed)
Location:   [facility] Room Number: 115/W Place of Service:  SNF [phone]) Provider:  Lenon Curt, Freddie Breech, MD  Patient Care Team: [name], MD as PCP - General (Internal Medicine) Synetta Shadow as Physician Assistant (Internal Medicine)  Extended Emergency Contact Information Primary Emergency Contact: [name] [place] Home Phone: [phone] Relation: Son Secondary Emergency Contact: [name] [place] Home Phone: [phone] Mobile Phone: [phone] Relation: Son  Code Status:  Full Code Goals of care: Advanced Directive information Advanced Directives 07/31/2017  Does Patient Have a Medical Advance Directive? Yes  Type of Advance Directive (No Data)  Does patient want to make changes to medical advance directive? No - Patient declined  Copy of Healthcare Power of Attorney in Chart? No - copy requested  Pre-existing out of facility DNR order (yellow form or pink MOST form) -     Chief Complaint  Patient presents with  . Medical Management of Chronic Issues    Routine Visit    HPI:  Pt is a 83 y.o. female seen today for medical management of chronic diseases.    Patient has H/O Hypertension, recurrent Vomiting, Falls, GERD and Dementia. And Osteoporosis Patient is Long Term resident of facility and doing well. No New Nursing issues. Her weight is up from 121 to 123 lbs. She Does have some Episodes of Vomiting mainly when she doesn't like food or she overeats. Patient unable to give me any history due to her Dementia.   Past Medical History:  Diagnosis Date  . Asthma   . Dementia   . Diabetes mellitus   . H. pylori infection MAR 2014  . Hypertension   . Stroke [facility])    Past Surgical History:  Procedure Laterality Date  . CHOLECYSTECTOMY    . COLECTOMY     secondary to diverticulitis  . ESOPHAGOGASTRODUODENOSCOPY (EGD) WITH ESOPHAGEAL DILATION N/A 01/07/2013   QIO:[id number] ring was found at the gastroesophageal junction/SINGLE DUODENAL DIVERTICULUM    No Known Allergies  Outpatient Encounter Prescriptions as of 07/31/2017  Medication Sig  . acetaminophen (TYLENOL) 325 MG tablet Take 650 mg by mouth every 6 (six) hours as needed.  Marland Kitchen aspirin EC 81 MG tablet Take 81 mg by mouth daily.  [name] [place]-Castor Oil (VENELEX) OINT Apply to sacrum and bilateral buttocks q shift & prn every shift  . Calcium Carbonate-Vitamin D (CALCIUM 500/VITAMIN D PO) Take 500 mg by mouth 2 (two) times daily.  . citalopram (CELEXA) 20 MG tablet Take 20 mg by mouth daily.   Marland Kitchen ipratropium-albuterol (DUONEB) 0.5-2.5 (3) MG/3ML SOLN Give every 6 hours as needed prn for SOB and Wheezing  . lisinopril (PRINIVIL,ZESTRIL) 20 MG tablet Take 20 mg by mouth daily.   Marland Kitchen loratadine (CLARITIN) 10 MG tablet Take 10 mg by mouth daily.  . memantine (NAMENDA) 10 MG tablet Take 10 mg by mouth 2 (two) times daily.  . Menthol, Topical Analgesic, (BIOFREEZE) 4 % GEL Apply prn to knees for pain management as needed daily  . mirtazapine (REMERON) 15 MG tablet Take 15 mg by mouth at bedtime.  Marland Kitchen omeprazole (PRILOSEC) 40 MG capsule Take 40 mg by mouth 2 (two) times daily.  . ondansetron (ZOFRAN) 4 MG tablet Give 4 mg by mouth four times a day  . [DISCONTINUED] fluticasone (FLONASE) 50 MCG/ACT nasal spray Place 1 spray into both nostrils 2 (two) times daily as needed for rhinitis.   No facility-administered encounter medications on  file as of 07/31/2017.      Review of Systems  Unable to perform ROS: Dementia      Immunization History  Administered Date(s) Administered  . Influenza-Unspecified 07/20/2016, 07/20/2017  . Pneumococcal Conjugate-13 06/12/2017  . Pneumococcal-Unspecified 07/24/2016  . Tdap 07/12/2017   Pertinent  Health Maintenance Due  Topic Date Due  . OPHTHALMOLOGY EXAM  08/05/2017 (Originally 06/03/1942)  . URINE MICROALBUMIN  08/05/2017 (Originally 06/03/1942)  .  HEMOGLOBIN A1C  01/06/2018  . FOOT EXAM  02/20/2018  . INFLUENZA VACCINE  Completed  . DEXA SCAN  Completed  . PNA vac Low Risk Adult  Completed   Fall Risk  07/12/2017  Falls in the past year? No   Functional Status Survey:    Vitals:   07/31/17 1151  BP: 108/62  Pulse: 77  Resp: 20  Temp: 97.9 F (36.6 C)  TempSrc: Oral  SpO2: 96%  Weight: 123 lb 9.6 oz (56.1 kg)  Height:  (1.651 m)   Body mass index is 20.57 kg/m. Physical Exam  Constitutional: She appears well-developed and well-nourished.  HENT:  Head: Normocephalic.  Mouth/Throat: Oropharynx is clear and moist.  Eyes: Pupils are equal, round, and reactive to light.  Neck: Neck supple.  Cardiovascular: Normal rate and normal heart sounds.   Pulmonary/Chest: Effort normal and breath sounds normal. No respiratory distress. She has no wheezes. She has no rales.  Abdominal: Soft. Bowel sounds are normal. She exhibits no distension. There is no tenderness. There is no rebound.  Musculoskeletal: She exhibits no edema.  Neurological: She is alert.  Skin: Skin is warm and dry.  Psychiatric: She has a normal mood and affect. Her behavior is normal.    Labs reviewed:  Recent Labs  09/01/16 0700  11/22/16 0715 12/14/16 0730  05/07/17 0600 06/25/17 0600 07/09/17 0430  NA 138  < > 140 140  < > 139 138 140  K 4.1  < > 4.5 3.8  < > 4.1 4.0 3.7  CL 104  < > 102 105  < > 102 101 102  CO2 28  < > 30 28  < > GLUCOSE 99  < > 93 87  < > 78 88 84  BUN 14  < > 11 6  < > CREATININE 0.74  < > 0.81 0.69  < > 0.82 0.83 0.82  CALCIUM 8.8*  < > 8.9 8.7*  < > 9.0 8.8* 8.8*  MG 1.9  --   --  1.7  --   --   --   --   PHOS  --   --  3.5 3.2  --   --   --   --   < > = values in this interval not displayed.  Recent Labs  03/02/17 1500 03/27/17 0741 04/23/17 0300  AST ALT 13* 11* 10*  ALKPHOS 60 64 53  BILITOT 0.5 0.5 0.5  PROT 6.8 6.6 6.5  ALBUMIN 3.7 3.0* 3.1*    Recent Labs   04/03/17 0700 05/07/17 0600 07/09/17 0430  WBC 6.7 6.2 4.9  NEUTROABS 3.6 2.9 1.7  HGB 12.1 11.7* 12.0  HCT 37.7 36.3 36.1  MCV 92.9 94.3 94.0  PLT 244 223 209   Lab Results  Component Value Date   TSH 0.778 03/02/2017   Lab Results  Component Value Date   HGBA1C 5.5 07/09/2017   No results found for: CHOL, HDL, LDLCALC, LDLDIRECT, TRIG, CHOLHDL  Significant Diagnostic Results in last 30 days:  No results found.  Assessment/Plan  Essential hypertension BP low. On Lisinopril Will decrease the dose to 10 mg.  Metoprolol Was stopped due to Bradycardia  Age-related osteoporosis Patient on Prolia  Esophageal dysmotility She is on Prilosec and Zofran Before Meals  Depression Stable and doing well on Remeron and celexa  Dementia  Stable on Namenda  Allergic rhinitis Continue Claritin Family/ staff Communication:   Labs/tests ordered:  Labs reviewed.   Total time spent in this patient care encounter was 25_ minutes; greater than 50% of the visit spent counseling patient, reviewing records , Labs and coordinating care for problems addressed at this encounter.

## 2017-08-21 ENCOUNTER — Non-Acute Institutional Stay (SKILLED_NURSING_FACILITY): Payer: Medicare Other | Admitting: Internal Medicine

## 2017-08-21 ENCOUNTER — Encounter: Payer: Self-pay | Admitting: Internal Medicine

## 2017-08-21 DIAGNOSIS — F0390 Unspecified dementia without behavioral disturbance: Secondary | ICD-10-CM | POA: Diagnosis not present

## 2017-08-21 DIAGNOSIS — R112 Nausea with vomiting, unspecified: Secondary | ICD-10-CM

## 2017-08-21 DIAGNOSIS — F331 Major depressive disorder, recurrent, moderate: Secondary | ICD-10-CM

## 2017-08-21 DIAGNOSIS — M810 Age-related osteoporosis without current pathological fracture: Secondary | ICD-10-CM

## 2017-08-21 DIAGNOSIS — I10 Essential (primary) hypertension: Secondary | ICD-10-CM | POA: Diagnosis not present

## 2017-08-21 NOTE — Progress Notes (Signed)
Location:    Nursing Home Room Number: 115/W Place of Service:  SNF (31) Provider:  Edmon CrapeArlo Savaughn Karwowski PA-C  Mahlon GammonGupta, Anjali L, MD  Patient Care Team: Mahlon GammonGupta, Anjali L, MD as PCP - General (Internal Medicine) Synetta ShadowLassen, Teondre Jarosz C, PA-C as Physician Assistant (Internal Medicine)  Extended Emergency Contact Information Primary Emergency Contact: [name],[name]  [place] Home Phone: [phone] Relation: Son Secondary Emergency Contact: [name],[name]  [place] Home Phone: [phone] Mobile Phone: [phone] Relation: Son  Code Status:   Goals of care: Advanced Directive information Advanced Directives 07/31/2017  Does Patient Have a Medical Advance Directive? Yes  Type of Advance Directive (No Data)  Does patient want to make changes to medical advance directive? No - Patient declined  Copy of Healthcare Power of Attorney in Chart? No - copy requested  Pre-existing out of facility DNR order (yellow form or pink MOST form) -    Chief complaint-routine visit for medical management of chronic medical issues including dementia-hypertension-weight loss-GERD-osteoporosis-depression-allergic rhinitis-history of recurrent vomiting   HPI:  Pt is a 83 y.o. female seen today for medical management of chronic diseases. As noted above.  Most recent acute issue was weight loss as well as recurrent vomiting-both of these appear to have stabilized her weight was only 110 pounds back in June but over the next month she came back her weight to baseline which is in the low 120s she has maintained-she also had recurrent vomiting with possibly may have contributed to this-he has been put on Zofran routinely and this has helped significantly-she does not complain of any further vomiting or abdominal discomfort.  She does have history of dementia but there've been no behaviors she is on Namenda.  She does have a possible history of diabetes type 2 but this has been stable hemoglobin  A1c was actually 5.5 on lab done in September.  In regards osteoporosis she is on calcium with vitamin D and had been on Prolia appears that this was inadvertently discontinued earlier this year Will reinstate  does have a history of f a femur fracture as well as proximal humerus fracture    She does have a history of hypertension-at one point had been on Lopressor but that was discontinued because of bradycardia-She is on lisinopril this was recently reduced because of some low blood pressures  Recent readings 119/67-94/57-I got 140/76 manually this evening  She does have a history of rectal bleeding in the past thought to be hemorrhoid related her hemoglobin has been stable now for an extended period of time most recently 12.0 on lab done in late September   At one point she had hypokalemia this apparently was thought secondary to vomiting which is stabilized-her potassium supplementation has been discontinued will update this to ensure stability.   Currently she is sitting in her wheelchair comfortably-she is on Namenda for dementai  Appears to abe under good control she is also on Celexa for coexistent depression and on Remeron for appetite stimulation              Past Medical History:  Diagnosis Date  . Asthma   . Dementia   . Diabetes mellitus   . H. pylori infection MAR 2014  . Hypertension   . Stroke [facility](HCC)    Past Surgical History:  Procedure Laterality Date  . CHOLECYSTECTOMY    . COLECTOMY     secondary to diverticulitis    No Known Allergies  Outpatient Encounter Medications as of 08/21/2017  Medication Sig  .  acetaminophen (TYLENOL) 325 MG tablet Take 650 mg by mouth every 6 (six) hours as needed.  Marland Kitchen. aspirin EC 81 MG tablet Take 81 mg by mouth daily.  [name]. Balsam [place]-Castor Oil (VENELEX) OINT Apply to sacrum and bilateral buttocks q shift & prn every shift  . Calcium Carbonate-Vitamin D (CALCIUM 500/VITAMIN D PO) Take 500 mg by mouth 2 (two) times daily.    . citalopram (CELEXA) 20 MG tablet Take 20 mg by mouth daily.   Marland Kitchen. ipratropium-albuterol (DUONEB) 0.5-2.5 (3) MG/3ML SOLN Give every 6 hours as needed prn for SOB and Wheezing  . lisinopril (PRINIVIL,ZESTRIL) 10 MG tablet Take 10 mg daily by mouth.  . loratadine (CLARITIN) 10 MG tablet Take 10 mg by mouth daily.  . memantine (NAMENDA) 10 MG tablet Take 10 mg by mouth 2 (two) times daily.  . Menthol, Topical Analgesic, (BIOFREEZE) 4 % GEL Apply prn to knees for pain management as needed daily  . mirtazapine (REMERON) 15 MG tablet Take 15 mg by mouth at bedtime.  Marland Kitchen. omeprazole (PRILOSEC) 40 MG capsule Take 40 mg by mouth 2 (two) times daily.  . ondansetron (ZOFRAN) 4 MG tablet Give 4 mg by mouth four times a day  . [DISCONTINUED] lisinopril (PRINIVIL,ZESTRIL) 20 MG tablet Take 20 mg by mouth daily.    No facility-administered encounter medications on file as of 08/21/2017.      Review of Systems   This is limited secondary to dementia she has no complaints this eveningnursing staff says she's eating better vomiting has largely resolved weight has stabilized  Immunization History  Administered Date(s) Administered  . Influenza-Unspecified 07/20/2016, 07/20/2017  . Pneumococcal Conjugate-13 06/12/2017  . Pneumococcal-Unspecified 07/24/2016  . Tdap 07/12/2017   Pertinent  Health Maintenance Due  Topic Date Due  . OPHTHALMOLOGY EXAM  09/15/2017 (Originally 06/03/1942)  . URINE MICROALBUMIN  09/15/2017 (Originally 06/03/1942)  . HEMOGLOBIN A1C  01/06/2018  . FOOT EXAM  02/20/2018  . INFLUENZA VACCINE  Completed  . DEXA SCAN  Completed  . PNA vac Low Risk Adult  Completed   Fall Risk  07/12/2017  Falls in the past year? No   Functional Status Survey:    Vitals:   08/21/17 1434  BP: 119/67  Pulse: 67  Resp: 20  Temp: 98.9 F (37.2 C)  TempSrc: Oral  SpO2: 96%  Weight: 121 lb 9.6 oz (55.2 kg)  Height: 5\' 5"  (1.651 m)  manual blood pressure this evening was 140/76 Body mass  index is 20.24 kg/m. Physical Exam  n general tthis is a pleasant elderly female in no distress she is sitting comfortably in her wheelchair   skin is warm and dry.  Oropharynx is clear mucous membranes moist.  Chest is clear to auscultation there is no labored breathing.  Heart is regular rate and rhythm without murmur gallop or rub--she does not really have significant lower extremity edema.  Abdomen is soft nontender with active  bowel sounds  Musculoskeletals all extremities 4 at baseline ambulates in a wheelchair with some baseline lower extremity weakness continues with diffuse arthritic changes.  Neurologic is grossly intact her speech is clear no lateralizing findings.  Psych she is oriented to self she is alert pleasant follow simple verbal commands  can have a limited conversation   Labs reviewed: Recent Labs    09/01/16 0700  11/22/16 0715 12/14/16 0730  05/07/17 0600 06/25/17 0600 07/09/17 0430  NA 138   < > 140 140   < > 139 138 140  K 4.1   < > 4.5 3.8   < > 4.1 4.0 3.7  CL 104   < > 102 105   < > 102 101 102  CO2 28   < > 30 28   < > 30 29 29   GLUCOSE 99   < > 93 87   < > 78 88 84  BUN 14   < > 11 6   < > 18 13 9   CREATININE 0.74   < > 0.81 0.69   < > 0.82 0.83 0.82  CALCIUM 8.8*   < > 8.9 8.7*   < > 9.0 8.8* 8.8*  MG 1.9  --   --  1.7  --   --   --   --   PHOS  --   --  3.5 3.2  --   --   --   --    < > = values in this interval not displayed.   Recent Labs    03/02/17 1500 03/27/17 0741 04/23/17 0300  AST 28 16 18   ALT 13* 11* 10*  ALKPHOS 60 64 53  BILITOT 0.5 0.5 0.5  PROT 6.8 6.6 6.5  ALBUMIN 3.7 3.0* 3.1*   Recent Labs    04/03/17 0700 05/07/17 0600 07/09/17 0430  WBC 6.7 6.2 4.9  NEUTROABS 3.6 2.9 1.7  HGB 12.1 11.7* 12.0  HCT 37.7 36.3 36.1  MCV 92.9 94.3 94.0  PLT 244 223 209   Lab Results  Component Value Date   TSH 0.778 03/02/2017   Lab Results  Component Value Date   HGBA1C 5.5 07/09/2017   No results found for:  CHOL, HDL, LDLCALC, LDLDIRECT, TRIG, CHOLHDL  Significant Diagnostic Results in last 30 days:  No results found.  Assessment/Plan   #1-weight -as noted above this has stabilized she is on supplements as well as Remeron  for appetite stimulation   #2 history of recurrent vomiting this has largely resolved she is on Zofran routinely I suspect this also has contributed to the stabilization of her weight There also may be an element of Gerd  controlled with the Prilosec  #3 hypertension at this point appears stable on low dose lisinopril-manual blood pressure 140/76 tonight-I did not  see consistent low readings but will write an order to hold lisinopril for systolic less than 105  #4 osteoporosis again she is on calcium with vitamin D will restart Prolia which should be administered every 6 months.  #5 history of dementia-this appears quite stable on Namenda she is doing well with supportivecare.  #6 depression this is stable on Celexa  #7 allergic rhinitis  Continues on Claritin this appears to be well controlled  #8-history of question diabetes HBGA1C satisfactory at 5.5-she is due for a diabetic eye exam as well as microalbumin which I will order  [id number]

## 2017-08-22 ENCOUNTER — Encounter (HOSPITAL_COMMUNITY)
Admission: RE | Admit: 2017-08-22 | Discharge: 2017-08-22 | Disposition: A | Discharge status: Home / Self Care | Payer: Medicare Other | Source: Skilled Nursing Facility | Attending: Internal Medicine | Admitting: Internal Medicine

## 2017-08-22 DIAGNOSIS — R1312 Dysphagia, oropharyngeal phase: Secondary | ICD-10-CM | POA: Diagnosis present

## 2017-08-22 DIAGNOSIS — F0390 Unspecified dementia without behavioral disturbance: Secondary | ICD-10-CM | POA: Diagnosis present

## 2017-08-22 DIAGNOSIS — Z9181 History of falling: Secondary | ICD-10-CM | POA: Insufficient documentation

## 2017-08-22 DIAGNOSIS — M6281 Muscle weakness (generalized): Secondary | ICD-10-CM | POA: Diagnosis present

## 2017-08-22 DIAGNOSIS — Z5189 Encounter for other specified aftercare: Secondary | ICD-10-CM | POA: Diagnosis present

## 2017-08-22 DIAGNOSIS — R279 Unspecified lack of coordination: Secondary | ICD-10-CM | POA: Diagnosis present

## 2017-08-22 DIAGNOSIS — E875 Hyperkalemia: Secondary | ICD-10-CM | POA: Diagnosis not present

## 2017-08-22 DIAGNOSIS — E119 Type 2 diabetes mellitus without complications: Secondary | ICD-10-CM | POA: Diagnosis not present

## 2017-08-22 LAB — BASIC METABOLIC PANEL
Anion gap: 10 (ref 5–15)
BUN: 14 mg/dL (ref 6–20)
CHLORIDE: 102 mmol/L (ref 101–111)
CO2: 28 mmol/L (ref 22–32)
CREATININE: 0.9 mg/dL (ref 0.44–1.00)
Calcium: 8.9 mg/dL (ref 8.9–10.3)
GFR, EST NON AFRICAN AMERICAN: 58 mL/min — AB (ref >60)
GLUCOSE: 98 mg/dL (ref 65–99)
Potassium: 4.1 mmol/L (ref 3.5–5.1)
Sodium: 140 mmol/L (ref 135–145)

## 2017-08-31 ENCOUNTER — Non-Acute Institutional Stay (SKILLED_NURSING_FACILITY): Payer: Medicare Other | Admitting: Internal Medicine

## 2017-08-31 ENCOUNTER — Encounter: Payer: Self-pay | Admitting: Internal Medicine

## 2017-08-31 DIAGNOSIS — M25562 Pain in left knee: Secondary | ICD-10-CM

## 2017-08-31 NOTE — Progress Notes (Signed)
This is an acute visit.  Level of CARE skilled.  [facility].  Chief complaint-acute visit status post fall with left elbow abrasion- left knee discomfort.  History of present illness.  Patient is a very pleasant 83 year old female who has been quite stable recently-she apparently fell while going to the bathroom and initially complained of some left knee pain also noted to have an abrasion on her left elbow.  She is a long-term resident of facility with a history of dementia as well as hypertension with weight loss which is stabilized as well as GERD and osteoporosis depression allergic rhinitis.  Regards to osteoporosis she is on calcium with vitamin D and also Prolia.  Apparently there was no loss of consciousness and she did not hit her head apparently does have a slight stumble while going to the toilet- currently she is not complaining of any acute pain says at times her knee feels a little sore when it is moved-does not really complain of any elbow discomfort.   Past Medical History:  Diagnosis Date  . Asthma   . Dementia   . Diabetes mellitus   . H. pylori infection MAR 2014  . Hypertension   . Stroke [facility](HCC)         Past Surgical History:  Procedure Laterality Date  . CHOLECYSTECTOMY    . COLECTOMY     secondary to diverticulitis    No Known Allergies         Medication Sig  . acetaminophen (TYLENOL) 325 MG tablet Take 650 mg by mouth every 6 (six) hours as needed.  Marland Kitchen. aspirin EC 81 MG tablet Take 81 mg by mouth daily.  [name]. Balsam [place]-Castor Oil (VENELEX) OINT Apply to sacrum and bilateral buttocks q shift & prn every shift  . Calcium Carbonate-Vitamin D (CALCIUM 500/VITAMIN D PO) Take 500 mg by mouth 2 (two) times daily.  . citalopram (CELEXA) 20 MG tablet Take 20 mg by mouth daily.   Marland Kitchen. ipratropium-albuterol (DUONEB) 0.5-2.5 (3) MG/3ML SOLN Give every 6 hours as needed prn for SOB and Wheezing  . lisinopril (PRINIVIL,ZESTRIL) 10 MG  tablet Take 10 mg daily by mouth.  . loratadine (CLARITIN) 10 MG tablet Take 10 mg by mouth daily.  . memantine (NAMENDA) 10 MG tablet Take 10 mg by mouth 2 (two) times daily.  . Menthol, Topical Analgesic, (BIOFREEZE) 4 % GEL Apply prn to knees for pain management as needed daily  . mirtazapine (REMERON) 15 MG tablet Take 15 mg by mouth at bedtime.  Marland Kitchen. omeprazole (PRILOSEC) 40 MG capsule Take 40 mg by mouth 2 (two) times daily.  . ondansetron (ZOFRAN) 4 MG tablet Give 4 mg by mouth four times a day  . [DISCONTINUED] lisinopril (PRINIVIL,ZESTRIL) 20 MG tablet Take 20 mg by mouth daily.        Review of systems.  This is limited secondary to dementia-please see HPI-she complains at times of some left knee soreness but otherwise has no complaints of any pain-no shortness of breath- no history of syncope-dizziness--does not complain of hip pain    Physical exam.  She is afebrile pulse of 88 respirations of 18 blood pressure taken manually 130/62.  In general this is a pleasant elderly female in no distress sitting comfortably in her wheelchair.  Her skin is warm and dry she has a small abrasion left elbow area I do not see any sign of cellulitis-.  Eyes pupils appear reactive to light sclera and conjunctive are clear visual  acuity appears grossly intact.  Oropharynx is clear mucous membranes moist.  Chest is clear to auscultation there is no labored breathing.  Heart is regular rate and rhythm without murmur gallop or rub she does not really have any significant lower extremity edema.  Her abdomen soft nontender with positive bowel sounds.  Musculoskeletal ambulates in a wheelchair moves her extremities at baseline has limited range of motion of her shoulders bilaterally which is baseline-is able to flex and extend her left arm without significant pain again she does have the abrasion noted on her elbow I do not note any deformity.  Is able to move her lower extremities at  baseline does states she has some mild soreness when left knee is flexed and extended-I do not see any acute changes or deformities.  Neurologic is grossly intact her speech is clear she is alert.  No lateralizing findings.  Psych she is oriented to self pleasant and appropriate follow simple verbal commands and can carry on a short straightforward conversation labs  Labs.  August 22, 2017.  Sodium 140 potassium 4.1 BUN 14 creatinine 0.9.  WBC 4.9 hemoglobin 12.0 platelets 209  . Assessment plan.  1.  Left knee soreness- we have ordered an x-ray- physical exam was fairly benign has some mild soreness when the knee is flexed and extended otherwise physical exam was fairly benign-.  In regards to pain management she does have as needed Biofreeze as well as Tylenol as needed-- family would prefer to avoid any stronger medication-at this point I do not feel stronger pain medication would be warranted  [phone]

## 2017-09-03 ENCOUNTER — Encounter: Payer: Self-pay | Admitting: Internal Medicine

## 2017-09-03 ENCOUNTER — Non-Acute Institutional Stay (SKILLED_NURSING_FACILITY): Payer: Medicare Other | Admitting: Internal Medicine

## 2017-09-03 ENCOUNTER — Encounter (HOSPITAL_COMMUNITY)
Admission: RE | Admit: 2017-09-03 | Discharge: 2017-09-03 | Disposition: A | Discharge status: Home / Self Care | Payer: Medicare Other | Source: Skilled Nursing Facility | Attending: *Deleted | Admitting: *Deleted

## 2017-09-03 DIAGNOSIS — W19XXXD Unspecified fall, subsequent encounter: Secondary | ICD-10-CM

## 2017-09-03 DIAGNOSIS — R410 Disorientation, unspecified: Secondary | ICD-10-CM | POA: Diagnosis not present

## 2017-09-03 DIAGNOSIS — E875 Hyperkalemia: Secondary | ICD-10-CM | POA: Diagnosis not present

## 2017-09-03 DIAGNOSIS — F0390 Unspecified dementia without behavioral disturbance: Secondary | ICD-10-CM

## 2017-09-03 LAB — URINALYSIS, ROUTINE W REFLEX MICROSCOPIC
BILIRUBIN URINE: NEGATIVE
Glucose, UA: NEGATIVE mg/dL
KETONES UR: NEGATIVE mg/dL
NITRITE: NEGATIVE
PH: 5 (ref 5.0–8.0)
Protein, ur: 30 mg/dL — AB
SPECIFIC GRAVITY, URINE: 1.012 (ref 1.005–1.030)

## 2017-09-03 NOTE — Progress Notes (Signed)
This is an acute visit.  Level of CARE skilled.  [facility]  Chief complaint-acute visit secondary to increased confusion-falls    History of present illness   Patient is a pleasant 83 year old female who is a long-term resident of this facility.  She has a history of dementia as well as hypertension-GERD and osteoporosis as well as depression and allergic rhinitis.   I saw her last week for a fall while she was attempting to go the bathroom and sustained some left knee discomfort-however x-ray has come back showing no acute proces other than osteopenia she is on calcium with vitamin D as well as Prolia   Apparently over the weekend she fell a couple more times with no apparent injury-according to her nursing tech she appears more confused-and apparently thought she was at home  and trying to go to the bathroom when she was getting out of her bed this morning  She is afebrile and her vital signs are stable--she is sitting in her wheelchair comfortably and appears to be at baseline.  She  is not on any antianxiety medications or narcotics which may be contributing at times to falls  She does not have any complaints currently again sitting in her wheelchair comfortably.  Past Medical History:  Diagnosis Date  . Asthma   . Dementia   . Diabetes mellitus   . H. pylori infection MAR 2014  . Hypertension   . Stroke [facility](HCC)         Past Surgical History:  Procedure Laterality Date  . CHOLECYSTECTOMY    . COLECTOMY     secondary to diverticulitis    No Known Allergies         Medication Sig  . acetaminophen (TYLENOL) 325 MG tablet Take 650 mg by mouth every 6 (six) hours as needed.  Marland Kitchen. aspirin EC 81 MG tablet Take 81 mg by mouth daily.  [name]. Balsam [place]-Castor Oil (VENELEX) OINT Apply to sacrum and bilateral buttocks q shift & prn every shift  . Calcium Carbonate-Vitamin D (CALCIUM 500/VITAMIN D PO) Take 500 mg by mouth 2 (two) times  daily.  . citalopram (CELEXA) 20 MG tablet Take 20 mg by mouth daily.   Marland Kitchen. ipratropium-albuterol (DUONEB) 0.5-2.5 (3) MG/3ML SOLN Give every 6 hours as needed prn for SOB and Wheezing  . lisinopril (PRINIVIL,ZESTRIL) 10 MG tablet Take 10 mg daily by mouth.  . loratadine (CLARITIN) 10 MG tablet Take 10 mg by mouth daily.  . memantine (NAMENDA) 10 MG tablet Take 10 mg by mouth 2 (two) times daily.  . Menthol, Topical Analgesic, (BIOFREEZE) 4 % GEL Apply prn to knees for pain management as needed daily  . mirtazapine (REMERON) 15 MG tablet Take 15 mg by mouth at bedtime.  Marland Kitchen. omeprazole (PRILOSEC) 40 MG capsule Take 40 mg by mouth 2 (two) times daily.  . ondansetron (ZOFRAN) 4 MG tablet Give 4 mg by mouth four times a day  .  She is also on Prolia 60 mg/mL-every 180 days          Review of systems is somewhat limited secondary to dementia-but she is not currently complaining of joint pain any shortness of breath or dizziness no chest pain.  She says she feels like she is doing relatively well.  Nursing has not really noted any changes other than possibly increased confusion.  Physical exam.  Temperature is 97.9 pulse 75 respirations 20 blood pressure 124/63 O2 saturation is 97% on room air.  In general  this continues to be a pleasant elderly female in no distress sitting comfortably in her wheelchair she is pleasant smiling which is her baseline.  Her skin is warm and dry I do not note any new bruising  Eyes pupils appear reactive to light sclerae and conjunctive are clear visual acuity appears grossly intact.  Her oropharynx is clear mucous membranes moist.  Chest is clear to auscultation there is no labored breathing.  Heart is likely regular rate and rhythm with an occasional irregular beat she does not really have significant lower extremity edema.  Her abdomen is soft nontender with positive bowel sounds.  GU could not really appreciate suprapubic tenderness or  distention.  Musculoskeletal he is ambulatory in a wheelchair moves all her extremities at baseline is able to flex and extend her knees without pain with  baseline range of motion  Moves her upper extremities at baseline is able to flex and extend her arms without pain.  Neurologic is grossly intact her speech is clear no lateralizing findings she is alert bright makes eye contact.  Psych she is oriented to self pleasant smiling appropriate which is her baseline follow simple verbal commands without difficulty.  Labs.  August 22, 2017.  Sodium 140 potassium 4.1 BUN 14 creatinine 0.9.  July 09, 2017.  WBC 4.9 hemoglobin 12.0 platelets 209.   Assessment plan.  1.-  Increased falls with some increased confusion-one would be suspicious of a UTI- urinalysis and culture is pending she has been afebrile she appears to be stable and relatively at baseline although at times does have periods of confusion which I suspect are contributing to the falls-will await results of urine culture-urinalysis does show many bacteria WBCs too numerous to count and large leukocytes.  Also will update a CBC with differential and BMP in the morning to keep an eye on her renal function as well as any possible white count also will obtain a TSH.    Dementia-she has been stable without any behaviors she is on Namenda appears to be relatively baseline today at 1 point weight loss was a concern but this appears to have stabilized--  #3 falls-again she appears to not have sustained any significant injury-will await lab results as well as urine culture results continue to monitor-.  [id number]- of note greater than 25 minutes spent assessing patient discussing her status with nursing-reviewing her chart  And  labs-as well as coordinating plan of care including discussion with Dr.Gupta

## 2017-09-05 LAB — URINE CULTURE

## 2017-09-07 ENCOUNTER — Encounter (HOSPITAL_COMMUNITY)
Admission: RE | Admit: 2017-09-07 | Discharge: 2017-09-07 | Disposition: A | Discharge status: Home / Self Care | Payer: Medicare Other | Source: Skilled Nursing Facility | Attending: *Deleted | Admitting: *Deleted

## 2017-09-07 DIAGNOSIS — E875 Hyperkalemia: Secondary | ICD-10-CM | POA: Diagnosis not present

## 2017-09-10 ENCOUNTER — Non-Acute Institutional Stay (SKILLED_NURSING_FACILITY): Payer: Medicare Other | Admitting: Internal Medicine

## 2017-09-10 ENCOUNTER — Encounter: Payer: Self-pay | Admitting: Internal Medicine

## 2017-09-10 DIAGNOSIS — N390 Urinary tract infection, site not specified: Secondary | ICD-10-CM | POA: Diagnosis not present

## 2017-09-10 DIAGNOSIS — R5383 Other fatigue: Secondary | ICD-10-CM

## 2017-09-10 LAB — URINE CULTURE: Culture: 50000 — AB

## 2017-09-10 NOTE — Progress Notes (Signed)
Location:   [facility] Room Number: 115/W Place of Service:  SNF [phone]) Provider:  Sabino DickArlo Rayshon Albaugh  Gupta, Anjali L, MD  Patient Care Team: Mahlon GammonGupta, Anjali L, MD as PCP - General (Internal Medicine) Synetta ShadowLassen, Rithwik Schmieg C, PA-C as Physician Assistant (Internal Medicine)  Extended Emergency Contact Information Primary Emergency Contact: [name],[name]  [place] Home Phone: [phone] Relation: Son Secondary Emergency Contact: [name],[name]  [place] Home Phone: [phone] Mobile Phone: [phone] Relation: Son  Code Status:  Full Code Goals of care: Advanced Directive information Advanced Directives 09/10/2017  Does Patient Have a Medical Advance Directive? Yes  Type of Advance Directive (No Data)  Does patient want to make changes to medical advance directive? No - Patient declined  Copy of Healthcare Power of Attorney in Chart? No - copy requested  Pre-existing out of facility DNR order (yellow form or pink MOST form) -    Chief complaint-acute visit secondary to UTI   HPI:  Pt is a 83 y.o. female seen today for an acute visit for follow-up of UTI.  Patient recently had some increased confusion and complained of feeling "blah"-urinalysis and culture has come back showing 50,000 colonies of Klebsiella pneumonia and 100,000 colonies of E. coli-this is sensitive to numerous medications including most cephalosporins-currently she says she feels a bit blah but does not really have any specific complaints of pain or discomfort fever or chills.  Vital signs are stable she is afebrile.  She has numerous medical issues including dementia as well as hypertension GERD osteoporosis history of depression and allergic rhinitis   all these have been relatively stable at one point she had weight loss but this appears to have stabilized as well       Past Medical History:  Diagnosis Date  . Asthma   . Dementia   . Diabetes mellitus   .  H. pylori infection MAR 2014  . Hypertension   . Stroke [facility](HCC)    Past Surgical History:  Procedure Laterality Date  . CHOLECYSTECTOMY    . COLECTOMY     secondary to diverticulitis  . ESOPHAGOGASTRODUODENOSCOPY (EGD) WITH ESOPHAGEAL DILATION N/A 01/07/2013   QIO:[id number]:Schatzki ring was found at the gastroesophageal junction/SINGLE DUODENAL DIVERTICULUM    No Known Allergies  Outpatient Encounter Medications as of 09/10/2017  Medication Sig  . acetaminophen (TYLENOL) 325 MG tablet Take 650 mg by mouth every 6 (six) hours as needed.  Marland Kitchen. aspirin EC 81 MG tablet Take 81 mg by mouth daily.  [name]. Balsam [place]-Castor Oil (VENELEX) OINT Apply to sacrum and bilateral buttocks q shift & prn every shift  . Calcium Carbonate-Vitamin D (CALCIUM 500/VITAMIN D PO) Take 500 mg by mouth 2 (two) times daily.  . citalopram (CELEXA) 20 MG tablet Take 20 mg by mouth daily.   Marland Kitchen. denosumab (PROLIA) 60 MG/ML SOLN injection Inject 60 mg into the skin every 6 (six) months. Administer in upper arm, thigh, or abdomen  . ipratropium-albuterol (DUONEB) 0.5-2.5 (3) MG/3ML SOLN Give every 6 hours as needed prn for SOB and Wheezing  . lisinopril (PRINIVIL,ZESTRIL) 10 MG tablet Take 10 mg daily by mouth.  . loratadine (CLARITIN) 10 MG tablet Take 10 mg by mouth daily.  . memantine (NAMENDA) 10 MG tablet Take 10 mg by mouth 2 (two) times daily.  . Menthol, Topical Analgesic, (BIOFREEZE) 4 % GEL Apply prn to knees for pain management as needed daily  . mirtazapine (REMERON) 15 MG tablet Take 15 mg by mouth at  bedtime.  Marland Kitchen omeprazole (PRILOSEC) 40 MG capsule Take 40 mg by mouth 2 (two) times daily.  . ondansetron (ZOFRAN) 4 MG tablet Give 4 mg by mouth four times a day   No facility-administered encounter medications on file as of 09/10/2017.     Review of Systems  is somewhat limited secondary to dementia provided by nursing staff as well.  .  In general she is not complaining of any fever or chills says she just feels a bit  blah  Skin does not complain of rashes itching or diaphoresis.  Eyes does not complain of any visual changes.  Oropharynx is not complaining of any sore throat.  Respiratory does not complain of any increased cough beyond baseline or shortness of breath nursing staff has not noted any acute changes.  I need to write her a curette of her Ativan at night start her on Restoril  Again the need of prescription that not all the way  Cardiac is not complaining of any chest pain.  GI does not complain of abdominal discomfort nausea vomiting diarrhea constipation at one point had somewhat recurrent vomiting but this has improved on Zofran.  GU does not really complain of dysuria at this point.  Musculoskeletal does not complain of any acute pain or discomfort--has had recent falls with no apparent injury.  Neurologic does not complain of any dizziness headache or numbness.  Psych history of dementia depression but this is been quite stable recently nursing staff has not noted any acute changes  Immunization History  Administered Date(s) Administered  . Influenza-Unspecified 07/20/2016, 07/20/2017  . Pneumococcal Conjugate-13 06/12/2017  . Pneumococcal-Unspecified 07/24/2016  . Tdap 07/12/2017   Pertinent  Health Maintenance Due  Topic Date Due  . OPHTHALMOLOGY EXAM  09[birth year]15/2017 (Originally 06/03/1942)  . URINE MICROALBUMIN  09/15/2017 (Originally 06/03/1942)  . HEMOGLOBIN A1C  01/06/2018  . FOOT EXAM  02/20/2018  . INFLUENZA VACCINE  Completed  . DEXA SCAN  Completed  . PNA vac Low Risk Adult  Completed   Fall Risk  07/12/2017  Falls in the past year? No   Functional Status Survey:    Vitals:   09/10/17 1453  BP: 135/70  Pulse: 72  Resp: 19  Temp: (!) 97.2 F (36.2 C)  TempSrc: Oral   Weight is 121.6 pounds Physical Exam  In general this is a pleasant elderly female in no distress resting comfortably in bed.  Her skin is warm and dry.  Eyes sclera and conjunctive are  clear visual acuity appears grossly intact.  Oropharynx is clear mucous membranes moist.  Chest is clear to auscultation there is no labored breathing.  Heart is regular rate and rhythm without murmur gallop or rub she does not have significant lower extremity edema.  Her abdomen is soft nontender positive bowel sounds.  GU could not really appreciate suprapubic tenderness.  Musculoskeletal exam since she is in bed but is able appears to move all extremities at baseline strength appears to be preserved.  Neurologic is grossly intact no lateralizing findings her speech is clear grip strength is strong bilaterally.  Psych she is oriented to self pleasant and appropriate smiling says she just feels a little "blah"   Labs reviewed: Recent Labs    11/22/16 0715 12/14/16 0730  06/25/17 0600 07/09/17 0430 08/22/17 0718  NA 140 140   < > 138 140 140  K 4.5 3.8   < > 4.0 3.7 4.1  CL 102 105   < > 101 102 102  CO2 30 28   < > 29 29 28   GLUCOSE 93 87   < > 88 84 98  BUN 11 6   < > 13 9 14   CREATININE 0.81 0.69   < > 0.83 0.82 0.90  CALCIUM 8.9 8.7*   < > 8.8* 8.8* 8.9  MG  --  1.7  --   --   --   --   PHOS 3.5 3.2  --   --   --   --    < > = values in this interval not displayed.   Recent Labs    03/02/17 1500 03/27/17 0741 04/23/17 0300  AST 28 16 18   ALT 13* 11* 10*  ALKPHOS 60 64 53  BILITOT 0.5 0.5 0.5  PROT 6.8 6.6 6.5  ALBUMIN 3.7 3.0* 3.1*   Recent Labs    04/03/17 0700 05/07/17 0600 07/09/17 0430  WBC 6.7 6.2 4.9  NEUTROABS 3.6 2.9 1.7  HGB 12.1 11.7* 12.0  HCT 37.7 36.3 36.1  MCV 92.9 94.3 94.0  PLT 244 223 209   Lab Results  Component Value Date   TSH 0.778 03/02/2017   Lab Results  Component Value Date   HGBA1C 5.5 07/09/2017   No results found for: CHOL, HDL, LDLCALC, LDLDIRECT, TRIG, CHOLHDL  Significant Diagnostic Results in last 30 days:  No results found.  Assessment/Plan  #1-UTI-I have reviewed the sensitivitie as well as her renal  function and creatinine clearance-- and this was discussed with Dr. Chales AbrahamsGupta  as well-we will start her on Cefdinir 300 mg po bid for seven days--also will add a probiotic twice daily for 10 days Since she does report feeling somewhat fatigued and "blah"-this likely could be due to the UTI but will  update labs including a CBC with differential and BMP.--As well as a TSH  Clinically she appears to be stable-.  [id number]

## 2017-09-11 ENCOUNTER — Encounter (HOSPITAL_COMMUNITY)
Admission: RE | Admit: 2017-09-11 | Discharge: 2017-09-11 | Disposition: A | Discharge status: Home / Self Care | Payer: Medicare Other | Source: Skilled Nursing Facility | Attending: Internal Medicine | Admitting: Internal Medicine

## 2017-09-11 DIAGNOSIS — Z5189 Encounter for other specified aftercare: Secondary | ICD-10-CM | POA: Insufficient documentation

## 2017-09-11 DIAGNOSIS — R41841 Cognitive communication deficit: Secondary | ICD-10-CM | POA: Diagnosis present

## 2017-09-11 DIAGNOSIS — R1312 Dysphagia, oropharyngeal phase: Secondary | ICD-10-CM | POA: Insufficient documentation

## 2017-09-11 DIAGNOSIS — F0390 Unspecified dementia without behavioral disturbance: Secondary | ICD-10-CM | POA: Diagnosis present

## 2017-09-11 DIAGNOSIS — R279 Unspecified lack of coordination: Secondary | ICD-10-CM | POA: Diagnosis present

## 2017-09-11 LAB — TSH: TSH: 1.03 u[IU]/mL (ref 0.350–4.500)

## 2017-09-11 LAB — BASIC METABOLIC PANEL
Anion gap: 6 (ref 5–15)
BUN: 17 mg/dL (ref 6–20)
CALCIUM: 8.6 mg/dL — AB (ref 8.9–10.3)
CO2: 29 mmol/L (ref 22–32)
CREATININE: 0.9 mg/dL (ref 0.44–1.00)
Chloride: 104 mmol/L (ref 101–111)
GFR calc Af Amer: >60 mL/min (ref >60)
GFR, EST NON AFRICAN AMERICAN: 58 mL/min — AB (ref >60)
GLUCOSE: 95 mg/dL (ref 65–99)
Potassium: 3.8 mmol/L (ref 3.5–5.1)
SODIUM: 139 mmol/L (ref 135–145)

## 2017-09-11 LAB — CBC WITH DIFFERENTIAL/PLATELET
BASOS PCT: 1 %
Basophils Absolute: 0 10*3/uL (ref 0.0–0.1)
EOS ABS: 0.3 10*3/uL (ref 0.0–0.7)
EOS PCT: 5 %
HCT: 37 % (ref 36.0–46.0)
HEMOGLOBIN: 11.7 g/dL — AB (ref 12.0–15.0)
Lymphocytes Relative: 30 %
Lymphs Abs: 1.6 10*3/uL (ref 0.7–4.0)
MCH: 30.2 pg (ref 26.0–34.0)
MCHC: 31.6 g/dL (ref 30.0–36.0)
MCV: 95.6 fL (ref 78.0–100.0)
MONO ABS: 0.6 10*3/uL (ref 0.1–1.0)
MONOS PCT: 10 %
NEUTROS ABS: 2.9 10*3/uL (ref 1.7–7.7)
Neutrophils Relative %: 54 %
PLATELETS: 256 10*3/uL (ref 150–400)
RBC: 3.87 MIL/uL (ref 3.87–5.11)
RDW: 14 % (ref 11.5–15.5)
WBC: 5.3 10*3/uL (ref 4.0–10.5)

## 2017-10-10 ENCOUNTER — Encounter: Payer: Self-pay | Admitting: Internal Medicine

## 2017-10-10 ENCOUNTER — Encounter (HOSPITAL_COMMUNITY)
Admission: RE | Admit: 2017-10-10 | Discharge: 2017-10-10 | Disposition: A | Discharge status: Home / Self Care | Payer: Medicare Other | Source: Skilled Nursing Facility | Attending: Internal Medicine | Admitting: Internal Medicine

## 2017-10-10 ENCOUNTER — Non-Acute Institutional Stay (SKILLED_NURSING_FACILITY): Payer: Medicare Other | Admitting: Internal Medicine

## 2017-10-10 DIAGNOSIS — Z5189 Encounter for other specified aftercare: Secondary | ICD-10-CM | POA: Insufficient documentation

## 2017-10-10 DIAGNOSIS — R410 Disorientation, unspecified: Secondary | ICD-10-CM | POA: Diagnosis not present

## 2017-10-10 DIAGNOSIS — R1312 Dysphagia, oropharyngeal phase: Secondary | ICD-10-CM | POA: Diagnosis present

## 2017-10-10 DIAGNOSIS — F0390 Unspecified dementia without behavioral disturbance: Secondary | ICD-10-CM | POA: Diagnosis present

## 2017-10-10 DIAGNOSIS — E119 Type 2 diabetes mellitus without complications: Secondary | ICD-10-CM | POA: Insufficient documentation

## 2017-10-10 DIAGNOSIS — Z9181 History of falling: Secondary | ICD-10-CM | POA: Insufficient documentation

## 2017-10-10 DIAGNOSIS — R279 Unspecified lack of coordination: Secondary | ICD-10-CM | POA: Insufficient documentation

## 2017-10-10 DIAGNOSIS — M6281 Muscle weakness (generalized): Secondary | ICD-10-CM | POA: Insufficient documentation

## 2017-10-10 DIAGNOSIS — E875 Hyperkalemia: Secondary | ICD-10-CM | POA: Diagnosis not present

## 2017-10-10 DIAGNOSIS — R17 Unspecified jaundice: Secondary | ICD-10-CM | POA: Diagnosis not present

## 2017-10-10 LAB — AMYLASE: AMYLASE: 50 U/L (ref 28–100)

## 2017-10-10 LAB — COMPREHENSIVE METABOLIC PANEL
ALBUMIN: 3.7 g/dL (ref 3.5–5.0)
ALK PHOS: 61 U/L (ref 38–126)
ALT: 9 U/L — AB (ref 14–54)
AST: 19 U/L (ref 15–41)
Anion gap: 13 (ref 5–15)
BUN: 20 mg/dL (ref 6–20)
CALCIUM: 9 mg/dL (ref 8.9–10.3)
CHLORIDE: 103 mmol/L (ref 101–111)
CO2: 23 mmol/L (ref 22–32)
Creatinine, Ser: 0.85 mg/dL (ref 0.44–1.00)
GFR calc non Af Amer: >60 mL/min (ref >60)
GLUCOSE: 79 mg/dL (ref 65–99)
Potassium: 3.8 mmol/L (ref 3.5–5.1)
SODIUM: 139 mmol/L (ref 135–145)
Total Bilirubin: 0.6 mg/dL (ref 0.3–1.2)
Total Protein: 6.7 g/dL (ref 6.5–8.1)

## 2017-10-10 LAB — LIPASE, BLOOD: Lipase: 46 U/L (ref 11–51)

## 2017-10-10 LAB — BILIRUBIN, DIRECT: Bilirubin, Direct: 0.1 mg/dL (ref 0.1–0.5)

## 2017-10-10 NOTE — Progress Notes (Signed)
Location:   [facility] Room Number: 115/W Place of Service:  SNF [phone]) Provider:  Sabino DickArlo Manasvi Dickard  Gupta, Anjali L, MD  Patient Care Team: Mahlon GammonGupta, Anjali L, MD as PCP - General (Internal Medicine) Synetta ShadowLassen, Luwanna Brossman C, PA-C as Physician Assistant (Internal Medicine)  Extended Emergency Contact Information Primary Emergency Contact: [name],[name]  [place] Home Phone: [phone] Relation: Son Secondary Emergency Contact: [name],[name]  [place] Home Phone: [phone] Mobile Phone: [phone] Relation: Son  Code Status:  Full Code Goals of care: Advanced Directive information Advanced Directives 10/10/2017  Does Patient Have a Medical Advance Directive? Yes  Type of Advance Directive (No Data)  Does patient want to make changes to medical advance directive? No - Patient declined  Copy of Healthcare Power of Attorney in Chart? No - copy requested  Pre-existing out of facility DNR order (yellow form or pink MOST form) -     Chief Complaint  Patient presents with  . Acute Visit    Patients c/o Possible UTI and Confused and skin color orange tent    HPI:  Pt is a 83 y.o. female seen today for an acute visit for some increased confusion   Also her nurse feels she may have some yellowing of her skin beyond baseline.  Patient is a long-term resident of the facility with a history of dementia as well as hypertension GERD osteoporosis depression and allergic rhinitis as well as weight loss-all these conditions appear to have stabilized her weight has stabilized she appears to be clinically doing quite well.  She is a poor historian secondary to dementia but is not really complaining of any overt dysuria fever chills or feeling sick.  She does not complain of any abdominal pain.--She did recently complete treatment for E. coli UTI     Past Medical History:  Diagnosis Date  . Asthma   . Dementia   . Diabetes mellitus   . H.  pylori infection MAR 2014  . Hypertension   . Stroke [facility](HCC)    Past Surgical History:  Procedure Laterality Date  . CHOLECYSTECTOMY    . COLECTOMY     secondary to diverticulitis  . ESOPHAGOGASTRODUODENOSCOPY (EGD) WITH ESOPHAGEAL DILATION N/A 01/07/2013   QIO:[id number]:Schatzki ring was found at the gastroesophageal junction/SINGLE DUODENAL DIVERTICULUM    No Known Allergies  Outpatient Encounter Medications as of 10/10/2017  Medication Sig  . acetaminophen (TYLENOL) 325 MG tablet Take 650 mg by mouth every 6 (six) hours as needed.  Marland Kitchen. aspirin EC 81 MG tablet Take 81 mg by mouth daily.  [name]. Balsam [place]-Castor Oil (VENELEX) OINT Apply to sacrum and bilateral buttocks q shift & prn every shift  . Calcium Carbonate-Vitamin D (CALCIUM 500/VITAMIN D PO) Take 500 mg by mouth 2 (two) times daily.  . citalopram (CELEXA) 20 MG tablet Take 20 mg by mouth daily.   Marland Kitchen. denosumab (PROLIA) 60 MG/ML SOLN injection Inject 60 mg into the skin every 6 (six) months. Administer in upper arm, thigh, or abdomen  . ipratropium-albuterol (DUONEB) 0.5-2.5 (3) MG/3ML SOLN Give every 6 hours as needed prn for SOB and Wheezing  . lisinopril (PRINIVIL,ZESTRIL) 10 MG tablet Take 10 mg daily by mouth.  . loratadine (CLARITIN) 10 MG tablet Take 10 mg by mouth daily.  . memantine (NAMENDA) 10 MG tablet Take 10 mg by mouth 2 (two) times daily.  . Menthol, Topical Analgesic, (BIOFREEZE) 4 % GEL Apply prn to knees for pain management as needed daily  .  mirtazapine (REMERON) 15 MG tablet Take 15 mg by mouth at bedtime.  Marland Kitchen omeprazole (PRILOSEC) 40 MG capsule Take 40 mg by mouth 2 (two) times daily.  . ondansetron (ZOFRAN) 4 MG tablet Give 4 mg by mouth four times a day   No facility-administered encounter medications on file as of 10/10/2017.     Review of Systems   This is limited secondary to dementia.  In general she not complaining any fever chills does not complain of feeling ill.  Skin does not complain of rashes or  itching possibly has had some slight discoloration per nursing prior  Head ears eyes nose mouth and throat she not complaining of visual changes or sore throat.  Respiratory does not complain of shortness of breath or cough.  Cardiac denies chest pain does not have significant lower extremity edema.  GI is not complaining of any abdominal pain nausea vomiting diarrhea constipation has a history of recurrent vomiting but this has improved on the Zofran.  GU does not complain of dysuria but nursing staff feels she may have another UTI with confusion.  Musculoskeletal does not complain of joint pain.  Neurologic is not complaining of dizziness headache or numbness.  Psych does have a history of dementia depression which appears to be stable no acute changes from baseline other than having some  increased confusion at times  Immunization History  Administered Date(s) Administered  . Influenza-Unspecified 07/20/2016, 07/20/2017  . Pneumococcal Conjugate-13 06/12/2017  . Pneumococcal-Unspecified 07/24/2016  . Tdap 07/12/2017   Pertinent  Health Maintenance Due  Topic Date Due  . URINE MICROALBUMIN  11/10/2017 (Originally 06/03/1942)  . HEMOGLOBIN A1C  01/06/2018  . FOOT EXAM  02/20/2018  . OPHTHALMOLOGY EXAM  04/05/2018  . INFLUENZA VACCINE  Completed  . DEXA SCAN  Completed  . PNA vac Low Risk Adult  Completed   Fall Risk  07/12/2017  Falls in the past year? No   Functional Status Survey:    Vitals:   10/10/17 1311  BP: 131/75  Pulse: 75  Resp: 18  Temp: 98 F (36.7 C)  TempSrc: Oral  SpO2: 96%    weight is 124.8 pounds Physical Exam  In general this is a pleasant elderly female in no distress.  Her skin is warm and dry.--Possibly has a slight yellowish tinge although I would say this is somewhat borderline  Eyes sclera and conjunctive are clear visual acuity appears grossly intact Oropharynx is clear mucous membranes moist.   Chest is clear to auscultation there  is no labored breathing.  Heart is regular rate and rhythm without murmur gallop or rub she does not have significant lower extremity edema.  Her abdomen is soft it is not tender there are positive bowel sounds.  Musculoskeletal continues to move all her extremities at baseline she does ambulate in a wheelchair and appears to do well with this.  Neurologic is grossly intact her speech is clear I do not see any lateralizing findings or changes from baseline.  Psych she is oriented to self pleasant appropriate can carry on a short straightforward conversation  Labs reviewed: Recent Labs    11/22/16 0715 12/14/16 0730  08/22/17 0718 09/11/17 0728 10/10/17 0830  NA 140 140   < > 140 139 139  K 4.5 3.8   < > 4.1 3.8 3.8  CL 102 105   < > 102 104 103  CO2 30 28   < > 28 29 23   GLUCOSE 93 87   < >  98 95 79  BUN 11 6   < > 14 17 20   CREATININE 0.81 0.69   < > 0.90 0.90 0.85  CALCIUM 8.9 8.7*   < > 8.9 8.6* 9.0  MG  --  1.7  --   --   --   --   PHOS 3.5 3.2  --   --   --   --    < > = values in this interval not displayed.   Recent Labs    03/27/17 0741 04/23/17 0300 10/10/17 0830  AST 16 18 19   ALT 11* 10* 9*  ALKPHOS 64 53 61  BILITOT 0.5 0.5 0.6  PROT 6.6 6.5 6.7  ALBUMIN 3.0* 3.1* 3.7   Recent Labs    05/07/17 0600 07/09/17 0430 09/11/17 0728  WBC 6.2 4.9 5.3  NEUTROABS 2.9 1.7 2.9  HGB 11.7* 12.0 11.7*  HCT 36.3 36.1 37.0  MCV 94.3 94.0 95.6  PLT 223 209 256   Lab Results  Component Value Date   TSH 1.030 09/11/2017   Lab Results  Component Value Date   HGBA1C 5.5 07/09/2017   No results found for: CHOL, HDL, LDLCALC, LDLDIRECT, TRIG, CHOLHDL  Significant Diagnostic Results in last 30 days:  No results found.  Assessment/Plan  #1-increased confusion- she does have a history of UTIs with confusion as noted above will check a urinalysis and culture.  2.  History of a question jaundice I have ordered lab work including a CMP to check her liver  function tests- at this point will monitor clinically she appears asymptomatic without any abdominal pain-she appears to be at baseline.  Addendum we have received the updated lab work which is stable-- liver function tests are essentially within normal limits ALT is slightly down at 9 bilirubin total is within normal range at 0.6--direct bilirubin is 0.1  Lipase was also done which is in normal range.  At 46-amylase is also normal at 50.  Again will continue to monitor and await urine culture results and look for any changes with her skin tone but at this point appears to be baseline.  [id number]-of no greater than 25 minutes spent assessing patient-discussed her status with nursing staff-reviewing her chart-reviewing her labs- and formulating and coordinating plan of care-of note greater than 50% of time spent coordinating plan of care with input as noted above

## 2017-10-11 ENCOUNTER — Other Ambulatory Visit (HOSPITAL_COMMUNITY)
Admission: RE | Admit: 2017-10-11 | Discharge: 2017-10-11 | Disposition: A | Discharge status: Home / Self Care | Payer: Medicare Other | Source: Skilled Nursing Facility | Attending: Internal Medicine | Admitting: Internal Medicine

## 2017-10-11 DIAGNOSIS — R296 Repeated falls: Secondary | ICD-10-CM | POA: Diagnosis present

## 2017-10-11 DIAGNOSIS — Z9181 History of falling: Secondary | ICD-10-CM | POA: Insufficient documentation

## 2017-10-11 LAB — URINALYSIS, ROUTINE W REFLEX MICROSCOPIC
Bilirubin Urine: NEGATIVE
Glucose, UA: NEGATIVE mg/dL
Ketones, ur: NEGATIVE mg/dL
Nitrite: NEGATIVE
PH: 7 (ref 5.0–8.0)
Protein, ur: 30 mg/dL — AB
SPECIFIC GRAVITY, URINE: 1.013 (ref 1.005–1.030)

## 2017-10-14 LAB — URINE CULTURE: Culture: >=100000 — AB

## 2017-10-23 ENCOUNTER — Encounter: Payer: Self-pay | Admitting: Internal Medicine

## 2017-10-23 ENCOUNTER — Non-Acute Institutional Stay (SKILLED_NURSING_FACILITY): Payer: Medicare Other | Admitting: Internal Medicine

## 2017-10-23 DIAGNOSIS — I10 Essential (primary) hypertension: Secondary | ICD-10-CM

## 2017-10-23 DIAGNOSIS — M810 Age-related osteoporosis without current pathological fracture: Secondary | ICD-10-CM

## 2017-10-23 DIAGNOSIS — F331 Major depressive disorder, recurrent, moderate: Secondary | ICD-10-CM | POA: Diagnosis not present

## 2017-10-23 DIAGNOSIS — F0390 Unspecified dementia without behavioral disturbance: Secondary | ICD-10-CM | POA: Diagnosis not present

## 2017-10-23 DIAGNOSIS — K224 Dyskinesia of esophagus: Secondary | ICD-10-CM

## 2017-10-23 NOTE — Progress Notes (Signed)
Location:   [facility] Room Number: 115/W Place of Service:  SNF [phone]) Provider:  Lenon Curt, Freddie Breech, MD  Patient Care Team: [name], MD as PCP - General (Internal Medicine) Synetta Shadow as Physician Assistant (Internal Medicine)  Extended Emergency Contact Information Primary Emergency Contact: [name] [place] Home Phone: [phone] Relation: Son Secondary Emergency Contact: [name] [place] Home Phone: [phone] Mobile Phone: [phone] Relation: Son  Code Status:  Full Code Goals of care: Advanced Directive information Advanced Directives 10/23/2017  Does Patient Have a Medical Advance Directive? Yes  Type of Advance Directive (No Data)  Does patient want to make changes to medical advance directive? No - Patient declined  Copy of Healthcare Power of Attorney in Chart? No - copy requested  Pre-existing out of facility DNR order (yellow form or pink MOST form) -     Chief Complaint  Patient presents with  . Medical Management of Chronic Issues    Routine Visit,     HPI:  Pt is a 84 y.o. female seen today for medical management of chronic diseases.    Patient has H/O Hypertension, recurrent Vomiting, Falls, GERD and Dementia. And Osteoporosis Patient is a long-term resident of facility and has been mostly doing well.  She did have some increased confusion and fall with positive urine culture and was treated with antibiotics.   Patient's weight is stable at 123 pounds.  She does have some episodes of vomiting mainly when she does not like the food or if she overeats. Patient is unable to give me any history due to her dementia   Past Medical History:  Diagnosis Date  . Asthma   . Dementia   . Diabetes mellitus   . H. pylori infection MAR 2014  . Hypertension   . Stroke [facility])    Past Surgical History:  Procedure Laterality Date  . CHOLECYSTECTOMY    . COLECTOMY      secondary to diverticulitis  . ESOPHAGOGASTRODUODENOSCOPY (EGD) WITH ESOPHAGEAL DILATION N/A 01/07/2013   EXB:[id number] ring was found at the gastroesophageal junction/SINGLE DUODENAL DIVERTICULUM    No Known Allergies  Outpatient Encounter Medications as of 10/23/2017  Medication Sig  . acetaminophen (TYLENOL) 325 MG tablet Take 650 mg by mouth every 6 (six) hours as needed.  Marland Kitchen aspirin EC 81 MG tablet Take 81 mg by mouth daily.  [name] [place]-Castor Oil (VENELEX) OINT Apply to sacrum and bilateral buttocks q shift & prn every shift  . Calcium Carbonate-Vitamin D (CALCIUM 500/VITAMIN D PO) Take 500 mg by mouth 2 (two) times daily.  . citalopram (CELEXA) 20 MG tablet Take 20 mg by mouth daily.   Marland Kitchen denosumab (PROLIA) 60 MG/ML SOLN injection Inject 60 mg into the skin every 6 (six) months. Administer in upper arm, thigh, or abdomen  . ipratropium-albuterol (DUONEB) 0.5-2.5 (3) MG/3ML SOLN Give every 6 hours as needed prn for SOB and Wheezing  . lisinopril (PRINIVIL,ZESTRIL) 10 MG tablet Take 10 mg daily by mouth.  . loratadine (CLARITIN) 10 MG tablet Take 10 mg by mouth daily.  . memantine (NAMENDA) 10 MG tablet Take 10 mg by mouth 2 (two) times daily.  . Menthol, Topical Analgesic, (BIOFREEZE) 4 % GEL Apply prn to knees for pain management as needed daily  . mirtazapine (REMERON) 15 MG tablet Take 15 mg by mouth at bedtime.  Marland Kitchen omeprazole (PRILOSEC) 40 MG capsule Take 40 mg by mouth 2 (two)  times daily.  . ondansetron (ZOFRAN) 4 MG tablet Give 4 mg by mouth four times a day  . Probiotic Product (RISA-BID PROBIOTIC) TABS Take 1 tablet by mouth twice a day from 10/13/2017-10/23/2017   No facility-administered encounter medications on file as of 10/23/2017.      Review of Systems  Unable to perform ROS: Dementia    Immunization History  Administered Date(s) Administered  . Influenza-Unspecified 07/20/2016, 07/20/2017  . Pneumococcal Conjugate-13 06/12/2017  . Pneumococcal-Unspecified  07/24/2016  . Tdap 07/12/2017   Pertinent  Health Maintenance Due  Topic Date Due  . URINE MICROALBUMIN  11/10/2017 (Originally 06/03/1942)  . HEMOGLOBIN A1C  01/06/2018  . FOOT EXAM  02/20/2018  . OPHTHALMOLOGY EXAM  04/05/2018  . INFLUENZA VACCINE  Completed  . DEXA SCAN  Completed  . PNA vac Low Risk Adult  Completed   Fall Risk  07/12/2017  Falls in the past year? No   Functional Status Survey:    Vitals:   10/23/17 0923  BP: 115/72  Pulse: 72  Resp: 19  Temp: 97.9 F (36.6 C)  TempSrc: Oral  SpO2: 96%  Weight: 123 lb 12.8 oz (56.2 kg)  Height: 5\' 5"  (1.651 m)   Body mass index is 20.6 kg/m. Physical Exam  Constitutional: She appears well-developed and well-nourished.  HENT:  Head: Normocephalic.  Mouth/Throat: Oropharynx is clear and moist.  Eyes: Pupils are equal, round, and reactive to light.  Neck: Neck supple.  Cardiovascular: Normal rate and normal heart sounds.  No murmur heard. Pulmonary/Chest: Effort normal and breath sounds normal. No respiratory distress. She has no wheezes. She has no rales.  Abdominal: Soft. Bowel sounds are normal. She exhibits no distension. There is no tenderness. There is no rebound.  Musculoskeletal: She exhibits no edema.  Lymphadenopathy:    She has no cervical adenopathy.  Neurological: She is alert.  Skin: Skin is warm and dry.    Labs reviewed: Recent Labs    11/22/16 0715 12/14/16 0730  08/22/17 0718 09/11/17 0728 10/10/17 0830  NA 140 140   < > 140 139 139  K 4.5 3.8   < > 4.1 3.8 3.8  CL 102 105   < > 102 104 103  CO2 30 28   < > 28 29 23   GLUCOSE 93 87   < > 98 95 79  BUN 11 6   < > 14 17 20   CREATININE 0.81 0.69   < > 0.90 0.90 0.85  CALCIUM 8.9 8.7*   < > 8.9 8.6* 9.0  MG  --  1.7  --   --   --   --   PHOS 3.5 3.2  --   --   --   --    < > = values in this interval not displayed.   Recent Labs    03/27/17 0741 04/23/17 0300 10/10/17 0830  AST 16 18 19   ALT 11* 10* 9*  ALKPHOS 64 53 61    BILITOT 0.5 0.5 0.6  PROT 6.6 6.5 6.7  ALBUMIN 3.0* 3.1* 3.7   Recent Labs    05/07/17 0600 07/09/17 0430 09/11/17 0728  WBC 6.2 4.9 5.3  NEUTROABS 2.9 1.7 2.9  HGB 11.7* 12.0 11.7*  HCT 36.3 36.1 37.0  MCV 94.3 94.0 95.6  PLT 223 209 256   Lab Results  Component Value Date   TSH 1.030 09/11/2017   Lab Results  Component Value Date   HGBA1C 5.5 07/09/2017   No results found  for: CHOL, HDL, LDLCALC, LDLDIRECT, TRIG, CHOLHDL  Significant Diagnostic Results in last 30 days:  No results found.  Assessment/Plan Essential hypertension Patient's blood pressure still runs low and with her history of falling I would discontinue lisinopril.  We will continue to watch her her vitals.  Age-related osteoporosis Patient on Prolia and tolerating it well  Esophageal dysmotility She is stable on Aricept and Zofran before meals  Depression Patient stays on Remeron and Celexa  Dementia Stable on Namenda  Advanced care planning We will discussed with the nurses to see if patient's family would agree to discuss DNR  she is full code at this time   Family/ staff Communication:   Labs/tests ordered:

## 2017-11-13 ENCOUNTER — Encounter (HOSPITAL_COMMUNITY): Payer: Self-pay | Admitting: Emergency Medicine

## 2017-11-13 ENCOUNTER — Encounter: Payer: Self-pay | Admitting: Internal Medicine

## 2017-11-13 ENCOUNTER — Emergency Department (HOSPITAL_COMMUNITY)
Admission: EM | Admit: 2017-11-13 | Discharge: 2017-11-13 | Disposition: A | Discharge status: Home / Self Care | Payer: Medicare Other | Attending: Emergency Medicine | Admitting: Emergency Medicine

## 2017-11-13 ENCOUNTER — Inpatient Hospital Stay
Admission: RE | Admit: 2017-11-13 | Discharge: 2018-06-30 | Disposition: A | Discharge status: Other Acute Inpatient Hospital | Payer: Medicare Other | Source: Ambulatory Visit | Attending: Internal Medicine | Admitting: Internal Medicine

## 2017-11-13 ENCOUNTER — Other Ambulatory Visit: Payer: Self-pay

## 2017-11-13 ENCOUNTER — Non-Acute Institutional Stay (SKILLED_NURSING_FACILITY): Payer: Medicare Other | Admitting: Internal Medicine

## 2017-11-13 ENCOUNTER — Emergency Department (HOSPITAL_COMMUNITY): Payer: Medicare Other

## 2017-11-13 DIAGNOSIS — M79602 Pain in left arm: Secondary | ICD-10-CM | POA: Diagnosis not present

## 2017-11-13 DIAGNOSIS — E119 Type 2 diabetes mellitus without complications: Secondary | ICD-10-CM | POA: Diagnosis not present

## 2017-11-13 DIAGNOSIS — I10 Essential (primary) hypertension: Secondary | ICD-10-CM | POA: Diagnosis not present

## 2017-11-13 DIAGNOSIS — R079 Chest pain, unspecified: Secondary | ICD-10-CM | POA: Insufficient documentation

## 2017-11-13 DIAGNOSIS — Z8673 Personal history of transient ischemic attack (TIA), and cerebral infarction without residual deficits: Secondary | ICD-10-CM | POA: Diagnosis not present

## 2017-11-13 DIAGNOSIS — M25512 Pain in left shoulder: Secondary | ICD-10-CM | POA: Insufficient documentation

## 2017-11-13 DIAGNOSIS — J45909 Unspecified asthma, uncomplicated: Secondary | ICD-10-CM | POA: Insufficient documentation

## 2017-11-13 DIAGNOSIS — F0390 Unspecified dementia without behavioral disturbance: Secondary | ICD-10-CM | POA: Insufficient documentation

## 2017-11-13 LAB — BASIC METABOLIC PANEL
ANION GAP: 9 (ref 5–15)
BUN: 13 mg/dL (ref 6–20)
CALCIUM: 9.1 mg/dL (ref 8.9–10.3)
CO2: 28 mmol/L (ref 22–32)
Chloride: 102 mmol/L (ref 101–111)
Creatinine, Ser: 0.83 mg/dL (ref 0.44–1.00)
GFR calc Af Amer: >60 mL/min (ref >60)
GLUCOSE: 102 mg/dL — AB (ref 65–99)
POTASSIUM: 4.1 mmol/L (ref 3.5–5.1)
SODIUM: 139 mmol/L (ref 135–145)

## 2017-11-13 LAB — TROPONIN I

## 2017-11-13 LAB — CBC WITH DIFFERENTIAL/PLATELET
Basophils Absolute: 0 10*3/uL (ref 0.0–0.1)
Basophils Relative: 1 %
EOS ABS: 0.2 10*3/uL (ref 0.0–0.7)
EOS PCT: 3 %
HCT: 38.6 % (ref 36.0–46.0)
Hemoglobin: 12.1 g/dL (ref 12.0–15.0)
Lymphocytes Relative: 37 %
Lymphs Abs: 2.7 10*3/uL (ref 0.7–4.0)
MCH: 29.1 pg (ref 26.0–34.0)
MCHC: 31.3 g/dL (ref 30.0–36.0)
MCV: 92.8 fL (ref 78.0–100.0)
MONO ABS: 0.7 10*3/uL (ref 0.1–1.0)
Monocytes Relative: 9 %
Neutro Abs: 3.6 10*3/uL (ref 1.7–7.7)
Neutrophils Relative %: 50 %
PLATELETS: 263 10*3/uL (ref 150–400)
RBC: 4.16 MIL/uL (ref 3.87–5.11)
RDW: 13.7 % (ref 11.5–15.5)
WBC: 7.2 10*3/uL (ref 4.0–10.5)

## 2017-11-13 MED ORDER — ASPIRIN 81 MG PO CHEW
324.0000 mg | CHEWABLE_TABLET | Freq: Once | ORAL | Status: AC
  Administered 2017-11-13: 324 mg via ORAL
  Filled 2017-11-13: qty 4

## 2017-11-13 NOTE — ED Notes (Signed)
Report given to [facility], will send someone to pick pt up.

## 2017-11-13 NOTE — Discharge Instructions (Signed)
The x-ray of the left arm shows that there has been a prior fracture at the top of the arm which is causing the pain There is no signs of heart disease causing the pain today Return to the emergency department for severe or worsening symptoms see your doctor within 3 days for recheck

## 2017-11-13 NOTE — ED Provider Notes (Signed)
[facility] EMERGENCY DEPARTMENT Provider Note   CSN: [id number] Arrival date & time: 11/13/17  1149     History   Chief Complaint Chief Complaint  Patient presents with  . Chest Pain    HPI Sabrina [name] is a 84 y.o. female.  HPI  The patient is a very pleasant 84 year old female, she has a known history of diabetes high blood pressure prior stroke and has dementia, level 5 caveat applies secondary to dementia.  She is currently in a nursing facility and states that she has had pain for the last several days, and she was seen by the nurse practitioner this morning who evaluated her at the nursing home and sent her over for further testing.  The patient had reportedly been complaining intermittently of this, the patient states that she coughs all the time however the nursing staff has not appreciated much in the way of coughing or congestion.  Patient is unable to give much in the way of history regarding this chest pain.  Going back approximately 6 years there is no evidence of cardiology evaluation in the chart.  The patient is unable to tell me of any specific heart history.  Past Medical History:  Diagnosis Date  . Asthma   . Dementia   . Diabetes mellitus   . H. pylori infection MAR 2014  . Hypertension   . Stroke [facility])     Patient Active Problem List   Diagnosis Date Noted  . Pneumonia 12/27/2016  . Osteoporosis 11/21/2016  . Hypertension 09/26/2016  . Esophageal dysmotility 07/25/2016  . Depression 07/25/2016  . Dementia 07/06/2016  . Internal hemorrhoids with other complication 07/16/2013  . Rotator cuff syndrome of right shoulder 06/10/2013  . Fracture, humerus, proximal 04/08/2013  . Postural imbalance 02/19/2013  . Nausea with vomiting 01/06/2013  . Femur fracture (HCC) 04/07/2012    Past Surgical History:  Procedure Laterality Date  . CHOLECYSTECTOMY    . COLECTOMY     secondary to diverticulitis  . ESOPHAGOGASTRODUODENOSCOPY (EGD) WITH ESOPHAGEAL  DILATION N/A 01/07/2013   [id number] ring was found at the gastroesophageal junction/SINGLE DUODENAL DIVERTICULUM    OB History    Gravida Para Term Preterm AB Living             4   SAB TAB Ectopic Multiple Live Births                   Home Medications    Prior to Admission medications   Medication Sig Start Date End Date Taking? Authorizing Provider  acetaminophen (TYLENOL) 325 MG tablet Take 650 mg by mouth every 6 (six) hours as needed.   Yes [provider]  Amino Acids-Protein Hydrolys (FEEDING SUPPLEMENT, PRO-STAT SUGAR FREE 64,) LIQD Take 30 mLs by mouth 2 (two) times daily between meals.   Yes [provider]  aspirin EC 81 MG tablet Take 81 mg by mouth daily.   Yes [provider]  Despina [employer] [facility]) OINT Apply to sacrum and bilateral buttocks q shift & prn every shift   Yes [provider]  Calcium Carbonate-Vitamin D (CALCIUM 500/VITAMIN D PO) Take 500 mg by mouth 2 (two) times daily.   Yes [provider]  citalopram (CELEXA) 20 MG tablet Take 20 mg by mouth daily.    Yes [provider]  denosumab (PROLIA) 60 MG/ML SOLN injection Inject 60 mg into the skin every 6 (six) months. Administer in upper arm, thigh, or abdomen   Yes [provider]  loratadine (CLARITIN) 10 MG tablet Take 10 mg by mouth daily.   Yes [provider]  memantine (NAMENDA) 10 MG tablet Take 10 mg by mouth 2 (two) times daily.   Yes [provider]  Menthol, Topical Analgesic, (BIOFREEZE) 4 % GEL Apply prn to knees for pain management as needed daily   Yes [provider]  mirtazapine (REMERON) 15 MG tablet Take 15 mg by mouth at bedtime.   Yes [provider]  omeprazole (PRILOSEC) 40 MG capsule Take 40 mg by mouth 2 (two) times daily.   Yes [provider]  ondansetron (ZOFRAN) 4 MG tablet Give 4 mg by mouth four times a day   Yes [provider]    Family  History Family History  Problem Relation Age of Onset  . Colon cancer Neg Hx     Social History Social History   Tobacco Use  . Smoking status: Never Smoker  . Smokeless tobacco: Never Used  Substance Use Topics  . Alcohol use: No  . Drug use: No     Allergies   Patient has no known allergies.   Review of Systems Review of Systems  Unable to perform ROS: Dementia     Physical Exam Updated Vital Signs BP (!) 143/68 (BP Location: Right Arm)   Pulse 68   Resp 16   Ht 5\' 5"  (1.651 m)   Wt 55.8 kg (123 lb)   SpO2 98%   BMI 20.47 kg/m   Physical Exam  Constitutional: She appears well-developed and well-nourished. No distress.  HENT:  Head: Normocephalic and atraumatic.  Mouth/Throat: Oropharynx is clear and moist. No oropharyngeal exudate.  Eyes: Conjunctivae and EOM are normal. Pupils are equal, round, and reactive to light. Right eye exhibits no discharge. Left eye exhibits no discharge. No scleral icterus.  Neck: Normal range of motion. Neck supple. No JVD present. No thyromegaly present.  Cardiovascular: Normal rate, regular rhythm, normal heart sounds and intact distal pulses. Exam reveals no gallop and no friction rub.  No murmur heard. Pulmonary/Chest: Effort normal and breath sounds normal. No respiratory distress. She has no wheezes. She has no rales.  Abdominal: Soft. Bowel sounds are normal. She exhibits no distension and no mass. There is no tenderness.  Musculoskeletal: Normal range of motion. She exhibits no edema or tenderness.  Lymphadenopathy:    She has no cervical adenopathy.  Neurological: She is alert. Coordination normal.  Skin: Skin is warm and dry. No rash noted. No erythema.  Psychiatric: She has a normal mood and affect. Her behavior is normal.  Nursing note and vitals reviewed.    ED Treatments / Results  Labs (all labs ordered are listed, but only abnormal results are displayed) Labs Reviewed  BASIC METABOLIC PANEL - Abnormal;  Notable for the following components:      Result Value   Glucose, Bld 102 (*)    All other components within normal limits  TROPONIN I  CBC WITH DIFFERENTIAL/PLATELET  TROPONIN I    EKG  EKG Interpretation  Date/Time:  Tuesday November 13 2017 12:06:27 EST Ventricular Rate:  71 PR Interval:    QRS Duration: 86 QT Interval:  441 QTC Calculation: 480 R Axis:   -42 Text Interpretation:  Sinus arrhythmia Left anterior fascicular block Low voltage, precordial leads Anteroseptal infarct, old Poor R wave progression since last tracing no significant change (04/06/12) Confirmed by Eber HongMiller, Anesa Fronek ([id number]) on 11/13/2017 12:17:09 PM       Radiology Dg  Chest 2 View  Result Date: 11/13/2017 CLINICAL DATA:  Cough and chest pain EXAM: CHEST  2 VIEW COMPARISON:  05/25/2017 FINDINGS: Borderline cardiomegaly accentuated by pectus excavatum. Negative aortic and hilar contours. Chronic scar at the left base. There is no edema, consolidation, effusion, or pneumothorax. Remote left humeral neck fracture. Osteopenia. Cholecystectomy clips. IMPRESSION: Stable from prior.  No evidence of acute disease. Electronically Signed   By: Marnee Spring M.D.   On: 11/13/2017 13:13    Procedures Procedures (including critical care time)  Medications Ordered in ED Medications  aspirin chewable tablet 324 mg (324 mg Oral Given 11/13/17 1221)     Initial Impression / Assessment and Plan / ED Course  I have reviewed the triage vital signs and the nursing notes.  Pertinent labs & imaging results that were available during my care of the patient were reviewed by me and considered in my medical decision making (see chart for details).  Clinical Course as of Nov 13 1632  Tue Nov 13, 2017  1334 Labs reviewed showing normal troponin, normal blood counts, normal metabolic panel.  She will need a second troponin prior to discharge back to her facility.  [BM]  1335 I have personally viewed the 2 view PA and lateral chest  x-ray of Ms. Kilker, my interpretation is that she has chronic findings of her left proximal humerus and it appears she has had a chronic fracture there.  The chest x-ray itself shows no signs of acute infiltrate or pneumothorax.  [BM]    Clinical Course User Index [BM] Eber Hong, MD    The patient is able to give me very little information but appears comfortable and states that she really has no pain in her chest at this time.  She has pain in the left shoulder, this does seem to get worse somewhat with movement, I will obtain an x-ray of the chest, her EKG is totally unchanged, labs are pending as well.  Second troponin also undetectable, x-ray shows that it is likely a proximal humerus which is causing her chronic pain, status post fracture, family member here, has been given instructions, informed of results, the patient is stable for discharge, doubt acute coronary syndrome  Final Clinical Impressions(s) / ED Diagnoses   Final diagnoses:  Left sided chest pain  Left arm pain    ED Discharge Orders    None       Eber Hong, MD 11/13/17 [phone]

## 2017-11-13 NOTE — ED Triage Notes (Signed)
PT c/o left sided chest pain x3 days with no SOB. PT from [facility] and had one 81 mg aspirin this morning with her routine medication.

## 2017-11-13 NOTE — ED Notes (Signed)
Family and pt updated at this time.

## 2017-11-13 NOTE — Progress Notes (Signed)
Location:    [facility] Room Number: 115/W Place of Service:  SNF [phone]) Provider:  Sabino Dick, MD  Patient Care Team: [name], MD as PCP - General (Internal Medicine) Synetta Shadow as Physician Assistant (Internal Medicine)  Extended Emergency Contact Information Primary Emergency Contact: [name] [place] Home Phone: [phone] Relation: Son Secondary Emergency Contact: [name] [place] Home Phone: [phone] Mobile Phone: [phone] Relation: Son  Code Status:  Full Code Goals of care: Advanced Directive information Advanced Directives 11/13/2017  Does Patient Have a Medical Advance Directive? Yes  Type of Advance Directive (No Data)  Does patient want to make changes to medical advance directive? No - Patient declined  Copy of Healthcare Power of Attorney in Chart? No - copy requested  Pre-existing out of facility DNR order (yellow form or pink MOST form) -     Chief Complaint  Patient presents with  . Acute Visit    Patients c/o Pain in the left chest area    HPI:  Pt is a 84 y.o. female seen today for an acute visit for complaints of intermittent left upper chest discomfort.  Apparently she has been complaining of this off and on for the past couple days apparently there have been no complaints of radiation of this pain- there has been no nausea or vomiting-she has had nausea vomiting in the past this was thought to be more GI related   nursing staff has not really reported any increased cough or chest congestion    She is sitting comfortably in her wheelchair today vital signs are stable as well as O2 saturations--she is complaining however of some left upper chest discomfort intermittently.  Her medical issues include dementia hypertension GERD with recurrent vomiting falls and osteoporosis.     Past Medical History:  Diagnosis Date  . Asthma   .  Dementia   . Diabetes mellitus   . H. pylori infection MAR 2014  . Hypertension   . Stroke [facility])    Past Surgical History:  Procedure Laterality Date  . CHOLECYSTECTOMY    . COLECTOMY     secondary to diverticulitis  . ESOPHAGOGASTRODUODENOSCOPY (EGD) WITH ESOPHAGEAL DILATION N/A 01/07/2013   [id number] ring was found at the gastroesophageal junction/SINGLE DUODENAL DIVERTICULUM    No Known Allergies  Outpatient Encounter Medications as of 11/13/2017  Medication Sig  . acetaminophen (TYLENOL) 325 MG tablet Take 650 mg by mouth every 6 (six) hours as needed.  Marland Kitchen aspirin EC 81 MG tablet Take 81 mg by mouth daily.  [name] [place]-Castor Oil (VENELEX) OINT Apply to sacrum and bilateral buttocks q shift & prn every shift  . Calcium Carbonate-Vitamin D (CALCIUM 500/VITAMIN D PO) Take 500 mg by mouth 2 (two) times daily.  . citalopram (CELEXA) 20 MG tablet Take 20 mg by mouth daily.   Marland Kitchen denosumab (PROLIA) 60 MG/ML SOLN injection Inject 60 mg into the skin every 6 (six) months. Administer in upper arm, thigh, or abdomen  . loratadine (CLARITIN) 10 MG tablet Take 10 mg by mouth daily.  . memantine (NAMENDA) 10 MG tablet Take 10 mg by mouth 2 (two) times daily.  . Menthol, Topical Analgesic, (BIOFREEZE) 4 % GEL Apply prn to knees for pain management as needed daily  . mirtazapine (REMERON) 15 MG tablet Take 15 mg by mouth at bedtime.  Marland Kitchen omeprazole (PRILOSEC) 40 MG capsule Take 40 mg by mouth 2 (two)  times daily.  . ondansetron (ZOFRAN) 4 MG tablet Give 4 mg by mouth four times a day  . [DISCONTINUED] ipratropium-albuterol (DUONEB) 0.5-2.5 (3) MG/3ML SOLN Give every 6 hours as needed prn for SOB and Wheezing  . [DISCONTINUED] lisinopril (PRINIVIL,ZESTRIL) 10 MG tablet Take 10 mg daily by mouth.  . [DISCONTINUED] Probiotic Product (RISA-BID PROBIOTIC) TABS Take 1 tablet by mouth twice a day from 10/13/2017-10/23/2017   No facility-administered encounter medications on file as of 11/13/2017.      Review of Systems   This is limited secondary to dementia provided by nursing as well.  General she is not complaining of fever chills.  Skin no diaphoresis rashes or itching.  Head ears eyes nose mouth and throat does not complain of visual changes or sore throat.  Respiratory nursing is not really noted a cough although apparently at times she will develop a cough he does not complain of shortness of breath.  Cardiac complains of some left upper thorax discomfort-pain versus soreness.  GI is not complaining of abdominal pain no nausea or vomiting noted today.   GU does not complain of dysuria.  Musculoskeletal no complaints of joint pain.  Neurologic does not complain of dizziness headache syncope or numbness.  Psych does have a history of dementia but does not appear to be overtly anxious does have somewhat of a flat affect which is baseline   Immunization History  Administered Date(s) Administered  . Influenza-Unspecified 07/20/2016, 07/20/2017  . Pneumococcal Conjugate-13 06/12/2017  . Pneumococcal-Unspecified 07/24/2016  . Tdap 07/12/2017   Pertinent  Health Maintenance Due  Topic Date Due  . URINE MICROALBUMIN  12/14/2017 (Originally 06/03/1942)  . HEMOGLOBIN A1C  01/06/2018  . FOOT EXAM  02/20/2018  . OPHTHALMOLOGY EXAM  04/05/2018  . INFLUENZA VACCINE  Completed  . DEXA SCAN  Completed  . PNA vac Low Risk Adult  Completed   Fall Risk  07/12/2017  Falls in the past year? No   Functional Status Survey:    Vitals:   11/13/17 1040  BP: 125/71  Pulse: 67  Resp: 18  Temp: 98.4 F (36.9 C)  TempSrc: Oral  SpO2: 95%    Physical Exam  In general this is a pleasant elderly female in no distress sitting comfortably in her wheelchair.  Her skin is warm and dry she is not diaphoretic.  Eyes visual acuity appears grossly intact.  Oropharynx is clear mucous membranes moist  Chest is clear to auscultation with somewhat poor respiratory effort there is  no labored breathing. She reports mildly increased discomfort when left upper thorax is palpated  Heart is regular rate and rhythm without murmur gallop or rub She does not really have significant lower extremity edema.  Abdomen is soft does not appear to be tender there are positive bowel sounds.  Musculoskeletal moves her extremities at baseline with baseline lower extremity weakness could not really appreciate any focal deficits.  Neurologic as noted above she is alert and responds appropriately to simple verbal commands.  And psych she is oriented to self pleasant appropriate at her baseline she does speak some but fairly minimally   Labs reviewed: Recent Labs    11/22/16 0715 12/14/16 0730  08/22/17 0718 09/11/17 0728 10/10/17 0830  NA 140 140   < > 140 139 139  K 4.5 3.8   < > 4.1 3.8 3.8  CL 102 105   < > 102 104 103  CO2 30 28   < > 28 29 23   GLUCOSE  93 87   < > 98 95 79  BUN 11 6   < > 14 17 20   CREATININE 0.81 0.69   < > 0.90 0.90 0.85  CALCIUM 8.9 8.7*   < > 8.9 8.6* 9.0  MG  --  1.7  --   --   --   --   PHOS 3.5 3.2  --   --   --   --    < > = values in this interval not displayed.   Recent Labs    03/27/17 0741 04/23/17 0300 10/10/17 0830  AST 16 18 19   ALT 11* 10* 9*  ALKPHOS 64 53 61  BILITOT 0.5 0.5 0.6  PROT 6.6 6.5 6.7  ALBUMIN 3.0* 3.1* 3.7   Recent Labs    05/07/17 0600 07/09/17 0430 09/11/17 0728  WBC 6.2 4.9 5.3  NEUTROABS 2.9 1.7 2.9  HGB 11.7* 12.0 11.7*  HCT 36.3 36.1 37.0  MCV 94.3 94.0 95.6  PLT 223 209 256   Lab Results  Component Value Date   TSH 1.030 09/11/2017   Lab Results  Component Value Date   HGBA1C 5.5 07/09/2017   No results found for: CHOL, HDL, LDLCALC, LDLDIRECT, TRIG, CHOLHDL  Significant Diagnostic Results in last 30 days:  No results found.  Assessment/Plan  #1 left upper thorax chest discomfort- patient does not appear to be in any distress- apparently this has been somewhat intermittent over the  last couple days-we did attempt to do an EKG in the facility but this was  nondiagnostic and difficult to interpret- will send to the ER for further testing I suspect tEKG and troponin level-currently she is not in any distress vital signs are stable but with this intermittent chest pain would lwarrant  some further investigation.-- With some possibly increased tenderness with palpation this may be musculoskeletal-also could not rule out the respiratory etiology.  [id number]

## 2017-11-14 ENCOUNTER — Non-Acute Institutional Stay (SKILLED_NURSING_FACILITY): Payer: Medicare Other | Admitting: Internal Medicine

## 2017-11-14 ENCOUNTER — Encounter: Payer: Self-pay | Admitting: Internal Medicine

## 2017-11-14 DIAGNOSIS — S42294D Other nondisplaced fracture of upper end of right humerus, subsequent encounter for fracture with routine healing: Secondary | ICD-10-CM

## 2017-11-14 DIAGNOSIS — I10 Essential (primary) hypertension: Secondary | ICD-10-CM | POA: Diagnosis not present

## 2017-11-14 DIAGNOSIS — R112 Nausea with vomiting, unspecified: Secondary | ICD-10-CM

## 2017-11-14 DIAGNOSIS — F0390 Unspecified dementia without behavioral disturbance: Secondary | ICD-10-CM

## 2017-11-14 DIAGNOSIS — M810 Age-related osteoporosis without current pathological fracture: Secondary | ICD-10-CM

## 2017-11-14 NOTE — Progress Notes (Signed)
Location:   [facility] Room Number: 115/W Place of Service:  SNF [phone]) Provider:  Sabino Dick, MD  Patient Care Team: [name], MD as PCP - General (Internal Medicine) Synetta Shadow as Physician Assistant (Internal Medicine)  Extended Emergency Contact Information Primary Emergency Contact: [name] [place] Home Phone: [phone] Relation: Son Secondary Emergency Contact: [name] [place] Home Phone: [phone] Mobile Phone: [phone] Relation: Son  Code Status:  Full Code Goals of care: Advanced Directive information Advanced Directives 11/14/2017  Does Patient Have a Medical Advance Directive? Yes  Type of Advance Directive (No Data)  Does patient want to make changes to medical advance directive? No - Patient declined  Copy of Healthcare Power of Attorney in Chart? No - copy requested  Pre-existing out of facility DNR order (yellow form or pink MOST form) -     Chief Complaint  Patient presents with  . Medical Management of Chronic Issues    Routine Visit, Due DM Albumin  . Acute Visit    F/U ED Visit  Medical management of chronic medical conditions including dementia-recurrent vomiting-weight loss-GERD- osteoporosis-esophageal dysmotility  HPI:  Pt is a 84 y.o. female seen today for medical management of chronic diseases.  As noted above.  She was seen yesterday for some vague complaints of intermittent left thorax discomfort-she was sent to the ER where workup did not show any significant cardiac etiology-her troponins were negative EKG did not show any changes- lab work was [employer] was thought this was most likely coming from chronic left proximal fracture-she is not complaining of pain today and does have Tylenol if needed.  In regards to her chronic medical conditions these appear to be fairly stable at that time she has had recurrent vomiting but this  has improved she is on Zofran before meals-there is thought sometimes she overeats and sometimes just does not like the food and has a vomiting episode.  Her weight has stabilized at about 123 pounds she is on Remeron.  At one point she had been on an ACE inhibitor for hypertension but this was discontinued because of concerns of occasional low blood pressures and falls nonetheless her blood pressure appears to be stable 132/72 manually today see previous readings 115/74-117/65.  Currently she is sitting in her wheelchair comfortably is pleasant somewhat confused but very cooperative with exams nursing does not report any behaviors she is on Namenda.  She is also on Celexa for coexistent depression.   -   Past Medical History:  Diagnosis Date  . Asthma   . Dementia   . Diabetes mellitus   . H. pylori infection MAR 2014  . Hypertension   . Stroke [facility])    Past Surgical History:  Procedure Laterality Date  . CHOLECYSTECTOMY    . COLECTOMY     secondary to diverticulitis  . ESOPHAGOGASTRODUODENOSCOPY (EGD) WITH ESOPHAGEAL DILATION N/A 01/07/2013   QIO:NGEXBMWU ring was found at the gastroesophageal junction/SINGLE DUODENAL DIVERTICULUM    No Known Allergies  Outpatient Encounter Medications as of 11/14/2017  Medication Sig  . acetaminophen (TYLENOL) 325 MG tablet Take 650 mg by mouth every 6 (six) hours as needed.  . Amino Acids-Protein Hydrolys (FEEDING SUPPLEMENT, PRO-STAT SUGAR FREE 64,) LIQD Take 30 mLs by mouth 2 (two) times daily between meals.  Marland Kitchen aspirin EC 81 MG tablet Take 81 mg by mouth daily.  [name]-Castor Oil (VENELEX) OINT Apply to sacrum  and bilateral buttocks q shift & prn every shift  . Calcium Carbonate-Vitamin D (CALCIUM 500/VITAMIN D PO) Take 500 mg by mouth 2 (two) times daily.  . citalopram (CELEXA) 20 MG tablet Take 20 mg by mouth daily.   Marland Kitchen. denosumab (PROLIA) 60 MG/ML SOLN injection Inject 60 mg into the skin every 6 (six) months. Administer in  upper arm, thigh, or abdomen  . loratadine (CLARITIN) 10 MG tablet Take 10 mg by mouth daily.  . memantine (NAMENDA) 10 MG tablet Take 10 mg by mouth 2 (two) times daily.  . Menthol, Topical Analgesic, (BIOFREEZE) 4 % GEL Apply prn to knees for pain management as needed daily  . mirtazapine (REMERON) 15 MG tablet Take 15 mg by mouth at bedtime.  Marland Kitchen. omeprazole (PRILOSEC) 40 MG capsule Take 40 mg by mouth 2 (two) times daily.  . ondansetron (ZOFRAN) 4 MG tablet Give 4 mg by mouth four times a day   No facility-administered encounter medications on file as of 11/14/2017.      Review of Systems   This is limited secondary to dementia but she is not complaining of any thorax pain this morning does not complain of any shortness of breath or chest pain abdominal discomfort nausea or vomiting nursing staff has not reported any issues this morning  Immunization History  Administered Date(s) Administered  . Influenza-Unspecified 07/20/2016, 07/20/2017  . Pneumococcal Conjugate-13 06/12/2017  . Pneumococcal-Unspecified 07/24/2016  . Tdap 07/12/2017   Pertinent  Health Maintenance Due  Topic Date Due  . URINE MICROALBUMIN  12/14/2017 (Originally 06/03/1942)  . HEMOGLOBIN A1C  01/06/2018  . FOOT EXAM  02/20/2018  . OPHTHALMOLOGY EXAM  04/05/2018  . INFLUENZA VACCINE  Completed  . DEXA SCAN  Completed  . PNA vac Low Risk Adult  Completed   Fall Risk  07/12/2017  Falls in the past year? No   Functional Status Survey:    She is afebrile pulse of 80 respirations of 17 blood pressure taken manually 132/72 weight is stable at 123.8 pounds  Physical Exam Routine in general this is a pleasant elderly female in no distress sitting comfortably in her wheelchair.  Her skin is warm and dry.  Oropharynx is clear mucous membranes moist.  Eyes she has prescription lenses visual acuity appears grossly intact sclera and conjunctive are clear.  Chest is clear to auscultation with somewhat shallow air  entry there is no labored breathing.  Heart is regular rate and rhythm without murmur gallop or rub.  She has scant lower extremity edema.  Her abdomen soft nontender with positive bowel sounds.  Musculoskeletal moves all extremities x4 with some lower extremity weakness which is baseline could not really appreciate pain with palpation of her left thorax or left upper arm I do not note any deformity has some limited range of motion of her left.  Neurologic is grossly intact her speech is clear no lateralizing findings.  Psych she is oriented to self pleasant appropriateLabs reviewed: Recent Labs    11/22/16 0715 12/14/16 0730  09/11/17 0728 10/10/17 0830 11/13/17 1212  NA 140 140   < > 139 139 139  K 4.5 3.8   < > 3.8 3.8 4.1  CL 102 105   < > 104 103 102  CO2 30 28   < > 29 23 28   GLUCOSE 93 87   < > 95 79 102*  BUN 11 6   < > 17 20 13   CREATININE 0.81 0.69   < > 0.90  0.85 0.83  CALCIUM 8.9 8.7*   < > 8.6* 9.0 9.1  MG  --  1.7  --   --   --   --   PHOS 3.5 3.2  --   --   --   --    < > = values in this interval not displayed.   Recent Labs    03/27/17 0741 04/23/17 0300 10/10/17 0830  AST 16 18 19   ALT 11* 10* 9*  ALKPHOS 64 53 61  BILITOT 0.5 0.5 0.6  PROT 6.6 6.5 6.7  ALBUMIN 3.0* 3.1* 3.7   Recent Labs    07/09/17 0430 09/11/17 0728 11/13/17 1212  WBC 4.9 5.3 7.2  NEUTROABS 1.7 2.9 3.6  HGB 12.0 11.7* 12.1  HCT 36.1 37.0 38.6  MCV 94.0 95.6 92.8  PLT 209 256 263   Lab Results  Component Value Date   TSH 1.030 09/11/2017   Lab Results  Component Value Date   HGBA1C 5.5 07/09/2017   No results found for: CHOL, HDL, LDLCALC, LDLDIRECT, TRIG, CHOLHDL  Significant Diagnostic Results in last 30 days:  Dg Chest 2 View  Result Date: 11/13/2017 CLINICAL DATA:  Cough and chest pain EXAM: CHEST  2 VIEW COMPARISON:  05/25/2017 FINDINGS: Borderline cardiomegaly accentuated by pectus excavatum. Negative aortic and hilar contours. Chronic scar at the left  base. There is no edema, consolidation, effusion, or pneumothorax. Remote left humeral neck fracture. Osteopenia. Cholecystectomy clips. IMPRESSION: Stable from prior.  No evidence of acute disease. Electronically Signed   By: Marnee Spring M.D.   On: 11/13/2017 13:13    Assessment/Plan  #1-left thorax discomfort again she did go to the ER cardiac etiology was thought to be quite unlikely any discomfort was probably from an old proximal humerus fracture she does have Tylenol as needed for pain she appears to be comfortable today will monitor.  2.  History of dementia this appears stable with supportive care her weight is stabilized she is on Remeron continues on Namenda-.  3.  History of weight loss again this appears to have stabilized she is on Remeron.  4.  History of recurrent vomiting this has gotten better since Zofran routinely was initiated her weight has stabilized at this point will monitor.  She does continue on a PPI with a history of GERD as well.  5.  History of osteoporosis she is on Prolia as well as calcium.  6.  Depression she is on Celexa this appears to be stable has somewhat of a flat affect at times but appears to be stable pleasant appropriate smiling whenever I see her  #7 hypertension this appears stable despite being off medications lisinopril was discontinued because of concerns of low blood pressures and history of falls.  [id number].

## 2017-11-16 ENCOUNTER — Encounter (HOSPITAL_COMMUNITY)
Admission: RE | Admit: 2017-11-16 | Discharge: 2017-11-16 | Disposition: A | Discharge status: Home / Self Care | Payer: Medicare Other | Source: Skilled Nursing Facility | Attending: Internal Medicine | Admitting: Internal Medicine

## 2017-11-16 DIAGNOSIS — M6281 Muscle weakness (generalized): Secondary | ICD-10-CM | POA: Diagnosis present

## 2017-11-16 DIAGNOSIS — Z5189 Encounter for other specified aftercare: Secondary | ICD-10-CM | POA: Insufficient documentation

## 2017-11-16 DIAGNOSIS — E875 Hyperkalemia: Secondary | ICD-10-CM | POA: Diagnosis present

## 2017-11-16 DIAGNOSIS — F0390 Unspecified dementia without behavioral disturbance: Secondary | ICD-10-CM | POA: Insufficient documentation

## 2017-11-16 DIAGNOSIS — R1312 Dysphagia, oropharyngeal phase: Secondary | ICD-10-CM | POA: Insufficient documentation

## 2017-11-16 DIAGNOSIS — Z9181 History of falling: Secondary | ICD-10-CM | POA: Diagnosis not present

## 2017-11-16 DIAGNOSIS — E119 Type 2 diabetes mellitus without complications: Secondary | ICD-10-CM | POA: Diagnosis not present

## 2017-11-16 DIAGNOSIS — R279 Unspecified lack of coordination: Secondary | ICD-10-CM | POA: Insufficient documentation

## 2017-11-16 LAB — MAGNESIUM: MAGNESIUM: 1.8 mg/dL (ref 1.7–2.4)

## 2017-11-16 LAB — PHOSPHORUS: Phosphorus: 3.9 mg/dL (ref 2.5–4.6)

## 2017-11-17 LAB — VITAMIN D 25 HYDROXY (VIT D DEFICIENCY, FRACTURES): Vit D, 25-Hydroxy: 39.4 ng/mL (ref 30.0–100.0)

## 2017-11-17 LAB — CALCIUM, IONIZED: Calcium, Ionized, Serum: 5 mg/dL (ref 4.5–5.6)

## 2017-12-11 ENCOUNTER — Non-Acute Institutional Stay (SKILLED_NURSING_FACILITY): Payer: Medicare Other | Admitting: Internal Medicine

## 2017-12-11 ENCOUNTER — Encounter: Payer: Self-pay | Admitting: Internal Medicine

## 2017-12-11 DIAGNOSIS — R112 Nausea with vomiting, unspecified: Secondary | ICD-10-CM | POA: Diagnosis not present

## 2017-12-11 DIAGNOSIS — R634 Abnormal weight loss: Secondary | ICD-10-CM

## 2017-12-11 DIAGNOSIS — K224 Dyskinesia of esophagus: Secondary | ICD-10-CM | POA: Diagnosis not present

## 2017-12-11 DIAGNOSIS — F0390 Unspecified dementia without behavioral disturbance: Secondary | ICD-10-CM | POA: Diagnosis not present

## 2017-12-11 DIAGNOSIS — M810 Age-related osteoporosis without current pathological fracture: Secondary | ICD-10-CM | POA: Diagnosis not present

## 2017-12-11 NOTE — Progress Notes (Signed)
Location:   [facility] Room Number: 115/W Place of Service:  SNF [phone]) Provider:  Sabino Dick, MD  Patient Care Team: [name], MD as PCP - General (Internal Medicine) Synetta Shadow as Physician Assistant (Internal Medicine)  Extended Emergency Contact Information Primary Emergency Contact: [name] [place] Home Phone: [phone] Relation: Son Secondary Emergency Contact: [name] [place] Home Phone: [phone] Mobile Phone: [phone] Relation: Son  Code Status:  Full Code Goals of care: Advanced Directive information Advanced Directives 12/11/2017  Does Patient Have a Medical Advance Directive? Yes  Type of Advance Directive (No Data)  Does patient want to make changes to medical advance directive? No - Patient declined  Copy of Healthcare Power of Attorney in Chart? No - copy requested  Pre-existing out of facility DNR order (yellow form or pink MOST form) -    Chief complaint- routine visit for medical management of chronic medical issues including weight loss- esophageal dysmotility-dementia-recurrent vomiting-GERD-osteoporosis   HPI:  Pt is a 84 y.o. female seen today for medical management of chronic diseases.  As noted above.  She is enjoying a period of extended stability.  Most recent acute issue was approximately a month ago she went to the ER with vague complaints of intermittent left chest pain workup did not show any significant cardiac etiology troponins were negative EKG did not show any changes lab work was unremarkable.  Was thought this was most likely coming from a chronic left proximal fracture she is not currently complaining of pain there today she has Tylenol if needed.  Her other medical conditions appear to be relatively stable she had at one point recurrent vomiting which was a concern but this has improved on the routine Zofran before meals she  is also on a proton pump inhibitor.  There are thought she overeats and sometimes but that prompted a vomiting episode but again this is stabilized.  One point she had fairly significant weight loss but this has rebounded back and stabilized at about 124 pounds.  Regards to hypertension she had been on an ACE inhibitor but there was concern about hypotension and falls and this was discontinued nonetheless blood pressures appear to be good I got 114/74 manually tonight I see previous listing of 118/75.  In regards to osteoporosis again with a history of that fracture she is on Prolia as well as calcium and again she does not really appear to be having pain at this time    Past Medical History:  Diagnosis Date  . Asthma   . Dementia   . Diabetes mellitus   . H. pylori infection MAR 2014  . Hypertension   . Stroke [facility])    Past Surgical History:  Procedure Laterality Date  . CHOLECYSTECTOMY    . COLECTOMY     secondary to diverticulitis  . ESOPHAGOGASTRODUODENOSCOPY (EGD) WITH ESOPHAGEAL DILATION N/A 01/07/2013   QIO:NGEXBMWU ring was found at the gastroesophageal junction/SINGLE DUODENAL DIVERTICULUM    No Known Allergies  Outpatient Encounter Medications as of 12/11/2017  Medication Sig  . acetaminophen (TYLENOL) 325 MG tablet Take 650 mg by mouth every 6 (six) hours as needed.  . Amino Acids-Protein Hydrolys (FEEDING SUPPLEMENT, PRO-STAT SUGAR FREE 64,) LIQD Take 30 mLs by mouth 2 (two) times daily between meals.  Marland Kitchen aspirin EC 81 MG tablet Take 81 mg by mouth daily.  [name]-Castor Oil (VENELEX) OINT Apply to sacrum and bilateral  buttocks q shift & prn every shift  . Calcium Carbonate-Vitamin D (CALCIUM 500/VITAMIN D PO) Take 500 mg by mouth 2 (two) times daily.  . citalopram (CELEXA) 20 MG tablet Take 20 mg by mouth daily.   Marland Kitchen denosumab (PROLIA) 60 MG/ML SOLN injection Inject 60 mg into the skin every 6 (six) months. Administer in upper arm, thigh, or abdomen  .  loratadine (CLARITIN) 10 MG tablet Take 10 mg by mouth daily.  . memantine (NAMENDA) 10 MG tablet Take 10 mg by mouth 2 (two) times daily.  . Menthol, Topical Analgesic, (BIOFREEZE) 4 % GEL Apply prn to knees for pain management as needed daily  . mirtazapine (REMERON) 15 MG tablet Take 15 mg by mouth at bedtime.  Marland Kitchen omeprazole (PRILOSEC) 40 MG capsule Take 40 mg by mouth 2 (two) times daily.  . ondansetron (ZOFRAN) 4 MG tablet Give 4 mg by mouth four times a day   No facility-administered encounter medications on file as of 12/11/2017.      Review of Systems   This is very limited secondary to dementia but she has no complaints tonight the only request she has is that she would like to feel stronger but according to nursing she is actually been doing quite well.--He does not complain of any cough pain or shortness of breath    Immunization History  Administered Date(s) Administered  . Influenza-Unspecified 07/20/2016, 07/20/2017  . Pneumococcal Conjugate-13 06/12/2017  . Pneumococcal-Unspecified 07/24/2016  . Tdap 07/12/2017   Pertinent  Health Maintenance Due  Topic Date Due  . URINE MICROALBUMIN  12/14/2017 (Originally 06/03/1942)  . HEMOGLOBIN A1C  01/06/2018  . FOOT EXAM  02/20/2018  . OPHTHALMOLOGY EXAM  04/05/2018  . INFLUENZA VACCINE  Completed  . DEXA SCAN  Completed  . PNA vac Low Risk Adult  Completed   Fall Risk  07/12/2017  Falls in the past year? No   Functional Status Survey:    Vitals:   12/11/17 1525  BP: 118/75  Pulse: 68  Resp: 20  Temp: (!) 97 F (36.1 C)  TempSrc: Oral  SpO2: 96%  Weight: 124 lb 3.2 oz (56.3 kg)  Height: 5\' 5"  (1.651 m)  Of note manual blood pressure was 114/74 Body mass index is 20.67 kg/m. Physical Exam  In general this is a pleasant elderly female in no distress sitting comfortably in her wheelchair.  Her skin continues to be warm and dry she has some small chronic bruising areas.   Eyes visual acuity is intact she has  prescription lenses sclera and conjunctive are remain clear  Oropharynx is clear mucous membranes moist.  Chest is clear to auscultation there is no labored breathing.  Heart is regular rate and rhythm without murmur gallop or rub she has quite minimal lower extremity edema.  Her abdomen is soft with active bowel sounds and nontender.  Musculoskeletal moves all extremities x4 with baseline lower extremity weakness she does ambulate in a wheelchair she does have continued somewhat limited range of motion on the left shoulder area.  Neurologic is grossly intact her speech is clear there are no lateralizing findings  Psych she is oriented to self can carry on a short conversation she is pleasant and appropriate smiling with a very nice disposition     Labs reviewed: Recent Labs    12/14/16 0730  09/11/17 0728 10/10/17 0830 11/13/17 1212 11/16/17 0700  NA 140   < > 139 139 139  --   K 3.8   < >  3.8 3.8 4.1  --   CL 105   < > 104 103 102  --   CO2 28   < > 29 23 28   --   GLUCOSE 87   < > 95 79 102*  --   BUN 6   < > 17 20 13   --   CREATININE 0.69   < > 0.90 0.85 0.83  --   CALCIUM 8.7*   < > 8.6* 9.0 9.1  --   MG 1.7  --   --   --   --  1.8  PHOS 3.2  --   --   --   --  3.9   < > = values in this interval not displayed.   Recent Labs    03/27/17 0741 04/23/17 0300 10/10/17 0830  AST 16 18 19   ALT 11* 10* 9*  ALKPHOS 64 53 61  BILITOT 0.5 0.5 0.6  PROT 6.6 6.5 6.7  ALBUMIN 3.0* 3.1* 3.7   Recent Labs    07/09/17 0430 09/11/17 0728 11/13/17 1212  WBC 4.9 5.3 7.2  NEUTROABS 1.7 2.9 3.6  HGB 12.0 11.7* 12.1  HCT 36.1 37.0 38.6  MCV 94.0 95.6 92.8  PLT 209 256 263   Lab Results  Component Value Date   TSH 1.030 09/11/2017   Lab Results  Component Value Date   HGBA1C 5.5 07/09/2017   No results found for: CHOL, HDL, LDLCALC, LDLDIRECT, TRIG, CHOLHDL  Significant Diagnostic Results in last 30 days:  Dg Chest 2 View  Result Date: 11/13/2017 CLINICAL  DATA:  Cough and chest pain EXAM: CHEST  2 VIEW COMPARISON:  05/25/2017 FINDINGS: Borderline cardiomegaly accentuated by pectus excavatum. Negative aortic and hilar contours. Chronic scar at the left base. There is no edema, consolidation, effusion, or pneumothorax. Remote left humeral neck fracture. Osteopenia. Cholecystectomy clips. IMPRESSION: Stable from prior.  No evidence of acute disease. Electronically Signed   By: Marnee SpringJonathon  Watts M.D.   On: 11/13/2017 13:13    Assessment/Plan  #1 history of dementia she appears to be doing well with supportive care initially has some weight loss but she has rebounded she is on Remeron for appetite stimulation and is on Namenda as well with her history of dementia.  2.  History of recurrent vomiting again this has improved significantly with the routine Zofran at meals this probably led to some weight loss initially but with his resolution has come increased weight which is encouraging-her albumin actually was 3.7 back in December.  3.  Weight loss again this has largely resolved as noted above.  4.  History of esophageal dysmotility again this has largely improved with the Zofran as well as a proton pump inhibitor with some suspicions of a GERD etiology as well.  5.  History of hypertension no longer on any meds nonetheless as noted above blood pressures appear to be quite good at one point was on an ACE inhibitor was discontinued because of hypotension and fall concerns.  6.  History of osteoporosis continues on Prolia calcium.  7.  History of depression she is on Celexa again this is in addition to the Remeron she is on for appetite stimulation which appears to be helping.  [id number]

## 2018-02-07 ENCOUNTER — Non-Acute Institutional Stay (SKILLED_NURSING_FACILITY): Payer: Medicare Other | Admitting: Internal Medicine

## 2018-02-07 ENCOUNTER — Encounter: Payer: Self-pay | Admitting: Internal Medicine

## 2018-02-07 DIAGNOSIS — M810 Age-related osteoporosis without current pathological fracture: Secondary | ICD-10-CM | POA: Diagnosis not present

## 2018-02-07 DIAGNOSIS — K224 Dyskinesia of esophagus: Secondary | ICD-10-CM | POA: Diagnosis not present

## 2018-02-07 DIAGNOSIS — F331 Major depressive disorder, recurrent, moderate: Secondary | ICD-10-CM

## 2018-02-07 DIAGNOSIS — I10 Essential (primary) hypertension: Secondary | ICD-10-CM

## 2018-02-07 NOTE — Progress Notes (Signed)
Location:   [facility] Room Number: 115/W Place of Service:  SNF [phone]) Provider:  Pollyann Savoy, MD  Patient Care Team: [name], MD as PCP - General (Internal Medicine) Synetta Shadow as Physician Assistant (Internal Medicine)  Extended Emergency Contact Information Primary Emergency Contact: [name] [place] Home Phone: [phone] Relation: Son Secondary Emergency Contact: [name] [place] Home Phone: [phone] Mobile Phone: [phone] Relation: Son  Code Status:  Full Code Goals of care: Advanced Directive information Advanced Directives 02/07/2018  Does Patient Have a Medical Advance Directive? Yes  Type of Advance Directive (No Data)  Does patient want to make changes to medical advance directive? No - Patient declined  Copy of Healthcare Power of Attorney in Chart? No - copy requested  Pre-existing out of facility DNR order (yellow form or pink MOST form) -     Chief Complaint  Patient presents with  . Medical Management of Chronic Issues    Routine Visit    HPI:  Pt is a 84 y.o. female seen today for medical management of chronic diseases.   Patient has H/O Hypertension, recurrent Vomiting, Falls, GERD and Dementia. And Osteoporosis Patient is a long-term resident of facility and is mostly doing well.  Her weight has actually been up to 127 pounds..  She does not have recurrent vomiting anymore .Speech is working with her  to upgrade her diet. Her mood is also been stable. She is unable to give me any history due to her dementia  Past Medical History:  Diagnosis Date  . Asthma   . Dementia   . Diabetes mellitus   . H. pylori infection MAR 2014  . Hypertension   . Stroke [facility])    Past Surgical History:  Procedure Laterality Date  . CHOLECYSTECTOMY    . COLECTOMY     secondary to diverticulitis  . ESOPHAGOGASTRODUODENOSCOPY (EGD) WITH ESOPHAGEAL  DILATION N/A 01/07/2013   [id number]:[id number] ring was found at the gastroesophageal junction/SINGLE DUODENAL DIVERTICULUM    No Known Allergies  Outpatient Encounter Medications as of 02/07/2018  Medication Sig  . acetaminophen (TYLENOL) 325 MG tablet Take 650 mg by mouth every 6 (six) hours as needed.  . Amino Acids-Protein Hydrolys (FEEDING SUPPLEMENT, PRO-STAT SUGAR FREE 64,) LIQD Take 30 mLs by mouth 2 (two) times daily between meals.  Marland Kitchen aspirin EC 81 MG tablet Take 81 mg by mouth daily.  [name] [place]-Castor Oil (VENELEX) OINT Apply to sacrum and bilateral buttocks q shift & prn every shift  . Calcium Carbonate-Vitamin D (CALCIUM 500/VITAMIN D PO) Take 500 mg by mouth 2 (two) times daily.  . citalopram (CELEXA) 20 MG tablet Take 20 mg by mouth daily.   Marland Kitchen denosumab (PROLIA) 60 MG/ML SOLN injection Inject 60 mg into the skin every 6 (six) months. Administer in upper arm, thigh, or abdomen  . loratadine (CLARITIN) 10 MG tablet Take 10 mg by mouth daily.  . memantine (NAMENDA) 10 MG tablet Take 10 mg by mouth 2 (two) times daily.  . Menthol, Topical Analgesic, (BIOFREEZE) 4 % GEL Apply prn to knees for pain management as needed daily  . mirtazapine (REMERON) 15 MG tablet Take 15 mg by mouth at bedtime.  Marland Kitchen omeprazole (PRILOSEC) 40 MG capsule Take 40 mg by mouth 2 (two) times daily.  . ondansetron (ZOFRAN) 4 MG tablet Give 4 mg by mouth four times a day   No facility-administered encounter medications on  file as of 02/07/2018.      Review of Systems  Unable to perform ROS: Dementia    Immunization History  Administered Date(s) Administered  . Influenza-Unspecified 07/20/2016, 07/20/2017  . Pneumococcal Conjugate-13 06/12/2017  . Pneumococcal-Unspecified 07/24/2016  . Tdap 07/12/2017   Pertinent  Health Maintenance Due  Topic Date Due  . HEMOGLOBIN A1C  03/09/2018 (Originally 01/06/2018)  . URINE MICROALBUMIN  03/09/2018 (Originally 06/03/1942)  . FOOT EXAM  02/20/2018  . OPHTHALMOLOGY  EXAM  04/05/2018  . INFLUENZA VACCINE  05/16/2018  . DEXA SCAN  Completed  . PNA vac Low Risk Adult  Completed   Fall Risk  07/12/2017  Falls in the past year? No   Functional Status Survey:    Vitals:   02/07/18 1238  BP: 119/67  Pulse: 67  Resp: 18  Temp: 97.9 F (36.6 C)  TempSrc: Oral  SpO2: 96%  Weight: 127 lb (57.6 kg)  Height: 5\' 5"  (1.651 m)   Body mass index is 21.13 kg/m. Physical Exam  Constitutional: She appears well-developed and well-nourished.  HENT:  Head: Normocephalic.  Mouth/Throat: Oropharynx is clear and moist.  Eyes: Pupils are equal, round, and reactive to light.  Neck: Neck supple.  Cardiovascular: Normal rate and regular rhythm.  No murmur heard. Pulmonary/Chest: Effort normal and breath sounds normal. No stridor. No respiratory distress. She has no wheezes.  Abdominal: Soft. Bowel sounds are normal. She exhibits no distension. There is no tenderness. There is no guarding.  Musculoskeletal: She exhibits no edema.  Lymphadenopathy:    She has no cervical adenopathy.  Neurological: She is alert.  Skin: Skin is warm and dry.  Psychiatric: She has a normal mood and affect. Her behavior is normal.    Labs reviewed: Recent Labs    09/11/17 0728 10/10/17 0830 11/13/17 1212 11/16/17 0700  NA 139 139 139  --   K 3.8 3.8 4.1  --   CL 104 103 102  --   CO2 29 23 28   --   GLUCOSE 95 79 102*  --   BUN 17 20 13   --   CREATININE 0.90 0.85 0.83  --   CALCIUM 8.6* 9.0 9.1  --   MG  --   --   --  1.8  PHOS  --   --   --  3.9   Recent Labs    03/27/17 0741 04/23/17 0300 10/10/17 0830  AST 16 18 19   ALT 11* 10* 9*  ALKPHOS 64 53 61  BILITOT 0.5 0.5 0.6  PROT 6.6 6.5 6.7  ALBUMIN 3.0* 3.1* 3.7   Recent Labs    07/09/17 0430 09/11/17 0728 11/13/17 1212  WBC 4.9 5.3 7.2  NEUTROABS 1.7 2.9 3.6  HGB 12.0 11.7* 12.1  HCT 36.1 37.0 38.6  MCV 94.0 95.6 92.8  PLT 209 256 263   Lab Results  Component Value Date   TSH 1.030 09/11/2017     Lab Results  Component Value Date   HGBA1C 5.5 07/09/2017   No results found for: CHOL, HDL, LDLCALC, LDLDIRECT, TRIG, CHOLHDL  Significant Diagnostic Results in last 30 days:  No results found.  Assessment/Plan Essential hypertension Patient was taken off her antihypertensives as her blood pressure was running low . blood pressure has been stable.  Age-related osteoporosis Patient on Prolia and tolerating it well  Esophageal dysmotility She is stable on Prilosec and Zofran before meals GERD Will try to taper her Prilosec and make it QAm from BID.  Depression Patient  stays on Remeron and Celexa  Dementia Stable on Namenda       Family/ staff Communication:   Labs/tests ordered:    Total time spent in this patient care encounter was 25_ minutes; greater than 50% of the visit spent counseling patient, reviewing records , Labs and coordinating care for problems addressed at this encounter.

## 2018-02-28 ENCOUNTER — Encounter: Payer: Self-pay | Admitting: Internal Medicine

## 2018-02-28 ENCOUNTER — Non-Acute Institutional Stay (SKILLED_NURSING_FACILITY): Payer: Medicare Other | Admitting: Internal Medicine

## 2018-02-28 DIAGNOSIS — F0390 Unspecified dementia without behavioral disturbance: Secondary | ICD-10-CM

## 2018-02-28 DIAGNOSIS — F331 Major depressive disorder, recurrent, moderate: Secondary | ICD-10-CM

## 2018-02-28 DIAGNOSIS — S42294D Other nondisplaced fracture of upper end of right humerus, subsequent encounter for fracture with routine healing: Secondary | ICD-10-CM | POA: Diagnosis not present

## 2018-02-28 DIAGNOSIS — R112 Nausea with vomiting, unspecified: Secondary | ICD-10-CM | POA: Diagnosis not present

## 2018-02-28 DIAGNOSIS — I10 Essential (primary) hypertension: Secondary | ICD-10-CM

## 2018-02-28 NOTE — Progress Notes (Signed)
Location:   [facility] Room Number: 115/W Place of Service:  SNF [phone]) Provider:  Sabino Dick, MD  Patient Care Team: [name], MD as PCP - General (Internal Medicine) Synetta Shadow as Physician Assistant (Internal Medicine)  Extended Emergency Contact Information Primary Emergency Contact: [name] [place] Home Phone: [phone] Relation: Son Secondary Emergency Contact: [name] [place] Home Phone: [phone] Mobile Phone: [phone] Relation: Son  Code Status:  Full Code Goals of care: Advanced Directive information Advanced Directives 02/28/2018  Does Patient Have a Medical Advance Directive? Yes  Type of Advance Directive (No Data)  Does patient want to make changes to medical advance directive? No - Patient declined  Copy of Healthcare Power of Attorney in Chart? No - copy requested  Pre-existing out of facility DNR order (yellow form or pink MOST form) -     Chief Complaint  Patient presents with  . Medical Management of Chronic Issues    Patient beng seen for Routine Visit  For medical management of chronic medical conditions including hypertension- dementia-GERD- osteoporosis- history of recurrent vomiting-  HPI:  Pt is a 84 y.o. female seen today for medical management of chronic diseases as noted above.  She appears to be enjoying a period of stability nursing does not report any recent acute issues.  At one time she had recurring vomiting which was quite an issue and appears she lost some weight but her weight has stabilized she is on Remeron for appetite stimulation she appears to be doing well she has been started on Zofran before meals which apparently has helped considerably she is also on a proton pump inhibitor.  She also has a history of hypertension but not on any meds because she is has hypotension with previous meds.  Blood pressure listed today  122/70 I actually got 142/70 later today-I see previous readings 151/85 107/67 and 143/68 there does not appear to be consistent elevations here and that this point will monitor especially with her history of hypotension.  Regards to dementia she appears to be doing well with supportive care she is on Namenda.  She also has a history of osteoporosis with a history of a  proximal humerus fracture in the past she is on Prolia as well as calcium supplementation.  Currently she is sitting in her wheelchair comfortably is about to eat dinner-and indicates she is looking forward to it.     Past Medical History:  Diagnosis Date  . Asthma   . Dementia   . Diabetes mellitus   . H. pylori infection MAR 2014  . Hypertension   . Stroke [facility])    Past Surgical History:  Procedure Laterality Date  . CHOLECYSTECTOMY    . COLECTOMY     secondary to diverticulitis  . ESOPHAGOGASTRODUODENOSCOPY (EGD) WITH ESOPHAGEAL DILATION N/A 01/07/2013   [id number] ring was found at the gastroesophageal junction/SINGLE DUODENAL DIVERTICULUM    No Known Allergies  Outpatient Encounter Medications as of 02/28/2018  Medication Sig  . acetaminophen (TYLENOL) 325 MG tablet Take 650 mg by mouth every 6 (six) hours as needed.  . Amino Acids-Protein Hydrolys (FEEDING SUPPLEMENT, PRO-STAT SUGAR FREE 64,) LIQD Take 30 mLs by mouth 2 (two) times daily between meals.  Marland Kitchen aspirin EC 81 MG tablet Take 81 mg by mouth daily.  [name] [place]-Castor Oil (VENELEX) OINT Apply to sacrum and bilateral buttocks q shift & prn every shift  .  Calcium Carbonate-Vitamin D (CALCIUM 500/VITAMIN D PO) Take 500 mg by mouth 2 (two) times daily.  . citalopram (CELEXA) 20 MG tablet Take 20 mg by mouth daily.   Marland Kitchen denosumab (PROLIA) 60 MG/ML SOLN injection Inject 60 mg into the skin every 6 (six) months. Administer in upper arm, thigh, or abdomen  . loratadine (CLARITIN) 10 MG tablet Take 10 mg by mouth daily.  . memantine (NAMENDA) 10 MG tablet  Take 10 mg by mouth 2 (two) times daily.  . Menthol, Topical Analgesic, (BIOFREEZE) 4 % GEL Apply prn to knees for pain management as needed daily  . mirtazapine (REMERON) 15 MG tablet Take 15 mg by mouth at bedtime.  Marland Kitchen omeprazole (PRILOSEC) 40 MG capsule Take 40 mg by mouth 2 (two) times daily.  . ondansetron (ZOFRAN) 4 MG tablet Give 4 mg by mouth four times a day   No facility-administered encounter medications on file as of 02/28/2018.      Review of Systems   This is essentially unobtainable secondary to dementia--  Immunization History  Administered Date(s) Administered  . Influenza-Unspecified 07/20/2016, 07/20/2017  . Pneumococcal Conjugate-13 06/12/2017  . Pneumococcal-Unspecified 07/24/2016  . Tdap 07/12/2017   Pertinent  Health Maintenance Due  Topic Date Due  . HEMOGLOBIN A1C  03/09/2018 (Originally 01/06/2018)  . URINE MICROALBUMIN  03/09/2018 (Originally 06/03/1942)  . OPHTHALMOLOGY EXAM  04/05/2018  . INFLUENZA VACCINE  05/16/2018  . FOOT EXAM  01/24/2019  . DEXA SCAN  Completed  . PNA vac Low Risk Adult  Completed   Fall Risk  07/12/2017  Falls in the past year? No   Functional Status Survey:    Vitals:   02/28/18 1309  BP: 122/70  Pulse: 73  Resp: 18  Temp: 97.9 F (36.6 C)  TempSrc: Oral  SpO2: 96%  Weight: 125 lb 9.6 oz (57 kg)  Height:  (1.651 m)   Body mass index is 20.9 kg/m. Physical Exam   In general this is a pleasant fairly well-nourished elderly female in no distress Her skin is warm and dry.   Oropharynx is clear mucous membranes moist.  Eyes visual acuity appears intact sclera and conjunctive are clear.  Heart is regular rate and rhythm without murmur gallop or rub she has trace lower extremity edema this is very minimal.  Abdomen is soft nontender with positive bowel sounds.  Musculoskeletal moves all extremities x4 it appears at baseline has some chronic lower extremity weakness but ambulates in a wheelchair.  She has  limited range of motion of her left arm because of previous fracture.  Neurologic is grossly intact her speech is clear no lateralizing findings.  Psych she is oriented to self continues to be pleasant smiling can carry on a very short straightforward conversation.    Labs reviewed: Recent Labs    09/11/17 0728 10/10/17 0830 11/13/17 1212 11/16/17 0700  NA 139 139 139  --   K 3.8 3.8 4.1  --   CL 104 103 102  --   CO2 --   GLUCOSE 95 79 102*  --   BUN --   CREATININE 0.90 0.85 0.83  --   CALCIUM 8.6* 9.0 9.1  --   MG  --   --   --  1.8  PHOS  --   --   --  3.9   Recent Labs    03/27/17 0741 04/23/17 0300 10/10/17 0830  AST ALT  11* 10* 9*  ALKPHOS 64 53 61  BILITOT 0.5 0.5 0.6  PROT 6.6 6.5 6.7  ALBUMIN 3.0* 3.1* 3.7   Recent Labs    07/09/17 0430 09/11/17 0728 11/13/17 1212  WBC 4.9 5.3 7.2  NEUTROABS 1.7 2.9 3.6  HGB 12.0 11.7* 12.1  HCT 36.1 37.0 38.6  MCV 94.0 95.6 92.8  PLT 209 256 263   Lab Results  Component Value Date   TSH 1.030 09/11/2017   Lab Results  Component Value Date   HGBA1C 5.5 07/09/2017   No results found for: CHOL, HDL, LDLCALC, LDLDIRECT, TRIG, CHOLHDL  Significant Diagnostic Results in last 30 days:  No results found.  Assessment/Plan  #1 hypertension as noted above she does have some variable systolics but would be hesitant to start her on medication because of hypotension in the past I do not see consistent elevations at this point will monitor.  2.  History of recurrent vomiting this has improved significantly with the addition of Zofran before meals she is also on Prilosec at this point will monitor her weight appears stabilized at around 125 pounds.  I note she is on pro-stat supplementation--as well as Remeron for appetite stimulation   3.  History of dementia she appears to be doing well with supportive care she is on Namenda.--She is also on Remeron for appetite stimulation and weight  appears to be stabilized  4.  History of osteoporosis she is on Prolia as well as calcium does have a previous history of  proximal humerus fracture-- appears to be doing well again with supportive care.  5.  History of GERD again she is on the Prilosec.  : #6 history of depression this appears stable she is on Celexa.  7.  History of allergic rhinitis she is on Claritin this apparently has stabilized allergy symptoms.   --- [id number].  Of note will update a CBC and metabolic panel for updated values with a history of anemia with a hemoglobin of 12.1 as well as a history of hypertension

## 2018-03-01 ENCOUNTER — Encounter (HOSPITAL_COMMUNITY)
Admission: RE | Admit: 2018-03-01 | Discharge: 2018-03-01 | Disposition: A | Discharge status: Home / Self Care | Payer: Medicare Other | Source: Skilled Nursing Facility | Attending: Internal Medicine | Admitting: Internal Medicine

## 2018-03-01 DIAGNOSIS — R279 Unspecified lack of coordination: Secondary | ICD-10-CM | POA: Insufficient documentation

## 2018-03-01 DIAGNOSIS — R41841 Cognitive communication deficit: Secondary | ICD-10-CM | POA: Diagnosis present

## 2018-03-01 DIAGNOSIS — F0390 Unspecified dementia without behavioral disturbance: Secondary | ICD-10-CM | POA: Insufficient documentation

## 2018-03-01 DIAGNOSIS — Z5189 Encounter for other specified aftercare: Secondary | ICD-10-CM | POA: Insufficient documentation

## 2018-03-01 DIAGNOSIS — I10 Essential (primary) hypertension: Secondary | ICD-10-CM | POA: Insufficient documentation

## 2018-03-01 DIAGNOSIS — M6281 Muscle weakness (generalized): Secondary | ICD-10-CM | POA: Diagnosis present

## 2018-03-01 LAB — CBC WITH DIFFERENTIAL/PLATELET
BASOS PCT: 1 %
Basophils Absolute: 0 10*3/uL (ref 0.0–0.1)
Eosinophils Absolute: 0.2 10*3/uL (ref 0.0–0.7)
Eosinophils Relative: 3 %
HCT: 35.5 % — ABNORMAL LOW (ref 36.0–46.0)
Hemoglobin: 10.9 g/dL — ABNORMAL LOW (ref 12.0–15.0)
Lymphocytes Relative: 29 %
Lymphs Abs: 1.9 10*3/uL (ref 0.7–4.0)
MCH: 26.6 pg (ref 26.0–34.0)
MCHC: 30.7 g/dL (ref 30.0–36.0)
MCV: 86.6 fL (ref 78.0–100.0)
MONO ABS: 0.6 10*3/uL (ref 0.1–1.0)
MONOS PCT: 9 %
NEUTROS ABS: 3.7 10*3/uL (ref 1.7–7.7)
Neutrophils Relative %: 58 %
PLATELETS: 290 10*3/uL (ref 150–400)
RBC: 4.1 MIL/uL (ref 3.87–5.11)
RDW: 15 % (ref 11.5–15.5)
WBC: 6.3 10*3/uL (ref 4.0–10.5)

## 2018-03-01 LAB — BASIC METABOLIC PANEL
Anion gap: 6 (ref 5–15)
BUN: 13 mg/dL (ref 6–20)
CALCIUM: 9.1 mg/dL (ref 8.9–10.3)
CO2: 31 mmol/L (ref 22–32)
CREATININE: 0.82 mg/dL (ref 0.44–1.00)
Chloride: 102 mmol/L (ref 101–111)
GFR calc Af Amer: >60 mL/min (ref >60)
GLUCOSE: 91 mg/dL (ref 65–99)
POTASSIUM: 3.7 mmol/L (ref 3.5–5.1)
SODIUM: 139 mmol/L (ref 135–145)

## 2018-03-14 ENCOUNTER — Encounter (HOSPITAL_COMMUNITY)
Admission: RE | Admit: 2018-03-14 | Discharge: 2018-03-14 | Disposition: A | Discharge status: Home / Self Care | Payer: Medicare Other | Source: Skilled Nursing Facility | Attending: Internal Medicine | Admitting: Internal Medicine

## 2018-03-14 DIAGNOSIS — R41841 Cognitive communication deficit: Secondary | ICD-10-CM | POA: Insufficient documentation

## 2018-03-14 DIAGNOSIS — M6281 Muscle weakness (generalized): Secondary | ICD-10-CM | POA: Insufficient documentation

## 2018-03-14 DIAGNOSIS — J9611 Chronic respiratory failure with hypoxia: Secondary | ICD-10-CM | POA: Diagnosis present

## 2018-03-14 DIAGNOSIS — Z5189 Encounter for other specified aftercare: Secondary | ICD-10-CM | POA: Insufficient documentation

## 2018-03-14 DIAGNOSIS — R279 Unspecified lack of coordination: Secondary | ICD-10-CM | POA: Diagnosis present

## 2018-03-14 DIAGNOSIS — F0390 Unspecified dementia without behavioral disturbance: Secondary | ICD-10-CM | POA: Insufficient documentation

## 2018-03-14 LAB — CBC WITH DIFFERENTIAL/PLATELET
BASOS PCT: 1 %
Basophils Absolute: 0.1 10*3/uL (ref 0.0–0.1)
EOS ABS: 0.2 10*3/uL (ref 0.0–0.7)
EOS PCT: 4 %
HCT: 33.7 % — ABNORMAL LOW (ref 36.0–46.0)
HEMOGLOBIN: 10.3 g/dL — AB (ref 12.0–15.0)
LYMPHS ABS: 2 10*3/uL (ref 0.7–4.0)
Lymphocytes Relative: 41 %
MCH: 26.1 pg (ref 26.0–34.0)
MCHC: 30.6 g/dL (ref 30.0–36.0)
MCV: 85.5 fL (ref 78.0–100.0)
Monocytes Absolute: 0.6 10*3/uL (ref 0.1–1.0)
Monocytes Relative: 12 %
NEUTROS PCT: 42 %
Neutro Abs: 2.1 10*3/uL (ref 1.7–7.7)
PLATELETS: 268 10*3/uL (ref 150–400)
RBC: 3.94 MIL/uL (ref 3.87–5.11)
RDW: 14.9 % (ref 11.5–15.5)
WBC: 4.9 10*3/uL (ref 4.0–10.5)

## 2018-03-25 IMAGING — DX DG CHEST 2V
2 series · 2 of 2 positions shown · non-contrast
Comparison: Chest radiograph 01/06/2017

CLINICAL DATA: Vomiting and coughing

EXAM:
CHEST  2 VIEW

[chest pa]
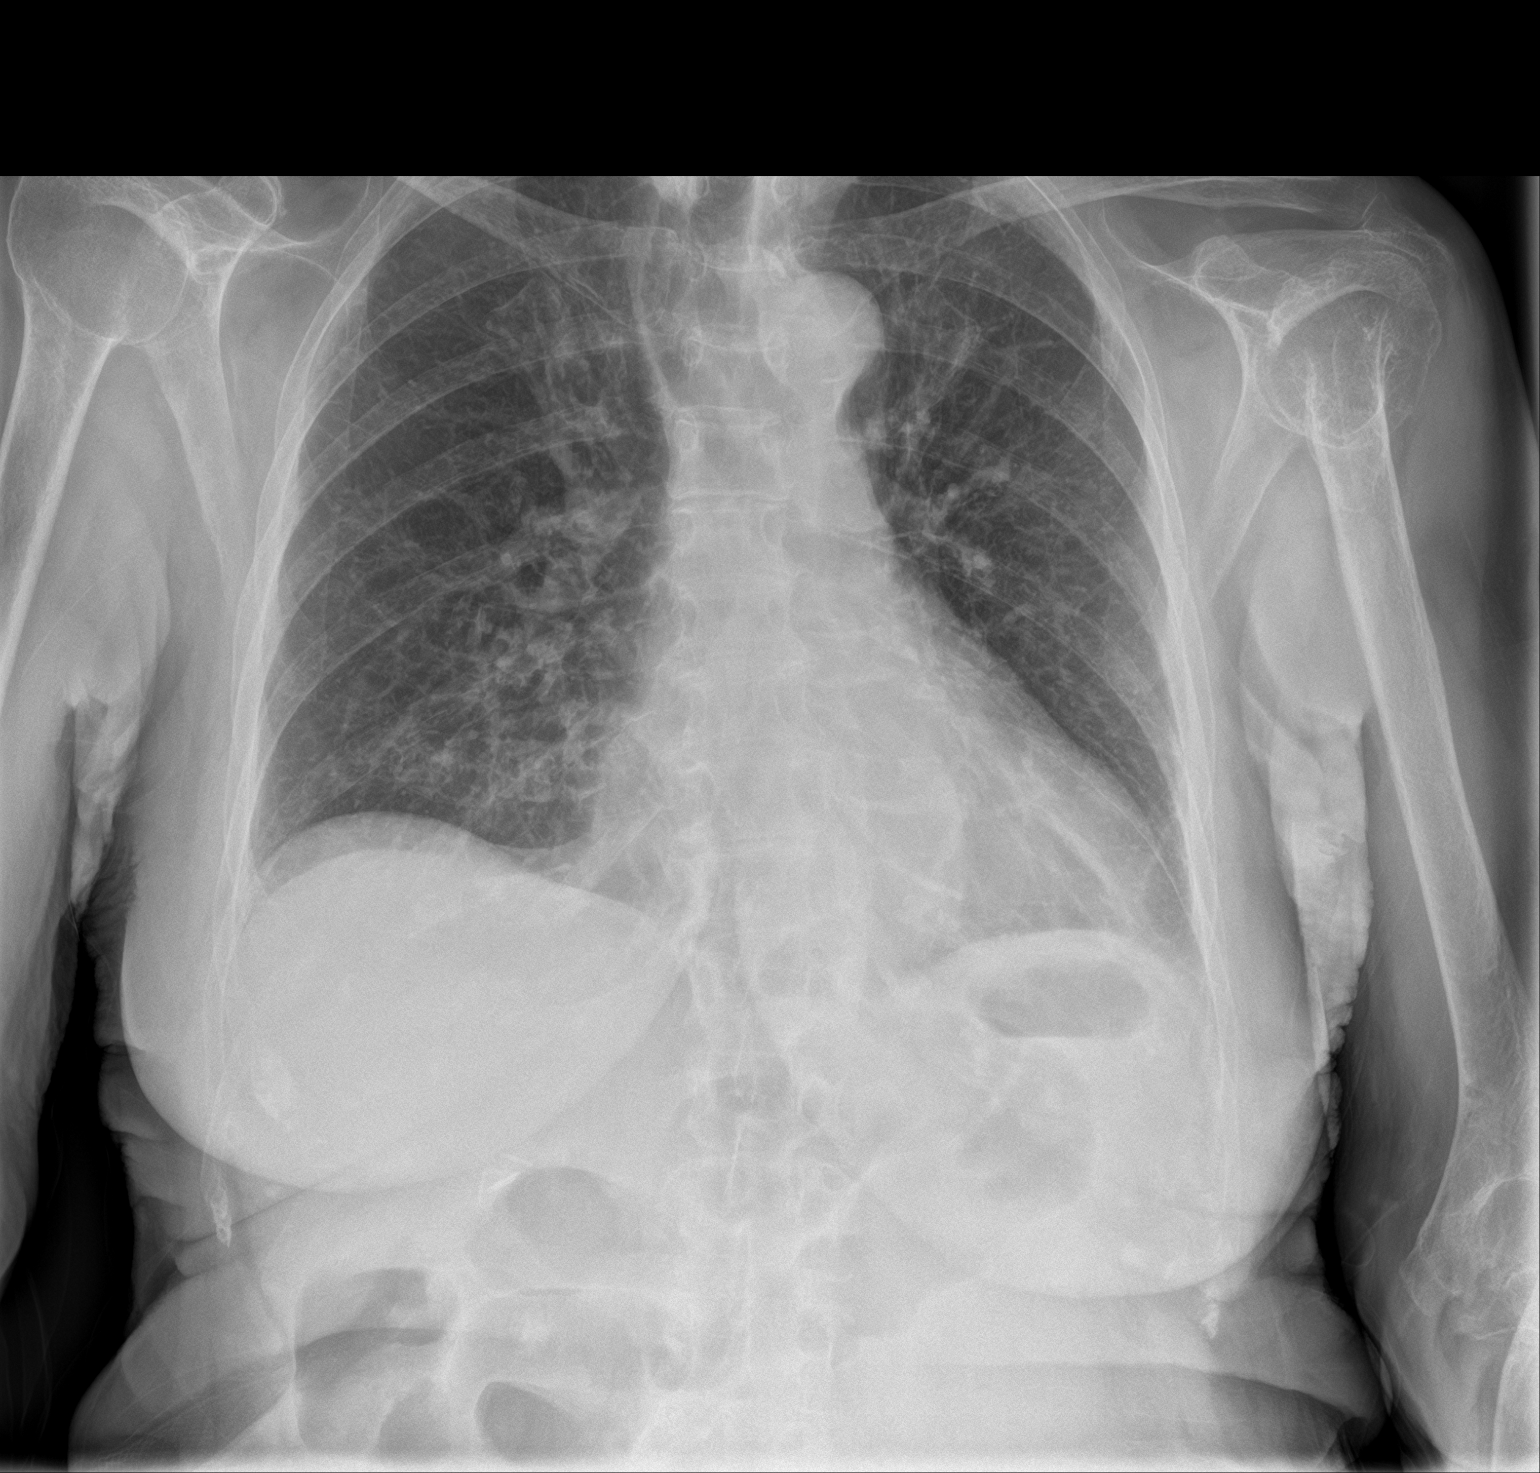

[chest lat]
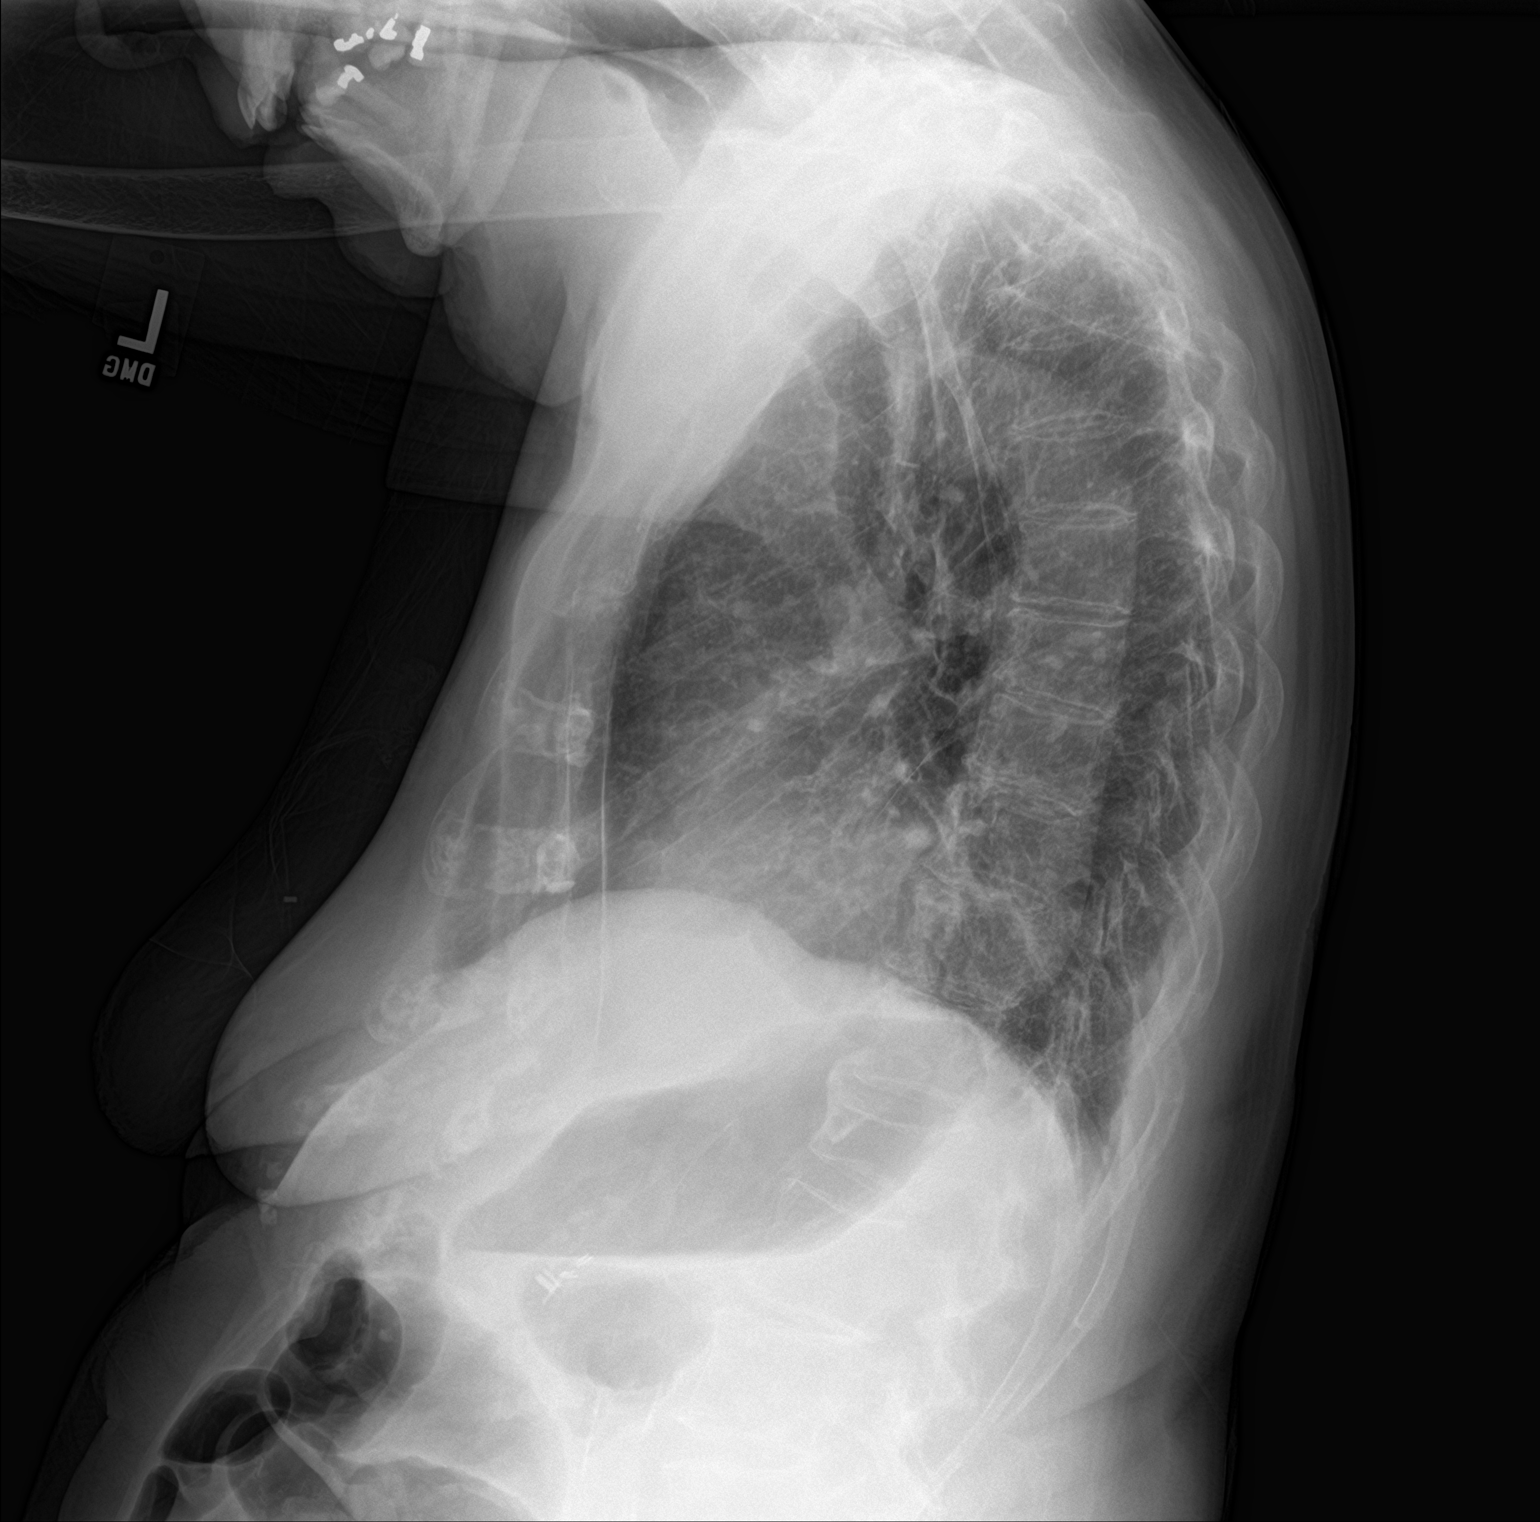

[2 of 2 positions shown; findings below may reference images not displayed]

FINDINGS: Unchanged mild cardiomegaly with chronic interstitial opacities. No
pleural effusion or pneumothorax. No focal consolidation.
IMPRESSION: COPD without acute cardiopulmonary disease.

## 2018-04-17 ENCOUNTER — Other Ambulatory Visit (HOSPITAL_COMMUNITY)
Admission: AD | Admit: 2018-04-17 | Discharge: 2018-04-17 | Disposition: A | Discharge status: Home / Self Care | Payer: Medicare Other | Source: Skilled Nursing Facility | Attending: Internal Medicine | Admitting: Internal Medicine

## 2018-04-17 ENCOUNTER — Encounter: Payer: Self-pay | Admitting: Internal Medicine

## 2018-04-17 ENCOUNTER — Non-Acute Institutional Stay (SKILLED_NURSING_FACILITY): Payer: Medicare Other | Admitting: Internal Medicine

## 2018-04-17 DIAGNOSIS — R1110 Vomiting, unspecified: Secondary | ICD-10-CM | POA: Diagnosis not present

## 2018-04-17 DIAGNOSIS — R05 Cough: Secondary | ICD-10-CM | POA: Diagnosis not present

## 2018-04-17 DIAGNOSIS — Z8701 Personal history of pneumonia (recurrent): Secondary | ICD-10-CM | POA: Diagnosis not present

## 2018-04-17 DIAGNOSIS — I10 Essential (primary) hypertension: Secondary | ICD-10-CM | POA: Insufficient documentation

## 2018-04-17 DIAGNOSIS — R059 Cough, unspecified: Secondary | ICD-10-CM | POA: Insufficient documentation

## 2018-04-17 LAB — BASIC METABOLIC PANEL
ANION GAP: 8 (ref 5–15)
BUN: 17 mg/dL (ref 8–23)
CALCIUM: 8.9 mg/dL (ref 8.9–10.3)
CHLORIDE: 104 mmol/L (ref 98–111)
CO2: 28 mmol/L (ref 22–32)
Creatinine, Ser: 0.85 mg/dL (ref 0.44–1.00)
GFR calc non Af Amer: >60 mL/min (ref >60)
Glucose, Bld: 163 mg/dL — ABNORMAL HIGH (ref 70–99)
POTASSIUM: 3.8 mmol/L (ref 3.5–5.1)
Sodium: 140 mmol/L (ref 135–145)

## 2018-04-17 LAB — CBC WITH DIFFERENTIAL/PLATELET
BASOS ABS: 0 10*3/uL (ref 0.0–0.1)
BASOS PCT: 0 %
Eosinophils Absolute: 0.1 10*3/uL (ref 0.0–0.7)
Eosinophils Relative: 1 %
HEMATOCRIT: 34.7 % — AB (ref 36.0–46.0)
HEMOGLOBIN: 10.7 g/dL — AB (ref 12.0–15.0)
Lymphocytes Relative: 10 %
Lymphs Abs: 1 10*3/uL (ref 0.7–4.0)
MCH: 26 pg (ref 26.0–34.0)
MCHC: 30.8 g/dL (ref 30.0–36.0)
MCV: 84.4 fL (ref 78.0–100.0)
Monocytes Absolute: 0.7 10*3/uL (ref 0.1–1.0)
Monocytes Relative: 7 %
NEUTROS ABS: 8.5 10*3/uL — AB (ref 1.7–7.7)
NEUTROS PCT: 82 %
Platelets: 283 10*3/uL (ref 150–400)
RBC: 4.11 MIL/uL (ref 3.87–5.11)
RDW: 15.6 % — AB (ref 11.5–15.5)
WBC: 10.4 10*3/uL (ref 4.0–10.5)

## 2018-04-17 NOTE — Progress Notes (Signed)
Location:   [facility] Room Number: 115/W Place of Service:  SNF [phone]) Provider:  Sabino Dick, MD  Patient Care Team: [name], MD as PCP - General (Internal Medicine) Synetta Shadow as Physician Assistant (Internal Medicine)  Extended Emergency Contact Information Primary Emergency Contact: [name] [place] Home Phone: [phone] Relation: Son Secondary Emergency Contact: [name] [place] Home Phone: [phone] Mobile Phone: [phone] Relation: Son  Code Status:  Full Code Goals of care: Advanced Directive information Advanced Directives 04/17/2018  Does Patient Have a Medical Advance Directive? Yes  Type of Advance Directive (No Data)  Does patient want to make changes to medical advance directive? No - Patient declined  Copy of Healthcare Power of Attorney in Chart? No - copy requested  Pre-existing out of facility DNR order (yellow form or pink MOST form) -     Chief Complaint  Patient presents with  . Acute Visit    Patient is being seen for Cough and Vomiting    HPI:  Pt is a 84 y.o. female seen today for an acute visit for  coughing-and apparently at one time vomited up some undigested food while coughing.  Patient is a long-term resident of facility with a history of hypertension as well as dementia GERD osteoporosis and a history of recurrent vomiting  At one point vomiting was a significant issue but she has done very well on routine Zofran before meals.  Per nursing it appears the vomiting episode this afternoon was most likely caused by coughing  She is not complaining of any distress but says she just does not feel very good she is a poor historian secondary to dementia but is not really complaining of shortness of breath vital signs appear to be stable  Systolic was somewhat elevated at 160 after coughing but later came down to 140.  She is complaining  of a cough does not appear to be very productive-     Past Medical History:  Diagnosis Date  . Asthma   . Dementia   . Diabetes mellitus   . H. pylori infection MAR 2014  . Hypertension   . Stroke [facility])    Past Surgical History:  Procedure Laterality Date  . CHOLECYSTECTOMY    . COLECTOMY     secondary to diverticulitis  . ESOPHAGOGASTRODUODENOSCOPY (EGD) WITH ESOPHAGEAL DILATION N/A 01/07/2013   [id number] ring was found at the gastroesophageal junction/SINGLE DUODENAL DIVERTICULUM    No Known Allergies  Outpatient Encounter Medications as of 04/17/2018  Medication Sig  . acetaminophen (TYLENOL) 325 MG tablet Take 650 mg by mouth every 6 (six) hours as needed.  . Amino Acids-Protein Hydrolys (FEEDING SUPPLEMENT, PRO-STAT SUGAR FREE 64,) LIQD Take 30 mLs by mouth 2 (two) times daily between meals.  Marland Kitchen aspirin EC 81 MG tablet Take 81 mg by mouth daily.  [name] [place]-Castor Oil (VENELEX) OINT Apply to sacrum and bilateral buttocks q shift & prn every shift  . Calcium Carbonate-Vitamin D (CALCIUM 500/VITAMIN D PO) Take 500 mg by mouth 2 (two) times daily.  . citalopram (CELEXA) 20 MG tablet Take 20 mg by mouth daily.   Marland Kitchen denosumab (PROLIA) 60 MG/ML SOLN injection Inject 60 mg into the skin every 6 (six) months. Administer in upper arm, thigh, or abdomen  . Dextromethorphan-guaiFENesin (ROBITUSSIN COUGH+CHEST CONG DM) 5-100 MG/5ML LIQD Give 5 cc by mouth twice a day x 3 days  . loratadine (CLARITIN)  10 MG tablet Take 10 mg by mouth daily.  . memantine (NAMENDA) 10 MG tablet Take 10 mg by mouth 2 (two) times daily.  . Menthol, Topical Analgesic, (BIOFREEZE) 4 % GEL Apply prn to knees for pain management as needed daily  . mirtazapine (REMERON) 15 MG tablet Take 15 mg by mouth at bedtime.  Marland Kitchen omeprazole (PRILOSEC) 40 MG capsule Take 40 mg by mouth 2 (two) times daily.  . ondansetron (ZOFRAN) 4 MG tablet Give 4 mg by mouth four times a day   No facility-administered encounter  medications on file as of 04/17/2018.     Review of Systems   This is limited secondary to dementia-provided by nursing as well  In general is not complaining of fever chills.  Skin does not complain of diaphoresis rashes or itching.  Head ears eyes nose mouth and throat not complaining of a sore throat or visual changes.  Respiratory does have a cough but does not really complain of shortness of breath.  Cardiac does not complain of chest pain  GI is not really complaining abdominal discomfort. Nausea constipation or diarrhea did have an episode of vomiting  GU does not complain of dysuria.  Skeletal is not complaining of joint pain currently.  Neurologic does not complain of feeling dizzy or having a headache or syncope  Psych does not complain of being depressed or anxious not give that presentation  Immunization History  Administered Date(s) Administered  . Influenza-Unspecified 07/20/2016, 07/20/2017  . Pneumococcal Conjugate-13 06/12/2017  . Pneumococcal-Unspecified 07/24/2016  . Tdap 07/12/2017   Pertinent  Health Maintenance Due  Topic Date Due  . HEMOGLOBIN A1C  05/18/2018 (Originally 01/06/2018)  . OPHTHALMOLOGY EXAM  05/18/2018 (Originally 04/05/2018)  . URINE MICROALBUMIN  05/18/2018 (Originally 06/03/1942)  . INFLUENZA VACCINE  05/16/2018  . FOOT EXAM  01/24/2019  . DEXA SCAN  Completed  . PNA vac Low Risk Adult  Completed   Fall Risk  07/12/2017  Falls in the past year? No   Functional Status Survey:     Temperature is 98.7 pulse 96 respirations 20 pressure 140/85 saturation is in the 90s on room air Physical Exam  In general this is a pleasant elderly female in no distress sitting in her wheelchair.  Her skin is warm and dry.  Eyes appears to have some watery discharge status post coughing appears to be more the result of coughing.  Oropharynx is clear mucous membranes moist.  Chest has some slight scattered congestion there is no labored  breathing.  Heart is regular rate and rhythm with some irregular beats-she has mild baseline lower extremity edema.  Abdomen is soft nontender with positive bowel sounds.  Musculoskeletal moves her extremities at baseline with limited range of motion of her left arm--has a history of previous fracture there   Neurologic appears basically intact-her speech is clear --no lateralizing findings cranial nerves intact  Psych she is oriented to self pleasant appropriate follows simple verbal commands without difficulty  Labs reviewed: Recent Labs    10/10/17 0830 11/13/17 1212 11/16/17 0700 03/01/18 0711  NA 139 139  --  139  K 3.8 4.1  --  3.7  CL 103 102  --  102  CO2 23 28  --  31  GLUCOSE 79 102*  --  91  BUN 20 13  --  13  CREATININE 0.85 0.83  --  0.82  CALCIUM 9.0 9.1  --  9.1  MG  --   --  1.8  --  PHOS  --   --  3.9  --    Recent Labs    04/23/17 0300 10/10/17 0830  AST 18 19  ALT 10* 9*  ALKPHOS 53 61  BILITOT 0.5 0.6  PROT 6.5 6.7  ALBUMIN 3.1* 3.7   Recent Labs    11/13/17 1212 03/01/18 0711 03/14/18 0714  WBC 7.2 6.3 4.9  NEUTROABS 3.6 3.7 2.1  HGB 12.1 10.9* 10.3*  HCT 38.6 35.5* 33.7*  MCV 92.8 86.6 85.5  PLT 263 290 268   Lab Results  Component Value Date   TSH 1.030 09/11/2017   Lab Results  Component Value Date   HGBA1C 5.5 07/09/2017   No results found for: CHOL, HDL, LDLCALC, LDLDIRECT, TRIG, CHOLHDL  Significant Diagnostic Results in last 30 days:  No results found.  Assessment/Plan  #1 cough with some chest congestion- she did not appear to be in any distress but does have a significant cough here when I rechecked her later in the day she appeared to be more comfortable- however she does have a history of  significant pneumonia in the past and will be aggressive here.  And  start Augmentin 500 mg twice daily for 7 days along with a probiotic twice daily for 10 days  Will order a chest x-ray two-view.  Monitor vital signs pulse  ox every 4 hours x 3 and then every shift.  Also will prescribe Robitussin 3 times a day x3 days and then decrease to twice daily for 2 days and monitor.  Also will start Atrovent nebulizers as needed as needed every 6 hours for 5 days.  Also will update CBC with differential and BMP today to look for any elevated white count or abnormalities  Addendum we do have the updated lab work which is fairly reassuring white count is within normal range at 10.4 neutrophils are mildly elevated 8.5 absolute --.  Hemoglobin is stable at 10.7.  Sodium is 140 potassium 3.8 BUN 17 creatinine 0.85 CO2 level is 28.  Again will continue with above plan and monitor again on reevaluation she appeared to be more comfortable with no further vomiting-again she has done well with the routine Zofran in that regard   [id number]-of note greater than 35 minutes spent assessing patient-reassessing patient- discussing her status with nursing- reviewing her chart and labs- and coordinating and formulating a plan of care for numerous diagnoses- of note greater than 50% of time spent coordinating a plan of care.

## 2018-04-22 ENCOUNTER — Encounter: Payer: Self-pay | Admitting: Internal Medicine

## 2018-04-22 ENCOUNTER — Non-Acute Institutional Stay (SKILLED_NURSING_FACILITY): Payer: Medicare Other | Admitting: Internal Medicine

## 2018-04-22 DIAGNOSIS — R05 Cough: Secondary | ICD-10-CM | POA: Diagnosis not present

## 2018-04-22 DIAGNOSIS — R059 Cough, unspecified: Secondary | ICD-10-CM

## 2018-04-22 NOTE — Progress Notes (Signed)
Location:   [facility] Room Number: 115/W Place of Service:  SNF [phone]) Provider:  Sabino Dick, MD  Patient Care Team: [name], MD as PCP - General (Internal Medicine) Synetta Shadow as Physician Assistant (Internal Medicine)  Extended Emergency Contact Information Primary Emergency Contact: [name] [place] Home Phone: [phone] Relation: Son Secondary Emergency Contact: [name] [place] Home Phone: [phone] Mobile Phone: [phone] Relation: Son  Code Status:  Full Code Goals of care: Advanced Directive information Advanced Directives 04/22/2018  Does Patient Have a Medical Advance Directive? Yes  Type of Advance Directive (No Data)  Does patient want to make changes to medical advance directive? No - Patient declined  Copy of Healthcare Power of Attorney in Chart? No - copy requested  Pre-existing out of facility DNR order (yellow form or pink MOST form) -     Chief Complaint  Patient presents with  . Acute Visit    F/U Visit for Upper Respiratory Issues    HPI:  Pt is a 84 y.o. female seen today for an acute visit for follow-up of URI last week.  Patient did have a significant cough with some chest congestion.  And was started on Augmentin which she is completing as of tomorrow agraffe she also was on Robitussin appears to have been completed yesterday.  He was also started Atrovent nebulizers for 5-day course as needed.  Chest x-ray appeared to be negative we are awaiting confirmation  She is doing better now still has a cough somewhat and nursing staff would like to Mucinex extended.  But clinically she appears to be improved she does complain of an occasional cough but is not complaining of any shortness of breath appears to be essentially at her baseline   vital signs are stable O2 saturation is 94% on room air     Past Medical History:  Diagnosis  Date  . Asthma   . Dementia   . Diabetes mellitus   . H. pylori infection MAR 2014  . Hypertension   . Stroke [facility])    Past Surgical History:  Procedure Laterality Date  . CHOLECYSTECTOMY    . COLECTOMY     secondary to diverticulitis  . ESOPHAGOGASTRODUODENOSCOPY (EGD) WITH ESOPHAGEAL DILATION N/A 01/07/2013   [id number] ring was found at the gastroesophageal junction/SINGLE DUODENAL DIVERTICULUM    No Known Allergies  Outpatient Encounter Medications as of 04/22/2018  Medication Sig  . acetaminophen (TYLENOL) 325 MG tablet Take 650 mg by mouth every 6 (six) hours as needed.  . Amino Acids-Protein Hydrolys (FEEDING SUPPLEMENT, PRO-STAT SUGAR FREE 64,) LIQD Take 30 mLs by mouth 2 (two) times daily between meals.  Marland Kitchen amoxicillin-clavulanate (AUGMENTIN) 500-125 MG tablet Take 1 tablet by mouth 3 (three) times daily. Take x 7 days from 7/4/209-04/23/2018  . aspirin EC 81 MG tablet Take 81 mg by mouth daily.  [name] [place]-Castor Oil (VENELEX) OINT Apply to sacrum and bilateral buttocks q shift & prn every shift  . Calcium Carbonate-Vitamin D (CALCIUM 500/VITAMIN D PO) Take 500 mg by mouth 2 (two) times daily.  . citalopram (CELEXA) 20 MG tablet Take 20 mg by mouth daily.   Marland Kitchen denosumab (PROLIA) 60 MG/ML SOLN injection Inject 60 mg into the skin every 6 (six) months. Administer in upper arm, thigh, or abdomen  . guaiFENesin (MUCINEX) 600 MG 12 hr tablet Take 600 mg by mouth 2 (two) times daily.  Marland Kitchen loratadine (  CLARITIN) 10 MG tablet Take 10 mg by mouth daily.  . memantine (NAMENDA) 10 MG tablet Take 10 mg by mouth 2 (two) times daily.  . Menthol, Topical Analgesic, (BIOFREEZE) 4 % GEL Apply prn to knees for pain management as needed daily  . mirtazapine (REMERON) 15 MG tablet Take 15 mg by mouth at bedtime.  Marland Kitchen. omeprazole (PRILOSEC) 40 MG capsule Take 40 mg by mouth daily.   . ondansetron (ZOFRAN) 4 MG tablet Give 4 mg by mouth four times a day  . Probiotic Product ([employer] PROBIOTIC) CHEW Give  1 tablet by mouth twice a day  . [DISCONTINUED] Dextromethorphan-guaiFENesin (ROBITUSSIN COUGH+CHEST CONG DM) 5-100 MG/5ML LIQD Give 5 cc by mouth twice a day x 3 days   No facility-administered encounter medications on file as of 04/22/2018.     Review of Systems   Is limited secondary to dementia.  General she not complain of any fever or chills.  Skin is not complaining of any diaphoresis.  Head ears eyes nose mouth and throat is not complaining of sore throat or visual changes.  Respiratory denies shortness of breath says she still has a cough at times --nursing staff concurs with this.  Cardiac is not complaining of chest pain.  GI is not complaining of abdominal discomfort does not complain of nausea has a history of vomiting but this has improved significantly on Zofran- had a vomiting episode last week but I have not heard any further reports  Musculoskeletal is not complaining of any joint pain  Neurologic is not complaining of any dizziness or headache  Immunization History  Administered Date(s) Administered  . Influenza-Unspecified 07/20/2016, 07/20/2017  . Pneumococcal Conjugate-13 06/12/2017  . Pneumococcal-Unspecified 07/24/2016  . Tdap 07/12/2017   Pertinent  Health Maintenance Due  Topic Date Due  . HEMOGLOBIN A1C  05/18/2018 (Originally 01/06/2018)  . OPHTHALMOLOGY EXAM  05/18/2018 (Originally 04/05/2018)  . URINE MICROALBUMIN  05/18/2018 (Originally 06/03/1942)  . INFLUENZA VACCINE  05/16/2018  . FOOT EXAM  01/24/2019  . DEXA SCAN  Completed  . PNA vac Low Risk Adult  Completed   Fall Risk  07/12/2017  Falls in the past year? No   Functional Status Survey:    Vitals:   04/22/18 1607  BP: 128/83  Pulse: 82  Resp: 18  Temp: (!) 97.1 F (36.2 C)  TempSrc: Oral  SpO2: 94%    Physical Exam   In general this is a pleasant elderly female in no distress sitting comfortably in her wheelchair.  Her skin is warm and dry.  Eyes sclera and conjunctive  are clear there is no drainage visual acuity appears to be intact   Oropharynx is clear mucous membranes moist  Chest is clear to auscultation there is no labored breathing  Heart is largely regular rate and rhythm without murmur gallop or rub--continues to have mild lower extremity edema  Musculoskeletal moves her extremities at baseline--currently ambulating in wheelchair  Psych she is oriented to self pleasant appropriate her usual self    Labs reviewed: Recent Labs    11/13/17 1212 11/16/17 0700 03/01/18 0711 04/17/18 1745  NA 139  --  139 140  K 4.1  --  3.7 3.8  CL 102  --  102 104  CO2 28  --  31 28  GLUCOSE 102*  --  91 163*  BUN 13  --  13 17  CREATININE 0.83  --  0.82 0.85  CALCIUM 9.1  --  9.1 8.9  MG  --  1.8  --   --   PHOS  --  3.9  --   --    Recent Labs    04/23/17 0300 10/10/17 0830  AST 18 19  ALT 10* 9*  ALKPHOS 53 61  BILITOT 0.5 0.6  PROT 6.5 6.7  ALBUMIN 3.1* 3.7   Recent Labs    03/01/18 0711 03/14/18 0714 04/17/18 1745  WBC 6.3 4.9 10.4  NEUTROABS 3.7 2.1 8.5*  HGB 10.9* 10.3* 10.7*  HCT 35.5* 33.7* 34.7*  MCV 86.6 85.5 84.4  PLT 290 268 283   Lab Results  Component Value Date   TSH 1.030 09/11/2017   Lab Results  Component Value Date   HGBA1C 5.5 07/09/2017   No results found for: CHOL, HDL, LDLCALC, LDLDIRECT, TRIG, CHOLHDL  Significant Diagnostic Results in last 30 days:  No results found.  Assessment/Plan  #1 cough with chest congestion--patient appears significantly improved today- nursing staff says she is still having some cough and feels she would benefit from a course of Mucinex  We will continue 5 days of Mucinex- continue to monitor but this appears largely resolved--and back at her baseline   [id number]

## 2018-04-26 ENCOUNTER — Encounter: Payer: Self-pay | Admitting: Internal Medicine

## 2018-04-26 ENCOUNTER — Non-Acute Institutional Stay (SKILLED_NURSING_FACILITY): Payer: Medicare Other | Admitting: Internal Medicine

## 2018-04-26 DIAGNOSIS — R05 Cough: Secondary | ICD-10-CM

## 2018-04-26 DIAGNOSIS — Z8709 Personal history of other diseases of the respiratory system: Secondary | ICD-10-CM

## 2018-04-26 DIAGNOSIS — R059 Cough, unspecified: Secondary | ICD-10-CM

## 2018-04-26 NOTE — Progress Notes (Signed)
Location:   [facility] Room Number: 115/W Place of Service:  SNF [phone](31) Provider:  Sabino DickArlo Kalia Vahey  Gupta, Anjali L, MD  Patient Care Team: Mahlon GammonGupta, Anjali L, MD as PCP - General (Internal Medicine) Synetta ShadowLassen, Tanaya Dunigan C, PA-C as Physician Assistant (Internal Medicine)  Extended Emergency Contact Information Primary Emergency Contact: [name],[name]  [place] Home Phone: [phone] Relation: Son Secondary Emergency Contact: [name],[name]  [place] Home Phone: [phone] Mobile Phone: [phone] Relation: Son  Code Status:  Full Code Goals of care: Advanced Directive information Advanced Directives 04/26/2018  Does Patient Have a Medical Advance Directive? Yes  Type of Advance Directive (No Data)  Does patient want to make changes to medical advance directive? No - Patient declined  Copy of Healthcare Power of Attorney in Chart? No - copy requested  Pre-existing out of facility DNR order (yellow form or pink MOST form) -     Chief Complaint  Patient presents with  . Acute Visit     Patient is being seen for Respiratory Issues    HPI:  Pt is a 84 y.o. female seen today for an acute visit for continued follow-up of cough. She has been seen a couple times in the last couple weeks for respiratory issues initially had a pretty significant cough although x-ray was negative she did respond to some PRN Atrovent nebs as well as as well as Mucinex which she was switched to from Robitussin secondary to better tolerance -we actually continued to Mucinex when I saw her earlier this week but   still has some cough.  She   also completed as 1 week course of Augmentin   Nursing staff still think she has somewhat increased cough and we have ordered a chest x-ray which again does not show really any acute process  Patient herself is a poor historian secondary to dementia but she does not complain of any shortness of breath and I did not note much  coughing when I assessed her--but nursing staff says she still does have somewhat of a significant cough   vital signs are stable oxygen   saturations are in the 90s on room air    Past Medical History:  Diagnosis Date  . Asthma   . Dementia   . Diabetes mellitus   . H. pylori infection MAR 2014  . Hypertension   . Stroke [facility](HCC)    Past Surgical History:  Procedure Laterality Date  . CHOLECYSTECTOMY    . COLECTOMY     secondary to diverticulitis  . ESOPHAGOGASTRODUODENOSCOPY (EGD) WITH ESOPHAGEAL DILATION N/A 01/07/2013   [id number]:Schatzki ring was found at the gastroesophageal junction/SINGLE DUODENAL DIVERTICULUM    No Known Allergies  Outpatient Encounter Medications as of 04/26/2018  Medication Sig  . acetaminophen (TYLENOL) 325 MG tablet Take 650 mg by mouth every 6 (six) hours as needed.  . Amino Acids-Protein Hydrolys (FEEDING SUPPLEMENT, PRO-STAT SUGAR FREE 64,) LIQD Take 30 mLs by mouth 2 (two) times daily between meals.  Marland Kitchen. aspirin EC 81 MG tablet Take 81 mg by mouth daily.  [name]. Balsam [place]-Castor Oil (VENELEX) OINT Apply to sacrum and bilateral buttocks q shift & prn every shift  . Calcium Carbonate-Vitamin D (CALCIUM 500/VITAMIN D PO) Take 500 mg by mouth 2 (two) times daily.  . citalopram (CELEXA) 20 MG tablet Take 20 mg by mouth daily.   Marland Kitchen. denosumab (PROLIA) 60 MG/ML SOLN injection Inject 60 mg into the skin every 6 (six) months. Administer in upper  arm, thigh, or abdomen  . guaiFENesin (MUCINEX) 600 MG 12 hr tablet Take 600 mg by mouth 2 (two) times daily.  Marland Kitchen loratadine (CLARITIN) 10 MG tablet Take 10 mg by mouth daily.  . memantine (NAMENDA) 10 MG tablet Take 10 mg by mouth 2 (two) times daily.  . Menthol, Topical Analgesic, (BIOFREEZE) 4 % GEL Apply prn to knees for pain management as needed daily  . mirtazapine (REMERON) 15 MG tablet Take 15 mg by mouth at bedtime.  Marland Kitchen omeprazole (PRILOSEC) 40 MG capsule Take 40 mg by mouth daily.   . ondansetron (ZOFRAN) 4 MG tablet  Give 4 mg by mouth four times a day  . Probiotic Product ([employer] PROBIOTIC) CHEW Give 1 tablet by mouth twice a day  . [DISCONTINUED] amoxicillin-clavulanate (AUGMENTIN) 500-125 MG tablet Take 1 tablet by mouth 3 (three) times daily. Take x 7 days from 7/4/209-04/23/2018   No facility-administered encounter medications on file as of 04/26/2018.     Review of Systems  This is limited secondary to dementia provided by nursing as well.  General no complaints of fever chills.  Skin is not complaining of diaphoresis or itching.  Head ears eyes nose mouth and throat is not complaining of any sore throat or visual changes or eye discharge.  Respiratory denies shortness of breath but still has a cough.  Cardiac is not complaining of chest pain does not appear to have much lower extremity edema.  GI is not complaining of any nausea vomiting diarrhea constipation-at one point had significant vomiting issues with this appears largely controlled with the routine Zofran 4 times a day as well as a PPI  Musculoskeletal is not complaining of any joint pain.  Neurologic does not complain of feeling dizzy or having a headache.  Psych does not appear to be anxious or overtly depressed --continues  to be somewhat quiet but very pleasant  Immunization History  Administered Date(s) Administered  . Influenza-Unspecified 07/20/2016, 07/20/2017  . Pneumococcal Conjugate-13 06/12/2017  . Pneumococcal-Unspecified 07/24/2016  . Tdap 07/12/2017   Pertinent  Health Maintenance Due  Topic Date Due  . HEMOGLOBIN A1C  05/18/2018 (Originally 01/06/2018)  . OPHTHALMOLOGY EXAM  05/18/2018 (Originally 04/05/2018)  . URINE MICROALBUMIN  05/18/2018 (Originally 06/03/1942)  . INFLUENZA VACCINE  05/16/2018  . FOOT EXAM  01/24/2019  . DEXA SCAN  Completed  . PNA vac Low Risk Adult  Completed   Fall Risk  07/12/2017  Falls in the past year? No   Functional Status Survey:    Vitals:   04/26/18 1220  BP: 126/65    Pulse: 68  Resp: 18  Temp: 97.9 F (36.6 C)  TempSrc: Oral  O2 sats are in the 90s on room air  Physical Exam   In general this is a pleasant elderly female who appears to be at her baseline sitting comfortably in her wheelchair  Her skin is warm and dry.  Eyes sclera and conjunctive are clear I do not see any discharge visual acuity appears to be grossly intact  Oropharynx is clear mucous membranes moist.  Chest is clear to auscultation with slightly shallow air entry there is no labored breathing.  Is soft nontender with positive bowel sounds.  Musculoskeletal moves her extremities at baseline has baseline lower extremity weakness.  Neurologic is grossly intact her speech is clear.  Psych she is oriented to self pleasant follow simple verbal commands without difficulty  Labs reviewed: Recent Labs    11/13/17 1212 11/16/17 0700 03/01/18 0711 04/17/18  1745  NA 139  --  139 140  K 4.1  --  3.7 3.8  CL 102  --  102 104  CO2 28  --  31 28  GLUCOSE 102*  --  91 163*  BUN 13  --  13 17  CREATININE 0.83  --  0.82 0.85  CALCIUM 9.1  --  9.1 8.9  MG  --  1.8  --   --   PHOS  --  3.9  --   --    Recent Labs    10/10/17 0830  AST 19  ALT 9*  ALKPHOS 61  BILITOT 0.6  PROT 6.7  ALBUMIN 3.7   Recent Labs    03/01/18 0711 03/14/18 0714 04/17/18 1745  WBC 6.3 4.9 10.4  NEUTROABS 3.7 2.1 8.5*  HGB 10.9* 10.3* 10.7*  HCT 35.5* 33.7* 34.7*  MCV 86.6 85.5 84.4  PLT 290 268 283   Lab Results  Component Value Date   TSH 1.030 09/11/2017   Lab Results  Component Value Date   HGBA1C 5.5 07/09/2017   No results found for: CHOL, HDL, LDLCALC, LDLDIRECT, TRIG, CHOLHDL  Significant Diagnostic Results in last 30 days:  No results found.  Assessment/Plan  #1 cough- chest x-ray is reassuring according to nursing she still has a fairly significant cough but has improved- will extend her Mucinex which she has been started on for 7 additional days and also will make  her Atrovent nebulizers routine 6 hours for 48 hours and then as needed-hopefully this will help with the cough.  Monitor vital signs with pulse ox to shift x72 hours   Physical  exam was fairly benign which is reassuring.  Also on recent lab White count was within normal range but neutrophils were mildly elevated at 8.5 she did not show signs of infection but will update a CBC with differential tomorrow for follow-up  [phone]

## 2018-04-27 ENCOUNTER — Encounter (HOSPITAL_COMMUNITY)
Admission: RE | Admit: 2018-04-27 | Discharge: 2018-04-27 | Disposition: A | Discharge status: Home / Self Care | Payer: Medicare Other | Source: Skilled Nursing Facility | Attending: Internal Medicine | Admitting: Internal Medicine

## 2018-04-27 DIAGNOSIS — I10 Essential (primary) hypertension: Secondary | ICD-10-CM | POA: Insufficient documentation

## 2018-04-27 DIAGNOSIS — M6281 Muscle weakness (generalized): Secondary | ICD-10-CM | POA: Insufficient documentation

## 2018-04-27 DIAGNOSIS — R279 Unspecified lack of coordination: Secondary | ICD-10-CM | POA: Diagnosis present

## 2018-04-27 DIAGNOSIS — F0390 Unspecified dementia without behavioral disturbance: Secondary | ICD-10-CM | POA: Diagnosis present

## 2018-04-27 DIAGNOSIS — Z5189 Encounter for other specified aftercare: Secondary | ICD-10-CM | POA: Insufficient documentation

## 2018-04-27 DIAGNOSIS — R41841 Cognitive communication deficit: Secondary | ICD-10-CM | POA: Diagnosis present

## 2018-04-27 LAB — CBC WITH DIFFERENTIAL/PLATELET
BASOS ABS: 0 10*3/uL (ref 0.0–0.1)
Basophils Relative: 1 %
Eosinophils Absolute: 0.2 10*3/uL (ref 0.0–0.7)
Eosinophils Relative: 4 %
HCT: 31.8 % — ABNORMAL LOW (ref 36.0–46.0)
Hemoglobin: 9.7 g/dL — ABNORMAL LOW (ref 12.0–15.0)
Lymphocytes Relative: 45 %
Lymphs Abs: 2.6 10*3/uL (ref 0.7–4.0)
MCH: 25.6 pg — ABNORMAL LOW (ref 26.0–34.0)
MCHC: 30.5 g/dL (ref 30.0–36.0)
MCV: 83.9 fL (ref 78.0–100.0)
MONO ABS: 0.6 10*3/uL (ref 0.1–1.0)
Monocytes Relative: 10 %
NEUTROS ABS: 2.4 10*3/uL (ref 1.7–7.7)
Neutrophils Relative %: 40 %
Platelets: 279 10*3/uL (ref 150–400)
RBC: 3.79 MIL/uL — ABNORMAL LOW (ref 3.87–5.11)
RDW: 15.7 % — ABNORMAL HIGH (ref 11.5–15.5)
WBC: 5.9 10*3/uL (ref 4.0–10.5)

## 2018-04-29 ENCOUNTER — Encounter (HOSPITAL_COMMUNITY)
Admission: RE | Admit: 2018-04-29 | Discharge: 2018-04-29 | Disposition: A | Discharge status: Home / Self Care | Payer: Medicare Other | Source: Skilled Nursing Facility | Attending: *Deleted | Admitting: *Deleted

## 2018-04-29 DIAGNOSIS — I10 Essential (primary) hypertension: Secondary | ICD-10-CM | POA: Diagnosis not present

## 2018-04-29 LAB — OCCULT BLOOD X 1 CARD TO LAB, STOOL: Fecal Occult Bld: POSITIVE — AB

## 2018-04-30 ENCOUNTER — Other Ambulatory Visit (HOSPITAL_COMMUNITY)
Admission: AD | Admit: 2018-04-30 | Discharge: 2018-04-30 | Disposition: A | Discharge status: Home / Self Care | Payer: No Typology Code available for payment source | Source: Skilled Nursing Facility | Attending: Internal Medicine | Admitting: Internal Medicine

## 2018-04-30 DIAGNOSIS — I10 Essential (primary) hypertension: Secondary | ICD-10-CM | POA: Diagnosis not present

## 2018-04-30 DIAGNOSIS — R41841 Cognitive communication deficit: Secondary | ICD-10-CM | POA: Diagnosis not present

## 2018-04-30 DIAGNOSIS — M6281 Muscle weakness (generalized): Secondary | ICD-10-CM | POA: Insufficient documentation

## 2018-04-30 DIAGNOSIS — F0390 Unspecified dementia without behavioral disturbance: Secondary | ICD-10-CM | POA: Insufficient documentation

## 2018-04-30 DIAGNOSIS — Z5189 Encounter for other specified aftercare: Secondary | ICD-10-CM | POA: Diagnosis present

## 2018-04-30 DIAGNOSIS — J9611 Chronic respiratory failure with hypoxia: Secondary | ICD-10-CM | POA: Diagnosis not present

## 2018-04-30 DIAGNOSIS — R195 Other fecal abnormalities: Secondary | ICD-10-CM | POA: Diagnosis not present

## 2018-04-30 LAB — OCCULT BLOOD X 1 CARD TO LAB, STOOL: FECAL OCCULT BLD: POSITIVE — AB

## 2018-05-01 ENCOUNTER — Encounter: Payer: Self-pay | Admitting: Internal Medicine

## 2018-05-01 ENCOUNTER — Non-Acute Institutional Stay (SKILLED_NURSING_FACILITY): Payer: Medicare Other | Admitting: Internal Medicine

## 2018-05-01 DIAGNOSIS — R195 Other fecal abnormalities: Secondary | ICD-10-CM | POA: Diagnosis not present

## 2018-05-01 DIAGNOSIS — R059 Cough, unspecified: Secondary | ICD-10-CM

## 2018-05-01 DIAGNOSIS — D649 Anemia, unspecified: Secondary | ICD-10-CM | POA: Diagnosis not present

## 2018-05-01 DIAGNOSIS — R05 Cough: Secondary | ICD-10-CM | POA: Diagnosis not present

## 2018-05-01 NOTE — Progress Notes (Signed)
Location:   [facility] Room Number: 115/W Place of Service:  SNF [phone](31) Provider:  Sabino DickArlo Pratik Dalziel  Gupta, Anjali L, MD  Patient Care Team: Mahlon GammonGupta, Anjali L, MD as PCP - General (Internal Medicine) Synetta ShadowLassen, Gwynn Chalker C, PA-C as Physician Assistant (Internal Medicine)  Extended Emergency Contact Information Primary Emergency Contact: [name],[name]  [place] Home Phone: [phone] Relation: Son Secondary Emergency Contact: [name],[name]  [place] Home Phone: [phone] Mobile Phone: [phone] Relation: Son  Code Status:  Full Code Goals of care: Advanced Directive information Advanced Directives 05/01/2018  Does Patient Have a Medical Advance Directive? Yes  Type of Advance Directive (No Data)  Does patient want to make changes to medical advance directive? No - Patient declined  Copy of Healthcare Power of Attorney in Chart? No - copy requested  Pre-existing out of facility DNR order (yellow form or pink MOST form) -     Chief complaint-acute visit follow-up Hemoccult-positive stool  HPI:  Pt is a 84 y.o. female seen today for an acute visit for follow-up of an occult positive stool  She is a long-term resident of facility with a history of hypertension as well as dementia GERD osteoporosis and a history of recurrent vomiting she has resolved with routine Zofran before meals.    She has also been seen recently for a cough----initial x-ray and repeat x-ray done a week ago did not show any acute process- she has completed a course of Augmentin and is completing a course of Mucinex-she also has Atrovent nebulizers as needed  The cough is somewhat persistent she is not really complaining of shortness of breath but still is bothered with the cough  We also noted her hemoglobin had been slowly trending down 2 months- back in January was around 12--appears baseline is been between 4111 and 12 repeat labs it has  slowly trended down  gradually and y was 9.7 on most recent lab  We ordered occult stool blood testing and one  actually has  come back positive  Patient is a poor historian secondary to dementia but per nursing appears to be at her baseline- main complaint is persistent cough  I do note she has a previous colectomy secondary to diverticulitis- also previous history of esophageal dilation with a history of a Schatzki ring-this was in 2014   Past Medical History:  Diagnosis Date  . Asthma   . Dementia   . Diabetes mellitus   . H. pylori infection MAR 2014  . Hypertension   . Stroke [facility](HCC)    Past Surgical History:  Procedure Laterality Date  . CHOLECYSTECTOMY    . COLECTOMY     secondary to diverticulitis  . ESOPHAGOGASTRODUODENOSCOPY (EGD) WITH ESOPHAGEAL DILATION N/A 01/07/2013   [id number]:Schatzki ring was found at the gastroesophageal junction/SINGLE DUODENAL DIVERTICULUM    No Known Allergies  Outpatient Encounter Medications as of 05/01/2018  Medication Sig  . acetaminophen (TYLENOL) 325 MG tablet Take 650 mg by mouth every 6 (six) hours as needed.  . Amino Acids-Protein Hydrolys (FEEDING SUPPLEMENT, PRO-STAT SUGAR FREE 64,) LIQD Take 30 mLs by mouth 2 (two) times daily between meals.  Marland Kitchen. aspirin EC 81 MG tablet Take 81 mg by mouth daily.  [name]. Balsam [place]-Castor Oil (VENELEX) OINT Apply to sacrum and bilateral buttocks q shift & prn every shift  . Calcium Carbonate-Vitamin D (CALCIUM 500/VITAMIN D PO) Take 500 mg by mouth 2 (two) times daily.  . citalopram (CELEXA) 20 MG tablet Take 20  mg by mouth daily.   Marland Kitchen denosumab (PROLIA) 60 MG/ML SOLN injection Inject 60 mg into the skin every 6 (six) months. Administer in upper arm, thigh, or abdomen  . guaiFENesin (MUCINEX) 600 MG 12 hr tablet Take 600 mg by mouth 2 (two) times daily.  Marland Kitchen ipratropium (ATROVENT) 0.02 % nebulizer solution Take 0.5 mg by nebulization every 4 (four) hours as needed for wheezing or shortness of breath.  . loratadine (CLARITIN) 10 MG  tablet Take 10 mg by mouth daily.  . memantine (NAMENDA) 10 MG tablet Take 10 mg by mouth 2 (two) times daily.  . Menthol, Topical Analgesic, (BIOFREEZE) 4 % GEL Apply prn to knees for pain management as needed daily  . mirtazapine (REMERON) 15 MG tablet Take 15 mg by mouth at bedtime.  Marland Kitchen omeprazole (PRILOSEC) 40 MG capsule Take 40 mg by mouth daily.   . ondansetron (ZOFRAN) 4 MG tablet Give 4 mg by mouth four times a day  . [DISCONTINUED] Probiotic Product ([employer] PROBIOTIC) CHEW Give 1 tablet by mouth twice a day   No facility-administered encounter medications on file as of 05/01/2018.     Review of Systems  This is limited secondary to dementia provided by nursing as well.  In general is not complaining of having a fever or chills.  Skin does not complain of diaphoresis rashes or itching.  Head ears eyes nose mouth and throat does not complain of sore throat or visual changes.  Respiratory continues to complain of a dry cough does not appear to really complain of shortness of breath  GI is not complaining of abdominal discomfort apparently vomiting has improved significantly on Zofran  GU is not complaining of dysuria  Musculoskeletal is not complaining of joint pain this afternoon.  Neurologic does not complain of feeling dizzy or having a headache  Psych does have dementia but continues to be pleasant does not appear anxious   Immunization History  Administered Date(s) Administered  . Influenza-Unspecified 07/20/2016, 07/20/2017  . Pneumococcal Conjugate-13 06/12/2017  . Pneumococcal-Unspecified 07/24/2016  . Tdap 07/12/2017   Pertinent  Health Maintenance Due  Topic Date Due  . HEMOGLOBIN A1C  05/18/2018 (Originally 01/06/2018)  . OPHTHALMOLOGY EXAM  05/18/2018 (Originally 04/05/2018)  . URINE MICROALBUMIN  05/18/2018 (Originally 06/03/1942)  . INFLUENZA VACCINE  05/16/2018  . FOOT EXAM  01/24/2019  . DEXA SCAN  Completed  . PNA vac Low Risk Adult  Completed   Fall  Risk  07/12/2017  Falls in the past year? No   Functional Status Survey:    Vitals:   05/01/18 1258  BP: (!) 161/71  Pulse: 83  Resp: 18  Temp: 98.5 F (36.9 C)  TempSrc: Oral  SpO2: 98%  Of note updated manual pressure pressure was 138/60  Physical Exam   In general this is a pleasant elderly female in no distress sitting comfortably in her wheelchair  Her skin is warm and dry.   Eyes visual acuity appears to be intact sclera and conjunctive are clear  Oropharynx is clear mucous membranes moist  Chest is essentially clear to auscultation minimal bronchial sounds on expiration no labored breathing  Abdomen is soft nontender with positive bowel sounds   Musculoskeletal moves her extremities at baseline largely ambulates in wheelchair  Neurologic is grossly intact her speech is clear   Psych she is oriented to self continues to be pleasant appropriate follows simple verbal commands and can carry on a short conversation that is straightforward  Labs reviewed: Recent Labs  11/13/17 1212 11/16/17 0700 03/01/18 0711 04/17/18 1745  NA 139  --  139 140  K 4.1  --  3.7 3.8  CL 102  --  102 104  CO2 28  --  31 28  GLUCOSE 102*  --  91 163*  BUN 13  --  13 17  CREATININE 0.83  --  0.82 0.85  CALCIUM 9.1  --  9.1 8.9  MG  --  1.8  --   --   PHOS  --  3.9  --   --    Recent Labs    10/10/17 0830  AST 19  ALT 9*  ALKPHOS 61  BILITOT 0.6  PROT 6.7  ALBUMIN 3.7   Recent Labs    03/14/18 0714 04/17/18 1745 04/27/18 0345  WBC 4.9 10.4 5.9  NEUTROABS 2.1 8.5* 2.4  HGB 10.3* 10.7* 9.7*  HCT 33.7* 34.7* 31.8*  MCV 85.5 84.4 83.9  PLT 268 283 279   Lab Results  Component Value Date   TSH 1.030 09/11/2017   Lab Results  Component Value Date   HGBA1C 5.5 07/09/2017   No results found for: CHOL, HDL, LDLCALC, LDLDIRECT, TRIG, CHOLHDL  Significant Diagnostic Results in last 30 days:  No results found.  Assessment/Plan  #1-history of anemia with an  occult positive stool-she is on a proton pump inhibitor will add iron as well. At this point will hold aspirin for the time being.  Discussed this with her son Jillyn Hidden via phone- including GI consult-possible subsequent colonoscopy-.  Expressed concerns that she may not tolerate this well with already having significant part of her colon removed-- I would tend to agree that she would not be a great candidate for colonoscopy or aggressive work-up- this also was discussed with Dr. Chales Abrahams via phone  At this point will start the iron-continue to monitor CBCs- appears to be stable in this regards In addition we will order iron studies total iron binding capacity--retic count and ferritin  2.  Cough-she is completing a course of Mucinex also has Atrovent nebulizers as needed-chest x-ray has not shown any acute process she also has completed a course of Augmentin for suspected bronchitis.  Will add Tessalon Perls 100 mg 3 times daily as needed x7 days to see if this helps-continue nebulizers as well  [id number]

## 2018-05-02 ENCOUNTER — Encounter (HOSPITAL_COMMUNITY)
Admission: RE | Admit: 2018-05-02 | Discharge: 2018-05-02 | Disposition: A | Discharge status: Home / Self Care | Payer: Medicare Other | Source: Skilled Nursing Facility | Attending: Internal Medicine | Admitting: Internal Medicine

## 2018-05-02 DIAGNOSIS — R195 Other fecal abnormalities: Secondary | ICD-10-CM | POA: Diagnosis present

## 2018-05-02 DIAGNOSIS — F0390 Unspecified dementia without behavioral disturbance: Secondary | ICD-10-CM | POA: Diagnosis present

## 2018-05-02 DIAGNOSIS — M6281 Muscle weakness (generalized): Secondary | ICD-10-CM | POA: Insufficient documentation

## 2018-05-02 DIAGNOSIS — R41841 Cognitive communication deficit: Secondary | ICD-10-CM | POA: Insufficient documentation

## 2018-05-02 DIAGNOSIS — Z5189 Encounter for other specified aftercare: Secondary | ICD-10-CM | POA: Insufficient documentation

## 2018-05-02 LAB — CBC WITH DIFFERENTIAL/PLATELET
BASOS ABS: 0 10*3/uL (ref 0.0–0.1)
Basophils Relative: 0 %
EOS ABS: 0.3 10*3/uL (ref 0.0–0.7)
EOS PCT: 6 %
HCT: 30.4 % — ABNORMAL LOW (ref 36.0–46.0)
Hemoglobin: 9.2 g/dL — ABNORMAL LOW (ref 12.0–15.0)
Lymphocytes Relative: 38 %
Lymphs Abs: 2 10*3/uL (ref 0.7–4.0)
MCH: 25.5 pg — AB (ref 26.0–34.0)
MCHC: 30.3 g/dL (ref 30.0–36.0)
MCV: 84.2 fL (ref 78.0–100.0)
Monocytes Absolute: 0.6 10*3/uL (ref 0.1–1.0)
Monocytes Relative: 12 %
Neutro Abs: 2.3 10*3/uL (ref 1.7–7.7)
Neutrophils Relative %: 44 %
PLATELETS: 269 10*3/uL (ref 150–400)
RBC: 3.61 MIL/uL — AB (ref 3.87–5.11)
RDW: 15.6 % — ABNORMAL HIGH (ref 11.5–15.5)
WBC: 5.2 10*3/uL (ref 4.0–10.5)

## 2018-05-02 LAB — IRON AND TIBC
IRON: 17 ug/dL — AB (ref 28–170)
SATURATION RATIOS: 5 % — AB (ref 10.4–31.8)
TIBC: 311 ug/dL (ref 250–450)
UIBC: 294 ug/dL

## 2018-05-02 LAB — RETICULOCYTES
RBC.: 3.61 MIL/uL — ABNORMAL LOW (ref 3.87–5.11)
Retic Count, Absolute: 54.2 10*3/uL (ref 19.0–186.0)
Retic Ct Pct: 1.5 % (ref 0.4–3.1)

## 2018-05-02 LAB — OCCULT BLOOD X 1 CARD TO LAB, STOOL: Fecal Occult Bld: POSITIVE — AB

## 2018-05-02 LAB — FERRITIN: FERRITIN: 8 ng/mL — AB (ref 11–307)

## 2018-05-09 ENCOUNTER — Non-Acute Institutional Stay (SKILLED_NURSING_FACILITY): Payer: Medicare Other | Admitting: Internal Medicine

## 2018-05-09 ENCOUNTER — Encounter: Payer: Self-pay | Admitting: Internal Medicine

## 2018-05-09 DIAGNOSIS — R05 Cough: Secondary | ICD-10-CM | POA: Diagnosis not present

## 2018-05-09 DIAGNOSIS — R062 Wheezing: Secondary | ICD-10-CM | POA: Diagnosis not present

## 2018-05-09 DIAGNOSIS — D509 Iron deficiency anemia, unspecified: Secondary | ICD-10-CM | POA: Diagnosis not present

## 2018-05-09 DIAGNOSIS — R059 Cough, unspecified: Secondary | ICD-10-CM

## 2018-05-09 NOTE — Progress Notes (Signed)
Location:   [facility] Room Number: 115/W Place of Service:  SNF [phone]) Provider:  Sabino Dick, MD  Patient Care Team: [name], MD as PCP - General (Internal Medicine) Synetta Shadow as Physician Assistant (Internal Medicine)  Extended Emergency Contact Information Primary Emergency Contact: [name] [place] Home Phone: [phone] Relation: Son Secondary Emergency Contact: [name] [place] Home Phone: [phone] Mobile Phone: [phone] Relation: Son  Code Status:  Full Code Goals of care: Advanced Directive information Advanced Directives 05/09/2018  Does Patient Have a Medical Advance Directive? Yes  Type of Advance Directive (No Data)  Does patient want to make changes to medical advance directive? No - Patient declined  Copy of Healthcare Power of Attorney in Chart? No - copy requested  Pre-existing out of facility DNR order (yellow form or pink MOST form) -     Chief Complaint  Patient presents with  . Acute Visit    Patients c/o Cough and Congestion    HPI:  Pt is a 84 y.o. female seen today for an acute visit for for follow-up of cough and congestion She is a long-term resident of facility with a history of hypertension as well as dementia GERD osteoporosis and a history of recurrent vomiting she has resolved with routine Zofran before meals.    She has also been seen recently for a cough----initial x-ray and repeat x-ray  did not show any acute process- she has completed a course of Augmentin and ihascompleting an extended course of Mucinex-she also has Atrovent nebulizers as needed  We also ordered a short course of Tessalon Perles for continued cough but she appeared to be somewhat sedated by this but this was discontinued in favor of extending the Mucinex  She is not really complaining of shortness of breath but the cough appears to be fairly persistent  saturations are in the 90s on room air  She is also been seen recently for anemia with hemoglobin slowly trending down most recently 9.2- blood testing has been positive- for occult bleeding-we have held her aspiinand started her on iron- studies do show low iron  This was discussed with her son via phone at this point not thought to be a good candidate for an aggressive work-up colonoscopy-.  Update CBC is pending-.      Past Medical History:  Diagnosis Date  . Asthma   . Dementia   . Diabetes mellitus   . H. pylori infection MAR 2014  . Hypertension   . Stroke [facility])    Past Surgical History:  Procedure Laterality Date  . CHOLECYSTECTOMY    . COLECTOMY     secondary to diverticulitis  . ESOPHAGOGASTRODUODENOSCOPY (EGD) WITH ESOPHAGEAL DILATION N/A 01/07/2013   [id number] ring was found at the gastroesophageal junction/SINGLE DUODENAL DIVERTICULUM    No Known Allergies  Outpatient Encounter Medications as of 05/09/2018  Medication Sig  . acetaminophen (TYLENOL) 325 MG tablet Take 650 mg by mouth every 6 (six) hours as needed.  . Amino Acids-Protein Hydrolys (FEEDING SUPPLEMENT, PRO-STAT SUGAR FREE 64,) LIQD Take 30 mLs by mouth 2 (two) times daily between meals.  [name] [place]-Castor Oil (VENELEX) OINT Apply to sacrum and bilateral buttocks q shift & prn every shift  . Calcium Carbonate-Vitamin D (CALCIUM 500/VITAMIN D PO) Take 500 mg by mouth 2 (two) times daily.  . citalopram (CELEXA) 20 MG tablet Take 20 mg by mouth daily.   Marland Kitchen  denosumab (PROLIA) 60 MG/ML SOLN injection Inject 60 mg into the skin every 6 (six) months. Administer in upper arm, thigh, or abdomen  . Ferrous Sulfate (IRON) 325 (65 Fe) MG TABS Take 1 tablet by mouth twice a day  . ipratropium (ATROVENT) 0.02 % nebulizer solution Take 0.5 mg by nebulization every 4 (four) hours as needed for wheezing or shortness of breath.  . loratadine (CLARITIN) 10 MG tablet Take 10 mg by mouth daily.  . memantine (NAMENDA)  10 MG tablet Take 10 mg by mouth 2 (two) times daily.  . Menthol, Topical Analgesic, (BIOFREEZE) 4 % GEL Apply prn to knees for pain management as needed daily  . mirtazapine (REMERON) 15 MG tablet Take 15 mg by mouth at bedtime.  Marland Kitchen. omeprazole (PRILOSEC) 40 MG capsule Take 40 mg by mouth 2 (two) times daily.   . ondansetron (ZOFRAN) 4 MG tablet Give 4 mg by mouth four times a day  . [DISCONTINUED] aspirin EC 81 MG tablet Take 81 mg by mouth daily.  . [DISCONTINUED] guaiFENesin (MUCINEX) 600 MG 12 hr tablet Take 600 mg by mouth 2 (two) times daily.   No facility-administered encounter medications on file as of 05/09/2018.     Review of Systems   This is limited secondary to patient being a poor historian provided by nursing as well  General she is not complaining of any fever or chills.  Skin does not complain of diaphoresis rashes or itching.  Head ears eyes nose mouth and throat is not really complaining of nasal congestion or visual changes or drainage.  Respiratory does complain of a cough nonproductive is not really complaining of shortness of breath   Cardiac is not complaining of chest pain or significant lower extremity edema.  GI does not complain of nausea vomiting diarrhea constipation-at one point did have significant vomiting but Zofran appears to help.  Musculoskeletal is not complaining of joint pain she does have weakness.  Neurologic does not complain of feeling dizzy having a headache or feeling syncopal  Psych does have a history of dementia but does well with supportive care continues to be pleasant  Immunization History  Administered Date(s) Administered  . Influenza-Unspecified 07/20/2016, 07/20/2017  . Pneumococcal Conjugate-13 06/12/2017  . Pneumococcal-Unspecified 07/24/2016  . Tdap 07/12/2017   Pertinent  Health Maintenance Due  Topic Date Due  . HEMOGLOBIN A1C  05/18/2018 (Originally 01/06/2018)  . OPHTHALMOLOGY EXAM  05/18/2018 (Originally  04/05/2018)  . URINE MICROALBUMIN  05/18/2018 (Originally 06/03/1942)  . INFLUENZA VACCINE  05/16/2018  . FOOT EXAM  01/24/2019  . DEXA SCAN  Completed  . PNA vac Low Risk Adult  Completed   Fall Risk  07/12/2017  Falls in the past year? No   Functional Status Survey:    Vitals:   05/09/18 1446  BP: 118/62  Pulse: 73  Resp: 17  Temp: 98.2 F (36.8 C)  TempSrc: Oral  SpO2: 96%    Physical Exam   In general this is a pleasant elderly female in no distress sitting comfortably in her wheelchair.  Her skin is warm and dry.  Eyes sclera and conjunctive are clear visual acuity appears to be grossly intact she does not have any drainage.  Oropharynx is clear mucous membranes moist.  Chest she does have some wheezing on expiration there is no labored breathing.  Heart is regular rate and rhythm without murmur gallop or rub she does not have significant lower extremity edema  Abdomen is soft nontender with positive bowel  sounds.  Skeletal ambulates in a wheelchair moves all her extremities at baseline baseline lower extremity weakness.  Neurologic is grossly intact her speech is clear could not really appreciate lateralizing findings.  Psych she is oriented to self pleasant and appropriate  Labs reviewed: Recent Labs    11/13/17 1212 11/16/17 0700 03/01/18 0711 04/17/18 1745  NA 139  --  139 140  K 4.1  --  3.7 3.8  CL 102  --  102 104  CO2 28  --  31 28  GLUCOSE 102*  --  91 163*  BUN 13  --  13 17  CREATININE 0.83  --  0.82 0.85  CALCIUM 9.1  --  9.1 8.9  MG  --  1.8  --   --   PHOS  --  3.9  --   --    Recent Labs    10/10/17 0830  AST 19  ALT 9*  ALKPHOS 61  BILITOT 0.6  PROT 6.7  ALBUMIN 3.7   Recent Labs    04/17/18 1745 04/27/18 0345 05/02/18 0703  WBC 10.4 5.9 5.2  NEUTROABS 8.5* 2.4 2.3  HGB 10.7* 9.7* 9.2*  HCT 34.7* 31.8* 30.4*  MCV 84.4 83.9 84.2  PLT 283 279 269   Lab Results  Component Value Date   TSH 1.030 09/11/2017   Lab  Results  Component Value Date   HGBA1C 5.5 07/09/2017   No results found for: CHOL, HDL, LDLCALC, LDLDIRECT, TRIG, CHOLHDL  Significant Diagnostic Results in last 30 days:  No results found.  Assessment/Plan  #1 continued cough with some mild wheezing congestion- is not in any distress here stats are satisfactory but this is persistent-will recheck a chest x-ray.  Also will do a short course of prednisone 10 mg a day for 5 days.  Restart Atrovent nebulizers routinely for 5 days every 6 hours and then as needed- also  start Mucinex 600 mg twice daily for 7 days.  Monitor vital signs pulse ox to shift for 72 hours.  This plan was discussed with Dr.Gupta  #2 anemia as noted above she is now on iron-aspirin is being held- update CBC is pending- Clinically she appears to be at baseline.  [id number]- of note greater than 25 minutes spent assessing patient-reviewing her chart and labs- discussing her status with nursing staff- coordinating and formulating a plan of care- of note greater than 50% of time spent coordinating a plan of care with input as noted above      London Sheer, [place] [phone]

## 2018-05-16 ENCOUNTER — Encounter (HOSPITAL_COMMUNITY)
Admission: RE | Admit: 2018-05-16 | Discharge: 2018-05-16 | Disposition: A | Discharge status: Home / Self Care | Payer: Medicare Other | Source: Skilled Nursing Facility | Attending: Internal Medicine | Admitting: Internal Medicine

## 2018-05-16 DIAGNOSIS — Z5189 Encounter for other specified aftercare: Secondary | ICD-10-CM | POA: Insufficient documentation

## 2018-05-16 DIAGNOSIS — R195 Other fecal abnormalities: Secondary | ICD-10-CM | POA: Diagnosis present

## 2018-05-16 DIAGNOSIS — M6281 Muscle weakness (generalized): Secondary | ICD-10-CM | POA: Diagnosis present

## 2018-05-16 DIAGNOSIS — R41841 Cognitive communication deficit: Secondary | ICD-10-CM | POA: Diagnosis present

## 2018-05-16 DIAGNOSIS — F0390 Unspecified dementia without behavioral disturbance: Secondary | ICD-10-CM | POA: Diagnosis not present

## 2018-05-16 LAB — CBC WITH DIFFERENTIAL/PLATELET
Basophils Absolute: 0.1 10*3/uL (ref 0.0–0.1)
Basophils Relative: 1 %
Eosinophils Absolute: 0.3 10*3/uL (ref 0.0–0.7)
Eosinophils Relative: 4 %
HEMATOCRIT: 35.2 % — AB (ref 36.0–46.0)
Hemoglobin: 10.7 g/dL — ABNORMAL LOW (ref 12.0–15.0)
Lymphocytes Relative: 34 %
Lymphs Abs: 2.4 10*3/uL (ref 0.7–4.0)
MCH: 26.6 pg (ref 26.0–34.0)
MCHC: 30.4 g/dL (ref 30.0–36.0)
MCV: 87.3 fL (ref 78.0–100.0)
MONO ABS: 0.8 10*3/uL (ref 0.1–1.0)
MONOS PCT: 12 %
NEUTROS ABS: 3.4 10*3/uL (ref 1.7–7.7)
Neutrophils Relative %: 49 %
Platelets: 281 10*3/uL (ref 150–400)
RBC: 4.03 MIL/uL (ref 3.87–5.11)
RDW: 19.1 % — ABNORMAL HIGH (ref 11.5–15.5)
WBC: 7 10*3/uL (ref 4.0–10.5)

## 2018-05-21 ENCOUNTER — Non-Acute Institutional Stay (SKILLED_NURSING_FACILITY): Payer: Medicare Other | Admitting: Internal Medicine

## 2018-05-21 ENCOUNTER — Encounter: Payer: Self-pay | Admitting: Internal Medicine

## 2018-05-21 DIAGNOSIS — F0390 Unspecified dementia without behavioral disturbance: Secondary | ICD-10-CM

## 2018-05-21 DIAGNOSIS — R05 Cough: Secondary | ICD-10-CM

## 2018-05-21 DIAGNOSIS — R739 Hyperglycemia, unspecified: Secondary | ICD-10-CM | POA: Diagnosis not present

## 2018-05-21 DIAGNOSIS — D509 Iron deficiency anemia, unspecified: Secondary | ICD-10-CM | POA: Diagnosis not present

## 2018-05-21 DIAGNOSIS — K224 Dyskinesia of esophagus: Secondary | ICD-10-CM | POA: Diagnosis not present

## 2018-05-21 DIAGNOSIS — D649 Anemia, unspecified: Secondary | ICD-10-CM | POA: Insufficient documentation

## 2018-05-21 DIAGNOSIS — R059 Cough, unspecified: Secondary | ICD-10-CM

## 2018-05-21 NOTE — Progress Notes (Signed)
Location:   [facility] Room Number: 115/W Place of Service:  SNF [phone]([id number]) Provider:Anjali Chales AbrahamsGupta MD  Mahlon GammonGupta, Anjali L, MD  Patient Care Team: Mahlon GammonGupta, Anjali L, MD as PCP - General (Internal Medicine) Synetta ShadowLassen, Arlo C, PA-C as Physician Assistant (Internal Medicine)  Extended Emergency Contact Information Primary Emergency Contact: [name],[name]  [place] Home Phone: [phone] Relation: Son Secondary Emergency Contact: [name],[name]  [place] Home Phone: [phone] Mobile Phone: [phone] Relation: Son  Code Status:  Full Code Goals of care: Advanced Directive information Advanced Directives 05/09/2018  Does Patient Have a Medical Advance Directive? Yes  Type of Advance Directive (No Data)  Does patient want to make changes to medical advance directive? No - Patient declined  Copy of Healthcare Power of Attorney in Chart? No - copy requested  Pre-existing out of facility DNR order (yellow form or pink MOST form) -     Chief Complaint  Patient presents with  . Medical Management of Chronic Issues    Patient is being seen for routine visit of Medical Management    HPI:  Pt is a 84 y.o. female seen today for medical management of chronic diseases.   Patient has H/O Hypertension,Osteoporosis, recurrent Vomiting, with  GERD and Dementia.   Patient is a long-term resident of facility.  She was recently had cough and was treated with Conservatively. Her chest x-ray was negative. She also had positive Occult blood in her stool  after dropping her hemoglobin.  It was discussed with her son and the POA who did not want any aggressive work-up on her.  Patient was started on iron and her hemoglobin had stabilized. Patient unable to give me any history due to her dementia Her weight is 125 pounds  Past Medical History:  Diagnosis Date  . Asthma   . Dementia   . Diabetes mellitus   . H. pylori infection MAR 2014  . Hypertension    . Stroke [facility](HCC)    Past Surgical History:  Procedure Laterality Date  . CHOLECYSTECTOMY    . COLECTOMY     secondary to diverticulitis  . ESOPHAGOGASTRODUODENOSCOPY (EGD) WITH ESOPHAGEAL DILATION N/A 01/07/2013   [id number]:Schatzki ring was found at the gastroesophageal junction/SINGLE DUODENAL DIVERTICULUM    No Known Allergies  Outpatient Encounter Medications as of 05/21/2018  Medication Sig  . acetaminophen (TYLENOL) 325 MG tablet Take 650 mg by mouth every 6 (six) hours as needed.  . Amino Acids-Protein Hydrolys (FEEDING SUPPLEMENT, PRO-STAT SUGAR FREE 64,) LIQD Take 30 mLs by mouth 2 (two) times daily between meals.  [name]. Balsam [place]-Castor Oil (VENELEX) OINT Apply to sacrum and bilateral buttocks q shift & prn every shift  . Calcium Carbonate-Vitamin D (CALCIUM 500/VITAMIN D PO) Take 500 mg by mouth 2 (two) times daily.  . citalopram (CELEXA) 20 MG tablet Take 20 mg by mouth daily.   Marland Kitchen. denosumab (PROLIA) 60 MG/ML SOLN injection Inject 60 mg into the skin every 6 (six) months. Administer in upper arm, thigh, or abdomen  . Ferrous Sulfate (IRON) 325 (65 Fe) MG TABS Take 1 tablet by mouth twice a day  . ipratropium (ATROVENT) 0.02 % nebulizer solution Take 0.5 mg by nebulization every 4 (four) hours as needed for wheezing or shortness of breath.  . loratadine (CLARITIN) 10 MG tablet Take 10 mg by mouth daily.  . memantine (NAMENDA) 10 MG tablet Take 10 mg by mouth 2 (two) times daily.  . Menthol, Topical Analgesic, (BIOFREEZE) 4 % GEL  Apply prn to knees for pain management as needed daily  . mirtazapine (REMERON) 15 MG tablet Take 15 mg by mouth at bedtime.  Marland Kitchen omeprazole (PRILOSEC) 40 MG capsule Take 40 mg by mouth 2 (two) times daily.   . ondansetron (ZOFRAN) 4 MG tablet Give 4 mg by mouth four times a day   No facility-administered encounter medications on file as of 05/21/2018.      Review of Systems  Unable to perform ROS: Dementia    Immunization History  Administered Date(s)  Administered  . Influenza-Unspecified 07/20/2016, 07/20/2017  . Pneumococcal Conjugate-13 06/12/2017  . Pneumococcal-Unspecified 07/24/2016  . Tdap 07/12/2017   Pertinent  Health Maintenance Due  Topic Date Due  . INFLUENZA VACCINE  06/21/2018 (Originally 05/16/2018)  . HEMOGLOBIN A1C  06/21/2018 (Originally 01/06/2018)  . OPHTHALMOLOGY EXAM  06/21/2018 (Originally 04/05/2018)  . URINE MICROALBUMIN  06/21/2018 (Originally 06/03/1942)  . FOOT EXAM  01/24/2019  . DEXA SCAN  Completed  . PNA vac Low Risk Adult  Completed   Fall Risk  07/12/2017  Falls in the past year? No   Functional Status Survey:    Vitals:   05/21/18 1519  BP: 116/66  Pulse: 76  Resp: 18  Temp: 98.9 F (37.2 C)  TempSrc: Oral  SpO2: 96%  Weight: 125 lb (56.7 kg)  Height: 5\' 5"  (1.651 m)   Body mass index is 20.8 kg/m. Physical Exam  Constitutional: She appears well-developed and well-nourished.  HENT:  Head: Normocephalic.  Mouth/Throat: Oropharynx is clear and moist.  Eyes: Pupils are equal, round, and reactive to light.  Neck: Neck supple.  Cardiovascular: Normal rate and regular rhythm.  No murmur heard. Pulmonary/Chest: Effort normal and breath sounds normal. No stridor. No respiratory distress. She has no wheezes.  Abdominal: Soft. Bowel sounds are normal. She exhibits no distension. There is no tenderness. There is no guarding.  Musculoskeletal: She exhibits no edema.  Lymphadenopathy:    She has no cervical adenopathy.  Neurological: She is alert.  Skin: Skin is warm and dry.  Psychiatric: She has a normal mood and affect. Her behavior is normal.    Labs reviewed: Recent Labs    11/13/17 1212 11/16/17 0700 03/01/18 0711 04/17/18 1745  NA 139  --  139 140  K 4.1  --  3.7 3.8  CL 102  --  102 104  CO2 28  --  31 28  GLUCOSE 102*  --  91 163*  BUN 13  --  13 17  CREATININE 0.83  --  0.82 0.85  CALCIUM 9.1  --  9.1 8.9  MG  --  1.8  --   --   PHOS  --  3.9  --   --    Recent Labs     10/10/17 0830  AST 19  ALT 9*  ALKPHOS 61  BILITOT 0.6  PROT 6.7  ALBUMIN 3.7   Recent Labs    04/27/18 0345 05/02/18 0703 05/16/18 0700  WBC 5.9 5.2 7.0  NEUTROABS 2.4 2.3 3.4  HGB 9.7* 9.2* 10.7*  HCT 31.8* 30.4* 35.2*  MCV 83.9 84.2 87.3  PLT 279 269 281   Lab Results  Component Value Date   TSH 1.030 09/11/2017   Lab Results  Component Value Date   HGBA1C 5.5 07/09/2017   No results found for: CHOL, HDL, LDLCALC, LDLDIRECT, TRIG, CHOLHDL  Significant Diagnostic Results in last 30 days:  No results found.  Assessment/Plan Cough Now resolved Anemia with Occult Positive  Discussion with her son patient is not a candidate for any aggressive work-up We will decrease her iron to daily Continue Protonix twice daily Repeat CBC in 2 weeks Hyperglycemia Patient has a history of diabetes. Her blood sugars always have been less than 100.  She is not on any hypoglycemia agent I saw her recent BMP with blood sugars of 160 Can be due to Prednisone We will repeat A1c Will start doing Accu-Cheks Q weeks Essential hypertension Patient was taken off her antihypertensives as her blood pressure was running low .  blood pressure has been stable.  Age-related osteoporosis Patient on Prolia and tolerating it well  Esophageal dysmotility She is stable on Prilosec and Zofran before meals We will decrease the dose of Zofran to 3 times daily and see if she tolerates that GERD She did not tolerate daily Prilosec so it was changed to twice daily Depression Patient stays on Remeron and Celexa  Dementia Stable on Namenda   Family/ staff Communication:   Labs/tests ordered:    Total time spent in this patient care encounter was _25 minutes; greater than 50% of the visit spent counseling patient, reviewing records , Labs and coordinating care for problems addressed at this encounter.

## 2018-05-30 ENCOUNTER — Encounter (HOSPITAL_COMMUNITY)
Admission: RE | Admit: 2018-05-30 | Discharge: 2018-05-30 | Disposition: A | Discharge status: Home / Self Care | Payer: Medicare Other | Source: Skilled Nursing Facility | Attending: Internal Medicine | Admitting: Internal Medicine

## 2018-05-30 DIAGNOSIS — F0390 Unspecified dementia without behavioral disturbance: Secondary | ICD-10-CM | POA: Insufficient documentation

## 2018-05-30 DIAGNOSIS — M6281 Muscle weakness (generalized): Secondary | ICD-10-CM | POA: Insufficient documentation

## 2018-05-30 DIAGNOSIS — R41841 Cognitive communication deficit: Secondary | ICD-10-CM | POA: Diagnosis present

## 2018-05-30 DIAGNOSIS — R195 Other fecal abnormalities: Secondary | ICD-10-CM | POA: Diagnosis present

## 2018-05-30 DIAGNOSIS — Z5189 Encounter for other specified aftercare: Secondary | ICD-10-CM | POA: Insufficient documentation

## 2018-05-30 LAB — CBC WITH DIFFERENTIAL/PLATELET
Basophils Absolute: 0 10*3/uL (ref 0.0–0.1)
Basophils Relative: 0 %
EOS ABS: 0.2 10*3/uL (ref 0.0–0.7)
EOS PCT: 3 %
HCT: 34.6 % — ABNORMAL LOW (ref 36.0–46.0)
HEMOGLOBIN: 10.5 g/dL — AB (ref 12.0–15.0)
LYMPHS ABS: 1.6 10*3/uL (ref 0.7–4.0)
Lymphocytes Relative: 35 %
MCH: 27.2 pg (ref 26.0–34.0)
MCHC: 30.3 g/dL (ref 30.0–36.0)
MCV: 89.6 fL (ref 78.0–100.0)
MONOS PCT: 10 %
Monocytes Absolute: 0.5 10*3/uL (ref 0.1–1.0)
NEUTROS PCT: 52 %
Neutro Abs: 2.4 10*3/uL (ref 1.7–7.7)
Platelets: 254 10*3/uL (ref 150–400)
RBC: 3.86 MIL/uL — ABNORMAL LOW (ref 3.87–5.11)
RDW: 20.3 % — ABNORMAL HIGH (ref 11.5–15.5)
WBC: 4.7 10*3/uL (ref 4.0–10.5)

## 2018-05-30 LAB — HEMOGLOBIN A1C
HEMOGLOBIN A1C: 5.2 % (ref 4.8–5.6)
Mean Plasma Glucose: 102.54 mg/dL

## 2018-06-27 ENCOUNTER — Non-Acute Institutional Stay (SKILLED_NURSING_FACILITY): Payer: Medicare Other | Admitting: Internal Medicine

## 2018-06-27 ENCOUNTER — Encounter: Payer: Self-pay | Admitting: Internal Medicine

## 2018-06-27 DIAGNOSIS — D509 Iron deficiency anemia, unspecified: Secondary | ICD-10-CM

## 2018-06-27 DIAGNOSIS — K224 Dyskinesia of esophagus: Secondary | ICD-10-CM

## 2018-06-27 DIAGNOSIS — I10 Essential (primary) hypertension: Secondary | ICD-10-CM

## 2018-06-27 DIAGNOSIS — M810 Age-related osteoporosis without current pathological fracture: Secondary | ICD-10-CM

## 2018-06-27 DIAGNOSIS — F0390 Unspecified dementia without behavioral disturbance: Secondary | ICD-10-CM

## 2018-06-27 NOTE — Progress Notes (Signed)
.  This is a routine visit.  Level of care is skilled.  [facility]  Chief complaint- medical management of chronic medical conditions including dementia-hypertension-osteoporosis as well as recurrent vomiting with history of GERD and esophageal dysmotility.  And anemia  History of present illness.  Patient is a pleasant 84 year old female with the above diagnosis she appears to be having a period of stability  She recently was treated for a cough treated conservatively and appears to have responded well to this she is not complaining of cough today.  She also has had positive occult blood in her stool and hemoglobin initially dropped somewhat- I did discuss this with her son-at this point was thought not to be a good candidate for aggressive intervention- which I agree with.  We have started her on iron- she is on a proton pump inhibitor twice daily-hemoglobin appears to have risen and stabilized was 10.5 on lab done last month we will update this.  Her other medical issues appear to be stable she does have a history of osteoporosis and continues on Prolia which appears to be well tolerated.  At one point she had recurrent vomiting which was quite significant with a history of esophageal dysmotility but she has responded well to Zofran before meals as well as a proton pump inhibitor.  She also at one point was thought possibly to have some elevated blood sugars with a metabolic panel showing a blood sugar in the 160s however hemoglobin A1c is only 5.2-- Her weight appears to be relatively stable at 123 pounds over recent months her weight has hovered in the 120s range.  Regards to hypertension at one point was on medication but she had low blood pressures- currently not on any antihypertensives I did get a manual reading of 148/68 tonight-however previous readings were 114/65- 133/71 appears at times she is got occasional variations and I suspect me evaluating her possibly  raised her blood pressure slightly as well  Currently she is sitting in her wheelchair comfortably at baseline has no complaints continues to be very pleasant and cooperative  Past Medical History:  Diagnosis Date  . Asthma   . Dementia   . Diabetes mellitus   . H. pylori infection MAR 2014  . Hypertension   . Stroke [facility](HCC)         Past Surgical History:  Procedure Laterality Date  . CHOLECYSTECTOMY    . COLECTOMY     secondary to diverticulitis  . ESOPHAGOGASTRODUODENOSCOPY (EGD) WITH ESOPHAGEAL DILATION N/A 01/07/2013   [id number]:Schatzki ring was found at the gastroesophageal junction/SINGLE DUODENAL DIVERTICULUM    No Known Allergies       MEDICATIONS  Medication Sig  . acetaminophen (TYLENOL) 325 MG tablet Take 650 mg by mouth every 6 (six) hours as needed.  . Amino Acids-Protein Hydrolys (FEEDING SUPPLEMENT, PRO-STAT SUGAR FREE 64,) LIQD Take 30 mLs by mouth 2 (two) times daily between meals.  [name]. Balsam [place]-Castor Oil (VENELEX) OINT Apply to sacrum and bilateral buttocks q shift & prn every shift  . Calcium Carbonate-Vitamin D (CALCIUM 500/VITAMIN D PO) Take 500 mg by mouth 2 (two) times daily.  . citalopram (CELEXA) 20 MG tablet Take 20 mg by mouth daily.   Marland Kitchen. denosumab (PROLIA) 60 MG/ML SOLN injection Inject 60 mg into the skin every 6 (six) months. Administer in upper arm, thigh, or abdomen  . Ferrous Sulfate (IRON) 325 (65 Fe) MG TABS Take 1 tablet by mouth twice a day  . ipratropium (ATROVENT)  0.02 % nebulizer solution Take 0.5 mg by nebulization every 4 (four) hours as needed for wheezing or shortness of breath.  . loratadine (CLARITIN) 10 MG tablet Take 10 mg by mouth daily.  . memantine (NAMENDA) 10 MG tablet Take 10 mg by mouth 2 (two) times daily.  . Menthol, Topical Analgesic, (BIOFREEZE) 4 % GEL Apply prn to knees for pain management as needed daily  . mirtazapine (REMERON) 15 MG tablet Take 15 mg by mouth at bedtime.  Marland Kitchen omeprazole (PRILOSEC) 40 MG  capsule Take 40 mg by mouth 2 (two) times daily.   . ondansetron (ZOFRAN) 4 MG tablet Give 4 mg by mouth four times a day   No facility-administered encounter medications on file as of 05/21/2018.    Review of systems.  This is a limited secondary to dementia.  But she does not have any complaints this evening does not complain of cough shortness of breath chest pain- says she tries to eat as much as she can.  Nursing does not report any recent issues  Physical exam.  She is afebrile pulse is 84 respirations of 17 blood pressure taken manually 148/62.  In general this is a very pleasant well-nourished elderly female in no distress sitting comfortably in her wheelchair.  Her skin is warm and dry.  Eyes sclera conjunctive are clear visual acuity appears to be intact.  Oropharynx is clear mucous membranes moist.  Chest is clear to auscultation there is no labored breathing air entry is somewhat shallow.  Heart is regular rate and rhythm without murmur gallop or rub she has scant lower extremity edema has stockings on.  Abdomen is soft nontender with positive bowel sounds.  Musculoskeletal moves all extremities x4 at baseline ambulates in a wheelchair  Neurologic she is alert appears grossly intact without any overt lateralizing findings her speech is clear.  Psych she is oriented to self is pleasant cooperative follows simple verbal commands without difficulty continues to be very pleasant   This.  May 30, 2018.  WBC 4.7 hemoglobin 10.5 platelets 254.  Hemoglobin A1c 5.2.  April 17, 2018.  Sodium 140 potassium 3.8 BUN 17 creatinine 0.81.  October 10, 2017.  Liver function tests are normal limits except ALT mildly reduced at 9  Assessment and plan.  1.-  #1 history of anemia with occult positive stool- per previous discussion with her son most likely is not a good candidate for any aggressive work-up- she has been started on iron and is on a proton pump inhibitor  twice a day and hemoglobin appears to be responded- will update a CBC  #2- hypertension- systolic is somewhat elevated today but this does not appear to be consistent-and she actually has had low blood pressure readings with antihypertensive- at this point will continue to monitor  #3 history of osteoporosis she is on Prolia and appears to continue to tolerate this well.  4.  History of vomiting with esophageal dysmotility-she is on Prilosec and Zofran- she appears to be doing well on this as well she does receive the Zofran before meals.  5.-  History of dementia she appears to be doing well with supportive care weight appears to be relatively stable does not report any behaviors continues to be very pleasant and cooperative she is on Namenda.  6.  History of cough again she appears to have responded to the conservative therapy- will monitor she does have duo nebs as needed-she is also on Claritin for history of allergic rhinitis.  7.  History of hyperglycemia-again hemoglobin A1c shows stability at 5.2.  8.  History of depression this appears stabilized on Celexa-she is also on Remeron I suspect the Remeron also is to help with appetite stimulation.  Again will update a CBC with her history of anemia with occult positive stools and update a metabolic panel as well for updated values clinically she appears to be at baseline nursing does not report any recent acute issues.  [id number]

## 2018-06-28 ENCOUNTER — Encounter (HOSPITAL_COMMUNITY)
Admission: RE | Admit: 2018-06-28 | Discharge: 2018-06-28 | Disposition: A | Discharge status: Home / Self Care | Payer: Medicare Other | Source: Skilled Nursing Facility | Attending: Internal Medicine | Admitting: Internal Medicine

## 2018-06-28 DIAGNOSIS — R195 Other fecal abnormalities: Secondary | ICD-10-CM | POA: Diagnosis present

## 2018-06-28 DIAGNOSIS — I10 Essential (primary) hypertension: Secondary | ICD-10-CM | POA: Diagnosis not present

## 2018-06-28 DIAGNOSIS — Z5189 Encounter for other specified aftercare: Secondary | ICD-10-CM | POA: Diagnosis present

## 2018-06-28 DIAGNOSIS — F0390 Unspecified dementia without behavioral disturbance: Secondary | ICD-10-CM | POA: Diagnosis present

## 2018-06-28 LAB — CBC WITH DIFFERENTIAL/PLATELET
Basophils Absolute: 0 10*3/uL (ref 0.0–0.1)
Basophils Relative: 1 %
Eosinophils Absolute: 0.1 10*3/uL (ref 0.0–0.7)
Eosinophils Relative: 2 %
HEMATOCRIT: 36.7 % (ref 36.0–46.0)
Hemoglobin: 11.5 g/dL — ABNORMAL LOW (ref 12.0–15.0)
LYMPHS ABS: 1.4 10*3/uL (ref 0.7–4.0)
LYMPHS PCT: 31 %
MCH: 28.5 pg (ref 26.0–34.0)
MCHC: 31.3 g/dL (ref 30.0–36.0)
MCV: 90.8 fL (ref 78.0–100.0)
Monocytes Absolute: 0.7 10*3/uL (ref 0.1–1.0)
Monocytes Relative: 15 %
NEUTROS ABS: 2.2 10*3/uL (ref 1.7–7.7)
Neutrophils Relative %: 51 %
Platelets: 215 10*3/uL (ref 150–400)
RBC: 4.04 MIL/uL (ref 3.87–5.11)
RDW: 19 % — ABNORMAL HIGH (ref 11.5–15.5)
WBC: 4.4 10*3/uL (ref 4.0–10.5)

## 2018-06-28 LAB — BASIC METABOLIC PANEL
Anion gap: 7 (ref 5–15)
BUN: 12 mg/dL (ref 8–23)
CHLORIDE: 109 mmol/L (ref 98–111)
CO2: 26 mmol/L (ref 22–32)
CREATININE: 0.71 mg/dL (ref 0.44–1.00)
Calcium: 8.3 mg/dL — ABNORMAL LOW (ref 8.9–10.3)
GFR calc Af Amer: >60 mL/min (ref >60)
GFR calc non Af Amer: >60 mL/min (ref >60)
Glucose, Bld: 88 mg/dL (ref 70–99)
Potassium: 3.6 mmol/L (ref 3.5–5.1)
Sodium: 142 mmol/L (ref 135–145)

## 2018-06-30 ENCOUNTER — Inpatient Hospital Stay (HOSPITAL_COMMUNITY)
Admission: EM | Admit: 2018-06-30 | Discharge: 2018-07-02 | DRG: 871 | Disposition: A | Discharge status: Skilled Nursing Facility | Payer: Medicare Other | Attending: Internal Medicine | Admitting: Internal Medicine

## 2018-06-30 ENCOUNTER — Other Ambulatory Visit: Payer: Self-pay

## 2018-06-30 ENCOUNTER — Encounter (HOSPITAL_COMMUNITY): Payer: Self-pay | Admitting: Emergency Medicine

## 2018-06-30 ENCOUNTER — Emergency Department (HOSPITAL_COMMUNITY): Payer: Medicare Other

## 2018-06-30 DIAGNOSIS — E876 Hypokalemia: Secondary | ICD-10-CM | POA: Diagnosis present

## 2018-06-30 DIAGNOSIS — Z8673 Personal history of transient ischemic attack (TIA), and cerebral infarction without residual deficits: Secondary | ICD-10-CM

## 2018-06-30 DIAGNOSIS — G9341 Metabolic encephalopathy: Secondary | ICD-10-CM | POA: Diagnosis present

## 2018-06-30 DIAGNOSIS — J690 Pneumonitis due to inhalation of food and vomit: Secondary | ICD-10-CM | POA: Diagnosis not present

## 2018-06-30 DIAGNOSIS — E1165 Type 2 diabetes mellitus with hyperglycemia: Secondary | ICD-10-CM | POA: Diagnosis present

## 2018-06-30 DIAGNOSIS — J45909 Unspecified asthma, uncomplicated: Secondary | ICD-10-CM | POA: Diagnosis present

## 2018-06-30 DIAGNOSIS — A419 Sepsis, unspecified organism: Principal | ICD-10-CM | POA: Diagnosis present

## 2018-06-30 DIAGNOSIS — Z23 Encounter for immunization: Secondary | ICD-10-CM

## 2018-06-30 DIAGNOSIS — Z79899 Other long term (current) drug therapy: Secondary | ICD-10-CM | POA: Diagnosis not present

## 2018-06-30 DIAGNOSIS — J9601 Acute respiratory failure with hypoxia: Secondary | ICD-10-CM

## 2018-06-30 DIAGNOSIS — D649 Anemia, unspecified: Secondary | ICD-10-CM | POA: Diagnosis present

## 2018-06-30 DIAGNOSIS — R05 Cough: Secondary | ICD-10-CM

## 2018-06-30 DIAGNOSIS — J189 Pneumonia, unspecified organism: Secondary | ICD-10-CM

## 2018-06-30 DIAGNOSIS — R059 Cough, unspecified: Secondary | ICD-10-CM

## 2018-06-30 DIAGNOSIS — R1313 Dysphagia, pharyngeal phase: Secondary | ICD-10-CM | POA: Diagnosis present

## 2018-06-30 DIAGNOSIS — I10 Essential (primary) hypertension: Secondary | ICD-10-CM | POA: Diagnosis present

## 2018-06-30 DIAGNOSIS — F0390 Unspecified dementia without behavioral disturbance: Secondary | ICD-10-CM | POA: Diagnosis present

## 2018-06-30 DIAGNOSIS — R739 Hyperglycemia, unspecified: Secondary | ICD-10-CM | POA: Diagnosis present

## 2018-06-30 LAB — URINALYSIS, ROUTINE W REFLEX MICROSCOPIC
Bacteria, UA: NONE SEEN
Bilirubin Urine: NEGATIVE
GLUCOSE, UA: 150 mg/dL — AB
HGB URINE DIPSTICK: NEGATIVE
KETONES UR: NEGATIVE mg/dL
NITRITE: NEGATIVE
PH: 5 (ref 5.0–8.0)
Protein, ur: 30 mg/dL — AB
Specific Gravity, Urine: 1.024 (ref 1.005–1.030)

## 2018-06-30 LAB — COMPREHENSIVE METABOLIC PANEL
ALT: 11 U/L (ref 0–44)
ANION GAP: 10 (ref 5–15)
AST: 23 U/L (ref 15–41)
Albumin: 3.2 g/dL — ABNORMAL LOW (ref 3.5–5.0)
Alkaline Phosphatase: 53 U/L (ref 38–126)
BUN: 18 mg/dL (ref 8–23)
CALCIUM: 8.4 mg/dL — AB (ref 8.9–10.3)
CO2: 24 mmol/L (ref 22–32)
Chloride: 104 mmol/L (ref 98–111)
Creatinine, Ser: 0.83 mg/dL (ref 0.44–1.00)
GFR calc non Af Amer: >60 mL/min (ref >60)
Glucose, Bld: 233 mg/dL — ABNORMAL HIGH (ref 70–99)
Potassium: 3.4 mmol/L — ABNORMAL LOW (ref 3.5–5.1)
SODIUM: 138 mmol/L (ref 135–145)
Total Bilirubin: 0.6 mg/dL (ref 0.3–1.2)
Total Protein: 6.5 g/dL (ref 6.5–8.1)

## 2018-06-30 LAB — I-STAT CG4 LACTIC ACID, ED: Lactic Acid, Venous: 3.05 mmol/L (ref 0.5–1.9)

## 2018-06-30 LAB — CBC WITH DIFFERENTIAL/PLATELET
BASOS PCT: 0 %
Basophils Absolute: 0 10*3/uL (ref 0.0–0.1)
Eosinophils Absolute: 0 10*3/uL (ref 0.0–0.7)
Eosinophils Relative: 0 %
HEMATOCRIT: 35.9 % — AB (ref 36.0–46.0)
HEMOGLOBIN: 11.5 g/dL — AB (ref 12.0–15.0)
Lymphocytes Relative: 5 %
Lymphs Abs: 0.7 10*3/uL (ref 0.7–4.0)
MCH: 29.1 pg (ref 26.0–34.0)
MCHC: 32 g/dL (ref 30.0–36.0)
MCV: 90.9 fL (ref 78.0–100.0)
MONOS PCT: 5 %
Monocytes Absolute: 0.6 10*3/uL (ref 0.1–1.0)
NEUTROS ABS: 11.4 10*3/uL — AB (ref 1.7–7.7)
NEUTROS PCT: 90 %
Platelets: 188 10*3/uL (ref 150–400)
RBC: 3.95 MIL/uL (ref 3.87–5.11)
RDW: 19.7 % — ABNORMAL HIGH (ref 11.5–15.5)
WBC: 12.7 10*3/uL — ABNORMAL HIGH (ref 4.0–10.5)

## 2018-06-30 LAB — INFLUENZA PANEL BY PCR (TYPE A & B)
INFLBPCR: NEGATIVE
Influenza A By PCR: NEGATIVE

## 2018-06-30 LAB — PROCALCITONIN: Procalcitonin: 1.08 ng/mL

## 2018-06-30 LAB — LACTIC ACID, PLASMA: Lactic Acid, Venous: 2.9 mmol/L (ref 0.5–1.9)

## 2018-06-30 MED ORDER — VANCOMYCIN HCL 500 MG IV SOLR
500.0000 mg | Freq: Two times a day (BID) | INTRAVENOUS | Status: DC
  Administered 2018-07-01: 500 mg via INTRAVENOUS
  Filled 2018-06-30 (×3): qty 500

## 2018-06-30 MED ORDER — METHYLPREDNISOLONE SODIUM SUCC 125 MG IJ SOLR
60.0000 mg | INTRAMUSCULAR | Status: DC
  Administered 2018-07-01 (×2): 60 mg via INTRAVENOUS
  Filled 2018-06-30 (×2): qty 2

## 2018-06-30 MED ORDER — SODIUM CHLORIDE 0.9 % IV SOLN
INTRAVENOUS | Status: DC
  Administered 2018-07-01: 04:00:00 via INTRAVENOUS

## 2018-06-30 MED ORDER — ENOXAPARIN SODIUM 40 MG/0.4ML ~~LOC~~ SOLN
40.0000 mg | SUBCUTANEOUS | Status: DC
  Administered 2018-07-01 (×2): 40 mg via SUBCUTANEOUS
  Filled 2018-06-30 (×2): qty 0.4

## 2018-06-30 MED ORDER — LACTATED RINGERS IV BOLUS
1000.0000 mL | Freq: Once | INTRAVENOUS | Status: AC
  Administered 2018-06-30: 1000 mL via INTRAVENOUS

## 2018-06-30 MED ORDER — ACETAMINOPHEN 325 MG PO TABS
650.0000 mg | ORAL_TABLET | Freq: Once | ORAL | Status: AC
  Administered 2018-06-30: 650 mg via ORAL
  Filled 2018-06-30: qty 2

## 2018-06-30 MED ORDER — IPRATROPIUM BROMIDE 0.02 % IN SOLN: 0.5000 mg | RESPIRATORY_TRACT | Status: DC | PRN

## 2018-06-30 MED ORDER — METRONIDAZOLE IN NACL 5-0.79 MG/ML-% IV SOLN
500.0000 mg | Freq: Three times a day (TID) | INTRAVENOUS | Status: DC
  Administered 2018-06-30 – 2018-07-02 (×6): 500 mg via INTRAVENOUS
  Filled 2018-06-30 (×6): qty 100

## 2018-06-30 MED ORDER — VANCOMYCIN HCL IN DEXTROSE 1-5 GM/200ML-% IV SOLN
1000.0000 mg | Freq: Once | INTRAVENOUS | Status: AC
  Administered 2018-06-30: 1000 mg via INTRAVENOUS
  Filled 2018-06-30: qty 200

## 2018-06-30 MED ORDER — SODIUM CHLORIDE 0.9 % IV SOLN
2.0000 g | INTRAVENOUS | Status: DC
  Administered 2018-07-01: 2 g via INTRAVENOUS
  Filled 2018-06-30 (×2): qty 2

## 2018-06-30 MED ORDER — SODIUM CHLORIDE 0.9 % IV SOLN
2.0000 g | Freq: Once | INTRAVENOUS | Status: AC
  Administered 2018-06-30: 2 g via INTRAVENOUS
  Filled 2018-06-30: qty 2

## 2018-06-30 NOTE — H&P (Addendum)
History and Physical    Sabrina [name] ZOX:[id number] DOB: 1932-07-25 DOA: 06/30/2018  PCP: Sabrina Gammon, MD  Patient coming from: [facility]; NOK: Son, [phone]  Chief Complaint: Cough  HPI: Sabrina [name] is a 84 y.o. female with medical history significant of CVA; HTN; DM; and dementia presenting with cough. She has dementia and is unable to provide history.  She is able to report that she does not have pain.  She is oriented to self only.  HPI per Dr. Clayborne Dana:  Sabrina [name] is a 84 y.o. female [facility] is not able to offer any history at this time. From the notes it appears that the patient was started on Augmentin yesterday because she was aspirating food and coughing a lot. They did a chest x-ray which was negative. Apparently I think she is more sleepy than normal with increased heart rate and her oxygen was lower than normal So the started on oxygen for breathing however. Once again patient is not able to communicate at all.  No other associated or modifying symptoms.    ED Course:  PNA with sepsis.  CXR is less convincing, but history is more significant.  Concern for aspiration for a few days.  Today less talkative than usual.  +cough, hypoxia, high heart rate.  Review of Systems:  Unable to perform   PMH, PSH, SH, and FH reviewed in Epic  Past Medical History:  Diagnosis Date  . Asthma   . Dementia   . Diabetes mellitus   . H. pylori infection MAR 2014  . Hypertension   . Stroke [facility])     Past Surgical History:  Procedure Laterality Date  . CHOLECYSTECTOMY    . COLECTOMY     secondary to diverticulitis  . ESOPHAGOGASTRODUODENOSCOPY (EGD) WITH ESOPHAGEAL DILATION N/A 01/07/2013   FAO:[id number] ring was found at the gastroesophageal junction/SINGLE DUODENAL DIVERTICULUM    Social History   Socioeconomic History  . Marital status: Married    Spouse name: Not on file  . Number of children: Not on file  . Years of education: Not on file  . Highest  education level: Not on file  Occupational History  . Not on file  Social Needs  . Financial resource strain: Not on file  . Food insecurity:    Worry: Not on file    Inability: Not on file  . Transportation needs:    Medical: Not on file    Non-medical: Not on file  Tobacco Use  . Smoking status: Never Smoker  . Smokeless tobacco: Never Used  Substance and Sexual Activity  . Alcohol use: No  . Drug use: No  . Sexual activity: Never  Lifestyle  . Physical activity:    Days per week: Not on file    Minutes per session: Not on file  . Stress: Not on file  Relationships  . Social connections:    Talks on phone: Not on file    Gets together: Not on file    Attends religious service: Not on file    Active member of club or organization: Not on file    Attends meetings of clubs or organizations: Not on file    Relationship status: Not on file  . Intimate partner violence:    Fear of current or ex partner: Not on file    Emotionally abused: Not on file    Physically abused: Not on file    Forced sexual activity: Not on file  Other Topics  Concern  . Not on file  Social History Narrative  . Not on file    No Known Allergies  Family History  Problem Relation Age of Onset  . Colon cancer Neg Hx     Prior to Admission medications   Medication Sig Start Date End Date Taking? Authorizing Provider  acetaminophen (TYLENOL) 325 MG tablet Take 650 mg by mouth every 6 (six) hours as needed.    [provider]  Amino Acids-Protein Hydrolys (FEEDING SUPPLEMENT, PRO-STAT SUGAR FREE 64,) LIQD Take 30 mLs by mouth 2 (two) times daily between meals.    [provider]  Despina Hidden Oil [facility]) OINT Apply to sacrum and bilateral buttocks q shift & prn every shift    [provider]  Calcium Carbonate-Vitamin D (CALCIUM 500/VITAMIN D PO) Take 500 mg by mouth 2 (two) times daily.    [provider]  citalopram (CELEXA) 20 MG tablet Take 20 mg by  mouth daily.     [provider]  denosumab (PROLIA) 60 MG/ML SOLN injection Inject 60 mg into the skin every 6 (six) months. Administer in upper arm, thigh, or abdomen    [provider]  Ferrous Sulfate (IRON) 325 (65 Fe) MG TABS Take 1 tablet by mouth twice a day    [provider]  ipratropium (ATROVENT) 0.02 % nebulizer solution Take 0.5 mg by nebulization every 4 (four) hours as needed for wheezing or shortness of breath.    [provider]  loratadine (CLARITIN) 10 MG tablet Take 10 mg by mouth daily.    [provider]  memantine (NAMENDA) 10 MG tablet Take 10 mg by mouth 2 (two) times daily.    [provider]  Menthol, Topical Analgesic, (BIOFREEZE) 4 % GEL Apply prn to knees for pain management as needed daily    [provider]  mirtazapine (REMERON) 15 MG tablet Take 15 mg by mouth at bedtime.    [provider]  omeprazole (PRILOSEC) 40 MG capsule Take 40 mg by mouth 2 (two) times daily.     [provider]  ondansetron (ZOFRAN) 4 MG tablet Give 4 mg by mouth four times a day    [provider]    Physical Exam: Vitals:   06/30/18 2000 06/30/18 2030 06/30/18 2100 06/30/18 2117  BP: 100/64 112/75 (!) 100/47   Pulse: 92 93 86 86  Resp: (!) 21 19 19 18   Temp:    98.6 F (37 C)  TempSrc:    Oral  SpO2: 95% 100% 98% 98%  Weight:         General:  Appears calm and comfortable and is NAD, on  O2 Eyes:  PERRL, EOMI, normal lids, iris ENT:  grossly normal hearing, lips & tongue, mmm Neck:  no LAD, masses or thyromegaly Cardiovascular:  RRR, no m/r/g. No LE edema.  Respiratory:   CTA bilaterally with no wheezes/rales/rhonchi.  Normal respiratory effort. Abdomen:  soft, NT, ND, NABS Skin:  no rash or induration seen on limited exam Musculoskeletal:  grossly normal tone BUE/BLE, good ROM, no bony abnormality Psychiatric:  Oriented to person, able to answer very limited  questions Neurologic: unable to perform    Radiological Exams on Admission: Dg Chest Portable 1 View  Result Date: 06/30/2018 CLINICAL DATA:  Sepsis.  Recent aspiration and coughing yesterday. EXAM: PORTABLE CHEST 1 VIEW COMPARISON:  11/13/2016 FINDINGS: Patient is rotated to the right. Lungs are adequately inflated with hazy opacification over the left base/retrocardiac region.  This may be partially due to patient positioning. Cardiomediastinal silhouette and remainder of the exam is unchanged. IMPRESSION: Hazy opacification over the left base/retrocardiac region which may be due to effusion with atelectasis versus infection. Electronically Signed   By: Elberta Fortisaniel  Boyle M.D.   On: 06/30/2018 19:24    EKG: Independently reviewed.  Sinus tachycardia with rate 104; nonspecific ST changes with no evidence of acute ischemia   Labs on Admission: I have personally reviewed the available labs and imaging studies at the time of the admission.  Pertinent labs:   Glucose 233 Lactate 3.05 WBC 12.7 Hgb 11.5 - stable UA: moderate LE, 150 glucose, 30 protein, >50 WBC A1c 5.2 on 8/15   Assessment/Plan Principal Problem:   Sepsis due to pneumonia [facility](HCC) Active Problems:   Dementia   Hypertension   Anemia   Hyperglycemia   Sepsis due to PNA -SIRS criteria in this patient includes: Leukocytosis, fever, tachycardia, tachypnea  -Patient has evidence of acute organ failure with elevated lactate and borderline hypotension -While awaiting blood cultures, this appears to be a preseptic condition. -Sepsis protocol initiated -Patient had initial lactate >4 or SBP <90/MAP <65 and so has received the 30 cc/kg IVF bolus. -Given recent concern for aspiration (she was started on Augmentin at [facility] for this issue), the concern is that this is aspiration PNA/HCAP.  -Pneumonia Severity Index (PSI) is Class 5, 27% mortality. -Corticosteroids have been to shown to low overall mortality rate; risk of ARDS;  and need for mechanical ventilation.  This is particularly true in severe PNA (class 3+ PSI).  Will add 60 mg IV Solumedrol daily. -The patient has the following criteria for MDR (multi-drug resistance):  SNF placement -The patient will need treatment for HCAP due to MDR risk factors as above; will treat with Cefepime, Flagyl, and Vancomycin. -Additional complicating factors include: hypoxia/hypoxemia; failure of outpatient antibiotics. -NS @ 75cc/hr -Fever control -Repeat CBC in am -Sputum cultures -Blood cultures -Strep pneumo testing -Will order sepsis procalcitonin level.  Antibiotics would not be indicated for PCT <0.1 and probably should not be used for < 0.25.  >0.5 indicates infection and >>0.5 indicates more serious disease.  As the procalcitonin level normalizes, it will be reasonable to consider de-escalation of antibiotic coverage. -albuterol PRN -Blood and urine cultures pending -Will admit with telemetry and continue to monitor -Will trend lactate to ensure improvement -Given concern about aspiration, will hold all PO meds/intake until swallow evaluation tomorrow  Dementia -She apparently at baseline is able to interact and is talkative -At this time, she is quite stoic and somnolent and is not interactive -She is able to minimally answer questions but is oriented to self  HTN -She does not appear to be taking medications for this issue at this time  Anemia -She has a h/o ABLA with heme positive stools -Her son reports that prior colonoscopy caused complications and she ended up with a hemicolectomy (although the chart reports that this was due to diverticulitis) -He has opted for no more evaluation of this issue -Her hemoglobin does appear to be stable at this time  Hyperglycemia -Recent A1c indicates that the patient does not have DM -She is unlikely to suffer long- or short-term consequences associated with hyperglycemia at this time  -As a result, will not order  accuchecks and SSI    DVT prophylaxis:  Lovenox  Code Status:  Limited - confirmed with family.  According to the son, she would want "everything done" except chest compressions. Family Communication:  I spoke with her son by telephone; he was not aware that she was at the ER  Disposition Plan:  Back to SNF once clinically improved Consults called: ST Admission status: Admit - It is my clinical opinion that admission to INPATIENT is reasonable and necessary because of the expectation that this patient will require hospital care that crosses at least 2 midnights to treat this condition based on the medical complexity of the problems presented.  Given the aforementioned information, the predictability of an adverse outcome is felt to be significant.    Jonah Blue MD [facility]  If note is complete, please contact covering daytime or nighttime physician. [email or web address] Password [email or web address]  06/30/2018, 9:40 PM

## 2018-06-30 NOTE — ED Notes (Signed)
CRITICAL VALUE ALERT  Critical Value: Lactic Acid 3.05 Date & Time Notied:  06/30/18 @ 1926 Provider Notified: [place] Orders Received/Actions taken: None yet

## 2018-06-30 NOTE — ED Provider Notes (Signed)
Emergency Department Provider Note   I have reviewed the triage vital signs and the nursing notes.   HISTORY  Chief Complaint Cough   HPI Sabrina [name] is a 84 y.o. female [facility] is not able to offer any history at this time.  From the notes it appears that the patient was started on Augmentin yesterday because she was aspirating food and coughing a lot.  They did a chest x-ray which was negative.  Apparently I think she is more sleepy than normal with increased heart rate and her oxygen was lower than normal So the started on oxygen for breathing however.  Once again patient is not able to communicate at all. No other associated or modifying symptoms.   Level V Caveat Likely Secondary to Dementia  Past Medical History:  Diagnosis Date  . Asthma   . Dementia   . Diabetes mellitus   . H. pylori infection MAR 2014  . Hypertension   . Stroke [facility])     Patient Active Problem List   Diagnosis Date Noted  . Anemia 05/21/2018  . Cough 04/17/2018  . Pneumonia 12/27/2016  . Osteoporosis 11/21/2016  . Hypertension 09/26/2016  . Esophageal dysmotility 07/25/2016  . Depression 07/25/2016  . Dementia 07/06/2016  . Internal hemorrhoids with other complication 07/16/2013  . Rotator cuff syndrome of right shoulder 06/10/2013  . Fracture, humerus, proximal 04/08/2013  . Postural imbalance 02/19/2013  . Nausea with vomiting 01/06/2013  . Femur fracture (HCC) 04/07/2012    Past Surgical History:  Procedure Laterality Date  . CHOLECYSTECTOMY    . COLECTOMY     secondary to diverticulitis  . ESOPHAGOGASTRODUODENOSCOPY (EGD) WITH ESOPHAGEAL DILATION N/A 01/07/2013   [id number] ring was found at the gastroesophageal junction/SINGLE DUODENAL DIVERTICULUM    Current Outpatient Rx  . Order #: [id number] Class: Historical Med  . Order #: [id number] Class: Historical Med  . Order #: [id number] Class: Historical Med  . Order #: [id number] Class: Historical Med  . Order #:  [id number] Class: Historical Med  . Order #: [id number] Class: Historical Med  . Order #: [id number] Class: Historical Med  . Order #: [id number] Class: Historical Med  . Order #: [id number] Class: Historical Med  . Order #: [id number] Class: Historical Med  . Order #: [id number] Class: Historical Med  . Order #: [id number] Class: Historical Med  . Order #: [id number] Class: Historical Med  . Order #: [id number] Class: Historical Med    Allergies Patient has no known allergies.  Family History  Problem Relation Age of Onset  . Colon cancer Neg Hx     Social History Social History   Tobacco Use  . Smoking status: Never Smoker  . Smokeless tobacco: Never Used  Substance Use Topics  . Alcohol use: No  . Drug use: No    Review of Systems  Level V Caveat Likely Secondary to Dementia ____________________________________________   PHYSICAL EXAM:  VITAL SIGNS: ED Triage Vitals  Enc Vitals Group     BP 06/30/18 1752 (!) 135/59     Pulse Rate 06/30/18 1752 (!) 104     Resp 06/30/18 1752 20     Temp 06/30/18 1752 98.4 F (36.9 C)     Temp Source 06/30/18 1752 Oral     SpO2 06/30/18 1752 90 %     Weight 06/30/18 1750 125 lb (56.7 kg)    Constitutional: Alert and nonverbal. Well appearing and in no acute distress. Eyes: Conjunctivae are normal. PERRL. EOMI. Head: Atraumatic. Nose: No congestion/rhinnorhea. Mouth/Throat: Mucous membranes are dry.  Oropharynx non-erythematous. Neck: No  stridor.  No meningeal signs.   Cardiovascular: tachycardic rate, regular rhythm. Good peripheral circulation. Grossly normal heart sounds.   Respiratory: Tachypneic respiratory effort.  No retractions. Lungs crackles bilateral bases.  (exam limited by poor effort). Gastrointestinal: Soft and nontender. No distention.  Musculoskeletal: No lower extremity tenderness nor edema. No gross deformities of extremities. Neurologic:  No speech or language. No gross focal neurologic deficits are appreciated.  Skin:   Skin is warm, dry and intact. No rash noted.  ____________________________________________   LABS (all labs ordered are listed, but only abnormal results are displayed)  Labs Reviewed  COMPREHENSIVE METABOLIC PANEL - Abnormal; Notable for the following components:      Result Value   Potassium 3.4 (*)    Glucose, Bld 233 (*)    Calcium 8.4 (*)    Albumin 3.2 (*)    All other components within normal limits  CBC WITH DIFFERENTIAL/PLATELET - Abnormal; Notable for the following components:   WBC 12.7 (*)    Hemoglobin 11.5 (*)    HCT 35.9 (*)    RDW 19.7 (*)    Neutro Abs 11.4 (*)    All other components within normal limits  URINALYSIS, ROUTINE W REFLEX MICROSCOPIC - Abnormal; Notable for the following components:   APPearance CLOUDY (*)    Glucose, UA 150 (*)    Protein, ur 30 (*)    Leukocytes, UA MODERATE (*)    WBC, UA >50 (*)    All other components within normal limits  I-STAT CG4 LACTIC ACID, ED - Abnormal; Notable for the following components:   Lactic Acid, Venous 3.05 (*)    All other components within normal limits  CULTURE, BLOOD (ROUTINE X 2)  CULTURE, BLOOD (ROUTINE X 2)  URINE CULTURE  INFLUENZA PANEL BY PCR (TYPE A & B)  I-STAT CG4 LACTIC ACID, ED   ____________________________________________  EKG   EKG Interpretation  Date/Time:  Sunday June 30 2018 17:44:17 EDT Ventricular Rate:  104 PR Interval:    QRS Duration: 104 QT Interval:  361 QTC Calculation: 475 R Axis:   -93 Text Interpretation:  Sinus tachycardia Borderline prolonged PR interval Incomplete RBBB and LAFB Anterior infarct, old Borderline ST depression, lateral leads aside from rate no significant change since previous Confirmed by Marily MemosMesner, Blia Totman [phone]) on 06/30/2018 8:04:16 PM       ____________________________________________  RADIOLOGY  Dg Chest Portable 1 View  Result Date: 06/30/2018 CLINICAL DATA:  Sepsis.  Recent aspiration and coughing yesterday. EXAM: PORTABLE CHEST  1 VIEW COMPARISON:  11/13/2016 FINDINGS: Patient is rotated to the right. Lungs are adequately inflated with hazy opacification over the left base/retrocardiac region. This may be partially due to patient positioning. Cardiomediastinal silhouette and remainder of the exam is unchanged. IMPRESSION: Hazy opacification over the left base/retrocardiac region which may be due to effusion with atelectasis versus infection. Electronically Signed   By: Elberta Fortisaniel  Boyle M.D.   On: 06/30/2018 19:24    ____________________________________________   PROCEDURES  Procedure(s) performed:   Procedures  CRITICAL CARE Performed by: Marily MemosJason Makynli Stills Total critical care time: 35 minutes Critical care time was exclusive of separately billable procedures and treating other patients. Critical care was necessary to treat or prevent imminent or life-threatening deterioration. Critical care was time spent personally by me on the following activities: development of treatment plan with patient and/or surrogate as well as nursing, discussions with consultants, evaluation of patient's response to treatment, examination of patient, obtaining history from patient or surrogate, ordering and performing treatments  and interventions, ordering and review of laboratory studies, ordering and review of radiographic studies, pulse oximetry and re-evaluation of patient's condition.  ____________________________________________   INITIAL IMPRESSION / ASSESSMENT AND PLAN / ED COURSE  Sepsis likely. Respiratory cause? Will eval for same.   Chest x-ray with possible hazy opacity and is consistent with her tachypnea, hypoxia and fever as possible pneumonia so we will consider that however urine also shows greater than 50 white blood cells with leukocyte esterase.  No nitrite or bacteria.  Cultures of blood and urine sent.  Lactic acid elevated in a couple liters of fluids given consider cessation of sepsis.  Discussed with medicine for  admission. Attempted to contact son, LVM.      Pertinent labs & imaging results that were available during my care of the patient were reviewed by me and considered in my medical decision making (see chart for details).  ____________________________________________  FINAL CLINICAL IMPRESSION(S) / ED DIAGNOSES  Final diagnoses:  Sepsis, due to unspecified organism (HCC)  Cough  Acute respiratory failure with hypoxia (HCC)     MEDICATIONS GIVEN DURING THIS VISIT:  Medications  metroNIDAZOLE (FLAGYL) IVPB 500 mg (500 mg Intravenous New Bag/Given 06/30/18 2008)  lactated ringers bolus 1,000 mL (0 mLs Intravenous Hold 06/30/18 2026)  lactated ringers bolus 1,000 mL (1,000 mLs Intravenous New Bag/Given 06/30/18 1902)  ceFEPIme (MAXIPIME) 2 g in sodium chloride 0.9 % 100 mL IVPB (0 g Intravenous Stopped 06/30/18 2002)  vancomycin (VANCOCIN) IVPB 1000 mg/200 mL premix (1,000 mg Intravenous New Bag/Given 06/30/18 1927)  acetaminophen (TYLENOL) tablet 650 mg (650 mg Oral Given 06/30/18 2016)     NEW OUTPATIENT MEDICATIONS STARTED DURING THIS VISIT:  New Prescriptions   No medications on file    Note:  This note was prepared with assistance of Dragon voice recognition software. Occasional wrong-word or sound-a-like substitutions may have occurred due to the inherent limitations of voice recognition software.   Marily Memos, MD 06/30/18 2032

## 2018-06-30 NOTE — ED Notes (Signed)
Pt placed on 2L o2 via [place].

## 2018-06-30 NOTE — Progress Notes (Signed)
Pharmacy Antibiotic Note  Sabrina [name] is a 84 y.o. female admitted on 06/30/2018 with aspiration PNA/sepsis.  Pharmacy has been consulted for Vancomycin and cefepime dosing.  Plan: Vancomycin 1gm loading dose, then 500mg  IV every 12 hours.  Goal trough 15-20 mcg/mL.  Cefepime 2gm IV q24h F/U cxs and clinical progress Monitor V/S, labs, and levels as indicated  Weight: 125 lb (56.7 kg)  Temp (24hrs), Avg:100 F (37.8 C), Min:98.4 F (36.9 C), Max:102.9 F (39.4 C)  Recent Labs  Lab 06/28/18 0700 06/30/18 1855 06/30/18 1919  WBC 4.4 12.7*  --   CREATININE 0.71 0.83  --   LATICACIDVEN  --  2.9* 3.05*    Estimated Creatinine Clearance: 45.2 mL/min (by C-G formula based on SCr of 0.83 mg/dL).    No Known Allergies  Antimicrobials this admission: Vancomycin 9/15 >>  Cefepime 9/15 >>  Flagyl 9/15  Dose adjustments this admission: N/A  Microbiology results: 9/15 BCx: pending  MRSA PCR:   Thank you for allowing pharmacy to be a part of this patient's care.  Elder CyphersLorie Lyric Rossano, BS Loura Backharm D, [place]PS Clinical Pharmacist Pager [phone] 06/30/2018 10:26 PM

## 2018-06-30 NOTE — ED Notes (Signed)
Blood cultures have been drawn °

## 2018-06-30 NOTE — ED Triage Notes (Signed)
[facility] Started on augmentin yesterday after aspirating food and coughing several time. Chest x-ray was reported negative. Pt alert but not talking. Per [facility] pt lethargic, increased HR and decreased o2 sat

## 2018-07-01 ENCOUNTER — Inpatient Hospital Stay (HOSPITAL_COMMUNITY): Payer: Medicare Other

## 2018-07-01 DIAGNOSIS — J690 Pneumonitis due to inhalation of food and vomit: Secondary | ICD-10-CM

## 2018-07-01 DIAGNOSIS — E876 Hypokalemia: Secondary | ICD-10-CM

## 2018-07-01 LAB — CBC WITH DIFFERENTIAL/PLATELET
Basophils Absolute: 0 10*3/uL (ref 0.0–0.1)
Basophils Relative: 0 %
EOS ABS: 0 10*3/uL (ref 0.0–0.7)
Eosinophils Relative: 0 %
HEMATOCRIT: 34.7 % — AB (ref 36.0–46.0)
Hemoglobin: 10.9 g/dL — ABNORMAL LOW (ref 12.0–15.0)
LYMPHS ABS: 1.5 10*3/uL (ref 0.7–4.0)
LYMPHS PCT: 12 %
MCH: 29 pg (ref 26.0–34.0)
MCHC: 31.4 g/dL (ref 30.0–36.0)
MCV: 92.3 fL (ref 78.0–100.0)
MONOS PCT: 5 %
Monocytes Absolute: 0.6 10*3/uL (ref 0.1–1.0)
Neutro Abs: 10.3 10*3/uL — ABNORMAL HIGH (ref 1.7–7.7)
Neutrophils Relative %: 83 %
Platelets: 150 10*3/uL (ref 150–400)
RBC: 3.76 MIL/uL — ABNORMAL LOW (ref 3.87–5.11)
RDW: 19.5 % — ABNORMAL HIGH (ref 11.5–15.5)
WBC: 12.4 10*3/uL — ABNORMAL HIGH (ref 4.0–10.5)

## 2018-07-01 LAB — MRSA PCR SCREENING: MRSA by PCR: NEGATIVE

## 2018-07-01 LAB — PROCALCITONIN: PROCALCITONIN: 3.13 ng/mL

## 2018-07-01 LAB — LACTIC ACID, PLASMA: Lactic Acid, Venous: 1.5 mmol/L (ref 0.5–1.9)

## 2018-07-01 LAB — BASIC METABOLIC PANEL
Anion gap: 7 (ref 5–15)
BUN: 14 mg/dL (ref 8–23)
CHLORIDE: 106 mmol/L (ref 98–111)
CO2: 26 mmol/L (ref 22–32)
CREATININE: 0.69 mg/dL (ref 0.44–1.00)
Calcium: 7.9 mg/dL — ABNORMAL LOW (ref 8.9–10.3)
GFR calc non Af Amer: >60 mL/min (ref >60)
Glucose, Bld: 109 mg/dL — ABNORMAL HIGH (ref 70–99)
POTASSIUM: 3.3 mmol/L — AB (ref 3.5–5.1)
Sodium: 139 mmol/L (ref 135–145)

## 2018-07-01 MED ORDER — INFLUENZA VAC SPLIT HIGH-DOSE 0.5 ML IM SUSY
0.5000 mL | PREFILLED_SYRINGE | INTRAMUSCULAR | Status: AC
  Administered 2018-07-02: 0.5 mL via INTRAMUSCULAR
  Filled 2018-07-01: qty 0.5

## 2018-07-01 MED ORDER — VANCOMYCIN HCL 500 MG IV SOLR
INTRAVENOUS | Status: AC
  Filled 2018-07-01: qty 500

## 2018-07-01 MED ORDER — SODIUM CHLORIDE 0.9 % IV SOLN
INTRAVENOUS | Status: DC
  Administered 2018-07-01 – 2018-07-02 (×2): via INTRAVENOUS
  Filled 2018-07-01 (×4): qty 1000

## 2018-07-01 NOTE — Progress Notes (Signed)
Modified Barium Swallow Progress Note  Patient Details  Name: Sabrina [name] Prewitt MRN: [id number] Date of Birth: 1932/04/27  Today's Date: 07/01/2018  Modified Barium Swallow completed.  Full report located under Chart Review in the Imaging Section.  Brief recommendations include the following:  Clinical Impression  Pt presents with mild/mod oral phase and moderate pharyngeal phase dysphagia characterized by delayed oral transit, weak lingual manipulation, premature spillage of all liquids to the pyriforms, delay in swallow initiation, decreased timing/relaxation of UES, reduced hyolaryngeal excursion, epiglottic deflection and tongue base retraction, and decreased laryngeal closure resulting in delay in swallow initiation, penetration/aspiration usually silent in trace to mod amounts with thin and nectars, and significant vallecular and pyriform residue after the swallow increasing risk for aspiration after the swallow. Chin tuck was attempted and found to be ineffective. Pt will be at risk for aspiration across all liquids due to delay in initiation, poor sensation, and significant residuals post swallow. Pt appears safest (while still at risk for aspiration and dehydration) with D2 and honey-thick liquids (HTL) with cues for small sips, swallow 2x, and clear throat/cough periodically. Pt with known esophageal phase dysphagia from UGI in 2017 and this was also observed today during esophageal sweep (incomplete barium clearance throughout the esophagus). Recommend D2 with option to downgrade to D1 per treating SLP at SNF due to esophageal phase dysphagia and HTL. Also recommend reassessment when treating SLP deems clinically appropriate to see if Pt can move back to thin or even NTL. Consider palliative care consult. SLP will follow during acute stay.    Swallow Evaluation Recommendations       SLP Diet Recommendations: Dysphagia 2 (Fine chop) solids;Honey thick liquids   Liquid Administration via:  Cup;No straw   Medication Administration: Whole meds with liquid   Supervision: Patient able to self feed;Full supervision/cueing for compensatory strategies   Compensations: Slow rate;Small sips/bites;Multiple dry swallows after each bite/sip;Clear throat intermittently   Postural Changes: Remain semi-upright after after feeds/meals (Comment);Seated upright at 90 degrees   Oral Care Recommendations: Oral care BID;Staff/trained caregiver to provide oral care   Other Recommendations: Order thickener from pharmacy;Clarify dietary restrictions  Thank you,  Havery MorosDabney Porter, CCC-SLP [phone]  PORTER,DABNEY 07/01/2018,4:43 PM

## 2018-07-01 NOTE — Progress Notes (Signed)
PROGRESS NOTE                                                                                                                                                                                                             Patient Demographics:    Sabrina [name], is a 84 y.o. female, DOB - Jun 13, 1932, WGN:[id number]  Admit date - 06/30/2018   Admitting Physician Jonah Blue, MD  Outpatient Primary MD for the patient is [name], MD  LOS - 1  Outpatient Specialists: None  Chief Complaint  Patient presents with  . Cough       Brief Narrative 84 year old nursing home resident female with history of CVA, hypertension, diabetes mellitus, advanced dementia, [facility] nursing home sent to the ED with cough.  Patient apparently was started on Augmentin on the day prior to admission with concern for aspiration and increasing cough.  Chest x-ray done at the facility was negative.  She was found to be more sleepy than usual with O2 desaturation and tachycardia. In the ED patient was septic with hypoxia, tachycardia and fever.  Ptosis and lactic acid greater than 4.  Chest x-ray showed hazy opacification over the left base area possible for infection.  Patient given IV fluid bolus and empirically started on vancomycin, cefepime and Flagyl.  Admitted to hospitalist service.   Subjective:  Patient oriented to place only and per son this is her baseline.  Continues to have cough.   Assessment  & Plan :    Principal Problem:   Sepsis due to health care associated /aspiration pneumonia [facility]) Empiric vancomycin, cefepime and Flagyl.  Follow blood culture, urine strep antigen and Legionella antigen.  Lactic acid normalized.  Flu PCR negative.  UA also suggestive of UTI.  Follow culture. Keep n.p.o. with high aspiration risk.  Per son she is on a pured diet at the facility.  SLP evaluation.   Active Problems:   Dementia,  severe As per son she is at her baseline.  She is currently oriented to place only.  History of acute blood loss anemia Hemoglobin currently stable.  No work-up planned.   Essential hypertension Currently stable.  Not on medication  Hypokalemia Replenished with IV K in fluids  Code Status : Partial (no CPR)  Family Communication  : Son at bedside  Disposition Plan  :  Return to SNF once improved  Barriers For Discharge : Active symptoms  Consults  : None  Procedures  : None  DVT Prophylaxis  :  Lovenox -   Lab Results  Component Value Date   PLT 150 07/01/2018    Antibiotics  :   Anti-infectives (From admission, onward)   Start     Dose/Rate Route Frequency Ordered Stop   07/01/18 1800  ceFEPIme (MAXIPIME) 2 g in sodium chloride 0.9 % 100 mL IVPB     2 g 200 mL/hr over 30 Minutes Intravenous Every 24 hours 06/30/18 2233     07/01/18 0600  vancomycin (VANCOCIN) 500 mg in sodium chloride 0.9 % 100 mL IVPB     500 mg 100 mL/hr over 60 Minutes Intravenous Every 12 hours 06/30/18 2233     06/30/18 1900  ceFEPIme (MAXIPIME) 2 g in sodium chloride 0.9 % 100 mL IVPB     2 g 200 mL/hr over 30 Minutes Intravenous  Once 06/30/18 1852 06/30/18 2002   06/30/18 1900  metroNIDAZOLE (FLAGYL) IVPB 500 mg     500 mg 100 mL/hr over 60 Minutes Intravenous Every 8 hours 06/30/18 1852     06/30/18 1900  vancomycin (VANCOCIN) IVPB 1000 mg/200 mL premix     1,000 mg 200 mL/hr over 60 Minutes Intravenous  Once 06/30/18 1852 06/30/18 2034        Objective:   Vitals:   06/30/18 2240 06/30/18 2300 07/01/18 0002 07/01/18 0645  BP: (!) 99/46 (!) 103/50 (!) 104/50 135/64  Pulse: 85 86 (!) 55 81  Resp: 20 16 20 20   Temp:   97.8 F (36.6 C) 98.2 F (36.8 C)  TempSrc:   Oral Oral  SpO2: 94% 96% 99% 99%  Weight:   58.2 kg   Height:   5\' 5"  (1.651 m)     Wt Readings from Last 3 Encounters:  07/01/18 58.2 kg  05/21/18 56.7 kg  02/28/18 57 kg     Intake/Output Summary (Last 24  hours) at 07/01/2018 1610 Last data filed at 06/30/2018 2149 Gross per 24 hour  Intake 2995.75 ml  Output -  Net 2995.75 ml     Physical Exam  Gen: not in distress HEENT: Pallor present, moist mucosa, supple neck Chest: Diminished breath sounds over lung bases CVS: N S1&S2, no murmurs,  GI: soft, NT, ND, BS+ Musculoskeletal: warm, no edema CNS :Oriented x1    Data Review:    CBC Recent Labs  Lab 06/28/18 0700 06/30/18 1855 07/01/18 0535  WBC 4.4 12.7* 12.4*  HGB 11.5* 11.5* 10.9*  HCT 36.7 35.9* 34.7*  PLT 215 188 150  MCV 90.8 90.9 92.3  MCH 28.5 29.1 29.0  MCHC 31.3 32.0 31.4  RDW 19.0* 19.7* 19.5*  LYMPHSABS 1.4 0.7 1.5  MONOABS 0.7 0.6 0.6  EOSABS 0.1 0.0 0.0  BASOSABS 0.0 0.0 0.0    Chemistries  Recent Labs  Lab 06/28/18 0700 06/30/18 1855 07/01/18 0535  NA 142 138 139  K 3.6 3.4* 3.3*  CL 109 104 106  CO2 26 24 26   GLUCOSE 88 233* 109*  BUN 12 18 14   CREATININE 0.71 0.83 0.69  CALCIUM 8.3* 8.4* 7.9*  AST  --  23  --   ALT  --  11  --   ALKPHOS  --  53  --   BILITOT  --  0.6  --    ------------------------------------------------------------------------------------------------------------------ No results for input(s): CHOL, HDL, LDLCALC, TRIG, CHOLHDL, LDLDIRECT in the last  72 hours.  Lab Results  Component Value Date   HGBA1C 5.2 05/30/2018   ------------------------------------------------------------------------------------------------------------------ No results for input(s): TSH, T4TOTAL, T3FREE, THYROIDAB in the last 72 hours.  Invalid input(s): FREET3 ------------------------------------------------------------------------------------------------------------------ No results for input(s): VITAMINB12, FOLATE, FERRITIN, TIBC, IRON, RETICCTPCT in the last 72 hours.  Coagulation profile No results for input(s): INR, PROTIME in the last 168 hours.  No results for input(s): DDIMER in the last 72 hours.  Cardiac Enzymes No results  for input(s): CKMB, TROPONINI, MYOGLOBIN in the last 168 hours.  Invalid input(s): CK ------------------------------------------------------------------------------------------------------------------    Component Value Date/Time   BNP 154.0 (H) 04/23/2017 0300    Inpatient Medications  Scheduled Meds: . enoxaparin (LOVENOX) injection  40 mg Subcutaneous Q24H  . [START ON 06/19/2018] Influenza vac split quadrivalent PF  0.5 mL Intramuscular Tomorrow-1000  . methylPREDNISolone (SOLU-MEDROL) injection  60 mg Intravenous Q24H   Continuous Infusions: . sodium chloride 75 mL/hr at 07/01/18 0351  . ceFEPime (MAXIPIME) IV    . metronidazole 500 mg (07/01/18 0355)  . vancomycin 500 mg (07/01/18 16100648)   PRN Meds:.ipratropium  Micro Results Recent Results (from the past 240 hour(s))  Blood Culture (routine x 2)     Status: None (Preliminary result)   Collection Time: 06/30/18  6:55 PM  Result Value Ref Range Status   Specimen Description RIGHT ANTECUBITAL  Final   Special Requests   Final    BOTTLES DRAWN AEROBIC AND ANAEROBIC Blood Culture adequate volume   Culture   Final    NO GROWTH < 12 HOURS Performed at [facility], [address] [place], [place] [address]    Report Status PENDING  Incomplete  Blood Culture (routine x 2)     Status: None (Preliminary result)   Collection Time: 06/30/18  6:56 PM  Result Value Ref Range Status   Specimen Description BLOOD LEFT ARM  Final   Special Requests   Final    BOTTLES DRAWN AEROBIC AND ANAEROBIC Blood Culture adequate volume   Culture   Final    NO GROWTH < 12 HOURS Performed at [facility], [address] [place], [place] [address]    Report Status PENDING  Incomplete    Radiology Reports Dg Chest Portable 1 View  Result Date: 06/30/2018 CLINICAL DATA:  Sepsis.  Recent aspiration and coughing yesterday. EXAM: PORTABLE CHEST 1 VIEW COMPARISON:  11/13/2016 FINDINGS: Patient is rotated to the right. Lungs are adequately inflated  with hazy opacification over the left base/retrocardiac region. This may be partially due to patient positioning. Cardiomediastinal silhouette and remainder of the exam is unchanged. IMPRESSION: Hazy opacification over the left base/retrocardiac region which may be due to effusion with atelectasis versus infection. Electronically Signed   By: Elberta Fortisaniel  Boyle M.D.   On: 06/30/2018 19:24    Time Spent in minutes  35   Coulton Schlink M.D on 07/01/2018 at 9:38 AM  Between 7am to 7pm - Pager - [phone]  After 7pm go to [email or web address] - password [facility]  [facility] -  Office  [phone]

## 2018-07-01 NOTE — Evaluation (Signed)
Clinical/Bedside Swallow Evaluation Patient Details  Name: Sabrina [name] MRN: [id number] Date of Birth: [birth year]  Today's Date: 07/01/2018 Time: SLP Start Time (ACUTE ONLY): 1234 SLP Stop Time (ACUTE ONLY): 1300 SLP Time Calculation (min) (ACUTE ONLY): 26 min  Past Medical History:  Past Medical History:  Diagnosis Date  . Asthma   . Dementia   . Diabetes mellitus   . H. pylori infection MAR 2014  . Hypertension   . Stroke [facility](HCC)    Past Surgical History:  Past Surgical History:  Procedure Laterality Date  . CHOLECYSTECTOMY    . COLECTOMY     secondary to diverticulitis  . ESOPHAGOGASTRODUODENOSCOPY (EGD) WITH ESOPHAGEAL DILATION N/A 01/07/2013   WUJ:[id number]:Schatzki ring was found at the gastroesophageal junction/SINGLE DUODENAL DIVERTICULUM   HPI:  Sabrina Amassudrey K Garciais a 84 y.o.femalewith medical history significant ofCVA; HTN; DM; and dementia presenting with cough.She has dementia and is unable to provide history. She is able to report that she does not have pain. She is oriented to self only. [facility] Chest x-ray shows: Hazy opacification over the left base/retrocardiac region which may   Assessment / Plan / Recommendation Clinical Impression  Clinical swallow evaluation completed at bedside, no family present at this time. [facility] and has suspected aspiration PNA. Chart review reveals that Pt had dysphagia symptoms in 2017 and had UGI which showed esophageal dysmotility, which could be the reason [facility]. Oral motor examination grossly WFL with questionable mild left facial asymmetry. Pt presents with congested cough prior to po administration. She presented with audible swallow with thins via cup and immediate cough following straw sips thin. Delayed throat clearing exhibited following puree trials. Will go ahead and proceed with objective assessment given suspected aspiration  PNA, h/o esophageal dysphagia, and current signs of aspiration at bedside. OK for po meds whole in puree, but continue NPO until MBSS can be completed later this afternoon. Above discussed with Sabrina HymenBonnie, RN.   SLP Visit Diagnosis: Dysphagia, unspecified (R13.10)    Aspiration Risk  Mild aspiration risk    Diet Recommendation NPO except meds(pending MBSS later today)   Medication Administration: Whole meds with puree Postural Changes: Seated upright at 90 degrees;Remain upright for at least 30 minutes after po intake    Other  Recommendations Oral Care Recommendations: Oral care BID;Staff/trained caregiver to provide oral care   Follow up Recommendations 24 hour supervision/assistance      Frequency and Duration min 2x/week  1 week       Prognosis Prognosis for Safe Diet Advancement: Fair Barriers to Reach Goals: Cognitive deficits      Swallow Study   General Date of Onset: 06/30/18 HPI: Sabrina Amassudrey K Garciais a 84 y.o.femalewith medical history significant ofCVA; HTN; DM; and dementia presenting with cough.She has dementia and is unable to provide history. She is able to report that she does not have pain. She is oriented to self only. [facility] Chest x-ray shows: Hazy opacification over the left base/retrocardiac region which may Type of Study: Bedside Swallow Evaluation Previous Swallow Assessment: None on record; UGI 2017 with esoph dysmotility Diet Prior to this Study: NPO Temperature Spikes Noted: No Respiratory Status: Nasal cannula History of Recent Intubation: No Behavior/Cognition: Alert;Cooperative;Pleasant mood;Confused;Requires cueing Oral Cavity Assessment: Within Functional Limits Oral Care Completed by SLP: No Oral Cavity - Dentition: Adequate natural dentition Vision: Functional  for self-feeding Self-Feeding Abilities: Able to feed self;Needs set up Patient Positioning: Upright in bed Baseline Vocal  Quality: Normal(low pitch) Volitional Cough: Strong;Congested Volitional Swallow: Able to elicit    Oral/Motor/Sensory Function Overall Oral Motor/Sensory Function: Mild impairment Facial Symmetry: Abnormal symmetry left Lingual ROM: Within Functional Limits Lingual Symmetry: Within Functional Limits Lingual Strength: Within Functional Limits Lingual Sensation: Within Functional Limits Velum: Within Functional Limits Mandible: Within Functional Limits   Ice Chips Ice chips: Within functional limits Presentation: Spoon   Thin Liquid Thin Liquid: Impaired Presentation: Cup;Self Fed;Straw Pharyngeal  Phase Impairments: Cough - Immediate(immediate cough after straw sips; audible swallow)    Nectar Thick Nectar Thick Liquid: Not tested   Honey Thick Honey Thick Liquid: Not tested   Puree Puree: Within functional limits Presentation: Spoon   Solid     Solid: Not tested     Thank you,  Sabrina [name], CCC-SLP [phone]  Sabrina [name] 07/01/2018,1:02 PM

## 2018-07-02 ENCOUNTER — Inpatient Hospital Stay
Admission: RE | Admit: 2018-07-02 | Discharge: 2020-07-16 | Disposition: E | Payer: Medicare Other | Source: Ambulatory Visit | Attending: Internal Medicine | Admitting: Internal Medicine

## 2018-07-02 DIAGNOSIS — A419 Sepsis, unspecified organism: Secondary | ICD-10-CM

## 2018-07-02 DIAGNOSIS — J189 Pneumonia, unspecified organism: Secondary | ICD-10-CM

## 2018-07-02 DIAGNOSIS — J9601 Acute respiratory failure with hypoxia: Secondary | ICD-10-CM

## 2018-07-02 DIAGNOSIS — F0390 Unspecified dementia without behavioral disturbance: Secondary | ICD-10-CM

## 2018-07-02 LAB — BASIC METABOLIC PANEL
ANION GAP: 6 (ref 5–15)
BUN: 11 mg/dL (ref 8–23)
CHLORIDE: 112 mmol/L — AB (ref 98–111)
CO2: 24 mmol/L (ref 22–32)
Calcium: 7.5 mg/dL — ABNORMAL LOW (ref 8.9–10.3)
Creatinine, Ser: 0.65 mg/dL (ref 0.44–1.00)
GFR calc Af Amer: >60 mL/min (ref >60)
Glucose, Bld: 131 mg/dL — ABNORMAL HIGH (ref 70–99)
POTASSIUM: 3.6 mmol/L (ref 3.5–5.1)
Sodium: 142 mmol/L (ref 135–145)

## 2018-07-02 LAB — PROCALCITONIN: PROCALCITONIN: 2.19 ng/mL

## 2018-07-02 MED ORDER — PREDNISONE 20 MG PO TABS: 40.0000 mg | ORAL_TABLET | Freq: Every day | ORAL | 0 refills | Status: DC

## 2018-07-02 MED ORDER — ONDANSETRON HCL 4 MG/2ML IJ SOLN: 4.0000 mg | Freq: Four times a day (QID) | INTRAMUSCULAR | Status: DC | PRN

## 2018-07-02 MED ORDER — PREDNISONE 20 MG PO TABS
40.0000 mg | ORAL_TABLET | Freq: Every day | ORAL | Status: DC
  Administered 2018-07-02: 40 mg via ORAL
  Filled 2018-07-02: qty 2

## 2018-07-02 MED ORDER — ACETAMINOPHEN 325 MG PO TABS: 650.0000 mg | ORAL_TABLET | Freq: Four times a day (QID) | ORAL | Status: DC | PRN

## 2018-07-02 MED ORDER — SODIUM CHLORIDE 0.9 % IV SOLN
2.0000 g | Freq: Once | INTRAVENOUS | Status: AC
  Administered 2018-07-02: 2 g via INTRAVENOUS
  Filled 2018-07-02: qty 2

## 2018-07-02 MED ORDER — AMOXICILLIN-POT CLAVULANATE 875-125 MG PO TABS: 1.0000 | ORAL_TABLET | Freq: Two times a day (BID) | ORAL | Status: DC

## 2018-07-02 MED ORDER — AMOXICILLIN-POT CLAVULANATE 875-125 MG PO TABS: 1.0000 | ORAL_TABLET | Freq: Two times a day (BID) | ORAL | 0 refills | Status: DC

## 2018-07-02 NOTE — Clinical Social Work Note (Signed)
Facility notified of patient's discharge and discharge clinicals sent.  LCSW notified patient's son of discharge. He indicated that he had previously spoken to the attending who had advised of the discharge.   LCSW signing off.     Merland Holness, Juleen ChinaHeather D, LCSW

## 2018-07-02 NOTE — Progress Notes (Signed)
  Speech Language Pathology Treatment: Dysphagia  Patient Details Name: Enzo Biudrey K Bradsher MRN: [id number] DOB: 1932-08-05 Today's Date: 2018-09-15 Time: [id number] SLP Time Calculation (min) (ACUTE ONLY): 19 min  Assessment / Plan / Recommendation Clinical Impression  Pt seen at bedside for ongoing diagnostic dysphagia intervention following MBSS completed yesterday. Pt was placed on D2 and honey-thick liquids (HTL) with po medications presented whole in puree. Pt seen at bedside (no family present) and consumed HTL via self presented cup sips with encouragement for intake without overt signs or symptoms of aspiration. SLP explained reasoning for thickened liquids at this time and Pt appeared surprised to hear that the thin liquids were being aspirated at times. Recommend continuing diet as ordered with SLP f/u at receiving facility [facility] and consider repeat MBSS in 4-6 weeks pending clinical improvement or as treating SLP sees fit.    HPI HPI: Alanda Amassudrey K Garciais a 84 y.o.femalewith medical history significant ofCVA; HTN; DM; and dementia presenting with cough.She has dementia and is unable to provide history. She is able to report that she does not have pain. She is oriented to self only. [facility] Chest x-ray shows: Hazy opacification over the left base/retrocardiac region which may      SLP Plan  Continue with current plan of care       Recommendations  Diet recommendations: Dysphagia 2 (fine chop);Honey-thick liquid Liquids provided via: Cup Medication Administration: Whole meds with puree Supervision: Patient able to self feed;Full supervision/cueing for compensatory strategies Compensations: Slow rate;Small sips/bites;Multiple dry swallows after each bite/sip;Clear throat intermittently Postural Changes and/or Swallow Maneuvers: Seated upright 90 degrees;Upright 30-60 min after meal                Oral Care Recommendations:  Oral care BID;Staff/trained caregiver to provide oral care Follow up Recommendations: Skilled Nursing facility SLP Visit Diagnosis: Dysphagia, oropharyngeal phase (R13.12) Plan: Continue with current plan of care       Thank you,  Havery MorosDabney Porter, CCC-SLP [phone]                PORTER,DABNEY 2018-09-15, 3:15 PM

## 2018-07-02 NOTE — Clinical Social Work Note (Addendum)
Late entry for 07/01/18 Clinical Social Work Assessment  Patient Details  Name: Sabrina [name] MRN: [id number] Date of Birth: [birth year]  Date of referral:  06/23/2018               Reason for consult:  Facility Placement                Permission sought to share information with:    Permission granted to share information::     Name::        Agency::     Relationship::     Contact Information:     Housing/Transportation Living arrangements for the past 2 months:  [facility] Facility Source of Information:  Patient Patient Interpreter Needed:  None Criminal Activity/Legal Involvement Pertinent to Current Situation/Hospitalization:  No - Comment as needed Significant Relationships:  Adult Children Lives with:  Facility Resident Do you feel safe going back to the place where you live?  Yes Need for family participation in patient care:  Yes (Comment)  Care giving concerns:  None identified.    Social Worker assessment / plan:  [facility] She uses [facility], staff assists with ADLs and she is on chronic oxygen.  [facility]  [facility] confirmed patient can return and no FL2 is needed.  Employment status:  Retired Database administratornsurance information:  Managed Medicare PT Recommendations:  Not assessed at this time Information / Referral to community resources:     Patient/Family's Response to care:  [facility]   Patient/Family's Understanding of and Emotional Response to Diagnosis, Current Treatment, and Prognosis:  Patient understands her diagnosis, treatment and prognosis.  Emotional Assessment Appearance:  Appears stated age Attitude/Demeanor/Rapport:    Affect (typically observed):  Calm Orientation:  Oriented to Self, Oriented to Place, Oriented to  Time, Oriented to Situation Alcohol / Substance use:  Not Applicable Psych involvement (Current and /or in the community):  No  (Comment)  Discharge Needs  Concerns to be addressed:  Discharge Planning Concerns Readmission within the last 30 days:  No Current discharge risk:  None Barriers to Discharge:  No Barriers Identified   Annice NeedySettle, Gianne Shugars D, LCSW 06/20/2018, 9:26 AM

## 2018-07-02 NOTE — Discharge Summary (Signed)
Physician Discharge Summary  Sabrina [name] NWG:[id number] DOB: [birth year] DOA: 06/30/2018  PCP: [name], MD  Admit date: 06/30/2018 Discharge date: 07/10/18  Admitted From: [facility] Disposition:  [facility]  Recommendations for Outpatient Follow-up:  1. Follow up with PCP in 1-2 weeks 2. Please follow up on 06/30/18 urine culture results and adjust antibiotics accordingly    Discharge Condition: Stable CODE STATUS: no chest compression, but yes to everything else Diet recommendation: dysphagia 2 with honey thickened liquids   Brief/Interim Summary: 84 year old nursing home resident female with history of CVA, hypertension, diabetes mellitus, advanced dementia, [facility] nursing home sent to the ED with cough.  Patient apparently was started on Augmentin on the day prior to admission with concern for aspiration and increasing cough.  Chest x-ray done at the facility was negative.  She was found to be more sleepy than usual with O2 desaturation and tachycardia. In the ED patient was septic with hypoxia, tachycardia and fever.  Ptosis and lactic acid greater than 4.  Chest x-ray showed hazy opacification over the left base area possible for infection.  Patient given IV fluid bolus and empirically started on vancomycin, cefepime and Flagyl.  Admitted to hospitalist service.  She gradually improved clinically.  She was weaned off oxygen.  Her HR improved and she was no longer tachycardic.  She defervesced and remained hemodynamically stable.  She was seen by speech therapy whom recommended a dysphagia 2 diet with honey thickened liquids.  She will go back to [facility] with 5 more days of abx to complete 7 days of tx.  Discharge Diagnoses:  Sepsis -due to pneumonia -sepsis physiology improved -lactic acid peaked 3.05 -PCT peaked 3.13 -started on cefepime and metronidazole with improvement -saline lock IVF as po intake improved -flu-neg -blood cultures  neg  Lobar/Aspiration pneumonia -started on cefepime and metronidazole  -d/c back to SNF on amox/clav x 4 more days to complete 1 week of tx -dysphagia 2 diet with honey thickened liquids -prednisone x 3 more days after d/c to complete 5 days  Acute metabolic encephalopathy -presented with somnolence -improved, near baseline at time of d/c  Dementia -continue namenda -at baseline A&O x 2--person and place -continue celexa and remeron  Hypokalemia -repleted  Pyuria -please follow up on urine culture results and adjust antibiotics accordingly  Discharge Instructions   Allergies as of 07-10-2018   No Known Allergies     Medication List    STOP taking these medications   amoxicillin-clavulanate 500-125 MG tablet Commonly known as:  AUGMENTIN Replaced by:  amoxicillin-clavulanate 875-125 MG tablet     TAKE these medications   amoxicillin-clavulanate 875-125 MG tablet Commonly known as:  AUGMENTIN Take 1 tablet by mouth every 12 (twelve) hours. Replaces:  amoxicillin-clavulanate 500-125 MG tablet   BIOFREEZE 4 % Gel Generic drug:  Menthol (Topical Analgesic) Apply 1 application topically daily as needed (for knee pain management). Apply prn to knees for pain management as needed daily   CALCIUM 500/VITAMIN D PO Take 500 mg by mouth 2 (two) times daily.   citalopram 20 MG tablet Commonly known as:  CELEXA Take 20 mg by mouth daily.   denosumab 60 MG/ML Soln injection Commonly known as:  PROLIA Inject 60 mg into the skin every 6 (six) months. Administer in upper arm, thigh, or abdomen   ipratropium 0.02 % nebulizer solution Commonly known as:  ATROVENT Take 0.5 mg by nebulization every 4 (four) hours as needed for wheezing or shortness of breath.  Iron 325 (65 Fe) MG Tabs Take 325 mg by mouth 2 (two) times daily.   loratadine 10 MG tablet Commonly known as:  CLARITIN Take 10 mg by mouth daily.   memantine 10 MG tablet Commonly known as:  NAMENDA Take 10  mg by mouth 2 (two) times daily.   mirtazapine 15 MG tablet Commonly known as:  REMERON Take 15 mg by mouth at bedtime.   omeprazole 40 MG capsule Commonly known as:  PRILOSEC Take 40 mg by mouth 2 (two) times daily.   ondansetron 4 MG tablet Commonly known as:  ZOFRAN Take 4 mg by mouth 3 (three) times daily before meals.   predniSONE 20 MG tablet Commonly known as:  DELTASONE Take 2 tablets (40 mg total) by mouth daily with breakfast. X 3 days Start taking on:  07/03/2018   RISA-BID PROBIOTIC Tabs Take 1 tablet by mouth 2 (two) times daily. 10 day course starting on 06/29/2018   VENELEX Oint Apply to sacrum and bilateral buttocks q shift & prn every shift       No Known Allergies  Consultations:  none   Procedures/Studies: Dg Chest Portable 1 View  Result Date: 06/30/2018 CLINICAL DATA:  Sepsis.  Recent aspiration and coughing yesterday. EXAM: PORTABLE CHEST 1 VIEW COMPARISON:  11/13/2016 FINDINGS: Patient is rotated to the right. Lungs are adequately inflated with hazy opacification over the left base/retrocardiac region. This may be partially due to patient positioning. Cardiomediastinal silhouette and remainder of the exam is unchanged. IMPRESSION: Hazy opacification over the left base/retrocardiac region which may be due to effusion with atelectasis versus infection. Electronically Signed   By: Elberta Fortisaniel  Boyle M.D.   On: 06/30/2018 19:24   Dg Swallowing Func-speech Pathology  Result Date: 07/01/2018 Objective Swallowing Evaluation: Type of Study: MBS-Modified Barium Swallow Study  Patient Details Name: Sabrina [name] MRN: [id number] Date of Birth: December 31, 1931 Today's Date: 07/01/2018 Time: SLP Start Time (ACUTE ONLY): 1530 -SLP Stop Time (ACUTE ONLY): 1600 SLP Time Calculation (min) (ACUTE ONLY): 30 min Past Medical History: Past Medical History: Diagnosis Date . Asthma  . Dementia  . Diabetes mellitus  . H. pylori infection MAR 2014 . Hypertension  . Stroke [facility](HCC)  Past  Surgical History: Past Surgical History: Procedure Laterality Date . CHOLECYSTECTOMY   . COLECTOMY    secondary to diverticulitis . ESOPHAGOGASTRODUODENOSCOPY (EGD) WITH ESOPHAGEAL DILATION N/A 01/07/2013  WUJ:[id number]:Schatzki ring was found at the gastroesophageal junction/SINGLE DUODENAL DIVERTICULUM HPI: Sabrina Amassudrey K Garciais a 84 y.o.femalewith medical history significant ofCVA; HTN; DM; and dementia presenting with cough.She has dementia and is unable to provide history. She is able to report that she does not have pain. She is oriented to self only. [facility] Chest x-ray shows: Hazy opacification over the left base/retrocardiac region which may  Subjective: "Ok" Assessment / Plan / Recommendation CHL IP CLINICAL IMPRESSIONS 07/01/2018 Clinical Impression Pt presents with mild/mod oral phase and moderate pharyngeal phase dysphagia characterized by delayed oral transit, weak lingual manipulation, premature spillage of all liquids to the pyriforms, delay in swallow initiation, decreased timing/relaxation of UES, reduced hyolaryngeal excursion, epiglottic deflection and tongue base retraction, and decreased laryngeal closure resulting in delay in swallow initiation, penetration/aspiration usually silent in trace to mod amounts with thin and nectars, and significant vallecular and pyriform residue after the swallow increasing risk for aspiration after the swallow. Chin tuck was attempted and found to be ineffective. Pt will be at risk for aspiration across all  liquids due to delay in initiation, poor sensation, and significant residuals post swallow. Pt appears safest (while still at risk for aspiration and dehydration) with D2 and honey-thick liquids (HTL) with cues for small sips, swallow 2x, and clear throat/cough periodically. Pt with known esophageal phase dysphagia from UGI in 2017 and this was also observed today during esophageal sweep (incomplete barium clearance  throughout the esophagus). Recommend D2 with option to downgrade to D1 per treating SLP at SNF due to esophageal phase dysphagia and HTL. Also recommend reassessment when treating SLP deems clinically appropriate to see if Pt can move back to thin or even NTL. Consider palliative care consult. SLP will follow during acute stay.  SLP Visit Diagnosis Dysphagia, oropharyngeal phase (R13.12) Attention and concentration deficit following -- Frontal lobe and executive function deficit following -- Impact on safety and function Moderate aspiration risk;Severe aspiration risk;Risk for inadequate nutrition/hydration   CHL IP TREATMENT RECOMMENDATION 07/01/2018 Treatment Recommendations Therapy as outlined in treatment plan below   Prognosis 07/01/2018 Prognosis for Safe Diet Advancement Fair Barriers to Reach Goals Cognitive deficits;Severity of deficits Barriers/Prognosis Comment -- CHL IP DIET RECOMMENDATION 07/01/2018 SLP Diet Recommendations Dysphagia 2 (Fine chop) solids;Honey thick liquids Liquid Administration via Cup;No straw Medication Administration Whole meds with liquid Compensations Slow rate;Small sips/bites;Multiple dry swallows after each bite/sip;Clear throat intermittently Postural Changes Remain semi-upright after after feeds/meals (Comment);Seated upright at 90 degrees   CHL IP OTHER RECOMMENDATIONS 07/01/2018 Recommended Consults -- Oral Care Recommendations Oral care BID;Staff/trained caregiver to provide oral care Other Recommendations Order thickener from pharmacy;Clarify dietary restrictions   CHL IP FOLLOW UP RECOMMENDATIONS 07/01/2018 Follow up Recommendations Skilled Nursing facility   [facility] IP FREQUENCY AND DURATION 07/01/2018 Speech Therapy Frequency (ACUTE ONLY) min 2x/week Treatment Duration 1 week      CHL IP ORAL PHASE 07/01/2018 Oral Phase Impaired Oral - Pudding Teaspoon -- Oral - Pudding Cup -- Oral - Honey Teaspoon -- Oral - Honey Cup Weak lingual manipulation;Piecemeal swallowing;Delayed oral  transit Oral - Nectar Teaspoon -- Oral - Nectar Cup Weak lingual manipulation;Lingual/palatal residue;Piecemeal swallowing;Delayed oral transit;Decreased bolus cohesion;Premature spillage Oral - Nectar Straw -- Oral - Thin Teaspoon -- Oral - Thin Cup Weak lingual manipulation;Lingual/palatal residue;Piecemeal swallowing;Delayed oral transit;Decreased bolus cohesion;Premature spillage Oral - Thin Straw -- Oral - Puree Delayed oral transit;Piecemeal swallowing;Lingual/palatal residue Oral - Mech Soft Delayed oral transit;Piecemeal swallowing Oral - Regular -- Oral - Multi-Consistency -- Oral - Pill -- Oral Phase - Comment --  CHL IP PHARYNGEAL PHASE 07/01/2018 Pharyngeal Phase Impaired Pharyngeal- Pudding Teaspoon -- Pharyngeal -- Pharyngeal- Pudding Cup -- Pharyngeal -- Pharyngeal- Honey Teaspoon -- Pharyngeal -- Pharyngeal- Honey Cup Delayed swallow initiation-vallecula;Reduced epiglottic inversion;Pharyngeal residue - valleculae;Reduced tongue base retraction Pharyngeal -- Pharyngeal- Nectar Teaspoon -- Pharyngeal -- Pharyngeal- Nectar Cup Delayed swallow initiation-pyriform sinuses;Reduced epiglottic inversion;Reduced anterior laryngeal mobility;Reduced airway/laryngeal closure;Reduced tongue base retraction;Penetration/Aspiration during swallow;Trace aspiration;Pharyngeal residue - valleculae;Pharyngeal residue - pyriform;Moderate aspiration Pharyngeal Material enters airway, passes BELOW cords without attempt by patient to eject out (silent aspiration) Pharyngeal- Nectar Straw -- Pharyngeal -- Pharyngeal- Thin Teaspoon -- Pharyngeal -- Pharyngeal- Thin Cup Delayed swallow initiation-pyriform sinuses;Reduced epiglottic inversion;Reduced anterior laryngeal mobility;Reduced airway/laryngeal closure;Reduced tongue base retraction;Penetration/Aspiration during swallow;Penetration/Apiration after swallow;Trace aspiration;Pharyngeal residue - valleculae;Pharyngeal residue - pyriform Pharyngeal Material enters airway,  passes BELOW cords without attempt by patient to eject out (silent aspiration);Material enters airway, CONTACTS cords and not ejected out Pharyngeal- Thin Straw -- Pharyngeal -- Pharyngeal- Puree Delayed swallow initiation-vallecula;Reduced pharyngeal peristalsis;Reduced epiglottic inversion;Reduced anterior laryngeal mobility;Reduced tongue base retraction;Pharyngeal residue -  valleculae Pharyngeal -- Pharyngeal- Mechanical Soft Delayed swallow initiation-vallecula;Reduced epiglottic inversion;Reduced anterior laryngeal mobility;Reduced tongue base retraction;Pharyngeal residue - valleculae Pharyngeal -- Pharyngeal- Regular -- Pharyngeal -- Pharyngeal- Multi-consistency -- Pharyngeal -- Pharyngeal- Pill Delayed swallow initiation-vallecula Pharyngeal -- Pharyngeal Comment --  CHL IP CERVICAL ESOPHAGEAL PHASE 07/01/2018 Cervical Esophageal Phase Impaired Pudding Teaspoon -- Pudding Cup -- Honey Teaspoon -- Honey Cup -- Nectar Teaspoon -- Nectar Cup -- Nectar Straw -- Thin Teaspoon -- Thin Cup -- Thin Straw -- Puree Prominent cricopharyngeal segment Mechanical Soft -- Regular -- Multi-consistency -- Pill -- Cervical Esophageal Comment retention of barium tinged liquids/solids noted throughout distal esophagus Thank you, Havery Moros, CCC-SLP [phone] PORTER,DABNEY 07/01/2018, 4:53 PM                   Discharge Exam: Vitals:   2018/07/06 0655 2018-07-06 1457  BP: (!) 143/79 (!) 156/138  Pulse: 94 96  Resp: 17 17  Temp: 97.7 F (36.5 C) 98.4 F (36.9 C)  SpO2: 95% 99%   Vitals:   07/01/18 2111 07/01/18 2306 07-06-18 0655 07-06-2018 1457  BP: 137/65  (!) 143/79 (!) 156/138  Pulse: 75  94 96  Resp: 17  17 17   Temp: 97.7 F (36.5 C)  97.7 F (36.5 C) 98.4 F (36.9 C)  TempSrc: Oral  Oral Oral  SpO2: 99% 98% 95% 99%  Weight:      Height:        General: Pt is alert, awake, not in acute distress Cardiovascular: RRR, S1/S2 +, no rubs, no gallops Respiratory: bibasilar rales, no  wheeze Abdominal: Soft, NT, ND, bowel sounds + Extremities: no edema, no cyanosis   The results of significant diagnostics from this hospitalization (including imaging, microbiology, ancillary and laboratory) are listed below for reference.    Significant Diagnostic Studies: Dg Chest Portable 1 View  Result Date: 06/30/2018 CLINICAL DATA:  Sepsis.  Recent aspiration and coughing yesterday. EXAM: PORTABLE CHEST 1 VIEW COMPARISON:  11/13/2016 FINDINGS: Patient is rotated to the right. Lungs are adequately inflated with hazy opacification over the left base/retrocardiac region. This may be partially due to patient positioning. Cardiomediastinal silhouette and remainder of the exam is unchanged. IMPRESSION: Hazy opacification over the left base/retrocardiac region which may be due to effusion with atelectasis versus infection. Electronically Signed   By: Elberta Fortis M.D.   On: 06/30/2018 19:24   Dg Swallowing Func-speech Pathology  Result Date: 07/01/2018 Objective Swallowing Evaluation: Type of Study: MBS-Modified Barium Swallow Study  Patient Details Name: Sabrina [name] MRN: [id number] Date of Birth: Nov 28, 1931 Today's Date: 07/01/2018 Time: SLP Start Time (ACUTE ONLY): 1530 -SLP Stop Time (ACUTE ONLY): 1600 SLP Time Calculation (min) (ACUTE ONLY): 30 min Past Medical History: Past Medical History: Diagnosis Date . Asthma  . Dementia  . Diabetes mellitus  . H. pylori infection MAR 2014 . Hypertension  . Stroke [facility])  Past Surgical History: Past Surgical History: Procedure Laterality Date . CHOLECYSTECTOMY   . COLECTOMY    secondary to diverticulitis . ESOPHAGOGASTRODUODENOSCOPY (EGD) WITH ESOPHAGEAL DILATION N/A 01/07/2013  [id number] ring was found at the gastroesophageal junction/SINGLE DUODENAL DIVERTICULUM HPI: Sabrina [name] a 84 y.o.femalewith medical history significant ofCVA; HTN; DM; and dementia presenting with cough.She has dementia and is unable to provide history. She is able to  report that she does not have pain. She is oriented to self only. Pt reportedly consumes a puree diet and thin liquids at [facility] Chest x-ray shows: Hazy opacification over the left base/retrocardiac region which may  Subjective: "Ok" Assessment / Plan / Recommendation CHL IP CLINICAL IMPRESSIONS 07/01/2018 Clinical Impression Pt presents with mild/mod oral phase and moderate pharyngeal phase dysphagia characterized by delayed oral transit, weak lingual manipulation, premature spillage of all liquids to the pyriforms, delay in swallow initiation, decreased timing/relaxation of UES, reduced hyolaryngeal excursion, epiglottic deflection and tongue base retraction, and decreased laryngeal closure resulting in delay in swallow initiation, penetration/aspiration usually silent in trace to mod amounts with thin and nectars, and significant vallecular and pyriform residue after the swallow increasing risk for aspiration after the swallow. Chin tuck was attempted and found to be ineffective. Pt will be at risk for aspiration across all liquids due to delay in initiation, poor sensation, and significant residuals post swallow. Pt appears safest (while still at risk for aspiration and dehydration) with D2 and honey-thick liquids (HTL) with cues for small sips, swallow 2x, and clear throat/cough periodically. Pt with known esophageal phase dysphagia from UGI in 2017 and this was also observed today during esophageal sweep (incomplete barium clearance throughout the esophagus). Recommend D2 with option to downgrade to D1 per treating SLP at SNF due to esophageal phase dysphagia and HTL. Also recommend reassessment when treating SLP deems clinically appropriate to see if Pt can move back to thin or even NTL. Consider palliative care consult. SLP will follow during acute stay.  SLP Visit Diagnosis Dysphagia, oropharyngeal phase (R13.12) Attention and concentration deficit following -- Frontal lobe and executive function deficit  following -- Impact on safety and function Moderate aspiration risk;Severe aspiration risk;Risk for inadequate nutrition/hydration   CHL IP TREATMENT RECOMMENDATION 07/01/2018 Treatment Recommendations Therapy as outlined in treatment plan below   Prognosis 07/01/2018 Prognosis for Safe Diet Advancement Fair Barriers to Reach Goals Cognitive deficits;Severity of deficits Barriers/Prognosis Comment -- CHL IP DIET RECOMMENDATION 07/01/2018 SLP Diet Recommendations Dysphagia 2 (Fine chop) solids;Honey thick liquids Liquid Administration via Cup;No straw Medication Administration Whole meds with liquid Compensations Slow rate;Small sips/bites;Multiple dry swallows after each bite/sip;Clear throat intermittently Postural Changes Remain semi-upright after after feeds/meals (Comment);Seated upright at 90 degrees   CHL IP OTHER RECOMMENDATIONS 07/01/2018 Recommended Consults -- Oral Care Recommendations Oral care BID;Staff/trained caregiver to provide oral care Other Recommendations Order thickener from pharmacy;Clarify dietary restrictions   CHL IP FOLLOW UP RECOMMENDATIONS 07/01/2018 Follow up Recommendations Skilled Nursing facility   [facility] IP FREQUENCY AND DURATION 07/01/2018 Speech Therapy Frequency (ACUTE ONLY) min 2x/week Treatment Duration 1 week      CHL IP ORAL PHASE 07/01/2018 Oral Phase Impaired Oral - Pudding Teaspoon -- Oral - Pudding Cup -- Oral - Honey Teaspoon -- Oral - Honey Cup Weak lingual manipulation;Piecemeal swallowing;Delayed oral transit Oral - Nectar Teaspoon -- Oral - Nectar Cup Weak lingual manipulation;Lingual/palatal residue;Piecemeal swallowing;Delayed oral transit;Decreased bolus cohesion;Premature spillage Oral - Nectar Straw -- Oral - Thin Teaspoon -- Oral - Thin Cup Weak lingual manipulation;Lingual/palatal residue;Piecemeal swallowing;Delayed oral transit;Decreased bolus cohesion;Premature spillage Oral - Thin Straw -- Oral - Puree Delayed oral transit;Piecemeal swallowing;Lingual/palatal residue  Oral - Mech Soft Delayed oral transit;Piecemeal swallowing Oral - Regular -- Oral - Multi-Consistency -- Oral - Pill -- Oral Phase - Comment --  CHL IP PHARYNGEAL PHASE 07/01/2018 Pharyngeal Phase Impaired Pharyngeal- Pudding Teaspoon -- Pharyngeal -- Pharyngeal- Pudding Cup -- Pharyngeal -- Pharyngeal- Honey Teaspoon -- Pharyngeal -- Pharyngeal- Honey Cup Delayed swallow initiation-vallecula;Reduced epiglottic inversion;Pharyngeal residue - valleculae;Reduced tongue base retraction Pharyngeal -- Pharyngeal- Nectar Teaspoon -- Pharyngeal -- Pharyngeal- Nectar Cup Delayed swallow initiation-pyriform sinuses;Reduced epiglottic inversion;Reduced anterior laryngeal mobility;Reduced airway/laryngeal closure;Reduced tongue base  retraction;Penetration/Aspiration during swallow;Trace aspiration;Pharyngeal residue - valleculae;Pharyngeal residue - pyriform;Moderate aspiration Pharyngeal Material enters airway, passes BELOW cords without attempt by patient to eject out (silent aspiration) Pharyngeal- Nectar Straw -- Pharyngeal -- Pharyngeal- Thin Teaspoon -- Pharyngeal -- Pharyngeal- Thin Cup Delayed swallow initiation-pyriform sinuses;Reduced epiglottic inversion;Reduced anterior laryngeal mobility;Reduced airway/laryngeal closure;Reduced tongue base retraction;Penetration/Aspiration during swallow;Penetration/Apiration after swallow;Trace aspiration;Pharyngeal residue - valleculae;Pharyngeal residue - pyriform Pharyngeal Material enters airway, passes BELOW cords without attempt by patient to eject out (silent aspiration);Material enters airway, CONTACTS cords and not ejected out Pharyngeal- Thin Straw -- Pharyngeal -- Pharyngeal- Puree Delayed swallow initiation-vallecula;Reduced pharyngeal peristalsis;Reduced epiglottic inversion;Reduced anterior laryngeal mobility;Reduced tongue base retraction;Pharyngeal residue - valleculae Pharyngeal -- Pharyngeal- Mechanical Soft Delayed swallow initiation-vallecula;Reduced epiglottic  inversion;Reduced anterior laryngeal mobility;Reduced tongue base retraction;Pharyngeal residue - valleculae Pharyngeal -- Pharyngeal- Regular -- Pharyngeal -- Pharyngeal- Multi-consistency -- Pharyngeal -- Pharyngeal- Pill Delayed swallow initiation-vallecula Pharyngeal -- Pharyngeal Comment --  CHL IP CERVICAL ESOPHAGEAL PHASE 07/01/2018 Cervical Esophageal Phase Impaired Pudding Teaspoon -- Pudding Cup -- Honey Teaspoon -- Honey Cup -- Nectar Teaspoon -- Nectar Cup -- Nectar Straw -- Thin Teaspoon -- Thin Cup -- Thin Straw -- Puree Prominent cricopharyngeal segment Mechanical Soft -- Regular -- Multi-consistency -- Pill -- Cervical Esophageal Comment retention of barium tinged liquids/solids noted throughout distal esophagus Thank you, Havery Moros, CCC-SLP [phone] PORTER,DABNEY 07/01/2018, 4:53 PM                Microbiology: Recent Results (from the past 240 hour(s))  Blood Culture (routine x 2)     Status: None (Preliminary result)   Collection Time: 06/30/18  6:55 PM  Result Value Ref Range Status   Specimen Description RIGHT ANTECUBITAL  Final   Special Requests   Final    BOTTLES DRAWN AEROBIC AND ANAEROBIC Blood Culture adequate volume   Culture   Final    NO GROWTH 2 DAYS Performed at [facility], [address], [place], [place] [address]    Report Status PENDING  Incomplete  Blood Culture (routine x 2)     Status: None (Preliminary result)   Collection Time: 06/30/18  6:56 PM  Result Value Ref Range Status   Specimen Description BLOOD LEFT ARM  Final   Special Requests   Final    BOTTLES DRAWN AEROBIC AND ANAEROBIC Blood Culture adequate volume   Culture   Final    NO GROWTH 2 DAYS Performed at [facility], [address] [place], [place] [address]    Report Status PENDING  Incomplete  MRSA PCR Screening     Status: None   Collection Time: 07/01/18  6:13 AM  Result Value Ref Range Status   MRSA by PCR NEGATIVE NEGATIVE Final    Comment:        The GeneXpert MRSA  Assay (FDA approved for NASAL specimens only), is one component of a comprehensive MRSA colonization surveillance program. It is not intended to diagnose MRSA infection nor to guide or monitor treatment for MRSA infections. Performed at [facility], [address] [place], [place] [address]      Labs: Basic Metabolic Panel: Recent Labs  Lab 06/28/18 0700 06/30/18 1855 07/01/18 0535 21-Jul-2018 0514  NA 142 138 [id number]  K 3.6 3.4* 3.3* 3.6  CL 109 104 106 112*  CO2 26 24 26 24   GLUCOSE 88 233* 109* 131*  BUN 12 18 14 11   CREATININE 0.71 0.83 0.69 0.65  CALCIUM 8.3* 8.4* 7.9* 7.5*   Liver Function Tests: Recent Labs  Lab 06/30/18 1855  AST 23  ALT 11  ALKPHOS 53  BILITOT 0.6  PROT 6.5  ALBUMIN 3.2*   No results for input(s): LIPASE, AMYLASE in the last 168 hours. No results for input(s): AMMONIA in the last 168 hours. CBC: Recent Labs  Lab 06/28/18 0700 06/30/18 1855 07/01/18 0535  WBC 4.4 12.7* 12.4*  NEUTROABS 2.2 11.4* 10.3*  HGB 11.5* 11.5* 10.9*  HCT 36.7 35.9* 34.7*  MCV 90.8 90.9 92.3  PLT 215 188 150   Cardiac Enzymes: No results for input(s): CKTOTAL, CKMB, CKMBINDEX, TROPONINI in the last 168 hours. BNP: Invalid input(s): POCBNP CBG: No results for input(s): GLUCAP in the last 168 hours.  Time coordinating discharge:  36 minutes  Signed:  Catarina Hartshorn, DO [facility] Pager: [phone] 07-04-18, 3:01 PM

## 2018-07-03 ENCOUNTER — Non-Acute Institutional Stay (SKILLED_NURSING_FACILITY): Payer: Medicare Other | Admitting: Internal Medicine

## 2018-07-03 ENCOUNTER — Encounter: Payer: Self-pay | Admitting: Internal Medicine

## 2018-07-03 ENCOUNTER — Other Ambulatory Visit (HOSPITAL_COMMUNITY)
Admission: AD | Admit: 2018-07-03 | Discharge: 2018-07-03 | Disposition: A | Discharge status: Home / Self Care | Payer: Medicare Other | Source: Skilled Nursing Facility | Attending: Internal Medicine | Admitting: Internal Medicine

## 2018-07-03 ENCOUNTER — Telehealth: Payer: Self-pay

## 2018-07-03 DIAGNOSIS — K224 Dyskinesia of esophagus: Secondary | ICD-10-CM

## 2018-07-03 DIAGNOSIS — R197 Diarrhea, unspecified: Secondary | ICD-10-CM

## 2018-07-03 DIAGNOSIS — D509 Iron deficiency anemia, unspecified: Secondary | ICD-10-CM

## 2018-07-03 DIAGNOSIS — F0390 Unspecified dementia without behavioral disturbance: Secondary | ICD-10-CM | POA: Diagnosis not present

## 2018-07-03 DIAGNOSIS — J189 Pneumonia, unspecified organism: Secondary | ICD-10-CM | POA: Diagnosis not present

## 2018-07-03 DIAGNOSIS — R05 Cough: Secondary | ICD-10-CM | POA: Diagnosis not present

## 2018-07-03 DIAGNOSIS — A419 Sepsis, unspecified organism: Secondary | ICD-10-CM

## 2018-07-03 DIAGNOSIS — R059 Cough, unspecified: Secondary | ICD-10-CM

## 2018-07-03 LAB — C DIFFICILE QUICK SCREEN W PCR REFLEX
C DIFFICILE (CDIFF) TOXIN: NEGATIVE
C DIFFICLE (CDIFF) ANTIGEN: NEGATIVE
C Diff interpretation: NOT DETECTED

## 2018-07-03 NOTE — Progress Notes (Signed)
Location:    [facility] Room Number: 115/W Place of Service:  SNF [phone]) Provider: Edmon Crape PA-C  [name], MD  Patient Care Team: [name], MD as PCP - General (Internal Medicine) Synetta Shadow as Physician Assistant (Internal Medicine)  Extended Emergency Contact Information Primary Emergency Contact: [name] [place] Home Phone: [phone] Relation: Son Secondary Emergency Contact: [name] [place] Home Phone: [phone] Mobile Phone: [phone] Relation: Son  Code Status:  Full Code Goals of care: Advanced Directive information Advanced Directives 07/03/2018  Does Patient Have a Medical Advance Directive? Yes  Type of Advance Directive (No Data)  Does patient want to make changes to medical advance directive? No - Patient declined  Copy of Healthcare Power of Attorney in Chart? No - copy requested  Would patient like information on creating a medical advance directive? No - Patient declined  Pre-existing out of facility DNR order (yellow form or pink MOST form) -     Chief Complaint  Patient presents with  . Hospitalization Follow-up    Hospitalization f/u visit   Hospitalization follow-up for sepsis thought most likely due to pneumonia  HPI:  Pt is a 84 y.o. female seen today for a hospital f/u for sepsis thought most likely due to pneumonia  Patient is a long-term resident of facility with a history of CVA as well as hypertension type 2 diabetes advanced dementia as well as esophageal dysmotility osteoporosis and anemia.  Patient over the weekend developed a cough and some congestion and was empirically started on Augmentin x-ray in the facility actually was negative for any acute process but her condition did deteriorate somewhat with increased cough and lethargy with hypoxia and tachycardia.  She was thought to have sepsis with hypoxia tachycardia and the fever-lactic  acid was greater than 4- x-ray showed hazy opacification over the left base possible infection.  She was given IV fluids and started on vancomycin cefepime and Flagyl.  She improved clinically was weaned off oxygen heart rate improved.  And appeared to be stable.  Speech therapy did recommend a dysphagia 2 diet with honey thickened liquids.  She has come back with recommendation for 5 additional days of Augmentin.  She also had mild hypokalemia which was supplemented in the hospital.  Apparently her other medical conditions were fairly stable in the hospital.  Currently she is sitting in her wheelchair comfortably she is eating her dessert-says he still feels weak but better than she did when she was in the hospital- nursing staff reports she appears to be having possibly some diarrhea.  Vital signs appear to be stable mental status appears to be at baseline pleasantly confused at times but pleasant appropriate.   Past Medical History:  Diagnosis Date  . Asthma   . Dementia   . Diabetes mellitus   . H. pylori infection MAR 2014  . Hypertension   . Stroke [facility])    Past Surgical History:  Procedure Laterality Date  . CHOLECYSTECTOMY    . COLECTOMY     secondary to diverticulitis  . ESOPHAGOGASTRODUODENOSCOPY (EGD) WITH ESOPHAGEAL DILATION N/A 01/07/2013   QIO:[id number] ring was found at the gastroesophageal junction/SINGLE DUODENAL DIVERTICULUM    No Known Allergies  Allergies as of 07/03/2018   No Known Allergies     Medication List        Accurate as of 07/03/18  8:57 AM. Always use your most recent med list.  amoxicillin-clavulanate 875-125 MG tablet Commonly known as:  AUGMENTIN Take 1 tablet by mouth every 12 (twelve) hours.   BIOFREEZE 4 % Gel Generic drug:  Menthol (Topical Analgesic) Apply 1 application topically daily as needed (for knee pain management). Apply prn to knees for pain management as needed daily   CALCIUM 500/VITAMIN D PO Take 500  mg by mouth 2 (two) times daily.   citalopram 20 MG tablet Commonly known as:  CELEXA Take 20 mg by mouth daily.   denosumab 60 MG/ML Soln injection Commonly known as:  PROLIA Inject 60 mg into the skin every 6 (six) months. Administer in upper arm, thigh, or abdomen   ipratropium 0.02 % nebulizer solution Commonly known as:  ATROVENT Take 0.5 mg by nebulization every 4 (four) hours as needed for wheezing or shortness of breath.   Iron 325 (65 Fe) MG Tabs Take 325 mg by mouth 2 (two) times daily.   loratadine 10 MG tablet Commonly known as:  CLARITIN Take 10 mg by mouth daily.   memantine 10 MG tablet Commonly known as:  NAMENDA Take 10 mg by mouth 2 (two) times daily.   mirtazapine 15 MG tablet Commonly known as:  REMERON Take 15 mg by mouth at bedtime.   omeprazole 40 MG capsule Commonly known as:  PRILOSEC Take 40 mg by mouth 2 (two) times daily.   ondansetron 4 MG tablet Commonly known as:  ZOFRAN Take 4 mg by mouth 3 (three) times daily before meals.   predniSONE 20 MG tablet Commonly known as:  DELTASONE Take 2 tablets (40 mg total) by mouth daily with breakfast. X 3 days   RISA-BID PROBIOTIC Tabs Take 1 tablet by mouth 2 (two) times daily. 10 day course starting on 06/29/2018   VENELEX Oint Apply to sacrum and bilateral buttocks q shift & prn every shift       Review of Systems   This is limited secondary to dementia again she says she feels somewhat weak but better than she did in the hospital- mental status appears to be at baseline she is not complaining of pain or shortness of breath says she still has a cough productive of phlegm fairly clear   Does not complain of chest pain or stomach discomfort  Immunization History  Administered Date(s) Administered  . Influenza, High Dose Seasonal PF 2018-07-31  . Influenza-Unspecified 07/20/2016, 07/20/2017  . Pneumococcal Conjugate-13 06/12/2017  . Pneumococcal-Unspecified 07/24/2016  . Tdap 07/12/2017     Pertinent  Health Maintenance Due  Topic Date Due  . OPHTHALMOLOGY EXAM  08/02/2018 (Originally 04/05/2018)  . URINE MICROALBUMIN  08/02/2018 (Originally 06/03/1942)  . HEMOGLOBIN A1C  11/30/2018  . FOOT EXAM  01/24/2019  . INFLUENZA VACCINE  Completed  . DEXA SCAN  Completed  . PNA vac Low Risk Adult  Completed   Fall Risk  07/12/2017  Falls in the past year? No   Functional Status Survey:    Vitals:   07/03/18 0838  BP: 135/69  Pulse: 67  Resp: (!) 22  Temp: 99.8 F (37.7 C)  TempSrc: Oral  SpO2: 92%    Physical Exam  In general this is a pleasant elderly female in no distress sitting comfortably in her wheelchair.  Her skin is warm and dry she does have somewhat of a darkened area on her left cheek- nursing will talk to family about possible dermatology consult  Eyes visual acuity appears to be intact sclera conjunctive are clear without drainage.  Oropharynx is clear mucous  membranes moist.  Chest she does have some scattered expiratory rhonchi there is no labored breathing.  Heart is regular rate and rhythm without murmur gallop or rub she does not really have significant lower extremity edema pedal pulses are intact bilaterally.  Abdomen is soft is is not tender  does have active bowel sounds  Musculoskeletal does move all extremities x4 at baseline does ambulate in a wheelchair strength appears relatively baseline all 4 extremities.  Neurologic is grossly intact her speech is clear no overt lateralizing findings.  Psych she is oriented to self follow simple verbal commands without difficulty she is pleasant talks some appears to be mentally at her baseline   Labs reviewed: Recent Labs    11/16/17 0700  06/30/18 1855 07/01/18 0535 06/26/2018 0514  NA  --    < > 138 139 142  K  --    < > 3.4* 3.3* 3.6  CL  --    < > 104 106 112*  CO2  --    < > 24 26 24   GLUCOSE  --    < > 233* 109* 131*  BUN  --    < > 18 14 11   CREATININE  --    < > 0.83 0.69 0.65   CALCIUM  --    < > 8.4* 7.9* 7.5*  MG 1.8  --   --   --   --   PHOS 3.9  --   --   --   --    < > = values in this interval not displayed.   Recent Labs    10/10/17 0830 06/30/18 1855  AST 19 23  ALT 9* 11  ALKPHOS 61 53  BILITOT 0.6 0.6  PROT 6.7 6.5  ALBUMIN 3.7 3.2*   Recent Labs    06/28/18 0700 06/30/18 1855 07/01/18 0535  WBC 4.4 12.7* 12.4*  NEUTROABS 2.2 11.4* 10.3*  HGB 11.5* 11.5* 10.9*  HCT 36.7 35.9* 34.7*  MCV 90.8 90.9 92.3  PLT 215 188 150   Lab Results  Component Value Date   TSH 1.030 09/11/2017   Lab Results  Component Value Date   HGBA1C 5.2 05/30/2018   No results found for: CHOL, HDL, LDLCALC, LDLDIRECT, TRIG, CHOLHDL  Significant Diagnostic Results in last 30 days:  Dg Chest Portable 1 View  Result Date: 06/30/2018 CLINICAL DATA:  Sepsis.  Recent aspiration and coughing yesterday. EXAM: PORTABLE CHEST 1 VIEW COMPARISON:  11/13/2016 FINDINGS: Patient is rotated to the right. Lungs are adequately inflated with hazy opacification over the left base/retrocardiac region. This may be partially due to patient positioning. Cardiomediastinal silhouette and remainder of the exam is unchanged. IMPRESSION: Hazy opacification over the left base/retrocardiac region which may be due to effusion with atelectasis versus infection. Electronically Signed   By: Elberta Fortisaniel  Boyle M.D.   On: 06/30/2018 19:24   Dg Swallowing Func-speech Pathology  Result Date: 07/01/2018 Objective Swallowing Evaluation: Type of Study: MBS-Modified Barium Swallow Study  Patient Details Name: Sabrina [name] MRN: [id number] Date of Birth: 1932/07/25 Today's Date: 07/01/2018 Time: SLP Start Time (ACUTE ONLY): 1530 -SLP Stop Time (ACUTE ONLY): 1600 SLP Time Calculation (min) (ACUTE ONLY): 30 min Past Medical History: Past Medical History: Diagnosis Date . Asthma  . Dementia  . Diabetes mellitus  . H. pylori infection MAR 2014 . Hypertension  . Stroke [facility])  Past Surgical History: Past Surgical  History: Procedure Laterality Date . CHOLECYSTECTOMY   . COLECTOMY    secondary to  diverticulitis . ESOPHAGOGASTRODUODENOSCOPY (EGD) WITH ESOPHAGEAL DILATION N/A 01/07/2013  BJY:[id number] ring was found at the gastroesophageal junction/SINGLE DUODENAL DIVERTICULUM HPI: RAMONITA KOENIG a 84 y.o.femalewith medical history significant ofCVA; HTN; DM; and dementia presenting with cough.She has dementia and is unable to provide history. She is able to report that she does not have pain. She is oriented to self only. Pt reportedly consumes a puree diet and thin liquids at [facility]. Chest x-ray shows: Hazy opacification over the left base/retrocardiac region which may  Subjective: "Ok" Assessment / Plan / Recommendation CHL IP CLINICAL IMPRESSIONS 07/01/2018 Clinical Impression Pt presents with mild/mod oral phase and moderate pharyngeal phase dysphagia characterized by delayed oral transit, weak lingual manipulation, premature spillage of all liquids to the pyriforms, delay in swallow initiation, decreased timing/relaxation of UES, reduced hyolaryngeal excursion, epiglottic deflection and tongue base retraction, and decreased laryngeal closure resulting in delay in swallow initiation, penetration/aspiration usually silent in trace to mod amounts with thin and nectars, and significant vallecular and pyriform residue after the swallow increasing risk for aspiration after the swallow. Chin tuck was attempted and found to be ineffective. Pt will be at risk for aspiration across all liquids due to delay in initiation, poor sensation, and significant residuals post swallow. Pt appears safest (while still at risk for aspiration and dehydration) with D2 and honey-thick liquids (HTL) with cues for small sips, swallow 2x, and clear throat/cough periodically. Pt with known esophageal phase dysphagia from UGI in 2017 and this was also observed today during esophageal sweep (incomplete barium clearance throughout the esophagus).  Recommend D2 with option to downgrade to D1 per treating SLP at SNF due to esophageal phase dysphagia and HTL. Also recommend reassessment when treating SLP deems clinically appropriate to see if Pt can move back to thin or even NTL. Consider palliative care consult. SLP will follow during acute stay.  SLP Visit Diagnosis Dysphagia, oropharyngeal phase (R13.12) Attention and concentration deficit following -- Frontal lobe and executive function deficit following -- Impact on safety and function Moderate aspiration risk;Severe aspiration risk;Risk for inadequate nutrition/hydration   CHL IP TREATMENT RECOMMENDATION 07/01/2018 Treatment Recommendations Therapy as outlined in treatment plan below   Prognosis 07/01/2018 Prognosis for Safe Diet Advancement Fair Barriers to Reach Goals Cognitive deficits;Severity of deficits Barriers/Prognosis Comment -- CHL IP DIET RECOMMENDATION 07/01/2018 SLP Diet Recommendations Dysphagia 2 (Fine chop) solids;Honey thick liquids Liquid Administration via Cup;No straw Medication Administration Whole meds with liquid Compensations Slow rate;Small sips/bites;Multiple dry swallows after each bite/sip;Clear throat intermittently Postural Changes Remain semi-upright after after feeds/meals (Comment);Seated upright at 90 degrees   CHL IP OTHER RECOMMENDATIONS 07/01/2018 Recommended Consults -- Oral Care Recommendations Oral care BID;Staff/trained caregiver to provide oral care Other Recommendations Order thickener from pharmacy;Clarify dietary restrictions   CHL IP FOLLOW UP RECOMMENDATIONS 07/01/2018 Follow up Recommendations Skilled Nursing facility   [facility] IP FREQUENCY AND DURATION 07/01/2018 Speech Therapy Frequency (ACUTE ONLY) min 2x/week Treatment Duration 1 week      CHL IP ORAL PHASE 07/01/2018 Oral Phase Impaired Oral - Pudding Teaspoon -- Oral - Pudding Cup -- Oral - Honey Teaspoon -- Oral - Honey Cup Weak lingual manipulation;Piecemeal swallowing;Delayed oral transit Oral - Nectar  Teaspoon -- Oral - Nectar Cup Weak lingual manipulation;Lingual/palatal residue;Piecemeal swallowing;Delayed oral transit;Decreased bolus cohesion;Premature spillage Oral - Nectar Straw -- Oral - Thin Teaspoon -- Oral - Thin Cup Weak lingual manipulation;Lingual/palatal residue;Piecemeal swallowing;Delayed oral transit;Decreased bolus cohesion;Premature spillage Oral - Thin Straw -- Oral - Puree Delayed oral transit;Piecemeal swallowing;Lingual/palatal residue Oral - Mech  Soft Delayed oral transit;Piecemeal swallowing Oral - Regular -- Oral - Multi-Consistency -- Oral - Pill -- Oral Phase - Comment --  CHL IP PHARYNGEAL PHASE 07/01/2018 Pharyngeal Phase Impaired Pharyngeal- Pudding Teaspoon -- Pharyngeal -- Pharyngeal- Pudding Cup -- Pharyngeal -- Pharyngeal- Honey Teaspoon -- Pharyngeal -- Pharyngeal- Honey Cup Delayed swallow initiation-vallecula;Reduced epiglottic inversion;Pharyngeal residue - valleculae;Reduced tongue base retraction Pharyngeal -- Pharyngeal- Nectar Teaspoon -- Pharyngeal -- Pharyngeal- Nectar Cup Delayed swallow initiation-pyriform sinuses;Reduced epiglottic inversion;Reduced anterior laryngeal mobility;Reduced airway/laryngeal closure;Reduced tongue base retraction;Penetration/Aspiration during swallow;Trace aspiration;Pharyngeal residue - valleculae;Pharyngeal residue - pyriform;Moderate aspiration Pharyngeal Material enters airway, passes BELOW cords without attempt by patient to eject out (silent aspiration) Pharyngeal- Nectar Straw -- Pharyngeal -- Pharyngeal- Thin Teaspoon -- Pharyngeal -- Pharyngeal- Thin Cup Delayed swallow initiation-pyriform sinuses;Reduced epiglottic inversion;Reduced anterior laryngeal mobility;Reduced airway/laryngeal closure;Reduced tongue base retraction;Penetration/Aspiration during swallow;Penetration/Apiration after swallow;Trace aspiration;Pharyngeal residue - valleculae;Pharyngeal residue - pyriform Pharyngeal Material enters airway, passes BELOW cords  without attempt by patient to eject out (silent aspiration);Material enters airway, CONTACTS cords and not ejected out Pharyngeal- Thin Straw -- Pharyngeal -- Pharyngeal- Puree Delayed swallow initiation-vallecula;Reduced pharyngeal peristalsis;Reduced epiglottic inversion;Reduced anterior laryngeal mobility;Reduced tongue base retraction;Pharyngeal residue - valleculae Pharyngeal -- Pharyngeal- Mechanical Soft Delayed swallow initiation-vallecula;Reduced epiglottic inversion;Reduced anterior laryngeal mobility;Reduced tongue base retraction;Pharyngeal residue - valleculae Pharyngeal -- Pharyngeal- Regular -- Pharyngeal -- Pharyngeal- Multi-consistency -- Pharyngeal -- Pharyngeal- Pill Delayed swallow initiation-vallecula Pharyngeal -- Pharyngeal Comment --  CHL IP CERVICAL ESOPHAGEAL PHASE 07/01/2018 Cervical Esophageal Phase Impaired Pudding Teaspoon -- Pudding Cup -- Honey Teaspoon -- Honey Cup -- Nectar Teaspoon -- Nectar Cup -- Nectar Straw -- Thin Teaspoon -- Thin Cup -- Thin Straw -- Puree Prominent cricopharyngeal segment Mechanical Soft -- Regular -- Multi-consistency -- Pill -- Cervical Esophageal Comment retention of barium tinged liquids/solids noted throughout distal esophagus Thank you, Havery Moros, CCC-SLP [phone] PORTER,DABNEY 07/01/2018, 4:53 PM               Assessment/Plan  #1 history of sepsis thought most likely due to pneumonia- she was treated with IV antibiotics in the hospital she has been discharged on 5 additional days of Augmentin - she also has Atrovent nebulizers every 4 hours as needed in addition to finishing a prednisone course.  She is on a dysphagia 2 diet with honey thick liquids per speech therapy recommendation in hospital.  Will add Mucinex for cough 600 mg twice daily for 5 days and monitor.  Nursing does report possible some diarrhea will order to check for C. difficile-she is not having any abdominal discomfort she does have a low-grade fever which would need  to be watched.  Does not give a septic presentation appears to be essentially at her baseline possibly a bit weaker but not overtly ill.  2.  History of acute metabolic encephalopathy thought secondary to infection I suspect this appears to have largely resolved.  3.  Hypokalemia this was mild and supplemented in the hospital will update a lab tomorrow.  4.  History of anemia she continues on iron twice a day-hemoglobin showed relative stability in the hospital at 10.9- will update this tomorrow as well- I do note she had some mild leukocytosis in the hospital would like to update this as well-again she is on prednisone which I suspect will skew the results.  5.-  History of osteoporosis continues on Prolia with calcium vitamin D.  6.  History of dementia this appears relatively baseline again mental status appears to be at baseline today she is on Namenda.  7.  History of esophageal dysmotility-she is on Zofran at one point had significant vomiting episodes but these have largely resolved with the Zofran routinely 3 times a day.  8.-She did have a urine culture done in the hospital final results show only 30,000 colonies of Pseudomonas at this point will monitor.  9.  Allergic rhinitis-she continues on Claritin she has been on this for some time.  10.  History of depression continues on Celexa at this appears relatively stable as well Again will update lab work tomorrow including a CBC and metabolic panel-continue to monitor her clinical status-will obtain a C. difficile culture as well.  [id number] note greater than 35 minutes spent assessing patient- reviewing her chart and labs-and coordinating and formulating a plan of care for numerous diagnoses- of note greater than 50% of time spent coordinating a plan of care with input as noted above

## 2018-07-03 NOTE — Telephone Encounter (Signed)
Possible re-admission to facility. [facility] needed if patient was re-admitted to facility upon discharge. [facility]

## 2018-07-04 ENCOUNTER — Encounter (HOSPITAL_COMMUNITY)
Admission: RE | Admit: 2018-07-04 | Discharge: 2018-07-04 | Disposition: A | Discharge status: Home / Self Care | Payer: Medicare Other | Source: Skilled Nursing Facility | Attending: Internal Medicine | Admitting: Internal Medicine

## 2018-07-04 ENCOUNTER — Non-Acute Institutional Stay (SKILLED_NURSING_FACILITY): Payer: Medicare Other | Admitting: Internal Medicine

## 2018-07-04 ENCOUNTER — Encounter: Payer: Self-pay | Admitting: Internal Medicine

## 2018-07-04 DIAGNOSIS — R195 Other fecal abnormalities: Secondary | ICD-10-CM | POA: Diagnosis not present

## 2018-07-04 DIAGNOSIS — R112 Nausea with vomiting, unspecified: Secondary | ICD-10-CM | POA: Diagnosis not present

## 2018-07-04 DIAGNOSIS — A419 Sepsis, unspecified organism: Secondary | ICD-10-CM | POA: Diagnosis present

## 2018-07-04 DIAGNOSIS — R41841 Cognitive communication deficit: Secondary | ICD-10-CM | POA: Diagnosis not present

## 2018-07-04 DIAGNOSIS — J189 Pneumonia, unspecified organism: Secondary | ICD-10-CM | POA: Diagnosis not present

## 2018-07-04 DIAGNOSIS — Z5189 Encounter for other specified aftercare: Secondary | ICD-10-CM | POA: Insufficient documentation

## 2018-07-04 DIAGNOSIS — M810 Age-related osteoporosis without current pathological fracture: Secondary | ICD-10-CM | POA: Diagnosis not present

## 2018-07-04 DIAGNOSIS — F0390 Unspecified dementia without behavioral disturbance: Secondary | ICD-10-CM | POA: Diagnosis not present

## 2018-07-04 LAB — CBC WITH DIFFERENTIAL/PLATELET
Basophils Absolute: 0 10*3/uL (ref 0.0–0.1)
Basophils Relative: 0 %
EOS ABS: 0 10*3/uL (ref 0.0–0.7)
Eosinophils Relative: 0 %
HCT: 34.2 % — ABNORMAL LOW (ref 36.0–46.0)
HEMOGLOBIN: 11 g/dL — AB (ref 12.0–15.0)
LYMPHS PCT: 19 %
Lymphs Abs: 1.3 10*3/uL (ref 0.7–4.0)
MCH: 29.5 pg (ref 26.0–34.0)
MCHC: 32.2 g/dL (ref 30.0–36.0)
MCV: 91.7 fL (ref 78.0–100.0)
Monocytes Absolute: 0.7 10*3/uL (ref 0.1–1.0)
Monocytes Relative: 10 %
Neutro Abs: 4.8 10*3/uL (ref 1.7–7.7)
Neutrophils Relative %: 71 %
PLATELETS: 190 10*3/uL (ref 150–400)
RBC: 3.73 MIL/uL — AB (ref 3.87–5.11)
RDW: 19.4 % — ABNORMAL HIGH (ref 11.5–15.5)
WBC: 6.8 10*3/uL (ref 4.0–10.5)

## 2018-07-04 LAB — URINE CULTURE: Culture: 30000 — AB

## 2018-07-04 LAB — BASIC METABOLIC PANEL
ANION GAP: 7 (ref 5–15)
BUN: 8 mg/dL (ref 8–23)
CO2: 23 mmol/L (ref 22–32)
Calcium: 8.4 mg/dL — ABNORMAL LOW (ref 8.9–10.3)
Chloride: 114 mmol/L — ABNORMAL HIGH (ref 98–111)
Creatinine, Ser: 0.64 mg/dL (ref 0.44–1.00)
GFR calc Af Amer: >60 mL/min (ref >60)
Glucose, Bld: 122 mg/dL — ABNORMAL HIGH (ref 70–99)
POTASSIUM: 3.4 mmol/L — AB (ref 3.5–5.1)
SODIUM: 144 mmol/L (ref 135–145)

## 2018-07-04 NOTE — Progress Notes (Signed)
Provider:Anjali Chales AbrahamsGupta MD   Location:    [facility] Room Number: 115/W Place of Service:  SNF ([phone])  PCP: Mahlon GammonGupta, Anjali L, MD Patient Care Team: Mahlon GammonGupta, Anjali L, MD as PCP - General (Internal Medicine) Roena MaladyLassen, Arlo C, PA-C as Physician Assistant (Internal Medicine)  Extended Emergency Contact Information Primary Emergency Contact: [name],[name]  [place] Home Phone: [phone] Relation: Son Secondary Emergency Contact: [name],[name]  [place] Home Phone: [phone] Mobile Phone: [phone] Relation: Son  Code Status: Full Code Goals of Care: Advanced Directive information Advanced Directives 07/03/2018  Does Patient Have a Medical Advance Directive? Yes  Type of Advance Directive (No Data)  Does patient want to make changes to medical advance directive? No - Patient declined  Copy of Healthcare Power of Attorney in Chart? No - copy requested  Would patient like information on creating a medical advance directive? No - Patient declined  Pre-existing out of facility DNR order (yellow form or pink MOST form) -      Chief Complaint  Patient presents with  . Readmit To SNF    Readmission Visit    HPI: Patient is a 84 y.o. female seen today for Readmission to Facility for Long term Placement. Patient has H/O Hypertension,Osteoporosis, recurrent Vomiting, with  GERD and Dementia.   Patient is a long-term resident of facility. She was send to the hospital for getting  Lethargic and Hypoxic in the facility. She was found to have Left Lower Lobe infiltrate. Was treated with IV Antibiotics and switched to Augmentin to finish the course in the facility. She was also seen by Speech a nd now is on D 2 Diet with Honey Thick Liquid. Patient c/o cough today but mostly dry. No Fever or chills. Her Mental status is back to baseline. Was not SOB . No Fever or chills.   Past Medical History:  Diagnosis Date  . Asthma   .  Dementia   . Diabetes mellitus   . H. pylori infection MAR 2014  . Hypertension   . Stroke [facility](HCC)    Past Surgical History:  Procedure Laterality Date  . CHOLECYSTECTOMY    . COLECTOMY     secondary to diverticulitis  . ESOPHAGOGASTRODUODENOSCOPY (EGD) WITH ESOPHAGEAL DILATION N/A 01/07/2013   WUX:[id number]:Schatzki ring was found at the gastroesophageal junction/SINGLE DUODENAL DIVERTICULUM    reports that she has never smoked. She has never used smokeless tobacco. She reports that she does not drink alcohol or use drugs. Social History   Socioeconomic History  . Marital status: Married    Spouse name: Not on file  . Number of children: Not on file  . Years of education: Not on file  . Highest education level: Not on file  Occupational History  . Not on file  Social Needs  . Financial resource strain: Not on file  . Food insecurity:    Worry: Not on file    Inability: Not on file  . Transportation needs:    Medical: Not on file    Non-medical: Not on file  Tobacco Use  . Smoking status: Never Smoker  . Smokeless tobacco: Never Used  Substance and Sexual Activity  . Alcohol use: No  . Drug use: No  . Sexual activity: Never  Lifestyle  . Physical activity:    Days per week: Not on file    Minutes per session: Not on file  . Stress: Not on file  Relationships  . Social connections:  Talks on phone: Not on file    Gets together: Not on file    Attends religious service: Not on file    Active member of club or organization: Not on file    Attends meetings of clubs or organizations: Not on file    Relationship status: Not on file  . Intimate partner violence:    Fear of current or ex partner: Not on file    Emotionally abused: Not on file    Physically abused: Not on file    Forced sexual activity: Not on file  Other Topics Concern  . Not on file  Social History Narrative  . Not on file    Functional Status Survey:    Family History  Problem Relation Age of Onset    . Colon cancer Neg Hx     Health Maintenance  Topic Date Due  . OPHTHALMOLOGY EXAM  08/02/2018 (Originally 04/05/2018)  . URINE MICROALBUMIN  08/02/2018 (Originally 06/03/1942)  . HEMOGLOBIN A1C  11/30/2018  . FOOT EXAM  01/24/2019  . TETANUS/TDAP  07/13/2027  . INFLUENZA VACCINE  Completed  . DEXA SCAN  Completed  . PNA vac Low Risk Adult  Completed    No Known Allergies  Outpatient Encounter Medications as of 07/04/2018  Medication Sig  . amoxicillin-clavulanate (AUGMENTIN) 875-125 MG tablet Take 1 tablet by mouth every 12 (twelve) hours.  [name] [place]-Castor Oil (VENELEX) OINT Apply to sacrum and bilateral buttocks q shift & prn every shift  . Calcium Carbonate-Vitamin D (CALCIUM 500/VITAMIN D PO) Take 500 mg by mouth 2 (two) times daily.  . citalopram (CELEXA) 20 MG tablet Take 20 mg by mouth daily.   Marland Kitchen denosumab (PROLIA) 60 MG/ML SOLN injection Inject 60 mg into the skin every 6 (six) months. Administer in upper arm, thigh, or abdomen  . Ferrous Sulfate (IRON) 325 (65 Fe) MG TABS Take 325 mg by mouth 2 (two) times daily.   Marland Kitchen guaiFENesin (MUCINEX) 600 MG 12 hr tablet Take 600 mg by mouth 2 (two) times daily.  Marland Kitchen ipratropium (ATROVENT) 0.02 % nebulizer solution Take 0.5 mg by nebulization every 4 (four) hours as needed for wheezing or shortness of breath.  . loratadine (CLARITIN) 10 MG tablet Take 10 mg by mouth daily.  . memantine (NAMENDA) 10 MG tablet Take 10 mg by mouth 2 (two) times daily.  . Menthol, Topical Analgesic, (BIOFREEZE) 4 % GEL Apply 1 application topically daily as needed (for knee pain management). Apply prn to knees for pain management as needed daily   . mirtazapine (REMERON) 15 MG tablet Take 15 mg by mouth at bedtime.  Marland Kitchen nystatin cream (MYCOSTATIN) Apply 1 application topically 3 (three) times daily.  Marland Kitchen omeprazole (PRILOSEC) 40 MG capsule Take 40 mg by mouth 2 (two) times daily.   . ondansetron (ZOFRAN) 4 MG tablet Take 4 mg by mouth 3 (three) times daily  before meals.   . potassium chloride SA (K-DUR,KLOR-CON) 20 MEQ tablet Take 20 mEq by mouth daily.  . predniSONE (DELTASONE) 20 MG tablet Take 2 tablets (40 mg total) by mouth daily with breakfast. X 3 days  . Probiotic Product (RISA-BID PROBIOTIC) TABS Take 1 tablet by mouth 2 (two) times daily. 10 day course starting on 06/29/2018   No facility-administered encounter medications on file as of 07/04/2018.      Review of Systems  Unable to perform ROS: Dementia    Vitals:   07/04/18 1202  BP: (!) 155/54  Pulse: 68  Resp: 19  Temp: 98.7 F (37.1 C)  TempSrc: Oral  SpO2: 95%   There is no height or weight on file to calculate BMI. Physical Exam  Constitutional: She appears well-developed and well-nourished.  HENT:  Head: Normocephalic.  Mouth/Throat: Oropharynx is clear and moist.  Eyes: Pupils are equal, round, and reactive to light.  Neck: Neck supple.  Cardiovascular: Normal rate and regular rhythm.  No murmur heard. Pulmonary/Chest: Effort normal and breath sounds normal. No stridor. No respiratory distress. She has no wheezes.  Abdominal: Soft. Bowel sounds are normal. She exhibits no distension. There is no tenderness. There is no guarding.  Musculoskeletal: She exhibits no edema.  Lymphadenopathy:    She has no cervical adenopathy.  Neurological: She is alert.  Follows simple commands. Not oriented. Answers Appropriately.  Skin: Skin is warm and dry.  Psychiatric: She has a normal mood and affect. Her behavior is normal.    Labs reviewed: Basic Metabolic Panel: Recent Labs    11/16/17 0700  07/01/18 0535 06/26/2018 0514 07/04/18 0700  NA  --    < > 139 142 144  K  --    < > 3.3* 3.6 3.4*  CL  --    < > 106 112* 114*  CO2  --    < > 26 24 23   GLUCOSE  --    < > 109* 131* 122*  BUN  --    < > 14 11 8   CREATININE  --    < > 0.69 0.65 0.64  CALCIUM  --    < > 7.9* 7.5* 8.4*  MG 1.8  --   --   --   --   PHOS 3.9  --   --   --   --    < > = values in this  interval not displayed.   Liver Function Tests: Recent Labs    10/10/17 0830 06/30/18 1855  AST 19 23  ALT 9* 11  ALKPHOS 61 53  BILITOT 0.6 0.6  PROT 6.7 6.5  ALBUMIN 3.7 3.2*   Recent Labs    10/10/17 0730  LIPASE 46  AMYLASE 50   No results for input(s): AMMONIA in the last 8760 hours. CBC: Recent Labs    06/30/18 1855 07/01/18 0535 07/04/18 0700  WBC 12.7* 12.4* 6.8  NEUTROABS 11.4* 10.3* 4.8  HGB 11.5* 10.9* 11.0*  HCT 35.9* 34.7* 34.2*  MCV 90.9 92.3 91.7  PLT 188 150 190   Cardiac Enzymes: Recent Labs    11/13/17 1212 11/13/17 1455  TROPONINI <0.03 <0.03   BNP: Invalid input(s): POCBNP Lab Results  Component Value Date   HGBA1C 5.2 05/30/2018   Lab Results  Component Value Date   TSH 1.030 09/11/2017   No results found for: VITAMINB12 No results found for: FOLATE Lab Results  Component Value Date   IRON 17 (L) 05/02/2018   TIBC 311 05/02/2018   FERRITIN 8 (L) 05/02/2018    Imaging and Procedures obtained prior to SNF admission: No results found.  Assessment/Plan  Aspiration Pneumonia  Finishing course of Augmentin Afebrile. WBC Normals Seen by Speech and now on Modified Diet Also finishing Prednisone for 3 more days Acute Metabolic Encephalopathy Mental status back to baseline. Hypokalemia Potassium supplemented Repeat BMP Anemia with Occult Positive  Discussion with her son patient is not a candidate for any aggressive work-up We will decrease her iron to QD Continue Protonix twice daily Hgb Stabe Hyperglycemia Patient has a history of diabetes. Her blood sugars always  have been less than 100 Last A1C was 5.2 Not on any Hypoglycemic   Essential hypertension Patient was taken off her antihypertensives as her blood pressure was running low .  blood pressure has been stable.  Age-related osteoporosis Patient on Prolia and tolerating it well  Esophageal dysmotility She is stable on Prilosec and Zofran before  meals  GERD She did not tolerate daily Prilosec so it was changed to twice daily Depression Patient stays on Remeron and Celexa And her weght is stable Dementia Stable on Namenda Family/ staff Communication:   Labs/tests ordered:

## 2018-07-05 LAB — CULTURE, BLOOD (ROUTINE X 2)
Culture: NO GROWTH
Culture: NO GROWTH
SPECIAL REQUESTS: ADEQUATE
SPECIAL REQUESTS: ADEQUATE

## 2018-07-11 ENCOUNTER — Encounter (HOSPITAL_COMMUNITY)
Admission: RE | Admit: 2018-07-11 | Discharge: 2018-07-11 | Disposition: A | Discharge status: Home / Self Care | Payer: Medicare Other | Source: Skilled Nursing Facility | Attending: Internal Medicine | Admitting: Internal Medicine

## 2018-07-11 DIAGNOSIS — R7889 Finding of other specified substances, not normally found in blood: Secondary | ICD-10-CM | POA: Insufficient documentation

## 2018-07-11 DIAGNOSIS — R6889 Other general symptoms and signs: Secondary | ICD-10-CM | POA: Diagnosis present

## 2018-07-11 LAB — CBC WITH DIFFERENTIAL/PLATELET
Basophils Absolute: 0 10*3/uL (ref 0.0–0.1)
Basophils Relative: 0 %
EOS ABS: 0.2 10*3/uL (ref 0.0–0.7)
EOS PCT: 2 %
HCT: 39.3 % (ref 36.0–46.0)
Hemoglobin: 12.9 g/dL (ref 12.0–15.0)
LYMPHS ABS: 2.1 10*3/uL (ref 0.7–4.0)
Lymphocytes Relative: 28 %
MCH: 29.9 pg (ref 26.0–34.0)
MCHC: 32.8 g/dL (ref 30.0–36.0)
MCV: 91.2 fL (ref 78.0–100.0)
MONOS PCT: 11 %
Monocytes Absolute: 0.8 10*3/uL (ref 0.1–1.0)
Neutro Abs: 4.4 10*3/uL (ref 1.7–7.7)
Neutrophils Relative %: 59 %
PLATELETS: 298 10*3/uL (ref 150–400)
RBC: 4.31 MIL/uL (ref 3.87–5.11)
RDW: 19.4 % — AB (ref 11.5–15.5)
WBC: 7.5 10*3/uL (ref 4.0–10.5)

## 2018-07-11 LAB — BASIC METABOLIC PANEL
Anion gap: 9 (ref 5–15)
BUN: 8 mg/dL (ref 8–23)
CALCIUM: 8.6 mg/dL — AB (ref 8.9–10.3)
CO2: 24 mmol/L (ref 22–32)
Chloride: 108 mmol/L (ref 98–111)
Creatinine, Ser: 0.65 mg/dL (ref 0.44–1.00)
GFR calc Af Amer: >60 mL/min (ref >60)
GLUCOSE: 104 mg/dL — AB (ref 70–99)
POTASSIUM: 4 mmol/L (ref 3.5–5.1)
Sodium: 141 mmol/L (ref 135–145)

## 2018-07-16 DEATH — deceased

## 2018-07-23 ENCOUNTER — Encounter: Payer: Self-pay | Admitting: Internal Medicine

## 2018-07-23 ENCOUNTER — Non-Acute Institutional Stay (SKILLED_NURSING_FACILITY): Payer: Medicare Other

## 2018-07-23 ENCOUNTER — Non-Acute Institutional Stay (SKILLED_NURSING_FACILITY): Payer: Medicare Other | Admitting: Internal Medicine

## 2018-07-23 DIAGNOSIS — A419 Sepsis, unspecified organism: Secondary | ICD-10-CM

## 2018-07-23 DIAGNOSIS — D509 Iron deficiency anemia, unspecified: Secondary | ICD-10-CM | POA: Diagnosis not present

## 2018-07-23 DIAGNOSIS — F0390 Unspecified dementia without behavioral disturbance: Secondary | ICD-10-CM

## 2018-07-23 DIAGNOSIS — J189 Pneumonia, unspecified organism: Secondary | ICD-10-CM

## 2018-07-23 DIAGNOSIS — Z0000 Encounter for general adult medical examination without abnormal findings: Secondary | ICD-10-CM

## 2018-07-23 DIAGNOSIS — I10 Essential (primary) hypertension: Secondary | ICD-10-CM

## 2018-07-23 DIAGNOSIS — K224 Dyskinesia of esophagus: Secondary | ICD-10-CM

## 2018-07-23 NOTE — Patient Instructions (Signed)
Sabrina [name] , Thank you for taking time to come for your Medicare Wellness Visit. I appreciate your ongoing commitment to your health goals. Please review the following plan we discussed and let me know if I can assist you in the future.   Screening recommendations/referrals: Colonoscopy excluded, over age 82 Mammogram excluded, over age 81  Bone Density up to dtae Recommended yearly ophthalmology/optometry visit for glaucoma screening and checkup Recommended yearly dental visit for hygiene and checkup  Vaccinations: Influenza vaccine due, will receive at [facility] Pneumococcal vaccine up to date, completed Tdap vaccine up to date, due 07/13/2027 Shingles vaccine not in past records    Advanced directives: in chart  Conditions/risks identified: none  Next appointment: Dr. Chales Abrahams makes rounds   Preventive Care 65 Years and Older, Female Preventive care refers to lifestyle choices and visits with your health care provider that can promote health and wellness. What does preventive care include?  A yearly physical exam. This is also called an annual well check.  Dental exams once or twice a year.  Routine eye exams. Ask your health care provider how often you should have your eyes checked.  Personal lifestyle choices, including:  Daily care of your teeth and gums.  Regular physical activity.  Eating a healthy diet.  Avoiding tobacco and drug use.  Limiting alcohol use.  Practicing safe sex.  Taking low-dose aspirin every day.  Taking vitamin and mineral supplements as recommended by your health care provider. What happens during an annual well check? The services and screenings done by your health care provider during your annual well check will depend on your age, overall health, lifestyle risk factors, and family history of disease. Counseling  Your health care provider may ask you questions about your:  Alcohol use.  Tobacco use.  Drug use.  Emotional  well-being.  Home and relationship well-being.  Sexual activity.  Eating habits.  History of falls.  Memory and ability to understand (cognition).  Work and work Astronomer.  Reproductive health. Screening  You may have the following tests or measurements:  Height, weight, and BMI.  Blood pressure.  Lipid and cholesterol levels. These may be checked every 5 years, or more frequently if you are over 30 years old.  Skin check.  Lung cancer screening. You may have this screening every year starting at age 78 if you have a 30-pack-year history of smoking and currently smoke or have quit within the past 15 years.  Fecal occult blood test (FOBT) of the stool. You may have this test every year starting at age 1.  Flexible sigmoidoscopy or colonoscopy. You may have a sigmoidoscopy every 5 years or a colonoscopy every 10 years starting at age 79.  Hepatitis C blood test.  Hepatitis B blood test.  Sexually transmitted disease (STD) testing.  Diabetes screening. This is done by checking your blood sugar (glucose) after you have not eaten for a while (fasting). You may have this done every 1-3 years.  Bone density scan. This is done to screen for osteoporosis. You may have this done starting at age 46.  Mammogram. This may be done every 1-2 years. Talk to your health care provider about how often you should have regular mammograms. Talk with your health care provider about your test results, treatment options, and if necessary, the need for more tests. Vaccines  Your health care provider may recommend certain vaccines, such as:  Influenza vaccine. This is recommended every year.  Tetanus, diphtheria, and acellular pertussis (Tdap, Td)  vaccine. You may need a Td booster every 10 years.  Zoster vaccine. You may need this after age 42.  Pneumococcal 13-valent conjugate (PCV13) vaccine. One dose is recommended after age 4.  Pneumococcal polysaccharide (PPSV23) vaccine. One  dose is recommended after age 2. Talk to your health care provider about which screenings and vaccines you need and how often you need them. This information is not intended to replace advice given to you by your health care provider. Make sure you discuss any questions you have with your health care provider. Document Released: 10/29/2015 Document Revised: 06/21/2016 Document Reviewed: 08/03/2015 Elsevier Interactive Patient Education  2017 [employer] Prevention in the Home Falls can cause injuries. They can happen to people of all ages. There are many things you can do to make your home safe and to help prevent falls. What can I do on the outside of my home?  Regularly fix the edges of walkways and driveways and fix any cracks.  Remove anything that might make you trip as you walk through a door, such as a raised step or threshold.  Trim any bushes or trees on the path to your home.  Use bright outdoor lighting.  Clear any walking paths of anything that might make someone trip, such as rocks or tools.  Regularly check to see if handrails are loose or broken. Make sure that both sides of any steps have handrails.  Any raised decks and porches should have guardrails on the edges.  Have any leaves, snow, or ice cleared regularly.  Use sand or salt on walking paths during winter.  Clean up any spills in your garage right away. This includes oil or grease spills. What can I do in the bathroom?  Use night lights.  Install grab bars by the toilet and in the tub and shower. Do not use towel bars as grab bars.  Use non-skid mats or decals in the tub or shower.  If you need to sit down in the shower, use a plastic, non-slip stool.  Keep the floor dry. Clean up any water that spills on the floor as soon as it happens.  Remove soap buildup in the tub or shower regularly.  Attach bath mats securely with double-sided non-slip rug tape.  Do not have throw rugs and other  things on the floor that can make you trip. What can I do in the bedroom?  Use night lights.  Make sure that you have a light by your bed that is easy to reach.  Do not use any sheets or blankets that are too big for your bed. They should not hang down onto the floor.  Have a firm chair that has side arms. You can use this for support while you get dressed.  Do not have throw rugs and other things on the floor that can make you trip. What can I do in the kitchen?  Clean up any spills right away.  Avoid walking on wet floors.  Keep items that you use a lot in easy-to-reach places.  If you need to reach something above you, use a strong step stool that has a grab bar.  Keep electrical cords out of the way.  Do not use floor polish or wax that makes floors slippery. If you must use wax, use non-skid floor wax.  Do not have throw rugs and other things on the floor that can make you trip. What can I do with my stairs?  Do not leave  any items on the stairs.  Make sure that there are handrails on both sides of the stairs and use them. Fix handrails that are broken or loose. Make sure that handrails are as long as the stairways.  Check any carpeting to make sure that it is firmly attached to the stairs. Fix any carpet that is loose or worn.  Avoid having throw rugs at the top or bottom of the stairs. If you do have throw rugs, attach them to the floor with carpet tape.  Make sure that you have a light switch at the top of the stairs and the bottom of the stairs. If you do not have them, ask someone to add them for you. What else can I do to help prevent falls?  Wear shoes that:  Do not have high heels.  Have rubber bottoms.  Are comfortable and fit you well.  Are closed at the toe. Do not wear sandals.  If you use a stepladder:  Make sure that it is fully opened. Do not climb a closed stepladder.  Make sure that both sides of the stepladder are locked into place.  Ask  someone to hold it for you, if possible.  Clearly mark and make sure that you can see:  Any grab bars or handrails.  First and last steps.  Where the edge of each step is.  Use tools that help you move around (mobility aids) if they are needed. These include:  Canes.  Walkers.  Scooters.  Crutches.  Turn on the lights when you go into a dark area. Replace any light bulbs as soon as they burn out.  Set up your furniture so you have a clear path. Avoid moving your furniture around.  If any of your floors are uneven, fix them.  If there are any pets around you, be aware of where they are.  Review your medicines with your doctor. Some medicines can make you feel dizzy. This can increase your chance of falling. Ask your doctor what other things that you can do to help prevent falls. This information is not intended to replace advice given to you by your health care provider. Make sure you discuss any questions you have with your health care provider. Document Released: 07/29/2009 Document Revised: 03/09/2016 Document Reviewed: 11/06/2014 Elsevier Interactive Patient Education  2017 [employer].

## 2018-07-23 NOTE — Progress Notes (Signed)
This is a routine visit.  Level care skilled.  Facility is [facility].  Chief complaint- routine visit for medical management of chronic medical conditions including dementia- history of recurrent vomiting with GERD- anemia- hypertension-osteoporosis- recent history of pneumonia  History of present illness.  Patient is a pleasant resident who is a long-term resident of facility.  Her medical diagnoses are as noted above- she appears to be relatively stable.  She was hospitalized last month for lethargy and hypoxia-she was found to have a left lower lobe infiltrate in the hospital and was treated with IV antibiotics switched to Augmentin on discharge she also finished a course of prednisone.  She is also seen by speech therapy and is now on a dysphagia 2 diet with honey thick liquids.  Clinically she appears to be stabilized in this regards she has lost some weight but per nursing she is now eating a bit better I suspect the acute pneumonia took a while to resolve and affected her appetite but this will have to be watched she is on Remeron.  She does have a history of esophageal dysmotility with recurrent vomiting but this is stabilized significantly with Zofran before meals she is also on Prilosec twice a day.  In the hospital she was noted to have some mild hypokalemia and this was supplemented and normalized quickly-   She also has a history of anemia with some occult positive stools in the past- this was discussed with her son and she is not thought to be a good candidate for an aggressive work-up at this point we have been monitoring her hemoglobin she is on a proton pump inhibitor and she is on iron this was actually reduced recently because of her stability of hemoglobin- hemoglobin actually showed improvement at 12.9 on lab done late last month  She also at one time was thought to have hyperglycemia but hemoglobin A1c was 5.2-blood sugars were unremarkable  .  Regards to  hypertension she is no longer on any medication because of some hypotensive readings I got a manual blood pressure 128/80 today I see previous systolics in the 130s it appears she does not really need to be on any medication  She also has a diagnosis of osteoporosis--has a history of a humerus fracture as well as femur fracture in the past-- is on Prolia and is tolerating this well.  Regards to dementia she continues on Namenda and again does well with supportive care there have been no behaviors noted continues to be pleasant confused at times but very agreeable.  Currently she is sitting in her wheelchair comfortably which she mainly does- does not have any complaints she is a poor historian secondary to dementia but appears to be doing well and again nursing staff did not report any recent issues  Past Medical History:  Diagnosis Date  . Asthma   . Dementia   . Diabetes mellitus   . H. pylori infection MAR 2014  . Hypertension   . Stroke [facility])         Past Surgical History:  Procedure Laterality Date  . CHOLECYSTECTOMY    . COLECTOMY     secondary to diverticulitis  . ESOPHAGOGASTRODUODENOSCOPY (EGD) WITH ESOPHAGEAL DILATION N/A 01/07/2013   [id number] ring was found at the gastroesophageal junction/SINGLE DUODENAL DIVERTICULUM    reports that she has never smoked. She has never used smokeless tobacco. She reports that she does not drink alcohol or use drugs. Social History  Socioeconomic History  . Marital status: Married    Spouse name: Not on file  . Number of children: Not on file  . Years of education: Not on file  . Highest education level: Not on file  Occupational History  . Not on file  Social Needs  . Financial resource strain: Not on file  . Food insecurity:    Worry: Not on file    Inability: Not on file  . Transportation needs:    Medical: Not on file    Non-medical: Not on file  Tobacco Use  . Smoking status: Never Smoker    . Smokeless tobacco: Never Used  Substance and Sexual Activity  . Alcohol use: No  . Drug use: No  . Sexual activity: Never  Lifestyle  . Physical activity:    Days per week: Not on file    Minutes per session: Not on file  . Stress: Not on file  Relationships  . Social connections:    Talks on phone: Not on file    Gets together: Not on file    Attends religious service: Not on file    Active member of club or organization: Not on file    Attends meetings of clubs or organizations: Not on file    Relationship status: Not on file  . Intimate partner violence:    Fear of current or ex partner: Not on file    Emotionally abused: Not on file    Physically abused: Not on file    Forced sexual activity: Not on file  Other Topics Concern  . Not on file  Social History Narrative  . Not on file    Functional Status Survey:       Family History  Problem Relation Age of Onset  . Colon cancer Neg Hx         Health Maintenance  Topic Date Due  . OPHTHALMOLOGY EXAM  08/02/2018 (Originally 04/05/2018)  . URINE MICROALBUMIN  08/02/2018 (Originally 06/03/1942)  . HEMOGLOBIN A1C  11/30/2018  . FOOT EXAM  01/24/2019  . TETANUS/TDAP  07/13/2027  . INFLUENZA VACCINE  Completed  . DEXA SCAN  Completed  . PNA vac Low Risk Adult  Completed    No Known Allergies      Outpatient Encounter Medications as of 07/04/2018  Medication Sig  .     Marland Kitchen Balsam [place]-Castor Oil (VENELEX) OINT Apply to sacrum and bilateral buttocks q shift & prn every shift  . Calcium Carbonate-Vitamin D (CALCIUM 500/VITAMIN D PO) Take 500 mg by mouth 2 (two) times daily.  . citalopram (CELEXA) 20 MG tablet Take 20 mg by mouth daily.   Marland Kitchen denosumab (PROLIA) 60 MG/ML SOLN injection Inject 60 mg into the skin every 6 (six) months. Administer in upper arm, thigh, or abdomen  . Ferrous Sulfate (IRON) 325 (65 Fe) MG TABS Take 325 mg by mouth QD  .     Marland Kitchen ipratropium (ATROVENT) 0.02 %  nebulizer solution Take 0.5 mg by nebulization every 4 (four) hours as needed for wheezing or shortness of breath.  . loratadine (CLARITIN) 10 MG tablet Take 10 mg by mouth daily.  . memantine (NAMENDA) 10 MG tablet Take 10 mg by mouth 2 (two) times daily.  . Menthol, Topical Analgesic, (BIOFREEZE) 4 % GEL Apply 1 application topically daily as needed (for knee pain management). Apply prn to knees for pain management as needed daily   . mirtazapine (REMERON) 15 MG tablet Take 15 mg by mouth at  bedtime.  Marland Kitchen nystatin cream (MYCOSTATIN) Apply 1 application topically 3 (three) times daily.  Marland Kitchen omeprazole (PRILOSEC) 40 MG capsule Take 40 mg by mouth 2 (two) times daily.   . ondansetron (ZOFRAN) 4 MG tablet Take 4 mg by mouth 3 (three) times daily before meals.   .     .     .     No facility-administered encounter medications on file as of 07/04/2018.    Review of systems this is limited secondary to dementia provided by nursing as well.  In general no complaints of fever chills she has lost about 4 to 5 pounds since before hospitalization but per nursing is now eating better.  Head ears eyes nose mouth and throat is not complaining of any visual changes or sore throat.  Respiratory denies cough or shortness of breath nursing staff says this has improved.  Cardiac is not complaining of chest pain or edema.  GI does not complain of abdominal discomfort difficulty swallowing diarrhea or constipation.  GU does not complain of dysuria.  Musculoskeletal she is not complaining of any joint pain today.  Neurologic is not complaint dizziness headache or syncope.  Psych does have a history of dementia and depression but appears stable on Namenda as well his Celexa.  She does not appear overtly depressed or anxious today.  Physical exam.  She is afebrile pulse of 80 respirations 16 blood pressure 128/80 manually weight is 119 pounds past summer she was more in the lower mid 120s.  In  general this is a very pleasant fairly well-nourished elderly female in no distress sitting comfortably in her wheelchair.  Her skin is warm and dry on the left cheek she does have a brownish hyperpigmented area-  Eyes very visual acuity appears to be intact-sclera and conjunctive are clear.  Oropharynx is clear mucous membranes moist.  Chest is clear to auscultation there is no labored breathing.  Heart is regular rate and rhythm with an occasional irregular beat she does not really have significant lower extremity edema  Abdomen is soft nontender with positive bowel sounds.  Musculoskeletal does move all extremities x4 at baseline with some baseline lower extremity weakness I could not see any changes other than baseline arthritic.  Neurologic is grossly intact her speech is clear she is alert could not really appreciate true lateralizing findings.  Psych she is oriented to self answers simple questions appropriately follow simple verbal commands without difficulty continues to have a pleasant disposition  Labs.  July 11, 2018.  Sodium 141 potassium 4 BUN 8 creatinine 0.65.  WBC 7.5 hemoglobin 12.9 platelets 298  June 30, 2018.  Liver function test within normal limits except albumin of 3.2  Assessment and plan.  1.  History of sepsis with pneumonia-she is made a nice recovery from this continues on duo nebs as needed has completed her antibiotics and prednisone as well as Mucinex.  2.  History of esophageal dysmotility with GERD-and recurrent vomiting-this has improved significantly on the routine Zofran before meals as well as Prilosec twice daily.  3.-  History of dementia she appears to do well with supportive care she is on Namenda-she is also on Celexa for coexistent depression-and this appears stable as well.  4.-  History of hypertension- she is not on any medications as noted above nonetheless blood pressures appear to be stable manual blood pressure today  128/80 appears to be relatively baseline.  5.-  History of anemia per discussion above this was discussed with  her son with no aggressive work-up at this time --she is on iron which was reduced to daily secondary to stability of her hemoglobin-hemoglobin done late last month actually shows improvement at 12.9  #6- history of hypokalemia this was quite mild and quickly normalized with supplementation-  7.  History of hyperglycemia again hemoglobin A1c was 5.2 blood sugars in the past have been quite unremarkable.  8.-History of osteoporosis with previous history of humerus and femur fractures- she appears to be tolerating the Prolia well- does not report any swallowing or dysphasia issues with the medication. She also continues on calcium with vitamin D   9.  History of weight loss this thought possibly due to the acute episode with pneumonia-apparently she is eating somewhat better at this point continue to monitor weights-she is on Remeron which should help with appetite stimulation possibly.  10- history of allergic rhinitis-this appears to be stable she is on Claritin routinely and has tolerated this well.  11.  History of arthritic discomfort she does have an order for Biofreeze currently when needed this does help as well-  12 history of left cheek hyperpigmentation-again this is somewhat of a light brown color apparently dermatology consult was pending but then she went to the hospital with pneumonia- nursing will readdress this with family about follow-up by dermatology   [phone] note greater than 35 minutes spent assessing patient-reviewing her chart and labs-discussing her status with nursing staff- coordinating and formulating a plan of care for numerous diagnoses- of note greater than 50% of time spent coordinating a plan of care

## 2018-07-23 NOTE — Progress Notes (Signed)
Subjective:   Sabrina [name] is a 84 y.o. female who presents for Medicare Annual (Subsequent) preventive examination at [facility]  Last AWV-S    Objective:     Vitals: BP 134/68 (BP Location: Left Arm, Patient Position: Sitting)   Pulse 68   Temp 98.6 F (37 C) (Oral)   Ht 5\' 5"  (1.651 m)   Wt 128 lb (58.1 kg)   BMI 21.30 kg/m   Body mass index is 21.3 kg/m.  Advanced Directives 07/23/2018 07/03/2018 07/01/2018 06/30/2018 05/09/2018 05/01/2018 04/26/2018  Does Patient Have a Medical Advance Directive? Yes Yes No No Yes Yes Yes  Type of Advance Directive (No Data) (No Data) (No Data) - (No Data) (No Data) (No Data)  Does patient want to make changes to medical advance directive? No - Patient declined No - Patient declined No - Patient declined - No - Patient declined No - Patient declined No - Patient declined  Copy of Healthcare Power of Attorney in Chart? No - copy requested No - copy requested No - copy requested - No - copy requested No - copy requested No - copy requested  Would patient like information on creating a medical advance directive? No - Patient declined No - Patient declined No - Patient declined No - Patient declined - - -  Pre-existing out of facility DNR order (yellow form or pink MOST form) - - - - - - -    Tobacco Social History   Tobacco Use  Smoking Status Never Smoker  Smokeless Tobacco Never Used     Counseling given: Not Answered   Clinical Intake:  Pre-visit preparation completed: No  Pain : No/denies pain     Diabetes: No  How often do you need to have someone help you when you read instructions, pamphlets, or other written materials from your doctor or pharmacy?: 4 - Often  Interpreter Needed?: No  Information entered by :: Tyron Russell, RN  Past Medical History:  Diagnosis Date  . Asthma   . Dementia (HCC)   . Diabetes mellitus   . H. pylori infection MAR 2014  . Hypertension   . Stroke [facility])    Past  Surgical History:  Procedure Laterality Date  . CHOLECYSTECTOMY    . COLECTOMY     secondary to diverticulitis  . ESOPHAGOGASTRODUODENOSCOPY (EGD) WITH ESOPHAGEAL DILATION N/A 01/07/2013   [id number] ring was found at the gastroesophageal junction/SINGLE DUODENAL DIVERTICULUM   Family History  Problem Relation Age of Onset  . Colon cancer Neg Hx    Social History   Socioeconomic History  . Marital status: Married    Spouse name: Not on file  . Number of children: Not on file  . Years of education: Not on file  . Highest education level: Not on file  Occupational History  . Not on file  Social Needs  . Financial resource strain: Not hard at all  . Food insecurity:    Worry: Never true    Inability: Never true  . Transportation needs:    Medical: No    Non-medical: No  Tobacco Use  . Smoking status: Never Smoker  . Smokeless tobacco: Never Used  Substance and Sexual Activity  . Alcohol use: No  . Drug use: No  . Sexual activity: Never  Lifestyle  . Physical activity:    Days per week: 0 days    Minutes per session: 0 min  . Stress: To some extent  Relationships  .  Social connections:    Talks on phone: Never    Gets together: Never    Attends religious service: Never    Active member of club or organization: No    Attends meetings of clubs or organizations: Never    Relationship status: Widowed  Other Topics Concern  . Not on file  Social History Narrative  . Not on file    Outpatient Encounter Medications as of 07/23/2018  Medication Sig  . amoxicillin-clavulanate (AUGMENTIN) 875-125 MG tablet Take 1 tablet by mouth every 12 (twelve) hours.  [name] [place]-Castor Oil (VENELEX) OINT Apply to sacrum and bilateral buttocks q shift & prn every shift  . Calcium Carbonate-Vitamin D (CALCIUM 500/VITAMIN D PO) Take 500 mg by mouth 2 (two) times daily.  . citalopram (CELEXA) 20 MG tablet Take 20 mg by mouth daily.   Marland Kitchen denosumab (PROLIA) 60 MG/ML SOLN injection  Inject 60 mg into the skin every 6 (six) months. Administer in upper arm, thigh, or abdomen  . Ferrous Sulfate (IRON) 325 (65 Fe) MG TABS Take 325 mg by mouth 2 (two) times daily.   Marland Kitchen guaiFENesin (MUCINEX) 600 MG 12 hr tablet Take 600 mg by mouth 2 (two) times daily.  Marland Kitchen ipratropium (ATROVENT) 0.02 % nebulizer solution Take 0.5 mg by nebulization every 4 (four) hours as needed for wheezing or shortness of breath.  . loratadine (CLARITIN) 10 MG tablet Take 10 mg by mouth daily.  . memantine (NAMENDA) 10 MG tablet Take 10 mg by mouth 2 (two) times daily.  . Menthol, Topical Analgesic, (BIOFREEZE) 4 % GEL Apply 1 application topically daily as needed (for knee pain management). Apply prn to knees for pain management as needed daily   . mirtazapine (REMERON) 15 MG tablet Take 15 mg by mouth at bedtime.  Marland Kitchen nystatin cream (MYCOSTATIN) Apply 1 application topically 3 (three) times daily.  Marland Kitchen omeprazole (PRILOSEC) 40 MG capsule Take 40 mg by mouth 2 (two) times daily.   . ondansetron (ZOFRAN) 4 MG tablet Take 4 mg by mouth 3 (three) times daily before meals.   . potassium chloride SA (K-DUR,KLOR-CON) 20 MEQ tablet Take 20 mEq by mouth daily.  . predniSONE (DELTASONE) 20 MG tablet Take 2 tablets (40 mg total) by mouth daily with breakfast. X 3 days  . Probiotic Product (RISA-BID PROBIOTIC) TABS Take 1 tablet by mouth 2 (two) times daily. 10 day course starting on 06/29/2018   No facility-administered encounter medications on file as of 07/23/2018.     Activities of Daily Living In your present state of health, do you have any difficulty performing the following activities: 07/23/2018 07/01/2018  Hearing? N N  Vision? N N  Difficulty concentrating or making decisions? [name]  Walking or climbing stairs? Y Y  Dressing or bathing? Y Y  Doing errands, shopping? Y N  Preparing Food and eating ? Y -  Using the Toilet? Y -  In the past six months, have you accidently leaked urine? Y -  Do you have problems with  loss of bowel control? Y -  Managing your Medications? Y -  Managing your Finances? Y -  Housekeeping or managing your Housekeeping? Y -  Some recent data might be hidden    Patient Care Team: [name], MD as PCP - General (Internal Medicine) Synetta Shadow as Physician Assistant (Internal Medicine)    Assessment:   This is a routine wellness examination for Walterine.  Exercise Activities and Dietary recommendations Current Exercise  Habits: The patient does not participate in regular exercise at present, Exercise limited by: neurologic condition(s);orthopedic condition(s)  Goals   None     Fall Risk Fall Risk  07/23/2018 07/12/2017  Falls in the past year? No No   Is the patient's home free of loose throw rugs in walkways, pet beds, electrical cords, etc?   yes      Grab bars in the bathroom? yes      Handrails on the stairs?   yes      Adequate lighting?   yes  Depression Screen PHQ 2/9 Scores 07/23/2018 07/12/2017  PHQ - 2 Score 0 3  PHQ- 9 Score - 11     Cognitive Function     6CIT Screen 07/23/2018 07/12/2017  What Year? 0 points 4 points  What month? 0 points 3 points  What time? 0 points 0 points  Count back from 20 0 points 0 points  Months in reverse 4 points 0 points  Repeat phrase 6 points 10 points  Total Score 10 17    Immunization History  Administered Date(s) Administered  . Influenza, High Dose Seasonal PF 07/15/2018  . Influenza-Unspecified 07/20/2016, 07/20/2017  . Pneumococcal Conjugate-13 06/12/2017  . Pneumococcal-Unspecified 07/24/2016  . Tdap 07/12/2017    Qualifies for Shingles Vaccine? Not in past records  Screening Tests Health Maintenance  Topic Date Due  . OPHTHALMOLOGY EXAM  08/02/2018 (Originally 04/05/2018)  . URINE MICROALBUMIN  08/02/2018 (Originally 06/03/1942)  . HEMOGLOBIN A1C  11/30/2018  . FOOT EXAM  01/24/2019  . TETANUS/TDAP  07/13/2027  . INFLUENZA VACCINE  Completed  . DEXA SCAN  Completed  . PNA vac  Low Risk Adult  Completed    Cancer Screenings: Lung: Low Dose CT Chest recommended if Age 13-80 years, 30 pack-year currently smoking OR have quit w/in 15years. Patient does not qualify. Breast:  Up to date on Mammogram? Yes   Up to date of Bone Density/Dexa? Yes Colorectal: up to date  Additional Screenings:  Hepatitis C Screening: declined Flu vaccine due: will [facility]    Plan:    I have personally reviewed and addressed the Medicare Annual Wellness questionnaire and have noted the following in the patient's chart:  A. Medical and social history B. Use of alcohol, tobacco or illicit drugs  C. Current medications and supplements D. Functional ability and status E.  Nutritional status F.  Physical activity G. Advance directives H. List of other physicians I.  Hospitalizations, surgeries, and ER visits in previous 12 months J.  Vitals K. Screenings to include hearing, vision, cognitive, depression L. Referrals and appointments - none  In addition, I have reviewed and discussed with patient certain preventive protocols, quality metrics, and best practice recommendations. A written personalized care plan for preventive services as well as general preventive health recommendations were provided to patient.  See attached scanned questionnaire for additional information.   Signed,   Tyron Russell, RN Nurse Health Advisor  Patient Concerns: None

## 2018-08-23 ENCOUNTER — Non-Acute Institutional Stay (SKILLED_NURSING_FACILITY): Payer: Medicare Other | Admitting: Internal Medicine

## 2018-08-23 ENCOUNTER — Encounter: Payer: Self-pay | Admitting: Internal Medicine

## 2018-08-23 DIAGNOSIS — B370 Candidal stomatitis: Secondary | ICD-10-CM

## 2018-08-23 NOTE — Progress Notes (Signed)
Location:    [facility] Room Number: 115/W Place of Service:  SNF [phone]) Provider:  Sabino Dick, MD  Patient Care Team: [name], MD as PCP - General (Internal Medicine) Synetta Shadow as Physician Assistant (Internal Medicine)  Extended Emergency Contact Information Primary Emergency Contact: [name] [place] Home Phone: [phone] Relation: Son Secondary Emergency Contact: [name] [place] Home Phone: [phone] Mobile Phone: [phone] Relation: Son  Code Status:  Full Code Goals of care: Advanced Directive information Advanced Directives 08/23/2018  Does Patient Have a Medical Advance Directive? Yes  Type of Advance Directive (No Data)  Does patient want to make changes to medical advance directive? No - Patient declined  Copy of Healthcare Power of Attorney in Chart? No - copy requested  Would patient like information on creating a medical advance directive? No - Patient declined  Pre-existing out of facility DNR order (yellow form or pink MOST form) -     Chief Complaint  Patient presents with  . Acute Visit    Thrush    HPI:  Pt is a 84 y.o. female seen today for an acute visit for suspected thrush.  Patient is a long-term resident of facility with a history of dementia as well as GERD with recurrent vomiting which is largely resolved as well as anemia hypertension osteoporosis.  She was noted by speech therapy today to have a film on her tongue and I am following up on this.  Patient is denying any sore throat or difficulty swallowing   Past Medical History:  Diagnosis Date  . Asthma   . Dementia (HCC)   . Diabetes mellitus   . H. pylori infection MAR 2014  . Hypertension   . Stroke [facility])    Past Surgical History:  Procedure Laterality Date  . CHOLECYSTECTOMY    . COLECTOMY     secondary to diverticulitis  . ESOPHAGOGASTRODUODENOSCOPY (EGD) WITH  ESOPHAGEAL DILATION N/A 01/07/2013   QIO:NGEXBMWU ring was found at the gastroesophageal junction/SINGLE DUODENAL DIVERTICULUM    No Known Allergies  Outpatient Encounter Medications as of 08/23/2018  Medication Sig  . Balsam [place]-Castor Oil (VENELEX) OINT Apply to sacrum and bilateral buttocks q shift & prn every shift  . Calcium Carbonate-Vitamin D (CALCIUM 500/VITAMIN D PO) Take 500 mg by mouth 2 (two) times daily.  . citalopram (CELEXA) 20 MG tablet Take 20 mg by mouth daily.   Marland Kitchen denosumab (PROLIA) 60 MG/ML SOLN injection Inject 60 mg into the skin every 6 (six) months. Administer in upper arm, thigh, or abdomen  . Ferrous Sulfate (IRON) 325 (65 Fe) MG TABS Take 325 mg by mouth every morning.   Marland Kitchen ipratropium (ATROVENT) 0.02 % nebulizer solution Take 0.5 mg by nebulization every 4 (four) hours as needed for wheezing or shortness of breath.  . loratadine (CLARITIN) 10 MG tablet Take 10 mg by mouth daily.  . memantine (NAMENDA) 10 MG tablet Take 10 mg by mouth 2 (two) times daily.  . Menthol, Topical Analgesic, (BIOFREEZE) 4 % GEL Apply 1 application topically daily as needed (for knee pain management). Apply prn to knees for pain management as needed daily   . mirtazapine (REMERON) 15 MG tablet Take 15 mg by mouth at bedtime.  Marland Kitchen nystatin cream (MYCOSTATIN) Apply 1 application topically 3 (three) times daily.  Marland Kitchen omeprazole (PRILOSEC) 40 MG capsule Take 40 mg by mouth 2 (two) times daily.   . ondansetron (  ZOFRAN) 4 MG tablet Take 4 mg by mouth 3 (three) times daily before meals.   . [DISCONTINUED] amoxicillin-clavulanate (AUGMENTIN) 875-125 MG tablet Take 1 tablet by mouth every 12 (twelve) hours. (Patient not taking: Reported on 07/23/2018)  . [DISCONTINUED] guaiFENesin (MUCINEX) 600 MG 12 hr tablet Take 600 mg by mouth 2 (two) times daily.  . [DISCONTINUED] potassium chloride SA (K-DUR,KLOR-CON) 20 MEQ tablet Take 20 mEq by mouth daily.  . [DISCONTINUED] predniSONE (DELTASONE) 20 MG tablet  Take 2 tablets (40 mg total) by mouth daily with breakfast. X 3 days (Patient not taking: Reported on 07/23/2018)  . [DISCONTINUED] Probiotic Product (RISA-BID PROBIOTIC) TABS Take 1 tablet by mouth 2 (two) times daily. 10 day course starting on 06/29/2018   No facility-administered encounter medications on file as of 08/23/2018.     Review of Systems   This is limited secondary to dementia but she is not complaining of any discomfort or difficulty swallowing or sore throat  Immunization History  Administered Date(s) Administered  . Influenza, High Dose Seasonal PF 07-05-18  . Influenza-Unspecified 07/20/2016, 07/20/2017  . Pneumococcal Conjugate-13 06/12/2017  . Pneumococcal-Unspecified 07/24/2016  . Tdap 07/12/2017   Pertinent  Health Maintenance Due  Topic Date Due  . OPHTHALMOLOGY EXAM  09/22/2018 (Originally 04/05/2018)  . URINE MICROALBUMIN  09/22/2018 (Originally 06/03/1942)  . HEMOGLOBIN A1C  11/30/2018  . FOOT EXAM  01/24/2019  . INFLUENZA VACCINE  Completed  . DEXA SCAN  Completed  . PNA vac Low Risk Adult  Completed   Fall Risk  07/23/2018 07/12/2017  Falls in the past year? No No   Functional Status Survey:      Physical Exam   In general this is a pleasant elderly female in no distress sitting comfortably in her wheelchair.  Her skin is warm and dry.  Oropharynx does appear to have a thin white film on her tongue mucous membranes are moist.  Psych she is pleasant oriented to self can carry on a very short conversation Labs reviewed: Recent Labs    11/16/17 0700  Jul 05, 2018 0514 07/04/18 0700 07/11/18 0700  NA  --    < > 142 144 141  K  --    < > 3.6 3.4* 4.0  CL  --    < > 112* 114* 108  CO2  --    < > 24 23 24   GLUCOSE  --    < > 131* 122* 104*  BUN  --    < > 11 8 8   CREATININE  --    < > 0.65 0.64 0.65  CALCIUM  --    < > 7.5* 8.4* 8.6*  MG 1.8  --   --   --   --   PHOS 3.9  --   --   --   --    < > = values in this interval not displayed.    Recent Labs    10/10/17 0830 06/30/18 1855  AST 19 23  ALT 9* 11  ALKPHOS 61 53  BILITOT 0.6 0.6  PROT 6.7 6.5  ALBUMIN 3.7 3.2*   Recent Labs    07/01/18 0535 07/04/18 0700 07/11/18 0700  WBC 12.4* 6.8 7.5  NEUTROABS 10.3* 4.8 4.4  HGB 10.9* 11.0* 12.9  HCT 34.7* 34.2* 39.3  MCV 92.3 91.7 91.2  PLT 150 190 298   Lab Results  Component Value Date   TSH 1.030 09/11/2017   Lab Results  Component Value Date   HGBA1C 5.2  05/30/2018   No results found for: CHOL, HDL, LDLCALC, LDLDIRECT, TRIG, CHOLHDL  Significant Diagnostic Results in last 30 days:  No results found.  Assessment/Plan  #1 suspected thrush- we will treat with nystatin for several days and then monitor for resolution-she does not appear to be having any discomfort with this.  [id number]

## 2018-08-28 ENCOUNTER — Non-Acute Institutional Stay (SKILLED_NURSING_FACILITY): Payer: Medicare Other | Admitting: Internal Medicine

## 2018-08-28 ENCOUNTER — Encounter: Payer: Self-pay | Admitting: Internal Medicine

## 2018-08-28 DIAGNOSIS — R112 Nausea with vomiting, unspecified: Secondary | ICD-10-CM | POA: Diagnosis not present

## 2018-08-28 DIAGNOSIS — D509 Iron deficiency anemia, unspecified: Secondary | ICD-10-CM | POA: Diagnosis not present

## 2018-08-28 DIAGNOSIS — M810 Age-related osteoporosis without current pathological fracture: Secondary | ICD-10-CM

## 2018-08-28 DIAGNOSIS — F0390 Unspecified dementia without behavioral disturbance: Secondary | ICD-10-CM

## 2018-08-28 DIAGNOSIS — I10 Essential (primary) hypertension: Secondary | ICD-10-CM

## 2018-08-28 NOTE — Progress Notes (Signed)
Location:    [facility] Room Number: 115/W Place of Service:  SNF [phone]) Provider:  Edmon Crape PA-C  [name], MD  Patient Care Team: [name], MD as PCP - General (Internal Medicine) Synetta Shadow as Physician Assistant (Internal Medicine)  Extended Emergency Contact Information Primary Emergency Contact: [name] [place] Home Phone: [phone] Relation: Son Secondary Emergency Contact: [name] [place] Home Phone: [phone] Mobile Phone: [phone] Relation: Son  Code Status:  Full Code Goals of care: Advanced Directive information Advanced Directives 08/28/2018  Does Patient Have a Medical Advance Directive? Yes  Type of Advance Directive (No Data)  Does patient want to make changes to medical advance directive? No - Patient declined  Copy of Healthcare Power of Attorney in Chart? No - copy requested  Would patient like information on creating a medical advance directive? No - Patient declined  Pre-existing out of facility DNR order (yellow form or pink MOST form) -     Chief complaint- routine visit for medical management of chronic medical conditions including history of dementia- recurrent vomiting with GERD-anemia-hypertension-osteoporosis- with previous history of femur and humerus fractures-allergic rhinitis-osteoarthritis- weight loss  HPI:  Pt is a 84 y.o. female seen today  For  management of chronic diseases.  As noted above.  She appears to be quite stable.  She was hospitalized back in September for pneumonia and did lose some weight but her weight appears to have stabilized apparently she is eating somewhat better.  Current weight is around 119 pounds which is stable with last month's weight.-- is on Remeron for appetite stimulation.    She does have a history of dementia but does well with supportive care she is on Namenda she is also on Celexa for coexistent  depression.  She also at one point had a significant history of recurrent vomiting with a history of GERD but she is done well with the Zofran before meals as well as Prilosec twice a day.  She also has a history of anemia with occult positive stools at times in the past- she is on a proton pump inhibitor as noted above she was on iron twice a day this was reduced to once a day and hemoglobin has shown stability she is not thought to be a candidate for any aggressive work-up and hemoglobin again has been stable will update this.  She does have a history of humerus and femur fracture in the past with osteoporosis she is on Prolia.  She was also seen last week for some mild thrush on her tongue she still has a remnant of this and will extend her nystatin a bit longer.  Currently she is sitting in her wheelchair continues to be very pleasant smiling- vital signs are stable she does not have any complaints   Past Medical History:  Diagnosis Date  . Asthma   . Dementia (HCC)   . Diabetes mellitus   . H. pylori infection MAR 2014  . Hypertension   . Stroke [facility])    Past Surgical History:  Procedure Laterality Date  . CHOLECYSTECTOMY    . COLECTOMY     secondary to diverticulitis  . ESOPHAGOGASTRODUODENOSCOPY (EGD) WITH ESOPHAGEAL DILATION N/A 01/07/2013   UYQ:[id number] ring was found at the gastroesophageal junction/SINGLE DUODENAL DIVERTICULUM    No Known Allergies  Outpatient Encounter Medications as of 08/28/2018  Medication Sig  . Balsam [place]-Castor Oil (VENELEX) OINT Apply to sacrum and bilateral  buttocks q shift & prn every shift  . Calcium Carbonate-Vitamin D (CALCIUM 500/VITAMIN D PO) Take 500 mg by mouth 2 (two) times daily.  . citalopram (CELEXA) 20 MG tablet Take 20 mg by mouth daily.   Marland Kitchen. denosumab (PROLIA) 60 MG/ML SOLN injection Inject 60 mg into the skin every 6 (six) months. Administer in upper arm, thigh, or abdomen  . Ferrous Sulfate (IRON) 325 (65 Fe) MG TABS Take 325  mg by mouth every morning.   Marland Kitchen. ipratropium (ATROVENT) 0.02 % nebulizer solution Take 0.5 mg by nebulization every 4 (four) hours as needed for wheezing or shortness of breath.  . loratadine (CLARITIN) 10 MG tablet Take 10 mg by mouth daily.  . memantine (NAMENDA) 10 MG tablet Take 10 mg by mouth 2 (two) times daily.  . Menthol, Topical Analgesic, (BIOFREEZE) 4 % GEL Apply 1 application topically daily as needed (for knee pain management). Apply prn to knees for pain management as needed daily   . mirtazapine (REMERON) 15 MG tablet Take 15 mg by mouth at bedtime.  Marland Kitchen. nystatin cream (MYCOSTATIN) Apply 1 application topically 3 (three) times daily.  Marland Kitchen. omeprazole (PRILOSEC) 40 MG capsule Take 40 mg by mouth 2 (two) times daily.   . ondansetron (ZOFRAN) 4 MG tablet Take 4 mg by mouth 3 (three) times daily before meals.    No facility-administered encounter medications on file as of 08/28/2018.      Review of Systems   This is largely limited secondary to dementia nursing does not report any issues she is not complaining of any pain or shortness of breath sore throat or difficulty swallowing  Immunization History  Administered Date(s) Administered  . Influenza, High Dose Seasonal PF 06/20/2018  . Influenza-Unspecified 07/20/2016, 07/20/2017  . Pneumococcal Conjugate-13 06/12/2017  . Pneumococcal-Unspecified 07/24/2016  . Tdap 07/12/2017   Pertinent  Health Maintenance Due  Topic Date Due  . OPHTHALMOLOGY EXAM  09/22/2018 (Originally 04/05/2018)  . URINE MICROALBUMIN  09/22/2018 (Originally 06/03/1942)  . HEMOGLOBIN A1C  11/30/2018  . FOOT EXAM  01/24/2019  . INFLUENZA VACCINE  Completed  . DEXA SCAN  Completed  . PNA vac Low Risk Adult  Completed   Fall Risk  07/23/2018 07/12/2017  Falls in the past year? No No   Functional Status Survey:    Vitals:   08/28/18 1433  BP: 128/71  Pulse: 76  Resp: 16  Temp: (!) 97.5 F (36.4 C)  TempSrc: Oral  SpO2: 94%  Weight: 119 lb 9.6 oz  (54.3 kg)  Height: 5\' 5"  (1.651 m)  Manual blood pressure was 132/62 Body mass index is 19.9 kg/m. Physical Exam   In general this is a very pleasant elderly female in no distress sitting comfortably in her wheelchair.  Her skin is warm and dry she continues to have a small scaly slightly darkened area on her left cheek.  Eyes visual acuity appears to be intact sclera and conjunctive are clear   Oropharynx mucous membranes are moist continues to have a slight white film on her tongue this appears to be improved from exam last week  Chest is clear to auscultation with somewhat shallow air entry there is no labored breathing  Heart is regular rate and rhythm with an occasional irregular beat without murmur gallop or rub she does not have significant lower extremity edema.  Her abdomen is soft nontender with positive bowel sounds.  Musculoskeletal moves all extremities x4 at baseline with baseline arthritic changes  Neurologic is grossly intact  her speech is clear could not really appreciate lateralizing findings cranial nerves appear to be intact.  Psych she is oriented to self pleasant and appropriate  Labs reviewed: Recent Labs    11/16/17 0700  2018-07-26 0514 07/04/18 0700 07/11/18 0700  NA  --    < > 142 144 141  K  --    < > 3.6 3.4* 4.0  CL  --    < > 112* 114* 108  CO2  --    < > 24 23 24   GLUCOSE  --    < > 131* 122* 104*  BUN  --    < > 11 8 8   CREATININE  --    < > 0.65 0.64 0.65  CALCIUM  --    < > 7.5* 8.4* 8.6*  MG 1.8  --   --   --   --   PHOS 3.9  --   --   --   --    < > = values in this interval not displayed.   Recent Labs    10/10/17 0830 06/30/18 1855  AST 19 23  ALT 9* 11  ALKPHOS 61 53  BILITOT 0.6 0.6  PROT 6.7 6.5  ALBUMIN 3.7 3.2*   Recent Labs    07/01/18 0535 07/04/18 0700 07/11/18 0700  WBC 12.4* 6.8 7.5  NEUTROABS 10.3* 4.8 4.4  HGB 10.9* 11.0* 12.9  HCT 34.7* 34.2* 39.3  MCV 92.3 91.7 91.2  PLT 150 190 298   Lab Results    Component Value Date   TSH 1.030 09/11/2017   Lab Results  Component Value Date   HGBA1C 5.2 05/30/2018   No results found for: CHOL, HDL, LDLCALC, LDLDIRECT, TRIG, CHOLHDL  Significant Diagnostic Results in last 30 days:  No results found.  Assessment/Plan  #1 history of dementia she appears to be doing well with supportive care her weight has stabilized continue Namenda and continue supportive care she also is on Celexa for coexistent depression which appears to be stable.  2.  History of recurrent vomiting in the past with a history of GERD she is doing well with the Zofran before meals and Prilosec twice a day.  3.  Anemia with a previous history of occult positive stools again not thought to be a good candidate for aggressive work-up and her hemoglobin has shown stability it was 12.9 on lab done late in September will update this she is on Prilosec twice daily.  4.-History of hypertension not on any medications nonetheless as stated above this appears to be stable.  5.  History of osteoporosis with previous history of humerus and femur fracture she is on Prolia and this is well-tolerated.--She is also on calcium with vitamin D   6.  Previous history of pneumonia again this was in the left lower lobe she did require hospitalization but has not really had any recurrence here she is doing well in this regard she did respond to prednisone and antibiotics during her hospitalization.  7.  History of osteoarthritis at times will complain of this but the Biofreeze topical appears to help.  8.  History of weight loss as noted above continues on Remeron for appetite stimulation his appears stabilized continue to monitor weights--  #9 history of allergic rhinitis continues on Claritin this appears stable.  10.  History of thrush this appears to be resolving will extend the course of nystatin for 3 additional days and monitor.  11.  History of pigmentation left  cheek again will discuss  with nursing follow-up on this with possible dermatology follow up this will need family input as well  Also will order a microalbumin since she is due for this-  [phone]

## 2018-08-29 ENCOUNTER — Encounter (HOSPITAL_COMMUNITY)
Admission: RE | Admit: 2018-08-29 | Discharge: 2018-08-29 | Disposition: A | Discharge status: Home / Self Care | Payer: Medicare Other | Source: Skilled Nursing Facility | Attending: Internal Medicine | Admitting: Internal Medicine

## 2018-08-29 DIAGNOSIS — A419 Sepsis, unspecified organism: Secondary | ICD-10-CM | POA: Diagnosis present

## 2018-08-29 DIAGNOSIS — F0390 Unspecified dementia without behavioral disturbance: Secondary | ICD-10-CM | POA: Diagnosis present

## 2018-08-29 DIAGNOSIS — Z5189 Encounter for other specified aftercare: Secondary | ICD-10-CM | POA: Diagnosis present

## 2018-08-29 LAB — BASIC METABOLIC PANEL
Anion gap: 7 (ref 5–15)
BUN: 10 mg/dL (ref 8–23)
CHLORIDE: 106 mmol/L (ref 98–111)
CO2: 27 mmol/L (ref 22–32)
CREATININE: 0.71 mg/dL (ref 0.44–1.00)
Calcium: 8 mg/dL — ABNORMAL LOW (ref 8.9–10.3)
GFR calc non Af Amer: >60 mL/min (ref >60)
Glucose, Bld: 79 mg/dL (ref 70–99)
Potassium: 3.7 mmol/L (ref 3.5–5.1)
Sodium: 140 mmol/L (ref 135–145)

## 2018-08-29 LAB — CBC WITH DIFFERENTIAL/PLATELET
Abs Immature Granulocytes: 0.02 10*3/uL (ref 0.00–0.07)
BASOS ABS: 0 10*3/uL (ref 0.0–0.1)
Basophils Relative: 1 %
Eosinophils Absolute: 0.1 10*3/uL (ref 0.0–0.5)
Eosinophils Relative: 2 %
HEMATOCRIT: 39.1 % (ref 36.0–46.0)
HEMOGLOBIN: 12.3 g/dL (ref 12.0–15.0)
Immature Granulocytes: 0 %
LYMPHS ABS: 1.9 10*3/uL (ref 0.7–4.0)
LYMPHS PCT: 31 %
MCH: 30.3 pg (ref 26.0–34.0)
MCHC: 31.5 g/dL (ref 30.0–36.0)
MCV: 96.3 fL (ref 80.0–100.0)
MONO ABS: 0.5 10*3/uL (ref 0.1–1.0)
Monocytes Relative: 9 %
Neutro Abs: 3.4 10*3/uL (ref 1.7–7.7)
Neutrophils Relative %: 57 %
Platelets: 210 10*3/uL (ref 150–400)
RBC: 4.06 MIL/uL (ref 3.87–5.11)
RDW: 15 % (ref 11.5–15.5)
WBC: 6 10*3/uL (ref 4.0–10.5)
nRBC: 0 % (ref 0.0–0.2)

## 2018-10-08 ENCOUNTER — Encounter: Payer: Self-pay | Admitting: Internal Medicine

## 2018-10-08 ENCOUNTER — Non-Acute Institutional Stay (SKILLED_NURSING_FACILITY): Payer: Medicare Other | Admitting: Internal Medicine

## 2018-10-08 DIAGNOSIS — F0390 Unspecified dementia without behavioral disturbance: Secondary | ICD-10-CM | POA: Diagnosis not present

## 2018-10-08 DIAGNOSIS — F331 Major depressive disorder, recurrent, moderate: Secondary | ICD-10-CM

## 2018-10-08 DIAGNOSIS — K224 Dyskinesia of esophagus: Secondary | ICD-10-CM

## 2018-10-08 DIAGNOSIS — I10 Essential (primary) hypertension: Secondary | ICD-10-CM

## 2018-10-08 NOTE — Progress Notes (Signed)
Location:    [facility] Room Number: 115/W Place of Service:  SNF [phone]) Provider:  Einar Crow MD  [name], MD  Patient Care Team: [name], MD as PCP - General (Internal Medicine) Synetta Shadow as Physician Assistant (Internal Medicine)  Extended Emergency Contact Information Primary Emergency Contact: [name] [place] Home Phone: [phone] Relation: Son Secondary Emergency Contact: [name] [place] Home Phone: [phone] Mobile Phone: [phone] Relation: Son  Code Status:  Full Code Goals of care: Advanced Directive information Advanced Directives 10/08/2018  Does Patient Have a Medical Advance Directive? Yes  Type of Advance Directive (No Data)  Does patient want to make changes to medical advance directive? No - Patient declined  Copy of Healthcare Power of Attorney in Chart? No - copy requested  Would patient like information on creating a medical advance directive? No - Patient declined  Pre-existing out of facility DNR order (yellow form or pink MOST form) -     Chief Complaint  Patient presents with  . Medical Management of Chronic Issues    Routine visit of medical management ,    HPI:  Pt is a 84 y.o. female seen today for medical management of chronic diseases.    Patient has H/O Hypertension, recurrent Vomiting, Falls, GERD and Dementia. And Osteoporosis Patient is Long term Resident of facility. She is Mostly stable. No New Nursing issues. Her weight is 120 lbs which is stable She is unable to give any history but denies any acute complains Does Have Chronic Cough  Past Medical History:  Diagnosis Date  . Asthma   . Dementia (HCC)   . Diabetes mellitus   . H. pylori infection MAR 2014  . Hypertension   . Stroke [facility])    Past Surgical History:  Procedure Laterality Date  . CHOLECYSTECTOMY    . COLECTOMY     secondary to diverticulitis  .  ESOPHAGOGASTRODUODENOSCOPY (EGD) WITH ESOPHAGEAL DILATION N/A 01/07/2013   QIO:[id number] ring was found at the gastroesophageal junction/SINGLE DUODENAL DIVERTICULUM    No Known Allergies  Outpatient Encounter Medications as of 10/08/2018  Medication Sig  . Balsam [place]-Castor Oil (VENELEX) OINT Apply to sacrum and bilateral buttocks q shift & prn every shift  . Calcium Carbonate-Vitamin D (CALCIUM 500/VITAMIN D PO) Take 500 mg by mouth 2 (two) times daily.  . citalopram (CELEXA) 20 MG tablet Take 20 mg by mouth daily.   Marland Kitchen denosumab (PROLIA) 60 MG/ML SOLN injection Inject 60 mg into the skin every 6 (six) months. Administer in upper arm, thigh, or abdomen  . Ferrous Sulfate (IRON) 325 (65 Fe) MG TABS Take 325 mg by mouth every morning.   Marland Kitchen ipratropium (ATROVENT) 0.02 % nebulizer solution Take 0.5 mg by nebulization every 4 (four) hours as needed for wheezing or shortness of breath.  . loratadine (CLARITIN) 10 MG tablet Take 10 mg by mouth daily.  . memantine (NAMENDA) 10 MG tablet Take 10 mg by mouth 2 (two) times daily.  . Menthol, Topical Analgesic, (BIOFREEZE) 4 % GEL Apply 1 application topically daily as needed (for knee pain management). Apply prn to knees for pain management as needed daily   . mirtazapine (REMERON) 15 MG tablet Take 15 mg by mouth at bedtime.  Marland Kitchen omeprazole (PRILOSEC) 40 MG capsule Take 40 mg by mouth 2 (two) times daily.   . ondansetron (ZOFRAN) 4 MG tablet Take 4 mg by mouth 3 (three) times daily before  meals.   . [DISCONTINUED] nystatin cream (MYCOSTATIN) Apply 1 application topically 3 (three) times daily.   No facility-administered encounter medications on file as of 10/08/2018.      Review of Systems  Unable to perform ROS: Dementia    Immunization History  Administered Date(s) Administered  . Influenza, High Dose Seasonal PF 06/23/2018  . Influenza-Unspecified 07/20/2016, 07/20/2017  . Pneumococcal Conjugate-13 06/12/2017  . Pneumococcal-Unspecified  07/24/2016  . Tdap 07/12/2017   Pertinent  Health Maintenance Due  Topic Date Due  . OPHTHALMOLOGY EXAM  11/08/2018 (Originally 04/05/2018)  . URINE MICROALBUMIN  11/08/2018 (Originally 06/03/1942)  . HEMOGLOBIN A1C  11/30/2018  . FOOT EXAM  01/24/2019  . INFLUENZA VACCINE  Completed  . DEXA SCAN  Completed  . PNA vac Low Risk Adult  Completed   Fall Risk  07/23/2018 07/12/2017  Falls in the past year? No No   Functional Status Survey:    Vitals:   10/08/18 1047  BP: 127/62  Pulse: 87  Resp: 16  Temp: 98.1 F (36.7 C)  TempSrc: Oral  SpO2: 94%  Weight: 119 lb 12.8 oz (54.3 kg)  Height: 5\' 5"  (1.651 m)   Body mass index is 19.94 kg/m. Physical Exam Constitutional:      Appearance: She is well-developed.  HENT:     Head: Normocephalic.  Eyes:     Pupils: Pupils are equal, round, and reactive to light.  Neck:     Musculoskeletal: Neck supple.  Cardiovascular:     Rate and Rhythm: Normal rate and regular rhythm.     Heart sounds: No murmur.  Pulmonary:     Effort: Pulmonary effort is normal. No respiratory distress.     Breath sounds: Normal breath sounds. No stridor. No wheezing.  Abdominal:     General: Bowel sounds are normal. There is no distension.     Palpations: Abdomen is soft.     Tenderness: There is no abdominal tenderness. There is no guarding.  Lymphadenopathy:     Cervical: No cervical adenopathy.  Skin:    General: Skin is warm and dry.  Neurological:     Mental Status: She is alert.     Comments: Follows simple commands. Not oriented. Answers Appropriately. Mostly Wheelchair bound  Psychiatric:        Behavior: Behavior normal.     Labs reviewed: Recent Labs    11/16/17 0700  07/04/18 0700 07/11/18 0700 08/29/18 0700  NA  --    < > 144 141 140  K  --    < > 3.4* 4.0 3.7  CL  --    < > 114* 108 106  CO2  --    < > 23 24 27   GLUCOSE  --    < > 122* 104* 79  BUN  --    < > 8 8 10   CREATININE  --    < > 0.64 0.65 0.71  CALCIUM  --     < > 8.4* 8.6* 8.0*  MG 1.8  --   --   --   --   PHOS 3.9  --   --   --   --    < > = values in this interval not displayed.   Recent Labs    10/10/17 0830 06/30/18 1855  AST 19 23  ALT 9* 11  ALKPHOS 61 53  BILITOT 0.6 0.6  PROT 6.7 6.5  ALBUMIN 3.7 3.2*   Recent Labs    07/04/18 0700 07/11/18  0700 08/29/18 0700  WBC 6.8 7.5 6.0  NEUTROABS 4.8 4.4 3.4  HGB 11.0* 12.9 12.3  HCT 34.2* 39.3 39.1  MCV 91.7 91.2 96.3  PLT 190 298 210   Lab Results  Component Value Date   TSH 1.030 09/11/2017   Lab Results  Component Value Date   HGBA1C 5.2 05/30/2018   No results found for: CHOL, HDL, LDLCALC, LDLDIRECT, TRIG, CHOLHDL  Significant Diagnostic Results in last 30 days:  No results found.  Assessment/Plan  Essential hypertension Patient was taken off her antihypertensives as her blood pressure was running low . blood pressure has been stable since then  Age-related osteoporosis Patient on Prolia and tolerating it well  Esophageal dysmotility She is stable on Prilosec and Zofran before meals  Depression Patient stays on Remeron and Celexa  Dementia Stable on Namenda Hyperglycemia Patient has a ? history of diabetes. Her blood sugars always have been less than 100 Last A1C was 5.2 Not on any Hypoglycemic  h/o Anemia with Occult Positive Discussion with her son patient is not a candidate for any aggressive work-up Discontinued Iron to avoid GI side effects on her . Her Hgb is Normal right now. ACP Patient is very frail with Dementia. She is also [place] witness She is not candidate for any aggressive treatment. To D/W the Son for her Code Status Family/ staff Communication:   Labs/tests ordered:

## 2018-11-18 ENCOUNTER — Encounter: Payer: Self-pay | Admitting: Internal Medicine

## 2018-11-18 ENCOUNTER — Non-Acute Institutional Stay (SKILLED_NURSING_FACILITY): Payer: Medicare Other | Admitting: Internal Medicine

## 2018-11-18 DIAGNOSIS — F0390 Unspecified dementia without behavioral disturbance: Secondary | ICD-10-CM

## 2018-11-18 DIAGNOSIS — R112 Nausea with vomiting, unspecified: Secondary | ICD-10-CM | POA: Diagnosis not present

## 2018-11-18 DIAGNOSIS — N898 Other specified noninflammatory disorders of vagina: Secondary | ICD-10-CM

## 2018-11-18 DIAGNOSIS — I10 Essential (primary) hypertension: Secondary | ICD-10-CM

## 2018-11-18 DIAGNOSIS — M810 Age-related osteoporosis without current pathological fracture: Secondary | ICD-10-CM

## 2018-11-18 NOTE — Progress Notes (Signed)
Location:    [facility] Room Number: 115/W Place of Service:  SNF (31) Provider:  Full Code  Mahlon GammonGupta, Anjali L, MD  Patient Care Team: Mahlon GammonGupta, Anjali L, MD as PCP - General (Internal Medicine) Synetta ShadowLassen, Arlo C, PA-C as Physician Assistant (Internal Medicine)  Extended Emergency Contact Information Primary Emergency Contact: [name],[name]  [place] Home Phone: [phone] Relation: Son Secondary Emergency Contact: [name],[name]  [place] Home Phone: [phone] Mobile Phone: [phone] Relation: Son  Code Status:  Full Code Goals of care: Advanced Directive information Advanced Directives 10/08/2018  Does Patient Have a Medical Advance Directive? Yes  Type of Advance Directive (No Data)  Does patient want to make changes to medical advance directive? No - Patient declined  Copy of Healthcare Power of Attorney in Chart? No - copy requested  Would patient like information on creating a medical advance directive? No - Patient declined  Pre-existing out of facility DNR order (yellow form or pink MOST form) -     Chief Complaint  Patient presents with  . Medical Management of Chronic Issues    Routine Vsit of Medical Management, Due Eye Exam and Albumin   Medical management of chronic medical conditions including hypertension recurrent vomiting GERD dementia osteoporosis questionable type 2 diabetes and anemia with occult positive stool in the past.  Also acute visit secondary to suspected cyst perineum HPI:  Pt is a 85 y.o. female seen today for medical management of chronic diseases.  As noted above as well as apparently a cyst type lesion in her perineum.  Patient is a long-term resident of facility and has not really had any recent acute issues.  She has been mostly stable her weight is down slightly at 115 pounds and she is on supplements update weight is pending.  Nursing does not really report any issues she has  somewhat of a spotty appetite but that has been her baseline.  Nursing does report a cyst type lesion that is somewhat tender in her perineum.  At one point she had recurrent vomiting but this appears to have largely resolved with routine Zofran as well as Prilosec.  She also at one point had an occult positive stool but hemoglobin has been stable and-she would most likely be a poor candidate for aggressive work-up and family at this point does not desire that- she had been on iron but that was discontinued because of GI side effects we will update her CBC last 1 showed stability at 12.3 November.  Regards diabetes type 2 this is questionable her hemoglobin A1c was 5.2- she is not on any medication.  She also has a history of hypertension but she has been stable on no meds   She is a poor historian secondary to dementia but did not have any complaints however we examine her perineum she did appear have some discomfort when we examined the cystlike area   Past Medical History:  Diagnosis Date  . Asthma   . Dementia (HCC)   . Diabetes mellitus   . H. pylori infection MAR 2014  . Hypertension   . Stroke [facility](HCC)    Past Surgical History:  Procedure Laterality Date  . CHOLECYSTECTOMY    . COLECTOMY     secondary to diverticulitis  . ESOPHAGOGASTRODUODENOSCOPY (EGD) WITH ESOPHAGEAL DILATION N/A 01/07/2013   [id number]:Schatzki ring was found at the gastroesophageal junction/SINGLE DUODENAL DIVERTICULUM    No Known Allergies  Outpatient Encounter Medications as of 11/18/2018  Medication Sig  .  Balsam [place]-Castor Oil (VENELEX) OINT Apply to sacrum and bilateral buttocks q shift & prn every shift  . Calcium Carbonate-Vitamin D (CALCIUM 500/VITAMIN D PO) Take 500 mg by mouth 2 (two) times daily.  . citalopram (CELEXA) 20 MG tablet Take 20 mg by mouth daily.   Marland Kitchen. denosumab (PROLIA) 60 MG/ML SOLN injection Inject 60 mg into the skin every 6 (six) months. Administer in upper arm, thigh, or abdomen  .  ipratropium (ATROVENT) 0.02 % nebulizer solution Take 0.5 mg by nebulization every 4 (four) hours as needed for wheezing or shortness of breath.  . loratadine (CLARITIN) 10 MG tablet Take 10 mg by mouth daily.  . memantine (NAMENDA) 10 MG tablet Take 10 mg by mouth 2 (two) times daily.  . Menthol, Topical Analgesic, (BIOFREEZE) 4 % GEL Apply 1 application topically daily as needed (for knee pain management). Apply prn to knees for pain management as needed daily   . mirtazapine (REMERON) 15 MG tablet Take 15 mg by mouth at bedtime.  Marland Kitchen. omeprazole (PRILOSEC) 40 MG capsule Take 40 mg by mouth 2 (two) times daily.   . ondansetron (ZOFRAN) 4 MG tablet Take 4 mg by mouth 3 (three) times daily before meals.   . [DISCONTINUED] Ferrous Sulfate (IRON) 325 (65 Fe) MG TABS Take 325 mg by mouth every morning.    No facility-administered encounter medications on file as of 11/18/2018.      Review of Systems   This is limited secondary to dementia please see HPI  Immunization History  Administered Date(s) Administered  . Influenza, High Dose Seasonal PF 06/22/2018  . Influenza-Unspecified 07/20/2016, 07/20/2017  . Pneumococcal Conjugate-13 06/12/2017  . Pneumococcal-Unspecified 07/24/2016  . Tdap 07/12/2017   Pertinent  Health Maintenance Due  Topic Date Due  . OPHTHALMOLOGY EXAM  12/17/2018 (Originally 04/05/2018)  . URINE MICROALBUMIN  12/17/2018 (Originally 06/03/1942)  . HEMOGLOBIN A1C  11/30/2018  . FOOT EXAM  01/24/2019  . INFLUENZA VACCINE  Completed  . DEXA SCAN  Completed  . PNA vac Low Risk Adult  Completed   Fall Risk  07/23/2018 07/12/2017  Falls in the past year? No No   Functional Status Survey:    Vitals:   11/18/18 1149  BP: 118/66  Pulse: 72  Resp: 16  Temp: 97.9 F (36.6 C)  TempSrc: Oral  SpO2: 94%  Weight: 115 lb 6.4 oz (52.3 kg)  Height: 5\' 5"  (1.651 m)   Body mass index is 19.2 kg/m. Physical Exam   In general this is a pleasant elderly female in no  distress.  Her skin is warm and dry I do note inferior vaginal area she does have a small cystlike area that appears to have opened up slightly with a minimal amount of drainage there is tenderness to palpation of the area. And some mild erythema.  Eyes visual acuity appears to be intact sclera and conjunctive are clear.  Oropharynx is clear mucous membranes moist.  Chest is clear to auscultation with somewhat shallow air entry there is no labored breathing.  Heart is regular rate and rhythm without murmur gallop or rub she does not have significant lower extremity edema.  Her abdomen is soft does not appear to be tender there are very active bowel sounds.  Musculoskeletal appears to move all extremities x4 at baseline with baseline lower extremity weakness somewhat limited exam since she was in bed.  Neurologic is grossly intact her speech is clear could not really appreciate true lateralizing findings.  Psych she is  oriented to self is pleasant does answer simple questions appropriately   Labs reviewed: Recent Labs    07/04/18 0700 07/11/18 0700 08/29/18 0700  NA 144 141 140  K 3.4* 4.0 3.7  CL 114* 108 106  CO2 23 24 27   GLUCOSE 122* 104* 79  BUN 8 8 10   CREATININE 0.64 0.65 0.71  CALCIUM 8.4* 8.6* 8.0*   Recent Labs    06/30/18 1855  AST 23  ALT 11  ALKPHOS 53  BILITOT 0.6  PROT 6.5  ALBUMIN 3.2*   Recent Labs    07/04/18 0700 07/11/18 0700 08/29/18 0700  WBC 6.8 7.5 6.0  NEUTROABS 4.8 4.4 3.4  HGB 11.0* 12.9 12.3  HCT 34.2* 39.3 39.1  MCV 91.7 91.2 96.3  PLT 190 298 210   Lab Results  Component Value Date   TSH 1.030 09/11/2017   Lab Results  Component Value Date   HGBA1C 5.2 05/30/2018   No results found for: CHOL, HDL, LDLCALC, LDLDIRECT, TRIG, CHOLHDL  Significant Diagnostic Results in last 30 days:  No results found.  Assessment/Plan  #1 history of cystlike area perineum-we will treat with doxycycline 100 mg twice daily for 7 days and  add a probiotic twice daily for 10 days- continue to monitor for any changes.  2.  History of dementia she appears to be doing well with supportive care she has had some mild weight loss she is now on supplements will await updated weights- she also continues on Namenda and is on Celexa for coexistent depression again this appears relatively stable.  3.  History of hypertension appears stable she is not on any medications.  4.  History of recurrent vomiting this was quite an issue at one point but has largely resolved with the routine Zofran 3 times daily as well as Prilosec.  5.  History of osteoporosis she is on Prolia as well as calcium with vitamin D she appears to be tolerating this well.  6.  History of type 2 diabetes this is questionable hemoglobin A1c was 5.2 in August will update this.  7.-  Anemia with occult positive stool in the past there is been no reoccurrence to my knowledge hemoglobin has shown stability she did not tolerate iron secondary to GI side effects- again she would be a poor aggressive work-up candidate and at this point family does not wish to pursue this- will update a CBC.  8.  History of allergic rhinitis this is been stable on Claritin.  Of note she is due for diabetic eye exam will write an order for that as well.  [id number]

## 2018-11-19 ENCOUNTER — Encounter (HOSPITAL_COMMUNITY)
Admission: RE | Admit: 2018-11-19 | Discharge: 2018-11-19 | Disposition: A | Discharge status: Home / Self Care | Payer: Medicare Other | Source: Skilled Nursing Facility | Attending: Internal Medicine | Admitting: Internal Medicine

## 2018-11-19 DIAGNOSIS — Z5189 Encounter for other specified aftercare: Secondary | ICD-10-CM | POA: Insufficient documentation

## 2018-11-19 DIAGNOSIS — D649 Anemia, unspecified: Secondary | ICD-10-CM | POA: Diagnosis present

## 2018-11-19 DIAGNOSIS — F0390 Unspecified dementia without behavioral disturbance: Secondary | ICD-10-CM | POA: Diagnosis present

## 2018-11-19 LAB — CBC WITH DIFFERENTIAL/PLATELET
Abs Immature Granulocytes: 0.02 10*3/uL (ref 0.00–0.07)
Basophils Absolute: 0 10*3/uL (ref 0.0–0.1)
Basophils Relative: 1 %
Eosinophils Absolute: 0.2 10*3/uL (ref 0.0–0.5)
Eosinophils Relative: 3 %
HCT: 38.4 % (ref 36.0–46.0)
HEMOGLOBIN: 12 g/dL (ref 12.0–15.0)
Immature Granulocytes: 0 %
Lymphocytes Relative: 33 %
Lymphs Abs: 1.7 10*3/uL (ref 0.7–4.0)
MCH: 31.4 pg (ref 26.0–34.0)
MCHC: 31.3 g/dL (ref 30.0–36.0)
MCV: 100.5 fL — ABNORMAL HIGH (ref 80.0–100.0)
Monocytes Absolute: 0.5 10*3/uL (ref 0.1–1.0)
Monocytes Relative: 10 %
NEUTROS ABS: 2.7 10*3/uL (ref 1.7–7.7)
Neutrophils Relative %: 53 %
PLATELETS: 255 10*3/uL (ref 150–400)
RBC: 3.82 MIL/uL — AB (ref 3.87–5.11)
RDW: 13.6 % (ref 11.5–15.5)
WBC: 5.2 10*3/uL (ref 4.0–10.5)
nRBC: 0 % (ref 0.0–0.2)

## 2018-11-19 LAB — BASIC METABOLIC PANEL
Anion gap: 7 (ref 5–15)
BUN: 13 mg/dL (ref 8–23)
CO2: 26 mmol/L (ref 22–32)
Calcium: 8.3 mg/dL — ABNORMAL LOW (ref 8.9–10.3)
Chloride: 109 mmol/L (ref 98–111)
Creatinine, Ser: 0.76 mg/dL (ref 0.44–1.00)
Glucose, Bld: 89 mg/dL (ref 70–99)
POTASSIUM: 3.6 mmol/L (ref 3.5–5.1)
SODIUM: 142 mmol/L (ref 135–145)

## 2018-11-19 LAB — HEMOGLOBIN A1C
Hgb A1c MFr Bld: 5.5 % (ref 4.8–5.6)
MEAN PLASMA GLUCOSE: 111.15 mg/dL

## 2018-12-11 ENCOUNTER — Non-Acute Institutional Stay (SKILLED_NURSING_FACILITY): Payer: Medicare Other | Admitting: Adult Health

## 2018-12-11 ENCOUNTER — Encounter: Payer: Self-pay | Admitting: Adult Health

## 2018-12-11 DIAGNOSIS — K219 Gastro-esophageal reflux disease without esophagitis: Secondary | ICD-10-CM | POA: Diagnosis not present

## 2018-12-11 DIAGNOSIS — F339 Major depressive disorder, recurrent, unspecified: Secondary | ICD-10-CM

## 2018-12-11 DIAGNOSIS — J3089 Other allergic rhinitis: Secondary | ICD-10-CM

## 2018-12-11 DIAGNOSIS — M810 Age-related osteoporosis without current pathological fracture: Secondary | ICD-10-CM

## 2018-12-11 DIAGNOSIS — R1312 Dysphagia, oropharyngeal phase: Secondary | ICD-10-CM

## 2018-12-11 DIAGNOSIS — F0390 Unspecified dementia without behavioral disturbance: Secondary | ICD-10-CM | POA: Diagnosis not present

## 2018-12-11 NOTE — Progress Notes (Signed)
Location:   [facility] Room Number: 115 W Place of Service:  SNF (31)   CODE STATUS: Full Code  No Known Allergies  Chief Complaint  Patient presents with  . Acute Visit    Care Plan Meeting    HPI:  We have come together for her routine care plan meeting. She does not have family present. She does continue to slowly lose weight. Her po intake is poor. There are no reports of uncontrolled pain; no reports of agitation or anxiety present.   Past Medical History:  Diagnosis Date  . Asthma   . Dementia (HCC)   . Diabetes mellitus   . H. pylori infection MAR 2014  . Hypertension   . Stroke [facility])     Past Surgical History:  Procedure Laterality Date  . CHOLECYSTECTOMY    . COLECTOMY     secondary to diverticulitis  . ESOPHAGOGASTRODUODENOSCOPY (EGD) WITH ESOPHAGEAL DILATION N/A 01/07/2013   [id number] ring was found at the gastroesophageal junction/SINGLE DUODENAL DIVERTICULUM    Social History   Socioeconomic History  . Marital status: Married    Spouse name: Not on file  . Number of children: Not on file  . Years of education: Not on file  . Highest education level: Not on file  Occupational History  . Not on file  Social Needs  . Financial resource strain: Not hard at all  . Food insecurity:    Worry: Never true    Inability: Never true  . Transportation needs:    Medical: No    Non-medical: No  Tobacco Use  . Smoking status: Never Smoker  . Smokeless tobacco: Never Used  Substance and Sexual Activity  . Alcohol use: No  . Drug use: No  . Sexual activity: Never  Lifestyle  . Physical activity:    Days per week: 0 days    Minutes per session: 0 min  . Stress: To some extent  Relationships  . Social connections:    Talks on phone: Never    Gets together: Never    Attends religious service: Never    Active member of club or organization: No    Attends meetings of clubs or organizations: Never    Relationship status:  Widowed  . Intimate partner violence:    Fear of current or ex partner: No    Emotionally abused: No    Physically abused: No    Forced sexual activity: No  Other Topics Concern  . Not on file  Social History Narrative  . Not on file   Family History  Problem Relation Age of Onset  . Colon cancer Neg Hx       VITAL SIGNS BP 110/66   Pulse 73   Temp (!) 97.5 F (36.4 C)   Resp 18   Ht 5\' 5"  (1.651 m)   Wt 114 lb 9.6 oz (52 kg)   SpO2 92%   BMI 19.07 kg/m   Outpatient Encounter Medications as of 12/11/2018  Medication Sig  . Balsam [place]-Castor Oil (VENELEX) OINT Apply to sacrum and bilateral buttocks q shift & prn every shift  . Calcium Carbonate-Vitamin D (CALCIUM 500/VITAMIN D PO) Take 500 mg by mouth 2 (two) times daily.  . citalopram (CELEXA) 20 MG tablet Take 20 mg by mouth daily.   Marland Kitchen denosumab (PROLIA) 60 MG/ML SOLN injection Inject 60 mg into the skin every 6 (six) months. Administer in upper arm, thigh, or abdomen  . ipratropium (ATROVENT) 0.02 %  nebulizer solution Take 0.5 mg by nebulization every 4 (four) hours as needed for wheezing or shortness of breath.  . loratadine (CLARITIN) 10 MG tablet Take 10 mg by mouth daily.  . memantine (NAMENDA) 10 MG tablet Take 10 mg by mouth 2 (two) times daily.  . Menthol, Topical Analgesic, (BIOFREEZE) 4 % GEL Apply 1 application topically daily as needed (for knee pain management). Apply prn to knees for pain management as needed daily   . mirtazapine (REMERON) 15 MG tablet Take 15 mg by mouth at bedtime.  . NON FORMULARY Diet Type:  Continue patient with Dysphagia 2 with honey thickened liquids.  Add ground meat to lunch and dinner trays - no sausage, no gravy on meat - gravy in bowls.  Continue HTL  . omeprazole (PRILOSEC) 40 MG capsule Take 40 mg by mouth 2 (two) times daily.   . ondansetron (ZOFRAN) 4 MG tablet Take 4 mg by mouth 3 (three) times daily before meals.    No facility-administered encounter medications on file  as of 12/11/2018.      SIGNIFICANT DIAGNOSTIC EXAMS  LABS REVIEWED TODAY:   11-19-18; wbc 5.2; hgb 12.0; hct 38.4; mcv 100.5 plt 255; glucose 89; bun 13; creat 0.76; k+ 3.6; na++ 142; ca 8.3 hgb a1c 5.5   Review of Systems  Unable to perform ROS: Dementia (unable to participate )    Physical Exam Constitutional:      General: She is not in acute distress.    Appearance: She is well-developed. She is not diaphoretic.     Comments: thin  Neck:     Musculoskeletal: Neck supple.     Thyroid: No thyromegaly.  Cardiovascular:     Rate and Rhythm: Normal rate and regular rhythm.     Pulses: Normal pulses.     Heart sounds: Normal heart sounds.  Pulmonary:     Effort: Pulmonary effort is normal. No respiratory distress.     Breath sounds: Normal breath sounds.  Abdominal:     General: Bowel sounds are normal. There is no distension.     Palpations: Abdomen is soft.     Tenderness: There is no abdominal tenderness.  Musculoskeletal:     Right lower leg: No edema.     Left lower leg: No edema.     Comments: Is able to move all extremities   Lymphadenopathy:     Cervical: No cervical adenopathy.  Skin:    General: Skin is warm and dry.  Neurological:     Mental Status: She is alert. Mental status is at baseline.  Psychiatric:        Mood and Affect: Mood normal.        ASSESSMENT/ PLAN:  TODAY  1.  Dysphagia oropharyngeal phase: no signs of aspiration present; will continue honey thick liquids  2. Unspecified type dementia without behavioral disturbance: is without change: weight is 114 pounds; will continue namenda 10 mg twice daily   3. Chronic non-seasonal allergic rhinitis: is stable will continue claritin 10 mg daily atrovent neb every 4 hours as needed  4. GERD without esophagitis: is stable will continue prilosec 40 mg daily zofrn 4 mg prior to meals   5. Age related osteoporosis without current pathological fracture: is stable will continue prolia 60 mg every  6 months  6. Major depression recurrent chronic: is emotionally stable will continue celexa 20 mg daily and remeron 15 mg nightly   Will continue current plan of care and will monitor  MD is aware of resident's narcotic use and is in agreement with current plan of care. We will attempt to wean resident as apropriate     NP [facility]  Contact [phone] Monday through Friday 8am- 5pm  After hours call [phone]  

## 2018-12-17 ENCOUNTER — Encounter: Payer: Self-pay | Admitting: Adult Health

## 2018-12-17 ENCOUNTER — Non-Acute Institutional Stay (SKILLED_NURSING_FACILITY): Payer: Medicare Other | Admitting: Adult Health

## 2018-12-17 DIAGNOSIS — F339 Major depressive disorder, recurrent, unspecified: Secondary | ICD-10-CM

## 2018-12-17 DIAGNOSIS — R1312 Dysphagia, oropharyngeal phase: Secondary | ICD-10-CM | POA: Diagnosis not present

## 2018-12-17 DIAGNOSIS — J3089 Other allergic rhinitis: Secondary | ICD-10-CM

## 2018-12-17 DIAGNOSIS — M810 Age-related osteoporosis without current pathological fracture: Secondary | ICD-10-CM

## 2018-12-17 DIAGNOSIS — I10 Essential (primary) hypertension: Secondary | ICD-10-CM | POA: Diagnosis not present

## 2018-12-17 DIAGNOSIS — F0390 Unspecified dementia without behavioral disturbance: Secondary | ICD-10-CM

## 2018-12-17 DIAGNOSIS — K219 Gastro-esophageal reflux disease without esophagitis: Secondary | ICD-10-CM | POA: Diagnosis not present

## 2018-12-17 NOTE — Progress Notes (Signed)
Location:   [facility] Room Number: 115 W Place of Service:  SNF (31)   CODE STATUS: DNR  No Known Allergies  Chief Complaint  Patient presents with  . Medical Management of Chronic Issues    Essential hpyertension; dysphagia oropharyngeal phase; chronic non-seasonal allergic rhinitis.     HPI:  She is a 85 year old long term resident of this facility being seen for the management of her chronic illnesses: hypertension; dysphagia; allergic rhinitis. There are no reports of aspiration; no indications of uncontrolled pain; no agitation no anxiety.   Past Medical History:  Diagnosis Date  . Asthma   . Dementia (HCC)   . Diabetes mellitus   . H. pylori infection MAR 2014  . Hypertension   . Stroke [facility])     Past Surgical History:  Procedure Laterality Date  . CHOLECYSTECTOMY    . COLECTOMY     secondary to diverticulitis  . ESOPHAGOGASTRODUODENOSCOPY (EGD) WITH ESOPHAGEAL DILATION N/A 01/07/2013   [id number] ring was found at the gastroesophageal junction/SINGLE DUODENAL DIVERTICULUM    Social History   Socioeconomic History  . Marital status: Married    Spouse name: Not on file  . Number of children: Not on file  . Years of education: Not on file  . Highest education level: Not on file  Occupational History  . Not on file  Social Needs  . Financial resource strain: Not hard at all  . Food insecurity:    Worry: Never true    Inability: Never true  . Transportation needs:    Medical: No    Non-medical: No  Tobacco Use  . Smoking status: Never Smoker  . Smokeless tobacco: Never Used  Substance and Sexual Activity  . Alcohol use: No  . Drug use: No  . Sexual activity: Never  Lifestyle  . Physical activity:    Days per week: 0 days    Minutes per session: 0 min  . Stress: To some extent  Relationships  . Social connections:    Talks on phone: Never    Gets together: Never    Attends religious service: Never    Active  member of club or organization: No    Attends meetings of clubs or organizations: Never    Relationship status: Widowed  . Intimate partner violence:    Fear of current or ex partner: No    Emotionally abused: No    Physically abused: No    Forced sexual activity: No  Other Topics Concern  . Not on file  Social History Narrative  . Not on file   Family History  Problem Relation Age of Onset  . Colon cancer Neg Hx       VITAL SIGNS BP 117/63   Pulse 74   Temp (!) 97.3 F (36.3 C)   Resp 18   Ht  (1.651 m)   Wt 117 lb 9.6 oz (53.3 kg)   SpO2 92%   BMI 19.57 kg/m   Outpatient Encounter Medications as of 12/17/2018  Medication Sig  . Balsam [place]-Castor Oil (VENELEX) OINT Apply to sacrum and bilateral buttocks q shift & prn every shift  . Calcium Carbonate-Vitamin D (CALCIUM 500/VITAMIN D PO) Take 500 mg by mouth 2 (two) times daily.  . citalopram (CELEXA) 20 MG tablet Take 20 mg by mouth daily.   Marland Kitchen denosumab (PROLIA) 60 MG/ML SOLN injection Inject 60 mg into the skin every 6 (six) months. Administer in upper arm, thigh,  or abdomen  . ipratropium (ATROVENT) 0.02 % nebulizer solution Take 0.5 mg by nebulization every 4 (four) hours as needed for wheezing or shortness of breath.  . ketoconazole (NIZORAL) 2 % cream Apply 1 application topically to yeast / fungal rash in groin area / buttocks  . loratadine (CLARITIN) 10 MG tablet Take 10 mg by mouth daily.  . memantine (NAMENDA) 10 MG tablet Take 10 mg by mouth 2 (two) times daily.  . Menthol, Topical Analgesic, (BIOFREEZE) 4 % GEL Apply 1 application topically daily as needed (for knee pain management). Apply prn to knees for pain management as needed daily   . mirtazapine (REMERON) 15 MG tablet Take 15 mg by mouth at bedtime.  . NON FORMULARY Diet Type:  Continue patient with Dysphagia 2 with honey thickened liquids.  Add ground meat to lunch and dinner trays - no sausage, no gravy on meat - gravy in bowls.  Continue HTL  .  NON FORMULARY Magic Cup three times daily between meals for unplanned weight loss  . omeprazole (PRILOSEC) 40 MG capsule Take 40 mg by mouth 2 (two) times daily.   . ondansetron (ZOFRAN) 4 MG tablet Take 4 mg by mouth 3 (three) times daily before meals.    No facility-administered encounter medications on file as of 12/17/2018.      SIGNIFICANT DIAGNOSTIC EXAMS  LABS REVIEWED TODAY:   11-19-18; wbc 5.2; hgb 12.0; hct 38.4; mcv 100.5 plt 255; glucose 89; bun 13; creat 0.76; k+ 3.6; na++ 142; ca 8.3 hgb a1c 5.5  NO NEW LABS.   Review of Systems  Unable to perform ROS: Dementia (unable to participate )    Physical Exam Constitutional:      General: She is not in acute distress.    Appearance: She is well-developed. She is not diaphoretic.     Comments: thin  Neck:     Musculoskeletal: Neck supple.     Thyroid: No thyromegaly.  Cardiovascular:     Rate and Rhythm: Normal rate and regular rhythm.     Pulses: Normal pulses.     Heart sounds: Normal heart sounds.  Pulmonary:     Effort: Pulmonary effort is normal. No respiratory distress.     Breath sounds: Normal breath sounds.  Abdominal:     General: Bowel sounds are normal. There is no distension.     Palpations: Abdomen is soft.     Tenderness: There is no abdominal tenderness.  Musculoskeletal:     Right lower leg: No edema.     Left lower leg: No edema.     Comments: Is able to move all extremities   Lymphadenopathy:     Cervical: No cervical adenopathy.  Skin:    General: Skin is warm and dry.  Neurological:     Mental Status: She is alert. Mental status is at baseline.  Psychiatric:        Mood and Affect: Mood normal.      ASSESSMENT/ PLAN:  TODAY;   1. Essential hypertension: is stable b/p 117/63: will continue to monitor her status.   2. Dysphagia oropharyngeal phase: no signs of aspiration present; will continue honey thick liquids  3. Chronic non-seasonal allergic rhinitis: is stable will continue  claritin 10 mg daily and atrovent neb every 4 hours as needed  4. GERD without esophagitis: is stable will continue prilosec 40 mg twice daily and zofran 4 mg prior to meals for nausea   5. Major depression recurrent; chronic: is emotionally stable will  continue celexa 20 mg daily and remeron 15 mg nightly   6. Age related osteoporosis without current pathological fracture: is stable will continue prolia 60 mg every 6 months  7. Unspecified  type dementia without behavioral disturbance: is without change weight is 117 pounds will continue namenda 10 mg twice daily        MD is aware of resident's narcotic use and is in agreement with current plan of care. We will attempt to wean resident as apropriate   Synthia Innocent NP [facility]  Contact [phone] Monday through Friday 8am- 5pm  After hours call [phone]

## 2018-12-19 DIAGNOSIS — J3089 Other allergic rhinitis: Secondary | ICD-10-CM | POA: Insufficient documentation

## 2018-12-19 DIAGNOSIS — K219 Gastro-esophageal reflux disease without esophagitis: Secondary | ICD-10-CM | POA: Insufficient documentation

## 2018-12-19 DIAGNOSIS — F339 Major depressive disorder, recurrent, unspecified: Secondary | ICD-10-CM | POA: Insufficient documentation

## 2018-12-19 DIAGNOSIS — R1312 Dysphagia, oropharyngeal phase: Secondary | ICD-10-CM | POA: Insufficient documentation

## 2019-01-07 ENCOUNTER — Encounter: Payer: Self-pay | Admitting: Internal Medicine

## 2019-01-07 ENCOUNTER — Non-Acute Institutional Stay (SKILLED_NURSING_FACILITY): Payer: Medicare Other | Admitting: Internal Medicine

## 2019-01-07 DIAGNOSIS — F0390 Unspecified dementia without behavioral disturbance: Secondary | ICD-10-CM

## 2019-01-07 DIAGNOSIS — S40021A Contusion of right upper arm, initial encounter: Secondary | ICD-10-CM | POA: Diagnosis not present

## 2019-01-07 NOTE — Progress Notes (Signed)
Location:  [facility] Room Number: 115 W Place of Service:  SNF [phone]) Provider:  Einar Crow, MD  [name], MD  Patient Care Team: [name], MD as PCP - General (Internal Medicine) Synetta Shadow as Physician Assistant (Internal Medicine)  Extended Emergency Contact Information Primary Emergency Contact: [name] [place] Home Phone: [phone] Relation: Son Secondary Emergency Contact: [name] [place] Home Phone: [phone] Mobile Phone: [phone] Relation: Son  Code Status:  DNR Goals of care: Advanced Directive information Advanced Directives 01/07/2019  Does Patient Have a Medical Advance Directive? Yes  Type of Advance Directive Out of facility DNR (pink MOST or yellow form)  Does patient want to make changes to medical advance directive? No - Patient declined  Copy of Healthcare Power of Attorney in Chart? -  Would patient like information on creating a medical advance directive? No - Patient declined  Pre-existing out of facility DNR order (yellow form or pink MOST form) Yellow form placed in chart (order not valid for inpatient use)     Chief Complaint  Patient presents with  . Acute Visit    Bruising    HPI:  Pt is a 85 y.o. female seen today for an acute visit for Bruise in her Right Arm  Patient has H/O Hypertension, recurrent Vomiting, Falls, GERD and Dementia. And Osteoporosis Patient is long term resident of  facility Nurse wanted me to see her today as they had noticed bruise in her Right hand this morning. It is big bruise on her Right Upper hand. No H/o Injury No impression of hands. Patient is unable to give any history. 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 No Other Issues were noticed    Past Medical History:  Diagnosis Date  . Asthma   . Dementia (HCC)   . Diabetes mellitus   . H. pylori  infection MAR 2014  . Hypertension   . Stroke [facility])    Past Surgical History:  Procedure Laterality Date  . CHOLECYSTECTOMY    . COLECTOMY     secondary to diverticulitis  . ESOPHAGOGASTRODUODENOSCOPY (EGD) WITH ESOPHAGEAL DILATION N/A 01/07/2013   [id number] ring was found at the gastroesophageal junction/SINGLE DUODENAL DIVERTICULUM    No Known Allergies  Outpatient Encounter Medications as of 01/07/2019  Medication Sig  . Balsam [place]-Castor Oil (VENELEX) OINT Apply to sacrum and bilateral buttocks q shift & prn every shift  . Calcium Carbonate-Vitamin D (CALCIUM 500/VITAMIN D PO) Take 500 mg by mouth 2 (two) times daily.  . citalopram (CELEXA) 20 MG tablet Take 20 mg by mouth daily.   Marland Kitchen denosumab (PROLIA) 60 MG/ML SOLN injection Inject 60 mg into the skin every 6 (six) months. Administer in upper arm, thigh, or abdomen  . ipratropium (ATROVENT) 0.02 % nebulizer solution Take 0.5 mg by nebulization every 4 (four) hours as needed for wheezing or shortness of breath.  . loratadine (CLARITIN) 10 MG tablet Take 10 mg by mouth daily.  . memantine (NAMENDA) 10 MG tablet Take 10 mg by mouth 2 (two) times daily.  . Menthol, Topical Analgesic, (BIOFREEZE) 4 % GEL Apply 1 application topically daily as needed (for knee pain management). Apply prn to knees for pain management as needed daily   . mirtazapine (REMERON) 15 MG tablet Take 15 mg by mouth at bedtime.  . NON FORMULARY Diet Type:  Continue patient with Dysphagia 2 with  honey thickened liquids.  Add ground meat to lunch and dinner trays - no sausage, no gravy on meat - gravy in bowls.  Continue HTL  . NON FORMULARY Magic Cup three times daily between meals for unplanned weight loss  . omeprazole (PRILOSEC) 40 MG capsule Take 40 mg by mouth 2 (two) times daily.   . ondansetron (ZOFRAN) 4 MG tablet Take 4 mg by mouth 3 (three) times daily before meals.    No facility-administered encounter medications on file as of 01/07/2019.      Review of Systems  Unable to perform ROS: Dementia    Immunization History  Administered Date(s) Administered  . Influenza, High Dose Seasonal PF 07/04/2018  . Influenza-Unspecified 07/20/2016, 07/20/2017, 07/18/2018  . Pneumococcal Conjugate-13 06/12/2017  . Pneumococcal-Unspecified 07/24/2016  . Tdap 07/12/2017   Pertinent  Health Maintenance Due  Topic Date Due  . OPHTHALMOLOGY EXAM  02/07/2019 (Originally 04/05/2018)  . URINE MICROALBUMIN  02/07/2019 (Originally 06/03/1942)  . FOOT EXAM  01/24/2019  . HEMOGLOBIN A1C  05/20/2019  . INFLUENZA VACCINE  Completed  . DEXA SCAN  Completed  . PNA vac Low Risk Adult  Completed   Fall Risk  07/23/2018 07/12/2017  Falls in the past year? No No   Functional Status Survey:    Vitals:   01/07/19 1137  Weight: 117 lb 9.6 oz (53.3 kg)  Height:  (1.651 m)   Body mass index is 19.57 kg/m. Physical Exam Vitals signs reviewed.  Constitutional:      Appearance: Normal appearance.  HENT:     Head: Normocephalic.     Nose: Nose normal.     Mouth/Throat:     Mouth: Mucous membranes are moist.     Pharynx: Oropharynx is clear.  Eyes:     Pupils: Pupils are equal, round, and reactive to light.  Neck:     Musculoskeletal: Neck supple.  Cardiovascular:     Rate and Rhythm: Normal rate and regular rhythm.     Pulses: Normal pulses.     Heart sounds: Normal heart sounds.  Pulmonary:     Effort: Pulmonary effort is normal.     Breath sounds: Normal breath sounds.  Abdominal:     General: Abdomen is flat. Bowel sounds are normal.     Palpations: Abdomen is soft.  Musculoskeletal:        General: No swelling.  Skin:    Comments: Has a big Bruise in her Right Arm. It was not warm or no swelling was noticed It was tender  Neurological:     General: No focal deficit present.     Mental Status: She is alert.  Psychiatric:        Mood and Affect: Mood normal.        Thought Content: Thought content normal.     Labs reviewed:  Recent Labs    07/11/18 0700 08/29/18 0700 11/19/18 0700  NA 141 140 142  K 4.0 3.7 3.6  CL 108 106 109  CO2 GLUCOSE 104* 79 89  BUN CREATININE 0.65 0.71 0.76  CALCIUM 8.6* 8.0* 8.3*   Recent Labs    06/30/18 1855  AST 23  ALT 11  ALKPHOS 53  BILITOT 0.6  PROT 6.5  ALBUMIN 3.2*   Recent Labs    07/11/18 0700 08/29/18 0700 11/19/18 0700  WBC 7.5 6.0 5.2  NEUTROABS 4.4 3.4 2.7  HGB 12.9 12.3 12.0  HCT 39.3 39.1 38.4  MCV  91.2 96.3 100.5*  PLT 298 210 255   Lab Results  Component Value Date   TSH 1.030 09/11/2017   Lab Results  Component Value Date   HGBA1C 5.5 11/19/2018   No results found for: CHOL, HDL, LDLCALC, LDLDIRECT, TRIG, CHOLHDL  Significant Diagnostic Results in last 30 days:  No results found.  Assessment/Plan  Right Arm Bruise Patient is not on any aspirin. No Injuries noticed by the staff ? Possible from BP cuff Will Continue to monitor D/W the nurse to monitor it for any infection Her Last CBC was normal  Dementia Patient seem to be stable. Continue Full Care      Family/ staff Communication:   Labs/tests ordered:   Total time spent in this patient care encounter was 25_ minutes; greater than 50% of the visit spent with staff, reviewing records , Labs and coordinating care for problems addressed at this encounter.

## 2019-01-14 ENCOUNTER — Encounter: Payer: Self-pay | Admitting: Internal Medicine

## 2019-01-14 ENCOUNTER — Non-Acute Institutional Stay (SKILLED_NURSING_FACILITY): Payer: Medicare Other | Admitting: Internal Medicine

## 2019-01-14 DIAGNOSIS — F0390 Unspecified dementia without behavioral disturbance: Secondary | ICD-10-CM | POA: Diagnosis not present

## 2019-01-14 DIAGNOSIS — I10 Essential (primary) hypertension: Secondary | ICD-10-CM

## 2019-01-14 DIAGNOSIS — F339 Major depressive disorder, recurrent, unspecified: Secondary | ICD-10-CM | POA: Diagnosis not present

## 2019-01-14 DIAGNOSIS — R1312 Dysphagia, oropharyngeal phase: Secondary | ICD-10-CM | POA: Diagnosis not present

## 2019-01-14 NOTE — Progress Notes (Signed)
Location:    [facility] Room Number: 115/W Place of Service:  SNF [phone]) Provider:  Einar Crow MD  [name], MD  Patient Care Team: [name], MD as PCP - General (Internal Medicine) Synetta Shadow as Physician Assistant (Internal Medicine)  Extended Emergency Contact Information Primary Emergency Contact: [name] [place] Home Phone: [phone] Relation: Son Secondary Emergency Contact: [name] [place] Home Phone: [phone] Mobile Phone: [phone] Relation: Son  Code Status:  DNR Goals of care: Advanced Directive information Advanced Directives 01/14/2019  Does Patient Have a Medical Advance Directive? Yes  Type of Advance Directive Out of facility DNR (pink MOST or yellow form)  Does patient want to make changes to medical advance directive? No - Patient declined  Copy of Healthcare Power of Attorney in Chart? No - copy requested  Would patient like information on creating a medical advance directive? No - Patient declined  Pre-existing out of facility DNR order (yellow form or pink MOST form) -     Chief Complaint  Patient presents with  . Medical Management of Chronic Issues    Rotine visit of medical management   . Best Practice Recommendations    Eye Exam Not to be done  due to facility restrictions, Microalbuminuria not to be done as she is incontinenet    HPI:  Pt is a 85 y.o. female seen today for medical management of chronic diseases.   Patient has H/O Hypertension, recurrent Vomiting, Falls, GERD and Dementia. And Osteoporosis. She is Long term resident of facility Has been stable. No New Nursing Issues. Her weight is stable 117 lbs. Appetite is good She continues to be full care and Fall Risk. Patient did not have any new complains  Past Medical History:  Diagnosis Date  . Asthma   . Dementia (HCC)   . Diabetes mellitus   . H. pylori infection MAR 2014  .  Hypertension   . Stroke [facility])    Past Surgical History:  Procedure Laterality Date  . CHOLECYSTECTOMY    . COLECTOMY     secondary to diverticulitis  . ESOPHAGOGASTRODUODENOSCOPY (EGD) WITH ESOPHAGEAL DILATION N/A 01/07/2013   GQB:[id number] ring was found at the gastroesophageal junction/SINGLE DUODENAL DIVERTICULUM    No Known Allergies  Outpatient Encounter Medications as of 01/14/2019  Medication Sig  . Balsam [place]-Castor Oil (VENELEX) OINT Apply to sacrum and bilateral buttocks q shift & prn every shift  . Calcium Carbonate-Vitamin D (CALCIUM 500/VITAMIN D PO) Take 500 mg by mouth 2 (two) times daily.  . citalopram (CELEXA) 20 MG tablet Take 20 mg by mouth daily.   Marland Kitchen denosumab (PROLIA) 60 MG/ML SOLN injection Inject 60 mg into the skin every 6 (six) months. Administer in upper arm, thigh, or abdomen  . ipratropium (ATROVENT) 0.02 % nebulizer solution Take 0.5 mg by nebulization every 4 (four) hours as needed for wheezing or shortness of breath.  . loratadine (CLARITIN) 10 MG tablet Take 10 mg by mouth daily.  . memantine (NAMENDA) 10 MG tablet Take 10 mg by mouth 2 (two) times daily.  . Menthol, Topical Analgesic, (BIOFREEZE) 4 % GEL Apply 1 application topically daily as needed (for knee pain management). Apply prn to knees for pain management as needed daily   . mirtazapine (REMERON) 15 MG tablet Take 15 mg by mouth at bedtime.  . NON FORMULARY Diet Type:  Continue patient with Dysphagia 2 with honey thickened liquids.  Add ground  meat to lunch and dinner trays - no sausage, no gravy on meat - gravy in bowls.  Continue HTL  . NON FORMULARY Magic Cup three times daily between meals for unplanned weight loss  . omeprazole (PRILOSEC) 40 MG capsule Take 40 mg by mouth 2 (two) times daily.   . ondansetron (ZOFRAN) 4 MG tablet Take 4 mg by mouth 3 (three) times daily before meals.    No facility-administered encounter medications on file as of 01/14/2019.      Review of Systems  Unable  to perform ROS: Dementia    Immunization History  Administered Date(s) Administered  . Influenza, High Dose Seasonal PF 07/01/2018  . Influenza-Unspecified 07/20/2016, 07/20/2017, 07/18/2018  . Pneumococcal Conjugate-13 06/12/2017  . Pneumococcal-Unspecified 07/24/2016  . Tdap 07/12/2017   Pertinent  Health Maintenance Due  Topic Date Due  . OPHTHALMOLOGY EXAM  02/07/2019 (Originally 04/05/2018)  . URINE MICROALBUMIN  02/07/2019 (Originally 06/03/1942)  . FOOT EXAM  01/24/2019  . HEMOGLOBIN A1C  05/20/2019  . INFLUENZA VACCINE  Completed  . DEXA SCAN  Completed  . PNA vac Low Risk Adult  Completed   Fall Risk  07/23/2018 07/12/2017  Falls in the past year? No No   Functional Status Survey:    Vitals:   01/14/19 0829  BP: 125/63  Pulse: 81  Resp: 20  Temp: (!) 97.3 F (36.3 C)  TempSrc: Oral  SpO2: 92%  Weight: 117 lb 9.6 oz (53.3 kg)  Height: 5\' 5"  (1.651 m)   Body mass index is 19.57 kg/m. Physical Exam Vitals signs reviewed.  Constitutional:      Appearance: Normal appearance.  HENT:     Head: Normocephalic.     Nose: Nose normal.     Mouth/Throat:     Mouth: Mucous membranes are moist.     Pharynx: Oropharynx is clear.  Eyes:     Pupils: Pupils are equal, round, and reactive to light.  Neck:     Musculoskeletal: Neck supple.  Cardiovascular:     Rate and Rhythm: Normal rate and regular rhythm.     Pulses: Normal pulses.     Heart sounds: Normal heart sounds.  Pulmonary:     Effort: Pulmonary effort is normal.     Breath sounds: Normal breath sounds.  Abdominal:     General: Abdomen is flat. Bowel sounds are normal.     Palpations: Abdomen is soft.  Musculoskeletal:        General: No swelling.  Neurological:     General: No focal deficit present.     Mental Status: She is alert.     Comments: She is wheelchair dependent Follows all commands and answers Appropriately  Psychiatric:        Mood and Affect: Mood normal.        Thought Content:  Thought content normal.     Labs reviewed: Recent Labs    07/11/18 0700 08/29/18 0700 11/19/18 0700  NA 141 140 142  K 4.0 3.7 3.6  CL 108 106 109  CO2 24 27 26   GLUCOSE 104* 79 89  BUN 8 10 13   CREATININE 0.65 0.71 0.76  CALCIUM 8.6* 8.0* 8.3*   Recent Labs    06/30/18 1855  AST 23  ALT 11  ALKPHOS 53  BILITOT 0.6  PROT 6.5  ALBUMIN 3.2*   Recent Labs    07/11/18 0700 08/29/18 0700 11/19/18 0700  WBC 7.5 6.0 5.2  NEUTROABS 4.4 3.4 2.7  HGB 12.9 12.3 12.0  HCT 39.3 39.1 38.4  MCV 91.2 96.3 100.5*  PLT 298 210 255   Lab Results  Component Value Date   TSH 1.030 09/11/2017   Lab Results  Component Value Date   HGBA1C 5.5 11/19/2018   No results found for: CHOL, HDL, LDLCALC, LDLDIRECT, TRIG, CHOLHDL  Significant Diagnostic Results in last 30 days:  No results found.  Assessment/Plan Essential hypertension Is off all her antihypertensive and BP stabe; Age-related osteoporosis On Prolia And stable  Esophageal dysmotility Continue PPI and Zofran Weight stable  Depression Patient stays on Remeron and Celexa  Dementia Stable on Namenda Hyperglycemia Patient has a ? history of diabetes. Her blood sugars always have been less than 100 Last A1C was 5.5 Not on any Hypoglycemic h/o Anemia with Occult Positive Discussion with her son patient is not a candidate for any aggressive work-up Discontinued Iron to avoid GI side effects on her . Her Hgb is Normal right now. She does have Macrocytosis mild Will Get Vit B12 level with next Blood draw ACP Patient is very frail with Dementia. She is also [place] witness She is not candidate for any aggressive treatment. She is DNR right now    Family/ staff Communication:   Labs/tests ordered:    Total time spent in this patient care encounter was _ minutes; greater than 50% of the visit spent counseling patient,and staff reviewing records , Labs and coordinating care for problems addressed  at this encounter.

## 2019-01-27 ENCOUNTER — Non-Acute Institutional Stay (SKILLED_NURSING_FACILITY): Payer: Medicare Other | Admitting: Internal Medicine

## 2019-01-27 ENCOUNTER — Encounter: Payer: Self-pay | Admitting: Internal Medicine

## 2019-01-27 DIAGNOSIS — F0390 Unspecified dementia without behavioral disturbance: Secondary | ICD-10-CM

## 2019-01-27 DIAGNOSIS — L0291 Cutaneous abscess, unspecified: Secondary | ICD-10-CM

## 2019-01-27 NOTE — Progress Notes (Signed)
Location:    [facility] Room Number: 115/W Place of Service:  SNF [phone]) Provider: Einar CrowAnjali Nixon Kolton MD  Mahlon GammonGupta, Jameca Chumley L, MD  Patient Care Team: Mahlon GammonGupta, Mechelle Pates L, MD as PCP - General (Internal Medicine) Synetta ShadowLassen, Arlo C, PA-C as Physician Assistant (Internal Medicine)  Extended Emergency Contact Information Primary Emergency Contact: [name],[name]  [place] Home Phone: [phone] Relation: Son Secondary Emergency Contact: [name],[name]  [place] Home Phone: [phone] Mobile Phone: [phone] Relation: Son  Code Status:  DNR Goals of care: Advanced Directive information Advanced Directives 01/27/2019  Does Patient Have a Medical Advance Directive? Yes  Type of Advance Directive Out of facility DNR (pink MOST or yellow form)  Does patient want to make changes to medical advance directive? No - Patient declined  Copy of Healthcare Power of Attorney in Chart? No - copy requested  Would patient like information on creating a medical advance directive? No - Patient declined  Pre-existing out of facility DNR order (yellow form or pink MOST form) -     Chief Complaint  Patient presents with  . Acute Visit    Boil infected    HPI:  Pt is a 85 y.o. female seen today for an acute visit for Infected boil/ Abscess on her Right Abdomen Patient has H/O Hypertension, recurrent Vomiting, Falls, GERD and Dementia. And Osteoporosis. She is Long term resident of facility Nurses noticed a painful swelling on her abdomen which seems like infected boil. Patient unable to give much history due to her dementia  Per nurses she has no Fever or chills. Appetite is good  Past Medical History:  Diagnosis Date  . Asthma   . Dementia (HCC)   . Diabetes mellitus   . H. pylori infection MAR 2014  . Hypertension   . Stroke [facility](HCC)    Past Surgical History:  Procedure Laterality Date  . CHOLECYSTECTOMY    . COLECTOMY     secondary to  diverticulitis  . ESOPHAGOGASTRODUODENOSCOPY (EGD) WITH ESOPHAGEAL DILATION N/A 01/07/2013   [id number]:Schatzki ring was found at the gastroesophageal junction/SINGLE DUODENAL DIVERTICULUM    No Known Allergies  Outpatient Encounter Medications as of 01/27/2019  Medication Sig  . Balsam [place]-Castor Oil (VENELEX) OINT Apply to sacrum and bilateral buttocks q shift & prn every shift  . Calcium Carbonate-Vitamin D (CALCIUM 500/VITAMIN D PO) Take 500 mg by mouth 2 (two) times daily.  . citalopram (CELEXA) 20 MG tablet Take 20 mg by mouth daily.   Marland Kitchen. denosumab (PROLIA) 60 MG/ML SOLN injection Inject 60 mg into the skin every 6 (six) months. Administer in upper arm, thigh, or abdomen  . ipratropium (ATROVENT) 0.02 % nebulizer solution Take 0.5 mg by nebulization every 4 (four) hours as needed for wheezing or shortness of breath.  . loratadine (CLARITIN) 10 MG tablet Take 10 mg by mouth daily.  . memantine (NAMENDA) 10 MG tablet Take 10 mg by mouth 2 (two) times daily.  . Menthol, Topical Analgesic, (BIOFREEZE) 4 % GEL Apply 1 application topically daily as needed (for knee pain management). Apply prn to knees for pain management as needed daily   . mirtazapine (REMERON) 15 MG tablet Take 15 mg by mouth at bedtime.  . NON FORMULARY Diet Type:  Continue patient with Dysphagia 2 with honey thickened liquids.  Add ground meat to lunch and dinner trays - no sausage, no gravy on meat - gravy in bowls.  Continue HTL  . NON FORMULARY Magic Cup three times daily  between meals for unplanned weight loss  . omeprazole (PRILOSEC) 40 MG capsule Take 40 mg by mouth 2 (two) times daily.   . ondansetron (ZOFRAN) 4 MG tablet Take 4 mg by mouth 3 (three) times daily before meals.    No facility-administered encounter medications on file as of 01/27/2019.      Review of Systems  Unable to perform ROS: Dementia    Immunization History  Administered Date(s) Administered  . Influenza, High Dose Seasonal PF 07/09/2018  .  Influenza-Unspecified 07/20/2016, 07/20/2017, 07/18/2018  . Pneumococcal Conjugate-13 06/12/2017  . Pneumococcal-Unspecified 07/24/2016  . Tdap 07/12/2017   Pertinent  Health Maintenance Due  Topic Date Due  . OPHTHALMOLOGY EXAM  02/07/2019 (Originally 04/05/2018)  . URINE MICROALBUMIN  02/07/2019 (Originally 06/03/1942)  . FOOT EXAM  02/26/2019 (Originally 01/24/2019)  . INFLUENZA VACCINE  05/17/2019  . HEMOGLOBIN A1C  05/20/2019  . DEXA SCAN  Completed  . PNA vac Low Risk Adult  Completed   Fall Risk  07/23/2018 07/12/2017  Falls in the past year? No No   Functional Status Survey:    Vitals:   01/27/19 1442  BP: 131/60  Pulse: 71  Resp: 17  Temp: (!) 97.3 F (36.3 C)  SpO2: 94%   There is no height or weight on file to calculate BMI. Physical Exam Vitals signs reviewed.  Constitutional:      Appearance: Normal appearance.  HENT:     Head: Normocephalic.     Nose: Nose normal.     Mouth/Throat:     Mouth: Mucous membranes are moist.     Pharynx: Oropharynx is clear.  Eyes:     Pupils: Pupils are equal, round, and reactive to light.  Neck:     Musculoskeletal: Neck supple.  Cardiovascular:     Rate and Rhythm: Normal rate and regular rhythm.     Pulses: Normal pulses.     Heart sounds: Normal heart sounds.  Pulmonary:     Effort: Pulmonary effort is normal.     Breath sounds: Normal breath sounds.  Abdominal:     General: Abdomen is flat. Bowel sounds are normal.     Palpations: Abdomen is soft.  Musculoskeletal:        General: No swelling.  Skin:    Comments: Has superficial tender swelling on right lower abdomen around 2 cm long  Neurological:     General: No focal deficit present.     Mental Status: She is alert.     Comments: She is wheelchair dependent Follows all commands and answers Appropriately  Psychiatric:        Mood and Affect: Mood normal.        Thought Content: Thought content normal.     Labs reviewed: Recent Labs    07/11/18 0700  08/29/18 0700 11/19/18 0700  NA 141 140 142  K 4.0 3.7 3.6  CL 108 106 109  CO2 24 27 26   GLUCOSE 104* 79 89  BUN 8 10 13   CREATININE 0.65 0.71 0.76  CALCIUM 8.6* 8.0* 8.3*   Recent Labs    06/30/18 1855  AST 23  ALT 11  ALKPHOS 53  BILITOT 0.6  PROT 6.5  ALBUMIN 3.2*   Recent Labs    07/11/18 0700 08/29/18 0700 11/19/18 0700  WBC 7.5 6.0 5.2  NEUTROABS 4.4 3.4 2.7  HGB 12.9 12.3 12.0  HCT 39.3 39.1 38.4  MCV 91.2 96.3 100.5*  PLT 298 210 255   Lab Results  Component Value  Date   TSH 1.030 09/11/2017   Lab Results  Component Value Date   HGBA1C 5.5 11/19/2018   No results found for: CHOL, HDL, LDLCALC, LDLDIRECT, TRIG, CHOLHDL  Significant Diagnostic Results in last 30 days:  No results found.  Assessment/Plan ? Right Abdomen superficial Abscess Will start on Doxycyline 100 mg for 7 days And then reval   Family/ staff Communication:   Labs/tests ordered:

## 2019-01-30 ENCOUNTER — Encounter: Payer: Self-pay | Admitting: Adult Health

## 2019-01-30 ENCOUNTER — Non-Acute Institutional Stay (SKILLED_NURSING_FACILITY): Payer: Medicare Other | Admitting: Adult Health

## 2019-01-30 DIAGNOSIS — F339 Major depressive disorder, recurrent, unspecified: Secondary | ICD-10-CM

## 2019-01-30 DIAGNOSIS — L02231 Carbuncle of abdominal wall: Secondary | ICD-10-CM | POA: Diagnosis not present

## 2019-01-30 DIAGNOSIS — K219 Gastro-esophageal reflux disease without esophagitis: Secondary | ICD-10-CM

## 2019-01-30 DIAGNOSIS — M810 Age-related osteoporosis without current pathological fracture: Secondary | ICD-10-CM | POA: Diagnosis not present

## 2019-01-30 NOTE — Progress Notes (Signed)
Location:   [facility] Room Number: 115 W Place of Service:  SNF (31)   CODE STATUS: DNR  No Known Allergies  Chief Complaint  Patient presents with  . Medical Management of Chronic Issues    gerd without esophagitis; age related osteoporosis without current pathological fracture; majore depression recurrent chronic.     HPI:  She is a 85 year old long term resident of this facility being seen for the management of her chronic illnesses: gerd; osteoporosis; depression. She is presently being treated for a carbuncle on her abdomen. There are no reports of fevers present. No reports of changes in appetite; no reports of agitation or anxiety.   Past Medical History:  Diagnosis Date  . Asthma   . Dementia (HCC)   . Diabetes mellitus   . H. pylori infection MAR 2014  . Hypertension   . Stroke [facility])     Past Surgical History:  Procedure Laterality Date  . CHOLECYSTECTOMY    . COLECTOMY     secondary to diverticulitis  . ESOPHAGOGASTRODUODENOSCOPY (EGD) WITH ESOPHAGEAL DILATION N/A 01/07/2013   [id number] ring was found at the gastroesophageal junction/SINGLE DUODENAL DIVERTICULUM    Social History   Socioeconomic History  . Marital status: Married    Spouse name: Not on file  . Number of children: Not on file  . Years of education: Not on file  . Highest education level: Not on file  Occupational History  . Not on file  Social Needs  . Financial resource strain: Not hard at all  . Food insecurity:    Worry: Never true    Inability: Never true  . Transportation needs:    Medical: No    Non-medical: No  Tobacco Use  . Smoking status: Never Smoker  . Smokeless tobacco: Never Used  Substance and Sexual Activity  . Alcohol use: No  . Drug use: No  . Sexual activity: Never  Lifestyle  . Physical activity:    Days per week: 0 days    Minutes per session: 0 min  . Stress: To some extent  Relationships  . Social connections:   Talks on phone: Never    Gets together: Never    Attends religious service: Never    Active member of club or organization: No    Attends meetings of clubs or organizations: Never    Relationship status: Widowed  . Intimate partner violence:    Fear of current or ex partner: No    Emotionally abused: No    Physically abused: No    Forced sexual activity: No  Other Topics Concern  . Not on file  Social History Narrative  . Not on file   Family History  Problem Relation Age of Onset  . Colon cancer Neg Hx       VITAL SIGNS BP 125/67   Pulse 73   Temp 98.6 F (37 C)   Resp 18   Ht 5\' 5"  (1.651 m)   Wt 117 lb 6.4 oz (53.3 kg)   BMI 19.54 kg/m   Outpatient Encounter Medications as of 01/30/2019  Medication Sig  . Balsam [place]-Castor Oil (VENELEX) OINT Apply to sacrum and bilateral buttocks q shift & prn every shift  . Calcium Carbonate-Vitamin D (CALCIUM 500/VITAMIN D PO) Take 500 mg by mouth 2 (two) times daily.  . citalopram (CELEXA) 20 MG tablet Take 20 mg by mouth daily.   Marland Kitchen denosumab (PROLIA) 60 MG/ML SOLN injection Inject 60 mg  into the skin every 6 (six) months. Administer in upper arm, thigh, or abdomen  . doxycycline (VIBRAMYCIN) 100 MG capsule Take 100 mg by mouth 2 (two) times daily. X 7 days for abdominal abscess, holding calcium while on ABT  . ipratropium (ATROVENT) 0.02 % nebulizer solution Take 0.5 mg by nebulization every 4 (four) hours as needed for wheezing or shortness of breath.  . loratadine (CLARITIN) 10 MG tablet Take 10 mg by mouth daily.  . memantine (NAMENDA) 10 MG tablet Take 10 mg by mouth 2 (two) times daily.  . Menthol, Topical Analgesic, (BIOFREEZE) 4 % GEL Apply 1 application topically daily as needed (for knee pain management). Apply prn to knees for pain management as needed daily   . mirtazapine (REMERON) 15 MG tablet Take 15 mg by mouth at bedtime.  . NON FORMULARY Diet Type:  Continue patient with Dysphagia 2 with honey thickened liquids.   Add ground meat to lunch and dinner trays - no sausage, no gravy on meat - gravy in bowls.  Continue HTL  . NON FORMULARY Magic Cup three times daily between meals for unplanned weight loss  . omeprazole (PRILOSEC) 40 MG capsule Take 40 mg by mouth 2 (two) times daily.   . ondansetron (ZOFRAN) 4 MG tablet Take 4 mg by mouth 3 (three) times daily before meals.   . Probiotic Product (RISA-BID PROBIOTIC) TABS Take 1 tablet by mouth 2 (two) times daily. X 10 days due to ABT   No facility-administered encounter medications on file as of 01/30/2019.      SIGNIFICANT DIAGNOSTIC EXAMS  LABS REVIEWED TODAY:   11-19-18; wbc 5.2; hgb 12.0; hct 38.4; mcv 100.5 plt 255; glucose 89; bun 13; creat 0.76; k+ 3.6; na++ 142; ca 8.3 hgb a1c 5.5  NO NEW LABS.    Review of Systems  Unable to perform ROS: Dementia (unable to participate )    Physical Exam Constitutional:      General: She is not in acute distress.    Appearance: She is well-developed. She is not diaphoretic.     Comments: thin  Neck:     Musculoskeletal: Neck supple.     Thyroid: No thyromegaly.  Cardiovascular:     Rate and Rhythm: Normal rate and regular rhythm.     Pulses: Normal pulses.     Heart sounds: Normal heart sounds.  Pulmonary:     Effort: Pulmonary effort is normal. No respiratory distress.     Breath sounds: Normal breath sounds.  Abdominal:     General: Bowel sounds are normal. There is no distension.     Palpations: Abdomen is soft.     Tenderness: There is no abdominal tenderness.  Musculoskeletal:     Right lower leg: No edema.     Left lower leg: No edema.     Comments: Is able to move all extremities   Lymphadenopathy:     Cervical: No cervical adenopathy.  Skin:    General: Skin is warm and dry.     Comments: Has boil on abdominal wall right upper quad is red and inflamed and painful to touch   Neurological:     Mental Status: She is alert. Mental status is at baseline.  Psychiatric:        Mood and  Affect: Mood normal.      ASSESSMENT/ PLAN:  TODAY;   1. gerd without esophagitis: is stable will continue prilosec 40 mg twice daily and zofran 4 mg prior to meals for nausea  2. Major depression recurrent chronic: is emotionally stable will continue celex 20 mg daily and remeron 15 mg nightly   3. Age related osteoporosis without current pathological fracture: is stable will continue prolia 60 mg every 6 months  4. Abdominal wall abscess: is without change will complete doxycycline and will monitor   PREVIOUS  5. Unspecified  type dementia without behavioral disturbance: is without change weight is 117 pounds will continue namenda 10 mg twice daily   6. Essential hypertension: is stable b/p 125/67: will continue to monitor her status.   7. Dysphagia oropharyngeal phase: no signs of aspiration present; will continue honey thick liquids  38. Chronic non-seasonal allergic rhinitis: is stable will continue claritin 10 mg daily and atrovent neb every 4 hours as needed   MD is aware of resident's narcotic use and is in agreement with current plan of care. We will attempt to wean resident as apropriate   Synthia Innocenteborah Liani Caris NP [facility]  Contact [phone] Monday through Friday 8am- 5pm  After hours call [phone]

## 2019-02-03 ENCOUNTER — Encounter: Payer: Self-pay | Admitting: Adult Health

## 2019-02-03 DIAGNOSIS — L02231 Carbuncle of abdominal wall: Secondary | ICD-10-CM | POA: Insufficient documentation

## 2019-02-03 NOTE — Progress Notes (Signed)
Entered in error

## 2019-02-04 ENCOUNTER — Non-Acute Institutional Stay (SKILLED_NURSING_FACILITY): Payer: Medicare Other | Admitting: Adult Health

## 2019-02-04 ENCOUNTER — Encounter (HOSPITAL_COMMUNITY)
Admission: RE | Admit: 2019-02-04 | Discharge: 2019-02-04 | Disposition: A | Discharge status: Home / Self Care | Payer: Medicare Other | Source: Skilled Nursing Facility | Attending: Adult Health | Admitting: Adult Health

## 2019-02-04 ENCOUNTER — Encounter: Payer: Self-pay | Admitting: Adult Health

## 2019-02-04 DIAGNOSIS — Z5189 Encounter for other specified aftercare: Secondary | ICD-10-CM | POA: Insufficient documentation

## 2019-02-04 DIAGNOSIS — F0390 Unspecified dementia without behavioral disturbance: Secondary | ICD-10-CM | POA: Insufficient documentation

## 2019-02-04 DIAGNOSIS — B379 Candidiasis, unspecified: Secondary | ICD-10-CM | POA: Insufficient documentation

## 2019-02-04 DIAGNOSIS — K219 Gastro-esophageal reflux disease without esophagitis: Secondary | ICD-10-CM | POA: Insufficient documentation

## 2019-02-04 DIAGNOSIS — L02231 Carbuncle of abdominal wall: Secondary | ICD-10-CM | POA: Diagnosis not present

## 2019-02-04 DIAGNOSIS — E119 Type 2 diabetes mellitus without complications: Secondary | ICD-10-CM | POA: Insufficient documentation

## 2019-02-04 DIAGNOSIS — D649 Anemia, unspecified: Secondary | ICD-10-CM | POA: Insufficient documentation

## 2019-02-04 NOTE — Progress Notes (Signed)
Location:   [facility] Room Number: [address] Place of Service:  SNF (31)   CODE STATUS: DNR  No Known Allergies  Chief Complaint  Patient presents with  . Acute Visit    Right upper quadrant abscess    HPI:  She has a red hot painful abscess on her right upper quadrant. There are no reports of fevers present. An I/D with sterile technique was used 5% lidocaine gel was applied to numb area. Incision performed and expressed large amount of purulent foul drainage removed.   Past Medical History:  Diagnosis Date  . Asthma   . Dementia (HCC)   . Diabetes mellitus   . H. pylori infection MAR 2014  . Hypertension   . Stroke [facility])     Past Surgical History:  Procedure Laterality Date  . CHOLECYSTECTOMY    . COLECTOMY     secondary to diverticulitis  . ESOPHAGOGASTRODUODENOSCOPY (EGD) WITH ESOPHAGEAL DILATION N/A 01/07/2013   [id number] ring was found at the gastroesophageal junction/SINGLE DUODENAL DIVERTICULUM    Social History   Socioeconomic History  . Marital status: Married    Spouse name: Not on file  . Number of children: Not on file  . Years of education: Not on file  . Highest education level: Not on file  Occupational History  . Not on file  Social Needs  . Financial resource strain: Not hard at all  . Food insecurity:    Worry: Never true    Inability: Never true  . Transportation needs:    Medical: No    Non-medical: No  Tobacco Use  . Smoking status: Never Smoker  . Smokeless tobacco: Never Used  Substance and Sexual Activity  . Alcohol use: No  . Drug use: No  . Sexual activity: Never  Lifestyle  . Physical activity:    Days per week: 0 days    Minutes per session: 0 min  . Stress: To some extent  Relationships  . Social connections:    Talks on phone: Never    Gets together: Never    Attends religious service: Never    Active member of club or organization: No    Attends meetings of clubs or organizations:  Never    Relationship status: Widowed  . Intimate partner violence:    Fear of current or ex partner: No    Emotionally abused: No    Physically abused: No    Forced sexual activity: No  Other Topics Concern  . Not on file  Social History Narrative  . Not on file   Family History  Problem Relation Age of Onset  . Colon cancer Neg Hx       VITAL SIGNS BP (!) 100/58   Pulse 71   Temp (!) 96.8 F (36 C)   Resp 18   Ht 5\' 5"  (1.651 m)   Wt 117 lb 6.4 oz (53.3 kg)   BMI 19.54 kg/m   Outpatient Encounter Medications as of 02/04/2019  Medication Sig  . Balsam [place]-Castor Oil (VENELEX) OINT Apply to sacrum and bilateral buttocks q shift & prn every shift  . Calcium Carbonate-Vitamin D (CALCIUM 500/VITAMIN D PO) Take 500 mg by mouth 2 (two) times daily.  . citalopram (CELEXA) 20 MG tablet Take 20 mg by mouth daily.   Marland Kitchen denosumab (PROLIA) 60 MG/ML SOLN injection Inject 60 mg into the skin every 6 (six) months. Administer in upper arm, thigh, or abdomen  . ipratropium (ATROVENT) 0.02 %  nebulizer solution Take 0.5 mg by nebulization every 4 (four) hours as needed for wheezing or shortness of breath.  . loratadine (CLARITIN) 10 MG tablet Take 10 mg by mouth daily.  . memantine (NAMENDA) 10 MG tablet Take 10 mg by mouth 2 (two) times daily.  . Menthol, Topical Analgesic, (BIOFREEZE) 4 % GEL Apply 1 application topically daily as needed (for knee pain management). Apply prn to knees for pain management as needed daily   . mirtazapine (REMERON) 15 MG tablet Take 15 mg by mouth at bedtime.  . NON FORMULARY Diet Type:  Continue patient with Dysphagia 2 with honey thickened liquids.  Add ground meat to lunch and dinner trays - no sausage, no gravy on meat - gravy in bowls.  Continue HTL  . NON FORMULARY Magic Cup three times daily between meals for unplanned weight loss  . omeprazole (PRILOSEC) 40 MG capsule Take 40 mg by mouth 2 (two) times daily.   . ondansetron (ZOFRAN) 4 MG tablet Take 4  mg by mouth 3 (three) times daily before meals.   . Probiotic Product (RISA-BID PROBIOTIC) TABS Take 1 tablet by mouth 2 (two) times daily. X 10 days due to ABT   No facility-administered encounter medications on file as of 02/04/2019.      SIGNIFICANT DIAGNOSTIC EXAMS  LABS REVIEWED TODAY:   11-19-18; wbc 5.2; hgb 12.0; hct 38.4; mcv 100.5 plt 255; glucose 89; bun 13; creat 0.76; k+ 3.6; na++ 142; ca 8.3 hgb a1c 5.5  NO NEW LABS.    Review of Systems  Unable to perform ROS: Dementia (unable to participate )   Physical Exam Constitutional:      General: She is not in acute distress.    Appearance: She is well-developed. She is not diaphoretic.     Comments: thin  Neck:     Musculoskeletal: Neck supple.     Thyroid: No thyromegaly.  Cardiovascular:     Rate and Rhythm: Normal rate and regular rhythm.     Pulses: Normal pulses.     Heart sounds: Normal heart sounds.  Pulmonary:     Effort: Pulmonary effort is normal. No respiratory distress.     Breath sounds: Normal breath sounds.  Abdominal:     General: Bowel sounds are normal. There is no distension.     Palpations: Abdomen is soft.     Tenderness: There is no abdominal tenderness.  Musculoskeletal: Normal range of motion.     Right lower leg: No edema.     Left lower leg: No edema.  Lymphadenopathy:     Cervical: No cervical adenopathy.  Skin:    General: Skin is warm and dry.     Comments: Large carbuncle on right upper quad I/D performed and packed   Neurological:     Mental Status: She is alert. Mental status is at baseline.  Psychiatric:        Mood and Affect: Mood normal.      ASSESSMENT/ PLAN:  TODAY;   1. Carbuncle of abdominal wall: I/D performed tolerated easily. Will complete doxycycline and will pack daily until healed.     MD is aware of resident's narcotic use and is in agreement with current plan of care. We will attempt to wean resident as apropriate   Synthia Innocenteborah Terrie Haring NP [facility]  Contact [phone] Monday through Friday 8am- 5pm  After hours call [phone]

## 2019-02-05 ENCOUNTER — Encounter (HOSPITAL_COMMUNITY)
Admission: RE | Admit: 2019-02-05 | Discharge: 2019-02-05 | Disposition: A | Discharge status: Home / Self Care | Payer: Medicare Other | Source: Skilled Nursing Facility | Attending: Adult Health | Admitting: Adult Health

## 2019-02-05 DIAGNOSIS — K219 Gastro-esophageal reflux disease without esophagitis: Secondary | ICD-10-CM | POA: Diagnosis present

## 2019-02-05 DIAGNOSIS — B379 Candidiasis, unspecified: Secondary | ICD-10-CM | POA: Diagnosis present

## 2019-02-05 DIAGNOSIS — F0390 Unspecified dementia without behavioral disturbance: Secondary | ICD-10-CM | POA: Diagnosis present

## 2019-02-05 DIAGNOSIS — Z5189 Encounter for other specified aftercare: Secondary | ICD-10-CM | POA: Diagnosis present

## 2019-02-05 DIAGNOSIS — D649 Anemia, unspecified: Secondary | ICD-10-CM | POA: Diagnosis present

## 2019-02-05 DIAGNOSIS — E119 Type 2 diabetes mellitus without complications: Secondary | ICD-10-CM | POA: Diagnosis present

## 2019-02-06 LAB — MICROALBUMIN, URINE: Microalb, Ur: 126.8 ug/mL — ABNORMAL HIGH

## 2019-02-25 ENCOUNTER — Non-Acute Institutional Stay (SKILLED_NURSING_FACILITY): Payer: Medicare Other | Admitting: Adult Health

## 2019-02-25 ENCOUNTER — Encounter: Payer: Self-pay | Admitting: Adult Health

## 2019-02-25 DIAGNOSIS — R1312 Dysphagia, oropharyngeal phase: Secondary | ICD-10-CM | POA: Diagnosis not present

## 2019-02-25 DIAGNOSIS — F0390 Unspecified dementia without behavioral disturbance: Secondary | ICD-10-CM

## 2019-02-25 DIAGNOSIS — I10 Essential (primary) hypertension: Secondary | ICD-10-CM | POA: Diagnosis not present

## 2019-02-25 NOTE — Progress Notes (Signed)
Location:   [name] Room Number: [address] Place of Service:  SNF (31)   CODE STATUS: DNR  No Known Allergies  Chief Complaint  Patient presents with  . Medical Management of Chronic Issues    Essential hypertension; dysphagia oropharyngeal phase; dementia without behavioral disturbance unspecified dementia type.      HPI:  She is a 85 year old long term resident of this facility being seen for the management of her chronic illnesses: hypertension; dysphagia; dementia. There are no reports of changes in appetite; her weight is stable from last month. There are no reports of agitation or anxiety; there are no reports of uncontrolled pain. There are no reports of fevers present. Her abdominal wall abscess has been treated.   Past Medical History:  Diagnosis Date  . Asthma   . Dementia (HCC)   . Diabetes mellitus   . H. pylori infection MAR 2014  . Hypertension   . Stroke [facility])     Past Surgical History:  Procedure Laterality Date  . CHOLECYSTECTOMY    . COLECTOMY     secondary to diverticulitis  . ESOPHAGOGASTRODUODENOSCOPY (EGD) WITH ESOPHAGEAL DILATION N/A 01/07/2013   [id number] ring was found at the gastroesophageal junction/SINGLE DUODENAL DIVERTICULUM    Social History   Socioeconomic History  . Marital status: Married    Spouse name: Not on file  . Number of children: Not on file  . Years of education: Not on file  . Highest education level: Not on file  Occupational History  . Not on file  Social Needs  . Financial resource strain: Not hard at all  . Food insecurity:    Worry: Never true    Inability: Never true  . Transportation needs:    Medical: No    Non-medical: No  Tobacco Use  . Smoking status: Never Smoker  . Smokeless tobacco: Never Used  Substance and Sexual Activity  . Alcohol use: No  . Drug use: No  . Sexual activity: Never  Lifestyle  . Physical activity:    Days per week: 0 days    Minutes per  session: 0 min  . Stress: To some extent  Relationships  . Social connections:    Talks on phone: Never    Gets together: Never    Attends religious service: Never    Active member of club or organization: No    Attends meetings of clubs or organizations: Never    Relationship status: Widowed  . Intimate partner violence:    Fear of current or ex partner: No    Emotionally abused: No    Physically abused: No    Forced sexual activity: No  Other Topics Concern  . Not on file  Social History Narrative  . Not on file   Family History  Problem Relation Age of Onset  . Colon cancer Neg Hx       VITAL SIGNS BP 114/70   Pulse 74   Temp 98 F (36.7 C)   Resp 16   Ht  (1.651 m)   Wt 118 lb (53.5 kg)   SpO2 96%   BMI 19.64 kg/m   Outpatient Encounter Medications as of 02/25/2019  Medication Sig  . Balsam [place]-Castor Oil (VENELEX) OINT Apply to sacrum and bilateral buttocks q shift & prn every shift  . Calcium Carbonate-Vitamin D (CALCIUM 500/VITAMIN D PO) Take 500 mg by mouth 2 (two) times daily.  . citalopram (CELEXA) 20 MG tablet Take  20 mg by mouth daily.   Marland Kitchen denosumab (PROLIA) 60 MG/ML SOLN injection Inject 60 mg into the skin every 6 (six) months. Administer in upper arm, thigh, or abdomen  . ipratropium (ATROVENT) 0.02 % nebulizer solution Take 0.5 mg by nebulization every 4 (four) hours as needed for wheezing or shortness of breath.  . loratadine (CLARITIN) 10 MG tablet Take 10 mg by mouth daily.  . memantine (NAMENDA) 10 MG tablet Take 10 mg by mouth 2 (two) times daily.  . Menthol, Topical Analgesic, (BIOFREEZE) 4 % GEL Apply 1 application topically daily as needed (for knee pain management). Apply prn to knees for pain management as needed daily   . mirtazapine (REMERON) 15 MG tablet Take 15 mg by mouth at bedtime.  . NON FORMULARY Diet Type:  Continue patient with Dysphagia 2 with honey thickened liquids.  Add ground meat to lunch and dinner trays - no sausage,  no gravy on meat - gravy in bowls.  Continue HTL  . NON FORMULARY Magic Cup three times daily between meals for unplanned weight loss  . nystatin cream (MYCOSTATIN) Apply topically to irritation of buttocks every shift as needed until cleared  . omeprazole (PRILOSEC) 40 MG capsule Take 40 mg by mouth 2 (two) times daily.   . ondansetron (ZOFRAN) 4 MG tablet Take 4 mg by mouth 3 (three) times daily before meals.    No facility-administered encounter medications on file as of 02/25/2019.      SIGNIFICANT DIAGNOSTIC EXAMS  LABS REVIEWED TODAY:   11-19-18; wbc 5.2; hgb 12.0; hct 38.4; mcv 100.5 plt 255; glucose 89; bun 13; creat 0.76; k+ 3.6; na++ 142; ca 8.3 hgb a1c 5.5  LABS REVIEWED TODAY:   02-05-19: urine micro-albumin  126.8   Review of Systems  Unable to perform ROS: Dementia (unable to participate )    Physical Exam Constitutional:      General: She is not in acute distress.    Appearance: She is well-developed. She is not diaphoretic.     Comments: thin  Neck:     Thyroid: No thyromegaly.  Cardiovascular:     Rate and Rhythm: Normal rate and regular rhythm.     Heart sounds: Normal heart sounds.  Pulmonary:     Effort: Pulmonary effort is normal. No respiratory distress.     Breath sounds: Normal breath sounds.  Abdominal:     General: Bowel sounds are normal. There is no distension.     Palpations: Abdomen is soft.     Tenderness: There is no abdominal tenderness.  Musculoskeletal: Normal range of motion.     Right lower leg: No edema.     Left lower leg: No edema.  Lymphadenopathy:     Cervical: No cervical adenopathy.  Skin:    General: Skin is warm and dry.  Neurological:     Mental Status: She is alert. Mental status is at baseline.  Psychiatric:        Mood and Affect: Mood normal.       ASSESSMENT/ PLAN:   TODAY;   1.  Unspecified type dementia without behavioral disturbance: is without change: weight is 118 (previous 117 ) pounds; will continue  namenda 10 mg twice daily   2. Essential hypertension: is stable b/p 114/70 will continue to monitor her status.   3. Dysphagia oropharyngeal phase: no signs of aspiration present will continue honey thick liquids   PREVIOUS  4. Chronic non-seasonal allergic rhinitis: is stable will continue claritin 10 mg daily  and atrovent neb every 4 hours as needed  5. gerd without esophagitis: is stable will continue prilosec 40 mg twice daily and zofran 4 mg prior to meals for nausea  6. Major depression recurrent chronic: is emotionally stable will continue celexa 20 mg daily and remeron 15 mg nightly   7. Age related osteoporosis without current pathological fracture: is stable will continue prolia 60 mg every 6 months       MD is aware of resident's narcotic use and is in agreement with current plan of care. We will attempt to wean resident as apropriate   Synthia Innocenteborah Anthonee Gelin NP [facility]  Contact [phone] Monday through Friday 8am- 5pm  After hours call [phone]

## 2019-03-05 ENCOUNTER — Encounter: Payer: Self-pay | Admitting: Adult Health

## 2019-03-05 ENCOUNTER — Non-Acute Institutional Stay (SKILLED_NURSING_FACILITY): Payer: Medicare Other | Admitting: Adult Health

## 2019-03-05 DIAGNOSIS — R1312 Dysphagia, oropharyngeal phase: Secondary | ICD-10-CM

## 2019-03-05 DIAGNOSIS — F339 Major depressive disorder, recurrent, unspecified: Secondary | ICD-10-CM | POA: Diagnosis not present

## 2019-03-05 DIAGNOSIS — F0390 Unspecified dementia without behavioral disturbance: Secondary | ICD-10-CM

## 2019-03-05 NOTE — Progress Notes (Signed)
Location:   [name]  Nursing Home Room Number: [address] Place of Service:  SNF (31)   CODE STATUS: DNR  No Known Allergies  Chief Complaint  Patient presents with  . Acute Visit    Care Plan Meeting    HPI:  We have come together for her routine care plan meeting. Her BIMS is 7/15. There are no reports of weight loss. No reports of uncontrolled pain;no changes in her appetite; no anxiety or agitation present. There are no reports of falls; no reports of fevers.    Past Medical History:  Diagnosis Date  . Asthma   . Dementia (HCC)   . Diabetes mellitus   . H. pylori infection MAR 2014  . Hypertension   . Stroke [facility])     Past Surgical History:  Procedure Laterality Date  . CHOLECYSTECTOMY    . COLECTOMY     secondary to diverticulitis  . ESOPHAGOGASTRODUODENOSCOPY (EGD) WITH ESOPHAGEAL DILATION N/A 01/07/2013   PHX:[id number] ring was found at the gastroesophageal junction/SINGLE DUODENAL DIVERTICULUM    Social History   Socioeconomic History  . Marital status: Married    Spouse name: Not on file  . Number of children: Not on file  . Years of education: Not on file  . Highest education level: Not on file  Occupational History  . Not on file  Social Needs  . Financial resource strain: Not hard at all  . Food insecurity:    Worry: Never true    Inability: Never true  . Transportation needs:    Medical: No    Non-medical: No  Tobacco Use  . Smoking status: Never Smoker  . Smokeless tobacco: Never Used  Substance and Sexual Activity  . Alcohol use: No  . Drug use: No  . Sexual activity: Never  Lifestyle  . Physical activity:    Days per week: 0 days    Minutes per session: 0 min  . Stress: To some extent  Relationships  . Social connections:    Talks on phone: Never    Gets together: Never    Attends religious service: Never    Active member of club or organization: No    Attends meetings of clubs or organizations: Never   Relationship status: Widowed  . Intimate partner violence:    Fear of current or ex partner: No    Emotionally abused: No    Physically abused: No    Forced sexual activity: No  Other Topics Concern  . Not on file  Social History Narrative  . Not on file   Family History  Problem Relation Age of Onset  . Colon cancer Neg Hx       VITAL SIGNS Ht 5\' 5"  (1.651 m)   Wt 118 lb (53.5 kg)   BMI 19.64 kg/m   Outpatient Encounter Medications as of 03/05/2019  Medication Sig  . Balsam [place]-Castor Oil (VENELEX) OINT Apply to sacrum and bilateral buttocks q shift & prn every shift  . Calcium Carbonate-Vitamin D (CALCIUM 500/VITAMIN D PO) Take 500 mg by mouth 2 (two) times daily.  . citalopram (CELEXA) 20 MG tablet Take 20 mg by mouth daily.   Marland Kitchen denosumab (PROLIA) 60 MG/ML SOLN injection Inject 60 mg into the skin every 6 (six) months. Administer in upper arm, thigh, or abdomen  . ipratropium (ATROVENT) 0.02 % nebulizer solution Take 0.5 mg by nebulization every 4 (four) hours as needed for wheezing or shortness of breath.  . loratadine (CLARITIN)  10 MG tablet Take 10 mg by mouth daily.  . memantine (NAMENDA) 10 MG tablet Take 10 mg by mouth 2 (two) times daily.  . Menthol, Topical Analgesic, (BIOFREEZE) 4 % GEL Apply 1 application topically daily as needed (for knee pain management). Apply prn to knees for pain management as needed daily   . mirtazapine (REMERON) 15 MG tablet Take 15 mg by mouth at bedtime.  . NON FORMULARY Diet Type:  Continue patient with Dysphagia 2 with honey thickened liquids.  Add ground meat to lunch and dinner trays - no sausage, no gravy on meat - gravy in bowls.  Continue HTL  . NON FORMULARY Magic Cup three times daily between meals for unplanned weight loss  . nystatin cream (MYCOSTATIN) Apply topically to irritation of buttocks every shift as needed until cleared  . omeprazole (PRILOSEC) 40 MG capsule Take 40 mg by mouth 2 (two) times daily.   . ondansetron  (ZOFRAN) 4 MG tablet Take 4 mg by mouth 3 (three) times daily before meals.    No facility-administered encounter medications on file as of 03/05/2019.      SIGNIFICANT DIAGNOSTIC EXAMS  LABS REVIEWED TODAY:   11-19-18; wbc 5.2; hgb 12.0; hct 38.4; mcv 100.5 plt 255; glucose 89; bun 13; creat 0.76; k+ 3.6; na++ 142; ca 8.3 hgb a1c 5.5 02-05-19: urine micro-albumin  126.8  NO NEW LABS.   Review of Systems  Unable to perform ROS: Dementia (unable to participate )    Physical Exam Constitutional:      General: She is not in acute distress.    Appearance: She is well-developed. She is not diaphoretic.     Comments: thin  Neck:     Musculoskeletal: Neck supple.     Thyroid: No thyromegaly.  Cardiovascular:     Rate and Rhythm: Normal rate and regular rhythm.     Pulses: Normal pulses.     Heart sounds: Normal heart sounds.  Pulmonary:     Effort: Pulmonary effort is normal. No respiratory distress.     Breath sounds: Normal breath sounds.  Abdominal:     General: Bowel sounds are normal. There is no distension.     Palpations: Abdomen is soft.     Tenderness: There is no abdominal tenderness.  Musculoskeletal: Normal range of motion.     Right lower leg: No edema.     Left lower leg: No edema.  Lymphadenopathy:     Cervical: No cervical adenopathy.  Skin:    General: Skin is warm and dry.  Neurological:     Mental Status: She is alert. Mental status is at baseline.  Psychiatric:        Mood and Affect: Mood normal.      ASSESSMENT/ PLAN:   TODAY;   1.  Dysphagia oropharyngeal phase 2. Dementia without behavioral disturbance unspecified dementia type 3. Major depression recurrent chronic   Will continue current medication regimen Will continue current plan of care  Will continue to monitor her status.      MD is aware of resident's narcotic use and is in agreement with current plan of care. We will attempt to wean resident as apropriate   Synthia Innocenteborah Green NP  [facility]  Contact [phone] Monday through Friday 8am- 5pm  After hours call [phone]

## 2019-03-24 ENCOUNTER — Encounter: Payer: Self-pay | Admitting: Internal Medicine

## 2019-03-24 ENCOUNTER — Non-Acute Institutional Stay (SKILLED_NURSING_FACILITY): Payer: Medicare Other | Admitting: Internal Medicine

## 2019-03-24 DIAGNOSIS — K224 Dyskinesia of esophagus: Secondary | ICD-10-CM

## 2019-03-24 DIAGNOSIS — F0390 Unspecified dementia without behavioral disturbance: Secondary | ICD-10-CM | POA: Diagnosis not present

## 2019-03-24 DIAGNOSIS — F339 Major depressive disorder, recurrent, unspecified: Secondary | ICD-10-CM

## 2019-03-24 DIAGNOSIS — M810 Age-related osteoporosis without current pathological fracture: Secondary | ICD-10-CM | POA: Diagnosis not present

## 2019-03-24 NOTE — Progress Notes (Signed)
Location:  [facility] Room Number: [place] of Service:  SNF [phone]) Provider:  Veleta Miners, MD  Virgie Dad, MD  Patient Care Team: Virgie Dad, MD as PCP - General (Internal Medicine) Rolm Baptise as Physician Assistant (Internal Medicine)  Extended Emergency Contact Information Primary Emergency Contact: [place] Phone: [phone] Relation: Son Secondary Emergency Contact: Alfonse [place] Phone: [phone] Mobile Phone: [phone] Relation: Son  Code Status:  DNR Goals of care: Advanced Directive information Advanced Directives 03/24/2019  Does Patient Have a Medical Advance Directive? Yes  Type of Advance Directive Out of facility DNR (pink MOST or yellow form)  Does patient want to make changes to medical advance directive? No - Patient declined  Copy of [place] in Chart? -  Would patient like information on creating a medical advance directive? No - Patient declined  Pre-existing out of facility DNR order (yellow form or pink MOST form) Yellow form placed in chart (order not valid for inpatient use)     Chief Complaint  Patient presents with  . Medical Management of Chronic Issues    Depression, GERD  . Best Practice Recommendations    Due for Eye exam    HPI:  Pt is a 85 y.o. female seen today for medical management of chronic diseases.   Patient has H/O Hypertension, recurrent Vomiting, Falls, GERD and Dementia. And Osteoporosis. She is Long term resident of facility Patient is doing well in facility She was talking and very communicative today. Usually stays quite. Weight is stable Eating well.  Past Medical History:  Diagnosis Date  . Asthma   . Dementia ([place])   . Diabetes mellitus   . H. pylori infection MAR 2014  . Hypertension   . Stroke [facility])    Past Surgical History:  Procedure Laterality Date  . CHOLECYSTECTOMY     . COLECTOMY     secondary to diverticulitis  . ESOPHAGOGASTRODUODENOSCOPY (EGD) WITH ESOPHAGEAL DILATION N/A 01/07/2013   WCB:[id number] ring was found at the gastroesophageal junction/SINGLE DUODENAL DIVERTICULUM    No Known Allergies  Outpatient Encounter Medications as of 03/24/2019  Medication Sig  . Balsam [place]-Castor Oil (VENELEX) OINT Apply to sacrum and bilateral buttocks q shift & prn every shift  . Calcium Carbonate-Vitamin D (CALCIUM 500/VITAMIN D PO) Take 500 mg by mouth 2 (two) times daily.  . citalopram (CELEXA) 20 MG tablet Take 20 mg by mouth daily.   Marland Kitchen denosumab (PROLIA) 60 MG/ML SOLN injection Inject 60 mg into the skin every 6 (six) months. Administer in upper arm, thigh, or abdomen  . ipratropium (ATROVENT) 0.02 % nebulizer solution Take 0.5 mg by nebulization every 4 (four) hours as needed for wheezing or shortness of breath.  . loratadine (CLARITIN) 10 MG tablet Take 10 mg by mouth daily.  . memantine (NAMENDA) 10 MG tablet Take 10 mg by mouth 2 (two) times daily.  . Menthol, Topical Analgesic, (BIOFREEZE) 4 % GEL Apply 1 application topically daily as needed (for knee pain management). Apply prn to knees for pain management as needed daily   . mirtazapine (REMERON) 15 MG tablet Take 15 mg by mouth at bedtime.  . NON FORMULARY Diet Type:  Continue patient with Dysphagia 2 with honey thickened liquids.  Add ground meat to lunch and dinner trays - no sausage, no gravy on meat - gravy in bowls.  Continue HTL  . NON FORMULARY Magic  Cup three times daily between meals for unplanned weight loss  . nystatin cream (MYCOSTATIN) Apply topically to irritation of buttocks every shift as needed until cleared  . omeprazole (PRILOSEC) 40 MG capsule Take 40 mg by mouth 2 (two) times daily.   . ondansetron (ZOFRAN) 4 MG tablet Take 4 mg by mouth 3 (three) times daily before meals.    No facility-administered encounter medications on file as of 03/24/2019.     Review of Systems  Unable to  perform ROS: Dementia    Immunization History  Administered Date(s) Administered  . Influenza, High Dose Seasonal PF 06-08-18  . Influenza-Unspecified 07/20/2016, 07/20/2017, 07/18/2018  . Pneumococcal Conjugate-13 06/12/2017  . Pneumococcal-Unspecified 07/24/2016  . Tdap 07/12/2017   Pertinent  Health Maintenance Due  Topic Date Due  . OPHTHALMOLOGY EXAM  03/28/2019 (Originally 04/05/2018)  . INFLUENZA VACCINE  05/17/2019  . HEMOGLOBIN A1C  05/20/2019  . FOOT EXAM  11/22/2019  . URINE MICROALBUMIN  02/05/2020  . DEXA SCAN  Completed  . PNA vac Low Risk Adult  Completed   Fall Risk  07/23/2018 07/12/2017  Falls in the past year? No No   Functional Status Survey:    Vitals:   03/24/19 0924  BP: (!) 104/41  Pulse: 72  Temp: 97.8 F (36.6 C)  Weight: 118 lb 3.2 oz (53.6 kg)  Height: 5\' 5"  (1.651 m)   Body mass index is 19.67 kg/m. Physical Exam Vitals signs reviewed.  Constitutional:      Appearance: Normal appearance.  HENT:     Head: Normocephalic.     Nose: Nose normal.     Mouth/Throat:     Mouth: Mucous membranes are moist.     Pharynx: Oropharynx is clear.  Eyes:     Pupils: Pupils are equal, round, and reactive to light.  Neck:     Musculoskeletal: Neck supple.  Cardiovascular:     Rate and Rhythm: Normal rate and regular rhythm.     Pulses: Normal pulses.     Heart sounds: Normal heart sounds.  Pulmonary:     Effort: Pulmonary effort is normal. No respiratory distress.     Breath sounds: Normal breath sounds. No wheezing.  Abdominal:     General: Abdomen is flat. Bowel sounds are normal.     Palpations: Abdomen is soft.  Musculoskeletal:        General: No swelling.  Skin:    General: Skin is warm and dry.  Neurological:     General: No focal deficit present.     Mental Status: She is alert.     Comments: She is wheelchair dependent Follows all commands and answers Appropriately   Psychiatric:        Mood and Affect: Mood normal.         Thought Content: Thought content normal.        Judgment: Judgment normal.     Labs reviewed: Recent Labs    07/11/18 0700 08/29/18 0700 11/19/18 0700  NA 141 140 142  K 4.0 3.7 3.6  CL 108 106 109  CO2 24 27 26   GLUCOSE 104* 79 89  BUN 8 10 13   CREATININE 0.65 0.71 0.76  CALCIUM 8.6* 8.0* 8.3*   Recent Labs    06/30/18 1855  AST 23  ALT 11  ALKPHOS 53  BILITOT 0.6  PROT 6.5  ALBUMIN 3.2*   Recent Labs    07/11/18 0700 08/29/18 0700 11/19/18 0700  WBC 7.5 6.0 5.2  NEUTROABS 4.4 3.4  2.7  HGB 12.9 12.3 12.0  HCT 39.3 39.1 38.4  MCV 91.2 96.3 100.5*  PLT 298 210 255   Lab Results  Component Value Date   TSH 1.030 09/11/2017   Lab Results  Component Value Date   HGBA1C 5.5 11/19/2018   No results found for: CHOL, HDL, LDLCALC, LDLDIRECT, TRIG, CHOLHDL  Significant Diagnostic Results in last 30 days:  No results found.  Assessment/Plan  Essential hypertension Not on any Meds Age-related osteoporosis TScore of -3.67 in 10/18 On Prolia  Esophageal dysmotility Stable on Zofran and Prilosec Doing well Depression Patient stays on Remeron and Celexa Has  gained weight  Dementia Stable on Namenda Hyperglycemia Patient has a?history of diabetes. Her blood sugars always have been less than 100 Last A1C was 5.5 in 2/20 Not on any Hypoglycemic H/O Anemia with Occult Positive Discussion with her son patient is not a candidate for any aggressive work-up Discontinued Iron to avoid GI side effects on her . Her Hgb is Normal right now. She does have Macrocytosis mild Will Get Vit B12 level with next Blood draw ACP Patient is very frail with Dementia. She is also [place] witness She is not candidate for any aggressive treatment. She is DNR right now  Family/ staff Communication:   Labs/tests ordered: BMP and CBC and Vit B12   Total time spent in this patient care encounter was  25_  minutes; greater than 50% of the visit spent counseling  patient and staff, reviewing records , Labs and coordinating care for problems addressed at this encounter.

## 2019-03-25 ENCOUNTER — Encounter (HOSPITAL_COMMUNITY)
Admission: RE | Admit: 2019-03-25 | Discharge: 2019-03-25 | Disposition: A | Discharge status: Home / Self Care | Payer: Medicare Other | Source: Skilled Nursing Facility | Attending: Internal Medicine | Admitting: Internal Medicine

## 2019-03-25 DIAGNOSIS — F0390 Unspecified dementia without behavioral disturbance: Secondary | ICD-10-CM | POA: Diagnosis present

## 2019-03-25 DIAGNOSIS — Z5189 Encounter for other specified aftercare: Secondary | ICD-10-CM | POA: Insufficient documentation

## 2019-03-25 DIAGNOSIS — B379 Candidiasis, unspecified: Secondary | ICD-10-CM | POA: Diagnosis present

## 2019-03-25 DIAGNOSIS — D649 Anemia, unspecified: Secondary | ICD-10-CM | POA: Insufficient documentation

## 2019-03-25 DIAGNOSIS — K219 Gastro-esophageal reflux disease without esophagitis: Secondary | ICD-10-CM | POA: Insufficient documentation

## 2019-03-25 LAB — BASIC METABOLIC PANEL
Anion gap: 11 (ref 5–15)
BUN: 13 mg/dL (ref 8–23)
CO2: 29 mmol/L (ref 22–32)
Calcium: 9.4 mg/dL (ref 8.9–10.3)
Chloride: 102 mmol/L (ref 98–111)
Creatinine, Ser: 0.86 mg/dL (ref 0.44–1.00)
GFR calc Af Amer: >60 mL/min (ref >60)
GFR calc non Af Amer: >60 mL/min (ref >60)
Glucose, Bld: 118 mg/dL — ABNORMAL HIGH (ref 70–99)
Potassium: 4.7 mmol/L (ref 3.5–5.1)
Sodium: 142 mmol/L (ref 135–145)

## 2019-03-25 LAB — VITAMIN B12: Vitamin B-12: 361 pg/mL (ref 180–914)

## 2019-03-25 LAB — CBC
HCT: 44.7 % (ref 36.0–46.0)
Hemoglobin: 14.2 g/dL (ref 12.0–15.0)
MCH: 32.1 pg (ref 26.0–34.0)
MCHC: 31.8 g/dL (ref 30.0–36.0)
MCV: 100.9 fL — ABNORMAL HIGH (ref 80.0–100.0)
Platelets: 263 10*3/uL (ref 150–400)
RBC: 4.43 MIL/uL (ref 3.87–5.11)
RDW: 12.9 % (ref 11.5–15.5)
WBC: 7.7 10*3/uL (ref 4.0–10.5)
nRBC: 0 % (ref 0.0–0.2)

## 2019-03-27 ENCOUNTER — Encounter: Payer: Self-pay | Admitting: Internal Medicine

## 2019-03-27 NOTE — Progress Notes (Deleted)
Location:  [facility] Home Room Number: [address] Place of Service:  SNF [phone](31) Provider:  Einar CrowAnjali Gupta, MD  [name], [facility]rina L, MD  Patient Care Team: [name], [facility]rina L, MD as PCP - General (Internal Medicine) Synetta ShadowLassen, Sabrina C, PA-C as Physician Assistant (Internal Medicine)  Extended Emergency Contact Information Primary Emergency Contact: [name],[name]  [place] Home Phone: [phone] Relation: Son Secondary Emergency Contact: [name],[name]  [place] Home Phone: [phone] Mobile Phone: [phone] Relation: Son  Code Status:  DNR Goals of care: Advanced Directive information Advanced Directives 03/27/2019  Does Patient Have a Medical Advance Directive? Yes  Type of Advance Directive Out of facility DNR (pink MOST or yellow form)  Does patient want to make changes to medical advance directive? No - Patient declined  Copy of Healthcare Power of Attorney in Chart? -  Would patient like information on creating a medical advance directive? No - Patient declined  Pre-existing out of facility DNR order (yellow form or pink MOST form) Yellow form placed in chart (order not valid for inpatient use)     Chief Complaint  Patient presents with  . Medical Management of Chronic Issues    Depression, GERD    HPI:  Pt is a 85 y.o. female seen today for medical management of chronic diseases.     Past Medical History:  Diagnosis Date  . Asthma   . Dementia (HCC)   . Diabetes mellitus   . H. pylori infection MAR 2014  . Hypertension   . Stroke [facility](HCC)    Past Surgical History:  Procedure Laterality Date  . CHOLECYSTECTOMY    . COLECTOMY     secondary to diverticulitis  . ESOPHAGOGASTRODUODENOSCOPY (EGD) WITH ESOPHAGEAL DILATION N/A 01/07/2013   VZD:[id number]:[place] ring was found at the gastroesophageal junction/SINGLE DUODENAL DIVERTICULUM    No Known Allergies  Outpatient Encounter Medications as of 03/27/2019  Medication Sig  . Balsam  [place]-Castor Oil (VENELEX) OINT Apply to sacrum and bilateral buttocks q shift & prn every shift  . Calcium Carbonate-Vitamin D (CALCIUM 500/VITAMIN D PO) Take 500 mg by mouth 2 (two) times daily.  . citalopram (CELEXA) 20 MG tablet Take 20 mg by mouth daily.   Marland Kitchen. denosumab (PROLIA) 60 MG/ML SOLN injection Inject 60 mg into the skin every 6 (six) months. Administer in upper arm, thigh, or abdomen  . ipratropium (ATROVENT) 0.02 % nebulizer solution Take 0.5 mg by nebulization every 4 (four) hours as needed for wheezing or shortness of breath.  . loratadine (CLARITIN) 10 MG tablet Take 10 mg by mouth daily.  . memantine (NAMENDA) 10 MG tablet Take 10 mg by mouth 2 (two) times daily.  . Menthol, Topical Analgesic, (BIOFREEZE) 4 % GEL Apply 1 application topically daily as needed (for knee pain management). Apply prn to knees for pain management as needed daily   . mirtazapine (REMERON) 15 MG tablet Take 15 mg by mouth at bedtime.  . NON FORMULARY Diet Type:  Continue patient with Dysphagia 2 with honey thickened liquids.  Add ground meat to lunch and dinner trays - no sausage, no gravy on meat - gravy in bowls.  Continue HTL  . NON FORMULARY Magic Cup three times daily between meals for unplanned weight loss  . nystatin cream (MYCOSTATIN) Apply topically to irritation of buttocks every shift as needed until cleared  . omeprazole (PRILOSEC) 40 MG capsule Take 40 mg by mouth 2 (two) times daily.   . ondansetron (ZOFRAN) 4 MG tablet  Take 4 mg by mouth 3 (three) times daily before meals.    No facility-administered encounter medications on file as of 03/27/2019.     Review of Systems  Immunization History  Administered Date(s) Administered  . Influenza, High Dose Seasonal PF 07/04/2018  . Influenza-Unspecified 07/20/2016, 07/20/2017, 07/18/2018  . Pneumococcal Conjugate-13 06/12/2017  . Pneumococcal-Unspecified 07/24/2016  . Tdap 07/12/2017   Pertinent  Health Maintenance Due  Topic Date Due  .  OPHTHALMOLOGY EXAM  03/28/2019 (Originally 04/05/2018)  . INFLUENZA VACCINE  05/17/2019  . HEMOGLOBIN A1C  05/20/2019  . FOOT EXAM  11/22/2019  . URINE MICROALBUMIN  02/05/2020  . DEXA SCAN  Completed  . PNA vac Low Risk Adult  Completed   Fall Risk  07/23/2018 07/12/2017  Falls in the past year? No No   Functional Status Survey:    Vitals:   03/27/19 0958  Weight: 118 lb 3.2 oz (53.6 kg)  Height: 5\' 5"  (1.651 m)   Body mass index is 19.67 kg/m. Physical Exam  Labs reviewed: Recent Labs    08/29/18 0700 11/19/18 0700 03/25/19 0724  NA 140 142 142  K 3.7 3.6 4.7  CL 106 109 102  CO2 27 26 29   GLUCOSE 79 89 118*  BUN 10 13 13   CREATININE 0.71 0.76 0.86  CALCIUM 8.0* 8.3* 9.4   Recent Labs    06/30/18 1855  AST 23  ALT 11  ALKPHOS 53  BILITOT 0.6  PROT 6.5  ALBUMIN 3.2*   Recent Labs    07/11/18 0700 08/29/18 0700 11/19/18 0700 03/25/19 0724  WBC 7.5 6.0 5.2 7.7  NEUTROABS 4.4 3.4 2.7  --   HGB 12.9 12.3 12.0 14.2  HCT 39.3 39.1 38.4 44.7  MCV 91.2 96.3 100.5* 100.9*  PLT 298 210 255 263   Lab Results  Component Value Date   TSH 1.030 09/11/2017   Lab Results  Component Value Date   HGBA1C 5.5 11/19/2018   No results found for: CHOL, HDL, LDLCALC, LDLDIRECT, TRIG, CHOLHDL  Significant Diagnostic Results in last 30 days:  No results found.  Assessment/Plan There are no diagnoses linked to this encounter.   Family/ staff Communication: ***  Labs/tests ordered:  ***

## 2019-03-27 NOTE — Progress Notes (Signed)
Entered in error

## 2019-04-24 ENCOUNTER — Non-Acute Institutional Stay (SKILLED_NURSING_FACILITY): Payer: Medicare Other | Admitting: Adult Health

## 2019-04-24 ENCOUNTER — Encounter: Payer: Self-pay | Admitting: Adult Health

## 2019-04-24 DIAGNOSIS — J3089 Other allergic rhinitis: Secondary | ICD-10-CM | POA: Diagnosis not present

## 2019-04-24 DIAGNOSIS — M810 Age-related osteoporosis without current pathological fracture: Secondary | ICD-10-CM

## 2019-04-24 DIAGNOSIS — K219 Gastro-esophageal reflux disease without esophagitis: Secondary | ICD-10-CM | POA: Diagnosis not present

## 2019-04-24 DIAGNOSIS — I6930 Unspecified sequelae of cerebral infarction: Secondary | ICD-10-CM

## 2019-04-24 DIAGNOSIS — I10 Essential (primary) hypertension: Secondary | ICD-10-CM | POA: Diagnosis not present

## 2019-04-24 DIAGNOSIS — R1312 Dysphagia, oropharyngeal phase: Secondary | ICD-10-CM

## 2019-04-24 DIAGNOSIS — F339 Major depressive disorder, recurrent, unspecified: Secondary | ICD-10-CM

## 2019-04-24 DIAGNOSIS — F0390 Unspecified dementia without behavioral disturbance: Secondary | ICD-10-CM

## 2019-04-24 NOTE — Progress Notes (Signed)
Provider:  Synthia Innocenteborah Vella Colquitt, NP Location:  [facility] Room Number: [address] of Service:  SNF ([phone])   PCP: Mahlon GammonGupta, Anjali L, MD Patient Care Team: Mahlon GammonGupta, Anjali L, MD as PCP - General (Internal Medicine) Synetta ShadowLassen, Arlo C, PA-C as Physician Assistant (Internal Medicine)  Extended Emergency Contact Information Primary Emergency Contact: [name],[name]  [place] Home Phone: [phone] Relation: Son Secondary Emergency Contact: [name],[name]  [place] Home Phone: [phone] Mobile Phone: [phone] Relation: Son  Code Status: DNR Goals of Care: Advanced Directive information Advanced Directives 04/24/2019  Does Patient Have a Medical Advance Directive? Yes  Type of Advance Directive Out of facility DNR (pink MOST or yellow form)  Does patient want to make changes to medical advance directive? No - Patient declined  Copy of Healthcare Power of Attorney in Chart? -  Would patient like information on creating a medical advance directive? No - Patient declined  Pre-existing out of facility DNR order (yellow form or pink MOST form) Yellow form placed in chart (order not valid for inpatient use)      No Known Allergies   Chief Complaint  Patient presents with  . Annual Exam    HPI: Patient is a 85 y.o. female seen today for an annual comprehensive examination. She has been hospitalized one time in Sept 2019 for sepsis related to pneumonia. She has not had any falls over the past year. Her weight is essentially stable. There are no reports of aspiration; no reports of uncontrolled pain; no reports of agitation or anxiety.    Past Medical History:  Diagnosis Date  . Asthma   . Dementia (HCC)   . Diabetes mellitus   . H. pylori infection MAR 2014  . Hypertension   . Stroke [facility](HCC)    Past Surgical History:  Procedure Laterality Date  . CHOLECYSTECTOMY    . COLECTOMY     secondary to diverticulitis  .  ESOPHAGOGASTRODUODENOSCOPY (EGD) WITH ESOPHAGEAL DILATION N/A 01/07/2013   [id number]:Schatzki ring was found at the gastroesophageal junction/SINGLE DUODENAL DIVERTICULUM    reports that she has never smoked. She has never used smokeless tobacco. She reports that she does not drink alcohol or use drugs. Social History   Socioeconomic History  . Marital status: Married    Spouse name: Not on file  . Number of children: Not on file  . Years of education: Not on file  . Highest education level: Not on file  Occupational History  . Not on file  Social Needs  . Financial resource strain: Not hard at all  . Food insecurity    Worry: Never true    Inability: Never true  . Transportation needs    Medical: No    Non-medical: No  Tobacco Use  . Smoking status: Never Smoker  . Smokeless tobacco: Never Used  Substance and Sexual Activity  . Alcohol use: No  . Drug use: No  . Sexual activity: Not Currently  Lifestyle  . Physical activity    Days per week: 0 days    Minutes per session: 0 min  . Stress: To some extent  Relationships  . Social Musicianconnections    Talks on phone: Never    Gets together: Never    Attends religious service: Never    Active member of club or organization: No    Attends meetings of clubs or organizations: Never    Relationship status: Widowed  . Intimate partner violence    Fear  of current or ex partner: No    Emotionally abused: No    Physically abused: No    Forced sexual activity: No  Other Topics Concern  . Not on file  Social History Narrative   [facility]    Family History  Problem Relation Age of Onset  . Colon cancer Neg Hx     Vitals:   04/24/19 1139  Weight: 121 lb 12.8 oz (55.2 kg)  Height: 5\' 5"  (1.651 m)   Body mass index is 20.27 kg/m. Outpatient Encounter Medications as of 04/24/2019  Medication Sig  . Balsam [place]-Castor Oil (VENELEX) OINT Apply to sacrum and bilateral buttocks q shift & prn every shift  . Calcium  Carbonate-Vitamin D (CALCIUM 500/VITAMIN D PO) Take 500 mg by mouth 2 (two) times daily.  . citalopram (CELEXA) 10 MG tablet Take 10 mg by mouth daily.   Marland Kitchen. denosumab (PROLIA) 60 MG/ML SOLN injection Inject 60 mg into the skin every 6 (six) months. Administer in upper arm, thigh, or abdomen  . ipratropium (ATROVENT) 0.02 % nebulizer solution Take 0.5 mg by nebulization every 4 (four) hours as needed for wheezing or shortness of breath.  . loratadine (CLARITIN) 10 MG tablet Take 10 mg by mouth daily.  . memantine (NAMENDA) 10 MG tablet Take 10 mg by mouth 2 (two) times daily.  . Menthol, Topical Analgesic, (BIOFREEZE) 4 % GEL Apply 1 application topically daily as needed (for knee pain management). Apply prn to knees for pain management as needed daily   . mirtazapine (REMERON) 15 MG tablet Take 15 mg by mouth at bedtime.  . NON FORMULARY Diet Type:  Continue patient with Dysphagia 2 with honey thickened liquids.  Add ground meat to lunch and dinner trays - no sausage, no gravy on meat - gravy in bowls.  Continue HTL  . NON FORMULARY Magic Cup three times daily between meals for unplanned weight loss  . nystatin cream (MYCOSTATIN) Apply topically to irritation of buttocks every shift as needed until cleared  . omeprazole (PRILOSEC) 40 MG capsule Take 40 mg by mouth 2 (two) times daily.   . ondansetron (ZOFRAN) 4 MG tablet Take 4 mg by mouth 3 (three) times daily before meals.    No facility-administered encounter medications on file as of 04/24/2019.      SIGNIFICANT DIAGNOSTIC EXAMS  LABS REVIEWED TODAY:   11-19-18; wbc 5.2; hgb 12.0; hct 38.4; mcv 100.5 plt 255; glucose 89; bun 13; creat 0.76; k+ 3.6; na++ 142; ca 8.3 hgb a1c 5.5 02-05-19: urine micro-albumin  126.8  TODAY:   03-25-19: wbc 7.7; hgb 14.2; hct 44.7; mcv 100.9; plt 263; glucose 118; bun 13; creat 0.86; k+ 4.7; an++ 142; ca 9.4; vit B 12: 361    Review of Systems  Unable to perform ROS: Dementia (unable to participate )    Physical Exam Constitutional:      General: She is not in acute distress.    Appearance: She is well-developed. She is not diaphoretic.     Comments: thin  HENT:     Right Ear: Ear canal and external ear normal.     Left Ear: Ear canal and external ear normal.     Nose: Nose normal.     Mouth/Throat:     Mouth: Mucous membranes are moist.     Pharynx: Oropharynx is clear.  Eyes:     Conjunctiva/sclera: Conjunctivae normal.  Neck:     Musculoskeletal: Neck supple.  Thyroid: No thyromegaly.     Vascular: No carotid bruit.  Cardiovascular:     Rate and Rhythm: Normal rate and regular rhythm.     Pulses: Normal pulses.     Heart sounds: Normal heart sounds.  Pulmonary:     Effort: Pulmonary effort is normal. No respiratory distress.     Breath sounds: Normal breath sounds.  Abdominal:     General: Bowel sounds are normal. There is no distension.     Palpations: Abdomen is soft.     Tenderness: There is no abdominal tenderness.  Musculoskeletal: Normal range of motion.     Right lower leg: No edema.     Left lower leg: No edema.  Lymphadenopathy:     Cervical: No cervical adenopathy.  Skin:    General: Skin is warm and dry.  Neurological:     Mental Status: She is alert. Mental status is at baseline.  Psychiatric:        Mood and Affect: Mood normal.      ASSESSMENT/ PLAN:   TODAY;   1. Chronic non-seasonal allergic rhinitis: is stable will continue claritin 10 mg daily has atrovent neb every 4 hours as needed  2. GERD without esophagitis: is stable will continue prilosec 40 mg twice daily and zofran 4 mg prior to meals.   3. Major depression recurrent chronic: is emotionally stable will continue remeron 15 mg nightly and celexa 10 mg daily has tolerated the dose reduction.   4. Age related osteoporosis without current pathological fracture: is stable will continue prolia 60 mg every 6 months and calcium supplements  5. Unspecified type dementia without  behavioral disturbance: is without change: weight is stable at 121 (previous 118, 117) pounds. Will continue namenda 10 mg twice daily   6. Essential hypertension: is stable will continue to monitor her status.   7. Dysphagia oropharyngeal phase: no signs of aspiration present; will continue honey thick liquids.   8. Chronic CVA: is neurologically stable will continue to monitor her status.   Will setup for eye when services allowed back into the facility.  Her health maintenance is up to date.     MD is aware of resident's narcotic use and is in agreement with current plan of care. We will wean dosage as appropriate for resident   Ok Edwards NP [facility]  Contact [phone] Monday through Friday 8am- 5pm  After hours call [phone]

## 2019-04-26 ENCOUNTER — Encounter: Payer: Self-pay | Admitting: Adult Health

## 2019-04-26 DIAGNOSIS — I6930 Unspecified sequelae of cerebral infarction: Secondary | ICD-10-CM | POA: Insufficient documentation

## 2019-05-23 ENCOUNTER — Non-Acute Institutional Stay (SKILLED_NURSING_FACILITY): Payer: Medicare Other | Admitting: Adult Health

## 2019-05-23 ENCOUNTER — Encounter: Payer: Self-pay | Admitting: Adult Health

## 2019-05-23 DIAGNOSIS — I10 Essential (primary) hypertension: Secondary | ICD-10-CM | POA: Diagnosis not present

## 2019-05-23 DIAGNOSIS — F0390 Unspecified dementia without behavioral disturbance: Secondary | ICD-10-CM | POA: Diagnosis not present

## 2019-05-23 DIAGNOSIS — M810 Age-related osteoporosis without current pathological fracture: Secondary | ICD-10-CM

## 2019-05-23 NOTE — Progress Notes (Signed)
Location:   [facility] Room Number: 115 w Place of Service:  SNF (31)   CODE STATUS: DNR  No Known Allergies  Chief Complaint  Patient presents with  . Medical Management of Chronic Issues        Age related osteoporosis without current pathological fracture:   Unspecified type dementia without behavioral disturbance:  Essential hypertension:     HPI:  She is a 85 year old long term resident of this facility being seen for the management of her chronic illnesses; osteoporosis; dementia; hypertension. There are no reports of changes in her appetite; her weight is stable. There are no reports of uncontrolled pain. There are no reports of agitation or anxiety.   Past Medical History:  Diagnosis Date  . Asthma   . Dementia (HCC)   . Diabetes mellitus   . H. pylori infection MAR 2014  . Hypertension   . Stroke [facility](HCC)     Past Surgical History:  Procedure Laterality Date  . CHOLECYSTECTOMY    . COLECTOMY     secondary to diverticulitis  . ESOPHAGOGASTRODUODENOSCOPY (EGD) WITH ESOPHAGEAL DILATION N/A 01/07/2013   [id number]:Schatzki ring was found at the gastroesophageal junction/SINGLE DUODENAL DIVERTICULUM    Social History   Socioeconomic History  . Marital status: Married    Spouse name: Not on file  . Number of children: Not on file  . Years of education: Not on file  . Highest education level: Not on file  Occupational History  . Not on file  Social Needs  . Financial resource strain: Not hard at all  . Food insecurity    Worry: Never true    Inability: Never true  . Transportation needs    Medical: No    Non-medical: No  Tobacco Use  . Smoking status: Never Smoker  . Smokeless tobacco: Never Used  Substance and Sexual Activity  . Alcohol use: No  . Drug use: No  . Sexual activity: Not Currently  Lifestyle  . Physical activity    Days per week: 0 days    Minutes per session: 0 min  . Stress: Not at all  Relationships  . Social  Musicianconnections    Talks on phone: Never    Gets together: Never    Attends religious service: Never    Active member of club or organization: No    Attends meetings of clubs or organizations: Never    Relationship status: Widowed  . Intimate partner violence    Fear of current or ex partner: No    Emotionally abused: No    Physically abused: No    Forced sexual activity: No  Other Topics Concern  . Not on file  Social History Narrative   [facility]    Family History  Problem Relation Age of Onset  . Colon cancer Neg Hx       VITAL SIGNS BP 124/66   Pulse 68   Temp 98.2 F (36.8 C)   Resp 20   Ht 5\' 5"  (1.651 m)   Wt 123 lb 6.4 oz (56 kg)   BMI 20.53 kg/m   Outpatient Encounter Medications as of 05/23/2019  Medication Sig  . Balsam [place]-Castor Oil (VENELEX) OINT Apply to sacrum and bilateral buttocks q shift & prn every shift  . Calcium Carbonate-Vitamin D (CALCIUM 500/VITAMIN D PO) Take 500 mg by mouth 2 (two) times daily.  . citalopram (CELEXA) 10 MG tablet Take 10 mg by  mouth daily.   Marland Kitchen. denosumab (PROLIA) 60 MG/ML SOLN injection Inject 60 mg into the skin every 6 (six) months. Administer in upper arm, thigh, or abdomen  . ipratropium (ATROVENT) 0.02 % nebulizer solution Take 0.5 mg by nebulization every 4 (four) hours as needed for wheezing or shortness of breath.  . loratadine (CLARITIN) 10 MG tablet Take 10 mg by mouth daily.  . memantine (NAMENDA) 10 MG tablet Take 10 mg by mouth 2 (two) times daily.  . Menthol, Topical Analgesic, (BIOFREEZE) 4 % GEL Apply 1 application topically daily as needed (for knee pain management). Apply prn to knees for pain management as needed daily   . mirtazapine (REMERON) 15 MG tablet Take 15 mg by mouth at bedtime.  . NON FORMULARY Diet Type:  Continue patient with Dysphagia 2 with honey thickened liquids.  Add ground meat to lunch and dinner trays - no sausage, no gravy on meat - gravy in bowls.  Continue HTL  . NON  FORMULARY Magic Cup three times daily between meals for unplanned weight loss  . nystatin cream (MYCOSTATIN) Apply topically to irritation of buttocks every shift as needed until cleared  . omeprazole (PRILOSEC) 40 MG capsule Take 40 mg by mouth 2 (two) times daily.   . ondansetron (ZOFRAN) 4 MG tablet Take 4 mg by mouth 3 (three) times daily before meals.    No facility-administered encounter medications on file as of 05/23/2019.      SIGNIFICANT DIAGNOSTIC EXAMS  LABS REVIEWED PREVIOUS   11-19-18; wbc 5.2; hgb 12.0; hct 38.4; mcv 100.5 plt 255; glucose 89; bun 13; creat 0.76; k+ 3.6; na++ 142; ca 8.3 hgb a1c 5.5 02-05-19: urine micro-albumin  126.8 03-25-19: wbc 7.7; hgb 14.2; hct 44.7; mcv 100.9; plt 263; glucose 118; bun 13; creat 0.86; k+ 4.7; an++ 142; ca 9.4; vit B 12: 361   NO NEW LABS.   Review of Systems  Unable to perform ROS: Dementia (unable to participate )    Physical Exam Constitutional:      General: She is not in acute distress.    Appearance: She is well-developed. She is not diaphoretic.     Comments: thin  Neck:     Musculoskeletal: Neck supple.     Thyroid: No thyromegaly.  Cardiovascular:     Rate and Rhythm: Normal rate and regular rhythm.     Pulses: Normal pulses.     Heart sounds: Normal heart sounds.  Pulmonary:     Effort: Pulmonary effort is normal. No respiratory distress.     Breath sounds: Normal breath sounds.  Abdominal:     General: Bowel sounds are normal. There is no distension.     Palpations: Abdomen is soft.     Tenderness: There is no abdominal tenderness.  Musculoskeletal: Normal range of motion.     Right lower leg: No edema.     Left lower leg: No edema.  Lymphadenopathy:     Cervical: No cervical adenopathy.  Skin:    General: Skin is warm and dry.  Neurological:     Mental Status: She is alert. Mental status is at baseline.  Psychiatric:        Mood and Affect: Mood normal.       ASSESSMENT/ PLAN:   TODAY;   1. Age  related osteoporosis without current pathological fracture: is stable will continue prolia 60 mg every 6 months and supplements  2. Unspecified type dementia without behavioral disturbance: is without change: weight is stable at 123 (  previous 191,478,295) pounds; will continue namenda 10 mg twice daily   3. Essential hypertension: is stable b/p 124/66 will continue to monitor her status.    PREVIOUS   4. Dysphagia oropharyngeal phase: no signs of aspiration present; will continue honey thick liquids.   5. Chronic CVA: is neurologically stable will continue to monitor her status.   6. Chronic non-seasonal allergic rhinitis: is stable will continue claritin 10 mg daily has atrovent neb every 4 hours as needed  7. GERD without esophagitis: is stable will continue prilosec 40 mg twice daily and zofran 4 mg prior to meals.   8. Major depression recurrent chronic: is emotionally stable will continue remeron 15 mg nightly and celexa 10 mg daily has tolerated the dose reduction.    MD is aware of resident's narcotic use and is in agreement with current plan of care. We will attempt to wean resident as apropriate   Ok Edwards NP [facility]  Contact [phone] Monday through Friday 8am- 5pm  After hours call [phone]

## 2019-06-04 ENCOUNTER — Encounter: Payer: Self-pay | Admitting: Adult Health

## 2019-06-04 ENCOUNTER — Non-Acute Institutional Stay (SKILLED_NURSING_FACILITY): Payer: Medicare Other | Admitting: Adult Health

## 2019-06-04 DIAGNOSIS — F0390 Unspecified dementia without behavioral disturbance: Secondary | ICD-10-CM | POA: Diagnosis not present

## 2019-06-04 DIAGNOSIS — I6930 Unspecified sequelae of cerebral infarction: Secondary | ICD-10-CM | POA: Diagnosis not present

## 2019-06-04 DIAGNOSIS — R1312 Dysphagia, oropharyngeal phase: Secondary | ICD-10-CM

## 2019-06-04 NOTE — Progress Notes (Signed)
Location:   [place] Room Number: [place] of Service:  SNF (31)   CODE STATUS: DNR  No Known Allergies  Chief Complaint  Patient presents with  . Acute Visit    Care Plan Meeting    HPI:  We have come together for a family requested care plan meeting. They are present via phone. BIMS 7/15 mood 7/30. Her family is wanting an update on her overall status. She has gained 5 pounds over the past 3 months. There are no reports of recent falls no reports of uncontrolled pain; no agitation or anxiety. No reports of changes in her appetite.    Past Medical History:  Diagnosis Date  . Asthma   . Dementia ([place])   . Diabetes mellitus   . H. pylori infection MAR 2014  . Hypertension   . Stroke [facility])     Past Surgical History:  Procedure Laterality Date  . CHOLECYSTECTOMY    . COLECTOMY     secondary to diverticulitis  . ESOPHAGOGASTRODUODENOSCOPY (EGD) WITH ESOPHAGEAL DILATION N/A 01/07/2013   [id number] ring was found at the gastroesophageal junction/SINGLE DUODENAL DIVERTICULUM    Social History   Socioeconomic History  . Marital status: Married    Spouse name: Not on file  . Number of children: Not on file  . Years of education: Not on file  . Highest education level: Not on file  Occupational History  . Not on file  Social Needs  . Financial resource strain: Not hard at all  . Food insecurity    Worry: Never true    Inability: Never true  . Transportation needs    Medical: No    Non-medical: No  Tobacco Use  . Smoking status: Never Smoker  . Smokeless tobacco: Never Used  Substance and Sexual Activity  . Alcohol use: No  . Drug use: No  . Sexual activity: Not Currently  Lifestyle  . Physical activity    Days per week: 0 days    Minutes per session: 0 min  . Stress: Not at all  Relationships  . Social Herbalist on phone: Never    Gets together: Never    Attends religious service: Never    Active member of  club or organization: No    Attends meetings of clubs or organizations: Never    Relationship status: Widowed  . Intimate partner violence    Fear of current or ex partner: No    Emotionally abused: No    Physically abused: No    Forced sexual activity: No  Other Topics Concern  . Not on file  Social History Narrative   Is a long term resident of [facility]    Family History  Problem Relation Age of Onset  . Colon cancer Neg Hx       VITAL SIGNS BP 137/73   Pulse 68   Temp 98.4 F (36.9 C)   Resp 18   Ht 5\' 5"  (1.651 m)   Wt 123 lb 6.4 oz (56 kg)   BMI 20.53 kg/m   Outpatient Encounter Medications as of 06/04/2019  Medication Sig  . Balsam [place]-Castor Oil (VENELEX) OINT Apply to sacrum and bilateral buttocks q shift & prn every shift  . Calcium Carbonate-Vitamin D (CALCIUM 500/VITAMIN D PO) Take 500 mg by mouth 2 (two) times daily.  . citalopram (CELEXA) 10 MG tablet Take 10 mg by mouth daily.   Marland Kitchen denosumab (PROLIA) 60 MG/ML SOLN injection  Inject 60 mg into the skin every 6 (six) months. Administer in upper arm, thigh, or abdomen  . ipratropium (ATROVENT) 0.02 % nebulizer solution Take 0.5 mg by nebulization every 4 (four) hours as needed for wheezing or shortness of breath.  . loratadine (CLARITIN) 10 MG tablet Take 10 mg by mouth daily.  . memantine (NAMENDA) 10 MG tablet Take 10 mg by mouth 2 (two) times daily.  . Menthol, Topical Analgesic, (BIOFREEZE) 4 % GEL Apply 1 application topically daily as needed (for knee pain management). Apply prn to knees for pain management as needed daily   . mirtazapine (REMERON) 15 MG tablet Take 15 mg by mouth at bedtime.  . NON FORMULARY Diet Type:  Continue patient with Dysphagia 2 with honey thickened liquids.  Add ground meat to lunch and dinner trays - no sausage, no gravy on meat - gravy in bowls.  Continue HTL  . NON FORMULARY Magic Cup three times daily between meals for unplanned weight loss  . nystatin cream (MYCOSTATIN) Apply  topically to irritation of buttocks every shift as needed until cleared  . omeprazole (PRILOSEC) 40 MG capsule Take 40 mg by mouth 2 (two) times daily.   . ondansetron (ZOFRAN) 4 MG tablet Take 4 mg by mouth 3 (three) times daily before meals.    No facility-administered encounter medications on file as of 06/04/2019.      SIGNIFICANT DIAGNOSTIC EXAMS  LABS REVIEWED PREVIOUS   11-19-18; wbc 5.2; hgb 12.0; hct 38.4; mcv 100.5 plt 255; glucose 89; bun 13; creat 0.76; k+ 3.6; na++ 142; ca 8.3 hgb a1c 5.5 02-05-19: urine micro-albumin  126.8 03-25-19: wbc 7.7; hgb 14.2; hct 44.7; mcv 100.9; plt 263; glucose 118; bun 13; creat 0.86; k+ 4.7; an++ 142; ca 9.4; vit B 12: 361   NO NEW LABS.    Review of Systems  Unable to perform ROS: Dementia (unable to participate )    Physical Exam Constitutional:      General: She is not in acute distress.    Appearance: She is well-developed. She is not diaphoretic.     Comments: thin  Neck:     Musculoskeletal: Neck supple.     Thyroid: No thyromegaly.  Cardiovascular:     Rate and Rhythm: Normal rate and regular rhythm.     Pulses: Normal pulses.     Heart sounds: Normal heart sounds.  Pulmonary:     Effort: Pulmonary effort is normal. No respiratory distress.     Breath sounds: Normal breath sounds.  Abdominal:     General: Bowel sounds are normal. There is no distension.     Palpations: Abdomen is soft.     Tenderness: There is no abdominal tenderness.  Musculoskeletal: Normal range of motion.     Right lower leg: No edema.     Left lower leg: No edema.  Lymphadenopathy:     Cervical: No cervical adenopathy.  Skin:    General: Skin is warm and dry.  Neurological:     Mental Status: She is alert. Mental status is at baseline.  Psychiatric:        Mood and Affect: Mood normal.     ASSESSMENT/ PLAN:   TODAY;  1. Unspecified type dementia without behavioral disturbance 2. Dysphagia oropharyngeal phase 3. Chronic CVA  Will  continue current plan of care Will continue current medications Will continue to monitor her status.        MD is aware of resident's narcotic use and is in agreement  with current plan of care. We will attempt to wean resident as apropriate   Ok Edwards NP [facility]  Contact [phone] Monday through Friday 8am- 5pm  After hours call [phone]

## 2019-06-12 ENCOUNTER — Encounter (HOSPITAL_COMMUNITY)
Admission: RE | Admit: 2019-06-12 | Discharge: 2019-06-12 | Disposition: A | Discharge status: Home / Self Care | Payer: Medicare Other | Source: Skilled Nursing Facility | Attending: Internal Medicine | Admitting: Internal Medicine

## 2019-06-12 DIAGNOSIS — B379 Candidiasis, unspecified: Secondary | ICD-10-CM | POA: Diagnosis present

## 2019-06-12 DIAGNOSIS — K219 Gastro-esophageal reflux disease without esophagitis: Secondary | ICD-10-CM | POA: Diagnosis present

## 2019-06-12 DIAGNOSIS — Z5189 Encounter for other specified aftercare: Secondary | ICD-10-CM | POA: Diagnosis present

## 2019-06-12 DIAGNOSIS — F0390 Unspecified dementia without behavioral disturbance: Secondary | ICD-10-CM | POA: Diagnosis present

## 2019-06-12 DIAGNOSIS — D649 Anemia, unspecified: Secondary | ICD-10-CM | POA: Diagnosis not present

## 2019-06-12 LAB — CBC WITH DIFFERENTIAL/PLATELET
Abs Immature Granulocytes: 0.04 10*3/uL (ref 0.00–0.07)
Basophils Absolute: 0.1 10*3/uL (ref 0.0–0.1)
Basophils Relative: 1 %
Eosinophils Absolute: 0.2 10*3/uL (ref 0.0–0.5)
Eosinophils Relative: 2 %
HCT: 42.6 % (ref 36.0–46.0)
Hemoglobin: 13.4 g/dL (ref 12.0–15.0)
Immature Granulocytes: 1 %
Lymphocytes Relative: 25 %
Lymphs Abs: 2 10*3/uL (ref 0.7–4.0)
MCH: 31.5 pg (ref 26.0–34.0)
MCHC: 31.5 g/dL (ref 30.0–36.0)
MCV: 100 fL (ref 80.0–100.0)
Monocytes Absolute: 0.7 10*3/uL (ref 0.1–1.0)
Monocytes Relative: 9 %
Neutro Abs: 5.1 10*3/uL (ref 1.7–7.7)
Neutrophils Relative %: 62 %
Platelets: 278 10*3/uL (ref 150–400)
RBC: 4.26 MIL/uL (ref 3.87–5.11)
RDW: 12.8 % (ref 11.5–15.5)
WBC: 8.1 10*3/uL (ref 4.0–10.5)
nRBC: 0 % (ref 0.0–0.2)

## 2019-06-19 ENCOUNTER — Encounter: Payer: Self-pay | Admitting: Adult Health

## 2019-06-19 ENCOUNTER — Non-Acute Institutional Stay (SKILLED_NURSING_FACILITY): Payer: Medicare Other | Admitting: Adult Health

## 2019-06-19 DIAGNOSIS — F339 Major depressive disorder, recurrent, unspecified: Secondary | ICD-10-CM

## 2019-06-19 DIAGNOSIS — F0390 Unspecified dementia without behavioral disturbance: Secondary | ICD-10-CM

## 2019-06-19 NOTE — Progress Notes (Signed)
Location:    [facility] Room Number: 115/W Place of Service:  SNF (31)   CODE STATUS: Full Code  No Known Allergies  Chief Complaint  Patient presents with  . Acute Visit    Psycho Active Medication Review    HPI:  We have come together to review her psychoactive medications. She is presently on celexa 10 mg daily and remeorn 15 mg nightly. Staff reports that she will state that she has depression. There are no reports of agitation; no reports of changes in her appetite; no reports of changes in weight.   Past Medical History:  Diagnosis Date  . Asthma   . Dementia ([place])   . Diabetes mellitus   . H. pylori infection MAR 2014  . Hypertension   . Stroke [facility])     Past Surgical History:  Procedure Laterality Date  . CHOLECYSTECTOMY    . COLECTOMY     secondary to diverticulitis  . ESOPHAGOGASTRODUODENOSCOPY (EGD) WITH ESOPHAGEAL DILATION N/A 01/07/2013   PYP:[id number] ring was found at the gastroesophageal junction/SINGLE DUODENAL DIVERTICULUM    Social History   Socioeconomic History  . Marital status: Married    Spouse name: Not on file  . Number of children: Not on file  . Years of education: Not on file  . Highest education level: Not on file  Occupational History  . Not on file  Social Needs  . Financial resource strain: Not hard at all  . Food insecurity    Worry: Never true    Inability: Never true  . Transportation needs    Medical: No    Non-medical: No  Tobacco Use  . Smoking status: Never Smoker  . Smokeless tobacco: Never Used  Substance and Sexual Activity  . Alcohol use: No  . Drug use: No  . Sexual activity: Not Currently  Lifestyle  . Physical activity    Days per week: 0 days    Minutes per session: 0 min  . Stress: Not at all  Relationships  . Social Herbalist on phone: Never    Gets together: Never    Attends religious service: Never    Active member of club or organization: No    Attends  meetings of clubs or organizations: Never    Relationship status: Widowed  . Intimate partner violence    Fear of current or ex partner: No    Emotionally abused: No    Physically abused: No    Forced sexual activity: No  Other Topics Concern  . Not on file  Social History Narrative   Is a long term resident of [facility]    Family History  Problem Relation Age of Onset  . Colon cancer Neg Hx       VITAL SIGNS BP 129/72   Pulse 69   Temp 98.4 F (36.9 C) (Oral)   Resp (!) 24   Ht 5\' 5"  (1.651 m)   Wt 125 lb 3.2 oz (56.8 kg)   SpO2 95%   BMI 20.83 kg/m   Outpatient Encounter Medications as of 06/19/2019  Medication Sig  . Balsam [place]-Castor Oil (VENELEX) OINT Apply to sacrum and bilateral buttocks q shift & prn every shift  . Calcium Carbonate-Vitamin D (CALCIUM 500/VITAMIN D PO) Take 500 mg by mouth 2 (two) times daily.  . citalopram (CELEXA) 10 MG tablet Take 10 mg by mouth daily.   Marland Kitchen denosumab (PROLIA) 60 MG/ML SOLN injection Inject 60 mg into the skin  every 6 (six) months. Administer in upper arm, thigh, or abdomen  . ipratropium (ATROVENT) 0.02 % nebulizer solution Take 0.5 mg by nebulization every 4 (four) hours as needed for wheezing or shortness of breath.  . loratadine (CLARITIN) 10 MG tablet Take 10 mg by mouth daily.  . memantine (NAMENDA) 10 MG tablet Take 10 mg by mouth 2 (two) times daily.  . Menthol, Topical Analgesic, (BIOFREEZE) 4 % GEL Apply 1 application topically daily as needed (for knee pain management). Apply prn to knees for pain management as needed daily   . mirtazapine (REMERON) 15 MG tablet Take 15 mg by mouth at bedtime.  . NON FORMULARY Diet Type:  Continue patient with Dysphagia 2 with honey thickened liquids.  Add ground meat to lunch and dinner trays - no sausage, no gravy on meat - gravy in bowls.  Continue HTL  . NON FORMULARY Magic Cup three times daily between meals for unplanned weight loss  . nystatin cream (MYCOSTATIN) Apply topically to  irritation of buttocks every shift as needed until cleared  . omeprazole (PRILOSEC) 40 MG capsule Take 40 mg by mouth 2 (two) times daily.   . ondansetron (ZOFRAN) 4 MG tablet Take 4 mg by mouth 3 (three) times daily before meals.    No facility-administered encounter medications on file as of 06/19/2019.      SIGNIFICANT DIAGNOSTIC EXAMS   LABS REVIEWED PREVIOUS   11-19-18; wbc 5.2; hgb 12.0; hct 38.4; mcv 100.5 plt 255; glucose 89; bun 13; creat 0.76; k+ 3.6; na++ 142; ca 8.3 hgb a1c 5.5 02-05-19: urine micro-albumin  126.8 03-25-19: wbc 7.7; hgb 14.2; hct 44.7; mcv 100.9; plt 263; glucose 118; bun 13; creat 0.86; k+ 4.7; an++ 142; ca 9.4; vit B 12: 361   TODAY  06-12-19: wbc 8.1; hgb 13.4; hct 42.6; mcv 100.0 plt 278   Review of Systems  Unable to perform ROS: Dementia (unable to participate )     Physical Exam Constitutional:      General: She is not in acute distress.    Appearance: She is well-developed. She is not diaphoretic.     Comments: thin  Neck:     Musculoskeletal: Neck supple.     Thyroid: No thyromegaly.  Cardiovascular:     Rate and Rhythm: Normal rate and regular rhythm.     Pulses: Normal pulses.     Heart sounds: Normal heart sounds.  Pulmonary:     Effort: Pulmonary effort is normal. No respiratory distress.     Breath sounds: Normal breath sounds.  Abdominal:     General: Bowel sounds are normal. There is no distension.     Palpations: Abdomen is soft.     Tenderness: There is no abdominal tenderness.  Musculoskeletal: Normal range of motion.     Right lower leg: No edema.     Left lower leg: No edema.  Lymphadenopathy:     Cervical: No cervical adenopathy.  Skin:    General: Skin is warm and dry.  Neurological:     Mental Status: She is alert. Mental status is at baseline.  Psychiatric:        Mood and Affect: Mood normal.       ASSESSMENT/ PLAN:  TODAY  1. Major depression recurrent chronic 2. Dementia without behavioral disturbance  unspecified dementia type   She is without change Will continue celexa 10 mg daily  Will lower her remeron to 7.5 mg nightly  Will monitor her status.  MD is aware of resident's narcotic use and is in agreement with current plan of care. We will attempt to wean resident as appropriate.  Ayvin Lipinski NP [facility]  Contact [phone] Monday through Friday 8am- 5pm  After hours call [phone]   

## 2019-06-27 ENCOUNTER — Non-Acute Institutional Stay (SKILLED_NURSING_FACILITY): Payer: Medicare Other | Admitting: Adult Health

## 2019-06-27 ENCOUNTER — Encounter: Payer: Self-pay | Admitting: Adult Health

## 2019-06-27 DIAGNOSIS — J3089 Other allergic rhinitis: Secondary | ICD-10-CM | POA: Diagnosis not present

## 2019-06-27 DIAGNOSIS — I6930 Unspecified sequelae of cerebral infarction: Secondary | ICD-10-CM

## 2019-06-27 DIAGNOSIS — R1312 Dysphagia, oropharyngeal phase: Secondary | ICD-10-CM | POA: Diagnosis not present

## 2019-06-27 NOTE — Progress Notes (Signed)
Location:    [facility] Room Number: 115/W Place of Service:  SNF (31)   CODE STATUS: DNR  No Known Allergies  Chief Complaint  Patient presents with  . Medical Management of Chronic Issues       Dysphagia oropharyngeal phase:  Chronic CVA Chronic non-seasonal allergic rhinitis:    HPI:  She is a 85 year old long term resident of this facility being seen for the management of her chronic illnesses: dysphagia; cva; allergies. There are no reports of cough shortness of breath or signs of aspiration. There are no reports of uncontrolled pain.   Past Medical History:  Diagnosis Date  . Asthma   . Dementia ([facility])   . Diabetes mellitus   . H. pylori infection MAR 2014  . Hypertension   . Stroke [facility](HCC)     Past Surgical History:  Procedure Laterality Date  . CHOLECYSTECTOMY    . COLECTOMY     secondary to diverticulitis  . ESOPHAGOGASTRODUODENOSCOPY (EGD) WITH ESOPHAGEAL DILATION N/A 01/07/2013   ZOX:[id number]:[place] ring was found at the gastroesophageal junction/SINGLE DUODENAL DIVERTICULUM    Social History   Socioeconomic History  . Marital status: Married    Spouse name: Not on file  . Number of children: Not on file  . Years of education: Not on file  . Highest education level: Not on file  Occupational History  . Not on file  Social Needs  . Financial resource strain: Not hard at all  . Food insecurity    Worry: Never true    Inability: Never true  . Transportation needs    Medical: No    Non-medical: No  Tobacco Use  . Smoking status: Never Smoker  . Smokeless tobacco: Never Used  Substance and Sexual Activity  . Alcohol use: No  . Drug use: No  . Sexual activity: Not Currently  Lifestyle  . Physical activity    Days per week: 0 days    Minutes per session: 0 min  . Stress: Not at all  Relationships  . Social Musicianconnections    Talks on phone: Never    Gets together: Never    Attends religious service: Never    Active member of club  or organization: No    Attends meetings of clubs or organizations: Never    Relationship status: Widowed  . Intimate partner violence    Fear of current or ex partner: No    Emotionally abused: No    Physically abused: No    Forced sexual activity: No  Other Topics Concern  . Not on file  Social History Narrative   [facility]    Family History  Problem Relation Age of Onset  . Colon cancer Neg Hx       VITAL SIGNS BP 126/81   Pulse 71   Temp 98.4 F (36.9 C) (Oral)   Resp 20   Ht 5\' 5"  (1.651 m)   Wt 125 lb 3.2 oz (56.8 kg)   SpO2 95%   BMI 20.83 kg/m   Outpatient Encounter Medications as of 06/27/2019  Medication Sig  . Balsam [place]-Castor Oil (VENELEX) OINT Apply to sacrum and bilateral buttocks q shift & prn every shift  . Calcium Carbonate-Vitamin D (CALCIUM 500/VITAMIN D PO) Take 500 mg by mouth 2 (two) times daily.  . citalopram (CELEXA) 10 MG tablet Take 10 mg by mouth daily.   Marland Kitchen. denosumab (PROLIA) 60 MG/ML SOLN injection Inject 60 mg  into the skin every 6 (six) months. Administer in upper arm, thigh, or abdomen  . ipratropium (ATROVENT) 0.02 % nebulizer solution Take 0.5 mg by nebulization every 4 (four) hours as needed for wheezing or shortness of breath.  . loratadine (CLARITIN) 10 MG tablet Take 10 mg by mouth daily.  . memantine (NAMENDA) 10 MG tablet Take 10 mg by mouth 2 (two) times daily.  . Menthol, Topical Analgesic, (BIOFREEZE) 4 % GEL Apply 1 application topically daily as needed (for knee pain management). Apply prn to knees for pain management as needed daily   . mirtazapine (REMERON) 7.5 MG tablet Take 7.5 mg by mouth at bedtime.  . NON FORMULARY Diet Type:  Continue patient with Dysphagia 2 with honey thickened liquids.  Add ground meat to lunch and dinner trays - no sausage, no gravy on meat - gravy in bowls.  Continue HTL  . NON FORMULARY Magic Cup three times daily between meals for unplanned weight loss  . nystatin cream  (MYCOSTATIN) Apply topically to irritation of buttocks every shift as needed until cleared  . omeprazole (PRILOSEC) 40 MG capsule Take 40 mg by mouth 2 (two) times daily.   . ondansetron (ZOFRAN) 4 MG tablet Take 4 mg by mouth 3 (three) times daily before meals.   . [DISCONTINUED] mirtazapine (REMERON) 15 MG tablet Take 15 mg by mouth at bedtime.   No facility-administered encounter medications on file as of 06/27/2019.      SIGNIFICANT DIAGNOSTIC EXAMS    LABS REVIEWED PREVIOUS   11-19-18; wbc 5.2; hgb 12.0; hct 38.4; mcv 100.5 plt 255; glucose 89; bun 13; creat 0.76; k+ 3.6; na++ 142; ca 8.3 hgb a1c 5.5 02-05-19: urine micro-albumin  126.8 03-25-19: wbc 7.7; hgb 14.2; hct 44.7; mcv 100.9; plt 263; glucose 118; bun 13; creat 0.86; k+ 4.7; an++ 142; ca 9.4; vit B 12: 361   NO NEW LABS.    Review of Systems  Unable to perform ROS: Dementia (unable to participate )    Physical Exam Constitutional:      General: She is not in acute distress.    Appearance: She is well-developed. She is not diaphoretic.     Comments: thin  Neck:     Musculoskeletal: Neck supple.     Thyroid: No thyromegaly.  Cardiovascular:     Rate and Rhythm: Normal rate and regular rhythm.     Pulses: Normal pulses.     Heart sounds: Normal heart sounds.  Pulmonary:     Effort: Pulmonary effort is normal. No respiratory distress.     Breath sounds: Normal breath sounds.  Abdominal:     General: Bowel sounds are normal. There is no distension.     Palpations: Abdomen is soft.     Tenderness: There is no abdominal tenderness.  Musculoskeletal: Normal range of motion.     Right lower leg: No edema.     Left lower leg: No edema.  Lymphadenopathy:     Cervical: No cervical adenopathy.  Skin:    General: Skin is warm and dry.  Neurological:     Mental Status: She is alert. Mental status is at baseline.  Psychiatric:        Mood and Affect: Mood normal.     ASSESSMENT/ PLAN:   TODAY  1. Dysphagia  oropharyngeal phase: no signs of aspiration; will continue honey thick liquids  2. Chronic CVA is neurologically stable will continue to monitor her status   3. Chronic non-seasonal allergic rhinitis: is stable  will continue claritin 10 mg daily has atrovent neb every 4 hours as needed.   PREVIOUS   4. GERD without esophagitis: is stable will continue prilosec 40 mg twice daily and zofran 4 mg prior to meals.   5. Major depression recurrent chronic: is emotionally stable will continue remeron 7.5 mg nightly and celexa 10 mg daily has tolerated the dose reduction.   6. Age related osteoporosis without current pathological fracture: is stable will continue prolia 60 mg every 6 months and supplements  7. Unspecified type dementia without behavioral disturbance: is without change: weight is stable at 125 (previous 123,) pounds; will continue namenda 10 mg twice daily   8. Essential hypertension: is stable b/p 126/81 will continue to monitor her status.      MD is aware of resident's narcotic use and is in agreement with current plan of care. We will attempt to wean resident as appropriate.  Synthia Innocent NP [facility]  Contact [phone] Monday through Friday 8am- 5pm  After hours call [phone]

## 2019-07-01 ENCOUNTER — Non-Acute Institutional Stay (SKILLED_NURSING_FACILITY): Payer: Medicare Other | Admitting: Adult Health

## 2019-07-01 ENCOUNTER — Encounter: Payer: Self-pay | Admitting: Adult Health

## 2019-07-01 DIAGNOSIS — F339 Major depressive disorder, recurrent, unspecified: Secondary | ICD-10-CM

## 2019-07-01 NOTE — Progress Notes (Signed)
[facility] Room Number: 0277 Place of Service:  SNF (31)   CODE STATUS: dnr  No Known Allergies  Chief Complaint  Patient presents with  . Acute Visit    depression     HPI:  She has had both her celexa and remeron lowered for gradual dose reduction. Staff reports that over this past weekend she has had more tearful moments. There are no reports of agitation present. No reports of insomnia. She is not tolerating having her lower dose of celexa.   Past Medical History:  Diagnosis Date  . Asthma   . Dementia ([place])   . Diabetes mellitus   . H. pylori infection MAR 2014  . Hypertension   . Stroke [facility])     Past Surgical History:  Procedure Laterality Date  . CHOLECYSTECTOMY    . COLECTOMY     secondary to diverticulitis  . ESOPHAGOGASTRODUODENOSCOPY (EGD) WITH ESOPHAGEAL DILATION N/A 01/07/2013   AJO:INOMVEHM ring was found at the gastroesophageal junction/SINGLE DUODENAL DIVERTICULUM    Social History   Socioeconomic History  . Marital status: Married    Spouse name: Not on file  . Number of children: Not on file  . Years of education: Not on file  . Highest education level: Not on file  Occupational History  . Not on file  Social Needs  . Financial resource strain: Not hard at all  . Food insecurity    Worry: Never true    Inability: Never true  . Transportation needs    Medical: No    Non-medical: No  Tobacco Use  . Smoking status: Never Smoker  . Smokeless tobacco: Never Used  Substance and Sexual Activity  . Alcohol use: No  . Drug use: No  . Sexual activity: Not Currently  Lifestyle  . Physical activity    Days per week: 0 days    Minutes per session: 0 min  . Stress: Not at all  Relationships  . Social Herbalist on phone: Never    Gets together: Never    Attends religious service: Never    Active member of club or organization: No    Attends meetings of clubs or organizations: Never    Relationship  status: Widowed  . Intimate partner violence    Fear of current or ex partner: No    Emotionally abused: No    Physically abused: No    Forced sexual activity: No  Other Topics Concern  . Not on file  Social History Narrative   Is a long term resident of [facility]    Family History  Problem Relation Age of Onset  . Colon cancer Neg Hx       VITAL SIGNS BP 129/72   Pulse 70   Temp 98.4 F (36.9 C)   Resp 18   Ht 5\' 5"  (1.651 m)   Wt 125 lb 3.2 oz (56.8 kg)   BMI 20.83 kg/m   Outpatient Encounter Medications as of 07/01/2019  Medication Sig  . Balsam [place]-Castor Oil (VENELEX) OINT Apply to sacrum and bilateral buttocks q shift & prn every shift  . Calcium Carbonate-Vitamin D (CALCIUM 500/VITAMIN D PO) Take 500 mg by mouth 2 (two) times daily.  . citalopram (CELEXA) 10 MG tablet Take 10 mg by mouth daily.   Marland Kitchen denosumab (PROLIA) 60 MG/ML SOLN injection Inject 60 mg into the skin every 6 (six) months. Administer in upper arm, thigh, or abdomen  .  ipratropium (ATROVENT) 0.02 % nebulizer solution Take 0.5 mg by nebulization every 4 (four) hours as needed for wheezing or shortness of breath.  . loratadine (CLARITIN) 10 MG tablet Take 10 mg by mouth daily.  . memantine (NAMENDA) 10 MG tablet Take 10 mg by mouth 2 (two) times daily.  . Menthol, Topical Analgesic, (BIOFREEZE) 4 % GEL Apply 1 application topically daily as needed (for knee pain management). Apply prn to knees for pain management as needed daily   . mirtazapine (REMERON) 7.5 MG tablet Take 7.5 mg by mouth at bedtime.  . NON FORMULARY Diet Type:  Continue patient with Dysphagia 2 with honey thickened liquids.  Add ground meat to lunch and dinner trays - no sausage, no gravy on meat - gravy in bowls.  Continue HTL  . NON FORMULARY Magic Cup three times daily between meals for unplanned weight loss  . nystatin cream (MYCOSTATIN) Apply topically to irritation of buttocks every shift as needed until cleared  . omeprazole (PRILOSEC)  40 MG capsule Take 40 mg by mouth 2 (two) times daily.   . ondansetron (ZOFRAN) 4 MG tablet Take 4 mg by mouth 3 (three) times daily before meals.    No facility-administered encounter medications on file as of 07/01/2019.      SIGNIFICANT DIAGNOSTIC EXAMS   LABS REVIEWED PREVIOUS   11-19-18; wbc 5.2; hgb 12.0; hct 38.4; mcv 100.5 plt 255; glucose 89; bun 13; creat 0.76; k+ 3.6; na++ 142; ca 8.3 hgb a1c 5.5 02-05-19: urine micro-albumin  126.8 03-25-19: wbc 7.7; hgb 14.2; hct 44.7; mcv 100.9; plt 263; glucose 118; bun 13; creat 0.86; k+ 4.7; an++ 142; ca 9.4; vit B 12: 361   NO NEW LABS.    Review of Systems  Unable to perform ROS: Dementia (unable to participate )   Physical Exam Constitutional:      General: She is not in acute distress.    Appearance: She is well-developed. She is not diaphoretic.     Comments: thin  Neck:     Musculoskeletal: Neck supple.     Thyroid: No thyromegaly.  Cardiovascular:     Rate and Rhythm: Normal rate and regular rhythm.     Pulses: Normal pulses.     Heart sounds: Normal heart sounds.  Pulmonary:     Effort: Pulmonary effort is normal. No respiratory distress.     Breath sounds: Normal breath sounds.  Abdominal:     General: Bowel sounds are normal. There is no distension.     Palpations: Abdomen is soft.     Tenderness: There is no abdominal tenderness.  Musculoskeletal: Normal range of motion.     Right lower leg: No edema.     Left lower leg: No edema.  Lymphadenopathy:     Cervical: No cervical adenopathy.  Skin:    General: Skin is warm and dry.  Neurological:     Mental Status: She is alert. Mental status is at baseline.  Psychiatric:     Comments: Has a sad affect       ASSESSMENT/ PLAN:  TODAY;   1. Major depression recurrent chronic: is worse; will return her celexa to 20 mg daily and will monitor her status     MD is aware of resident's narcotic use and is in agreement with current plan of care. We will attempt  to wean resident as appropriate.  Synthia Innocenteborah  NP [facility]  Contact [phone] Monday through Friday 8am- 5pm  After hours call [phone]

## 2019-07-23 ENCOUNTER — Encounter: Payer: Self-pay | Admitting: Internal Medicine

## 2019-07-23 ENCOUNTER — Non-Acute Institutional Stay (SKILLED_NURSING_FACILITY): Payer: Medicare Other | Admitting: Internal Medicine

## 2019-07-23 DIAGNOSIS — K219 Gastro-esophageal reflux disease without esophagitis: Secondary | ICD-10-CM

## 2019-07-23 DIAGNOSIS — F0390 Unspecified dementia without behavioral disturbance: Secondary | ICD-10-CM | POA: Diagnosis not present

## 2019-07-23 DIAGNOSIS — I10 Essential (primary) hypertension: Secondary | ICD-10-CM | POA: Diagnosis not present

## 2019-07-23 NOTE — Progress Notes (Signed)
Location:  [facility] Room Number: 06/12/16 Place of Service:  SNF (31)  Hennie Duos, MD  Patient Care Team: Hennie Duos, MD as PCP - General (Internal Medicine) [facility], [place] Nyoka Cowden, Phylis Bougie, NP as Nurse Practitioner (Geriatric Medicine)  Extended Emergency Contact Information Primary Emergency Contact: [place] Phone: [phone] Relation: Son Secondary Emergency Contact: Alfonse Flav[place] Phone: [phone] Mobile Phone: [phone] Relation: Son    Allergies: Patient has no known allergies.  Chief Complaint  Patient presents with  . Medical Management of Chronic Issues    Routine [facility] visit    HPI: Patient is an 85 y.o. female who is being seen for routine issues of hypertension, GERD, and dementia.  Past Medical History:  Diagnosis Date  . Asthma   . Dementia ([place])   . Diabetes mellitus   . H. pylori infection MAR 2014  . Hypertension   . Stroke [facility])     Past Surgical History:  Procedure Laterality Date  . CHOLECYSTECTOMY    . COLECTOMY     secondary to diverticulitis  . ESOPHAGOGASTRODUODENOSCOPY (EGD) WITH ESOPHAGEAL DILATION N/A 01/07/2013   [id number] ring was found at the gastroesophageal junction/SINGLE DUODENAL DIVERTICULUM    Allergies as of 07/23/2019   No Known Allergies     Medication List    Notice   This visit is during an admission. Changes to the med list made in this visit will be reflected in the After Visit Summary of the admission.     No orders of the defined types were placed in this encounter.   Immunization History  Administered Date(s) Administered  . Influenza, High Dose Seasonal PF 07/08/2018  . Influenza-Unspecified 07/20/2017, 07/18/2018, 07/21/2019  . Pneumococcal Conjugate-13 06/12/2017  . Pneumococcal-Unspecified 07/24/2016  . Tdap 07/12/2017    Social  History   Tobacco Use  . Smoking status: Never Smoker  . Smokeless tobacco: Never Used  Substance Use Topics  . Alcohol use: No    Review of Systems    Vitals:   07/23/19 1234  BP: 116/65  Pulse: 70  Resp: (!) 24  Temp: 98.4 F (36.9 C)  SpO2: 95%   Body mass index is 21.23 kg/m. Physical Exam  GENERAL APPEARANCE: Alert, No acute distress  SKIN: No diaphoresis rash HEENT: Unremarkable RESPIRATORY: Breathing is even, unlabored. Lung sounds are clear   CARDIOVASCULAR: Heart RRR no murmurs, rubs or gallops. No peripheral edema  GASTROINTESTINAL: Abdomen is soft, non-tender, not distended w/ normal bowel sounds.  GENITOURINARY: Bladder non tender, not distended  MUSCULOSKELETAL: No abnormal joints or musculature NEUROLOGIC: Cranial nerves 2-12 grossly intact. Moves all extremities PSYCHIATRIC: Dementia, no behavioral issues  Patient Active Problem List   Diagnosis Date Noted  . Chronic cerebrovascular accident (CVA) 04/26/2019  . Dysphagia, oropharyngeal phase 12/19/2018  . Chronic non-seasonal allergic rhinitis 12/19/2018  . GERD without esophagitis 12/19/2018  . Major depression, recurrent, chronic ([place]) 12/19/2018  . Anemia 05/21/2018  . Osteoporosis 11/21/2016  . Hypertension 09/26/2016  . Esophageal dysmotility 07/25/2016  . Dementia ([place]) 07/06/2016    CMP     Component Value Date/Time   NA 142 03/25/2019 0724   K 4.7 03/25/2019 0724   CL 102 03/25/2019 0724   CO2 29 03/25/2019 0724   GLUCOSE 118 (H) 03/25/2019 0724   BUN 13 03/25/2019 0724   CREATININE 0.86 03/25/2019 0724   CALCIUM 9.4 03/25/2019 0724  PROT 6.5 06/30/2018 1855   ALBUMIN 3.2 (L) 06/30/2018 1855   AST 23 06/30/2018 1855   ALT 11 06/30/2018 1855   ALKPHOS 53 06/30/2018 1855   BILITOT 0.6 06/30/2018 1855   GFRNONAA >60 03/25/2019 0724   GFRAA >60 03/25/2019 0724   Recent Labs    08/29/18 0700 11/19/18 0700 03/25/19 0724  NA 140 142 142  K 3.7 3.6 4.7  CL 106 109 102  CO2  27 26 29   GLUCOSE 79 89 118*  BUN 10 13 13   CREATININE 0.71 0.76 0.86  CALCIUM 8.0* 8.3* 9.4   No results for input(s): AST, ALT, ALKPHOS, BILITOT, PROT, ALBUMIN in the last 8760 hours. Recent Labs    08/29/18 0700 11/19/18 0700 03/25/19 0724 06/12/19 1100  WBC 6.0 5.2 7.7 8.1  NEUTROABS 3.4 2.7  --  5.1  HGB 12.3 12.0 14.2 13.4  HCT 39.1 38.4 44.7 42.6  MCV 96.3 100.5* 100.9* 100.0  PLT 210 255 263 278   No results for input(s): CHOL, LDLCALC, TRIG in the last 8760 hours.  Invalid input(s): HCL Lab Results  Component Value Date   MICROALBUR 126.8 (H) 02/05/2019   Lab Results  Component Value Date   TSH 1.030 09/11/2017   Lab Results  Component Value Date   HGBA1C 5.5 11/19/2018   No results found for: CHOL, HDL, LDLCALC, LDLDIRECT, TRIG, CHOLHDL  Significant Diagnostic Results in last 30 days:  No results found.  Assessment and Plan  No problem-specific Assessment & Plan notes found for this encounter.   Labs/tests ordered:    09/13/2017, MD

## 2019-07-27 ENCOUNTER — Encounter: Payer: Self-pay | Admitting: Internal Medicine

## 2019-07-27 NOTE — Assessment & Plan Note (Signed)
Chronic and stable; continue Namenda 10 mg twice daily 

## 2019-07-27 NOTE — Assessment & Plan Note (Signed)
No reports of reflux or aspiration, but patient does have nausea; continue omeprazole 40 mg twice daily and Zofran 4 mg with meals

## 2019-07-27 NOTE — Assessment & Plan Note (Signed)
Stable on no meds; continue to monitor vital signs at intervals

## 2019-07-28 ENCOUNTER — Other Ambulatory Visit: Payer: Self-pay | Admitting: Adult Health

## 2019-08-06 ENCOUNTER — Other Ambulatory Visit: Payer: Self-pay | Admitting: Adult Health

## 2019-08-11 ENCOUNTER — Encounter (HOSPITAL_COMMUNITY)
Admission: RE | Admit: 2019-08-11 | Discharge: 2019-08-11 | Disposition: A | Discharge status: Home / Self Care | Payer: Medicare Other | Source: Skilled Nursing Facility | Attending: Internal Medicine | Admitting: Internal Medicine

## 2019-08-11 DIAGNOSIS — K219 Gastro-esophageal reflux disease without esophagitis: Secondary | ICD-10-CM | POA: Diagnosis present

## 2019-08-11 DIAGNOSIS — F0390 Unspecified dementia without behavioral disturbance: Secondary | ICD-10-CM | POA: Insufficient documentation

## 2019-08-11 DIAGNOSIS — B379 Candidiasis, unspecified: Secondary | ICD-10-CM | POA: Insufficient documentation

## 2019-08-11 DIAGNOSIS — D649 Anemia, unspecified: Secondary | ICD-10-CM | POA: Insufficient documentation

## 2019-08-11 DIAGNOSIS — Z5189 Encounter for other specified aftercare: Secondary | ICD-10-CM | POA: Diagnosis present

## 2019-08-11 DIAGNOSIS — E119 Type 2 diabetes mellitus without complications: Secondary | ICD-10-CM | POA: Diagnosis not present

## 2019-08-11 LAB — HEMOGLOBIN A1C
Hgb A1c MFr Bld: 6.1 % — ABNORMAL HIGH (ref 4.8–5.6)
Mean Plasma Glucose: 128.37 mg/dL

## 2019-08-18 ENCOUNTER — Non-Acute Institutional Stay (SKILLED_NURSING_FACILITY): Payer: Medicare Other | Admitting: Adult Health

## 2019-08-18 ENCOUNTER — Encounter: Payer: Self-pay | Admitting: Adult Health

## 2019-08-18 DIAGNOSIS — F339 Major depressive disorder, recurrent, unspecified: Secondary | ICD-10-CM | POA: Diagnosis not present

## 2019-08-18 DIAGNOSIS — M810 Age-related osteoporosis without current pathological fracture: Secondary | ICD-10-CM | POA: Diagnosis not present

## 2019-08-18 NOTE — Progress Notes (Signed)
Location:    [facility] Room Number: 115/W Place of Service:  SNF (31)   CODE STATUS: DNR  No Known Allergies  Chief Complaint  Patient presents with  . Acute Visit    Falls    HPI:  She has had 2 falls over the past weekend without injuries. She is taking remeron 7.5 mg nightly for her depression. She has gained 9 pounds since June 2020.  There are no reports of her crying or reports of anxiety. There are no reports of agitation present.   Past Medical History:  Diagnosis Date  . Asthma   . Dementia (HCC)   . Diabetes mellitus   . H. pylori infection MAR 2014  . Hypertension   . Stroke [facility])     Past Surgical History:  Procedure Laterality Date  . CHOLECYSTECTOMY    . COLECTOMY     secondary to diverticulitis  . ESOPHAGOGASTRODUODENOSCOPY (EGD) WITH ESOPHAGEAL DILATION N/A 01/07/2013   [id number] ring was found at the gastroesophageal junction/SINGLE DUODENAL DIVERTICULUM    Social History   Socioeconomic History  . Marital status: Married    Spouse name: Not on file  . Number of children: Not on file  . Years of education: Not on file  . Highest education level: Not on file  Occupational History  . Not on file  Social Needs  . Financial resource strain: Not hard at all  . Food insecurity    Worry: Never true    Inability: Never true  . Transportation needs    Medical: No    Non-medical: No  Tobacco Use  . Smoking status: Never Smoker  . Smokeless tobacco: Never Used  Substance and Sexual Activity  . Alcohol use: No  . Drug use: No  . Sexual activity: Not Currently  Lifestyle  . Physical activity    Days per week: 0 days    Minutes per session: 0 min  . Stress: Not at all  Relationships  . Social Musician on phone: Never    Gets together: Never    Attends religious service: Never    Active member of club or organization: No    Attends meetings of clubs or organizations: Never    Relationship status:  Widowed  . Intimate partner violence    Fear of current or ex partner: No    Emotionally abused: No    Physically abused: No    Forced sexual activity: No  Other Topics Concern  . Not on file  Social History Narrative   Is a long term resident of [facility]    Family History  Problem Relation Age of Onset  . Colon cancer Neg Hx       VITAL SIGNS BP 119/68   Pulse (!) 50   Temp 98.6 F (37 C) (Oral)   Resp 18   Ht 5\' 5"  (1.651 m)   Wt 127 lb 6.4 oz (57.8 kg)   SpO2 95%   BMI 21.20 kg/m   Outpatient Encounter Medications as of 08/18/2019  Medication Sig  . Balsam [place]-Castor Oil (VENELEX) OINT Apply to sacrum and bilateral buttocks q shift & prn every shift  . Calcium Carbonate-Vitamin D (CALCIUM 500/VITAMIN D PO) Take 500 mg by mouth 2 (two) times daily.  . citalopram (CELEXA) 20 MG tablet Take 20 mg by mouth daily.  13/11/2018 denosumab (PROLIA) 60 MG/ML SOLN injection Inject 60 mg into the skin every 6 (six) months. Administer in upper arm,  thigh, or abdomen  . ipratropium (ATROVENT) 0.02 % nebulizer solution Take 0.5 mg by nebulization every 4 (four) hours as needed for wheezing or shortness of breath.  . loratadine (CLARITIN) 10 MG tablet Take 10 mg by mouth daily.  . memantine (NAMENDA) 10 MG tablet Take 10 mg by mouth 2 (two) times daily.  . Menthol, Topical Analgesic, (BIOFREEZE) 4 % GEL Apply 1 application topically daily as needed (for knee pain management). Apply prn to knees for pain management as needed daily   . mirtazapine (REMERON) 7.5 MG tablet Take 7.5 mg by mouth at bedtime.  . NON FORMULARY Diet Type:  Continue patient with Dysphagia 2 with honey thickened liquids.  Add ground meat to lunch and dinner trays - no sausage, no gravy on meat - gravy in bowls.  Continue HTL  . NON FORMULARY Magic Cup three times daily between meals for unplanned weight loss  . omeprazole (PRILOSEC) 40 MG capsule Take 40 mg by mouth 2 (two) times daily.   . ondansetron (ZOFRAN) 4 MG tablet Take  4 mg by mouth 3 (three) times daily before meals.    No facility-administered encounter medications on file as of 08/18/2019.      SIGNIFICANT DIAGNOSTIC EXAMS   LABS REVIEWED PREVIOUS   11-19-18; wbc 5.2; hgb 12.0; hct 38.4; mcv 100.5 plt 255; glucose 89; bun 13; creat 0.76; k+ 3.6; na++ 142; ca 8.3 hgb a1c 5.5 02-05-19: urine micro-albumin  126.8 03-25-19: wbc 7.7; hgb 14.2; hct 44.7; mcv 100.9; plt 263; glucose 118; bun 13; creat 0.86; k+ 4.7; an++ 142; ca 9.4; vit B 12: 361   TODAY;   06-12-19: wbc 8.1; hgg 13.4; hct 42.6; mcv 100;0 plt 278; 08-11-19: hgb a1c 6.1   Review of Systems  Unable to perform ROS: Dementia (unable to participate )    Physical Exam Constitutional:      General: She is not in acute distress.    Appearance: She is well-developed. She is not diaphoretic.  Neck:     Musculoskeletal: Neck supple.     Thyroid: No thyromegaly.  Cardiovascular:     Rate and Rhythm: Normal rate and regular rhythm.     Pulses: Normal pulses.     Heart sounds: Normal heart sounds.  Pulmonary:     Effort: Pulmonary effort is normal. No respiratory distress.     Breath sounds: Normal breath sounds.  Abdominal:     General: Bowel sounds are normal. There is no distension.     Palpations: Abdomen is soft.     Tenderness: There is no abdominal tenderness.  Musculoskeletal: Normal range of motion.     Right lower leg: No edema.     Left lower leg: No edema.  Lymphadenopathy:     Cervical: No cervical adenopathy.  Skin:    General: Skin is warm and dry.  Neurological:     Mental Status: She is alert. Mental status is at baseline.  Psychiatric:        Mood and Affect: Mood normal.      ASSESSMENT/ PLAN:  TODAY;   1. Age related osteoporosis without current pathological fracture 2. Major depression chronic recurrent  Will setup DEXA scan Will wean off remeron to every other night and will stop on 08-25-19 due to her falls Will monitor her status.      MD is  aware of resident's narcotic use and is in agreement with current plan of care. We will attempt to wean resident as appropriate.  Neoma Laming   NP [facility]  Contact [phone] Monday through Friday 8am- 5pm  After hours call [phone]

## 2019-08-22 ENCOUNTER — Non-Acute Institutional Stay (SKILLED_NURSING_FACILITY): Payer: Medicare Other | Admitting: Adult Health

## 2019-08-22 ENCOUNTER — Encounter: Payer: Self-pay | Admitting: Adult Health

## 2019-08-22 DIAGNOSIS — K219 Gastro-esophageal reflux disease without esophagitis: Secondary | ICD-10-CM | POA: Diagnosis not present

## 2019-08-22 DIAGNOSIS — F339 Major depressive disorder, recurrent, unspecified: Secondary | ICD-10-CM | POA: Diagnosis not present

## 2019-08-22 DIAGNOSIS — M810 Age-related osteoporosis without current pathological fracture: Secondary | ICD-10-CM | POA: Diagnosis not present

## 2019-08-22 NOTE — Progress Notes (Signed)
Location:  [facility] Room Number: 115-W Place of Service:  SNF (31)   CODE STATUS:  DNR  No Known Allergies  Chief Complaint  Patient presents with  . Medical Management of Chronic Issues        GERD without esophagitis:  Major depression recurrent chronic: Age related osteoporosis without current pathological fracture    HPI:  She is a 85 year old long term resident of this facility being seen for the management of her chronic illnesses; gerd; depression; osteoporosis. There are no reports of nausea; heart burn; no reports of uncontrolled pain.   Past Medical History:  Diagnosis Date  . Asthma   . Dementia (HCC)   . Diabetes mellitus   . H. pylori infection MAR 2014  . Hypertension   . Stroke [facility](HCC)     Past Surgical History:  Procedure Laterality Date  . CHOLECYSTECTOMY    . COLECTOMY     secondary to diverticulitis  . ESOPHAGOGASTRODUODENOSCOPY (EGD) WITH ESOPHAGEAL DILATION N/A 01/07/2013   [id number]:Schatzki ring was found at the gastroesophageal junction/SINGLE DUODENAL DIVERTICULUM    Social History   Socioeconomic History  . Marital status: Married    Spouse name: Not on file  . Number of children: Not on file  . Years of education: Not on file  . Highest education level: Not on file  Occupational History  . Not on file  Social Needs  . Financial resource strain: Not hard at all  . Food insecurity    Worry: Never true    Inability: Never true  . Transportation needs    Medical: No    Non-medical: No  Tobacco Use  . Smoking status: Never Smoker  . Smokeless tobacco: Never Used  Substance and Sexual Activity  . Alcohol use: No  . Drug use: No  . Sexual activity: Not Currently  Lifestyle  . Physical activity    Days per week: 0 days    Minutes per session: 0 min  . Stress: Not at all  Relationships  . Social Musicianconnections    Talks on phone: Never    Gets together: Never    Attends religious service: Never    Active member of  club or organization: No    Attends meetings of clubs or organizations: Never    Relationship status: Widowed  . Intimate partner violence    Fear of current or ex partner: No    Emotionally abused: No    Physically abused: No    Forced sexual activity: No  Other Topics Concern  . Not on file  Social History Narrative   [facility]    Family History  Problem Relation Age of Onset  . Colon cancer Neg Hx       VITAL SIGNS BP 126/64   Pulse 78   Temp 98.6 F (37 C) (Oral)   Resp 18   Ht 5\' 5"  (1.651 m)   Wt 127 lb 6.4 oz (57.8 kg)   SpO2 95%   BMI 21.20 kg/m   Outpatient Encounter Medications as of 08/22/2019  Medication Sig  . Balsam [place]-Castor Oil (VENELEX) OINT Apply to sacrum and bilateral buttocks q shift & prn every shift  . Calcium Carbonate-Vitamin D (CALCIUM 500/VITAMIN D PO) Take 500 mg by mouth 2 (two) times daily.  . citalopram (CELEXA) 20 MG tablet Take 20 mg by mouth daily.  Marland Kitchen. denosumab (PROLIA) 60 MG/ML SOLN injection Inject 60 mg into the skin  every 6 (six) months. Administer in upper arm, thigh, or abdomen  . ipratropium (ATROVENT) 0.02 % nebulizer solution Take 0.5 mg by nebulization every 4 (four) hours as needed for wheezing or shortness of breath.  . loratadine (CLARITIN) 10 MG tablet Take 10 mg by mouth daily.  . memantine (NAMENDA) 10 MG tablet Take 10 mg by mouth 2 (two) times daily.  . Menthol, Topical Analgesic, (BIOFREEZE) 4 % GEL Apply 1 application topically daily as needed (for knee pain management). Apply prn to knees for pain management as needed daily   . mirtazapine (REMERON) 7.5 MG tablet Take 7.5 mg by mouth at bedtime.  . NON FORMULARY Diet Type:  Continue patient with Dysphagia 2 with honey thickened liquids.  Add ground meat to lunch and dinner trays - no sausage, no gravy on meat - gravy in bowls.  Continue HTL  . NON FORMULARY Take 1 each by mouth 3 (three) times daily between meals. Magic Cup  . omeprazole  (PRILOSEC) 40 MG capsule Take 40 mg by mouth 2 (two) times daily.   . ondansetron (ZOFRAN) 4 MG tablet Take 4 mg by mouth 3 (three) times daily before meals.    No facility-administered encounter medications on file as of 08/22/2019.      SIGNIFICANT DIAGNOSTIC EXAMS   LABS REVIEWED PREVIOUS   11-19-18; wbc 5.2; hgb 12.0; hct 38.4; mcv 100.5 plt 255; glucose 89; bun 13; creat 0.76; k+ 3.6; na++ 142; ca 8.3 hgb a1c 5.5 02-05-19: urine micro-albumin  126.8 03-25-19: wbc 7.7; hgb 14.2; hct 44.7; mcv 100.9; plt 263; glucose 118; bun 13; creat 0.86; k+ 4.7; an++ 142; ca 9.4; vit B 12: 361  06-12-19: wbc 8.1; hgb 13.4; hct 42.6; mcv 100;0 plt 278; 08-11-19: hgb a1c 6.1   NO NEW LABS    Review of Systems  Unable to perform ROS: Dementia (unable to participate )   Physical Exam Constitutional:      General: She is not in acute distress.    Appearance: She is well-developed. She is not diaphoretic.  Neck:     Musculoskeletal: Neck supple.     Thyroid: No thyromegaly.  Cardiovascular:     Rate and Rhythm: Normal rate and regular rhythm.     Pulses: Normal pulses.     Heart sounds: Normal heart sounds.  Pulmonary:     Effort: Pulmonary effort is normal. No respiratory distress.     Breath sounds: Normal breath sounds.  Abdominal:     General: Bowel sounds are normal. There is no distension.     Palpations: Abdomen is soft.     Tenderness: There is no abdominal tenderness.  Musculoskeletal: Normal range of motion.     Right lower leg: No edema.     Left lower leg: No edema.  Lymphadenopathy:     Cervical: No cervical adenopathy.  Skin:    General: Skin is warm and dry.  Neurological:     Mental Status: She is alert. Mental status is at baseline.  Psychiatric:        Mood and Affect: Mood normal.       ASSESSMENT/ PLAN:  TODAY  1. GERD without esophagitis: is stable will continue prilosec 40 mg twice daily and zofran 4 mg prior to meals.   2. Major depression recurrent  chronic: is stable has failed one wean off celexa; will continue remeron 7.5 mg every other night through 08-25-19. Will continue celexa 20 gm daily   3. Age related osteoporosis without current  pathological fracture: is stable will continue proliz 60 mg every 6 months and supplements   PREVIOUS   4. Unspecified type dementia without behavioral disturbance: is without change: weight is stable at 12 pounds; will continue namenda 10 mg twice daily   5 Essential hypertension: is stable b/p 126/64 will continue to monitor her status.    6. Dysphagia oropharyngeal phase: no signs of aspiration; will continue honey thick liquids  7. Chronic CVA is neurologically stable will continue to monitor her status   8. Chronic non-seasonal allergic rhinitis: is stable will continue claritin 10 mg daily will stop atrovent neb due to nonuse     MD is aware of resident's narcotic use and is in agreement with current plan of care. We will attempt to wean resident as appropriate.  Synthia Innocent NP [facility]  Contact [phone] Monday through Friday 8am- 5pm  After hours call [phone]

## 2019-08-27 ENCOUNTER — Non-Acute Institutional Stay (SKILLED_NURSING_FACILITY): Payer: Medicare Other | Admitting: Adult Health

## 2019-08-27 ENCOUNTER — Encounter: Payer: Self-pay | Admitting: Adult Health

## 2019-08-27 DIAGNOSIS — F0390 Unspecified dementia without behavioral disturbance: Secondary | ICD-10-CM | POA: Diagnosis not present

## 2019-08-27 DIAGNOSIS — F339 Major depressive disorder, recurrent, unspecified: Secondary | ICD-10-CM

## 2019-08-27 DIAGNOSIS — I6930 Unspecified sequelae of cerebral infarction: Secondary | ICD-10-CM

## 2019-08-27 NOTE — Progress Notes (Signed)
Location:  [place] Room Number: 115-W Place of Service:  SNF (31)   CODE STATUS: DNR  No Known Allergies  Chief Complaint  Patient presents with  . Acute Visit    care plan meeting      HPI:  We have come together for her care plan meeting. BIMS 8/15 mood 6/30. She has had 3 falls no significant injury. She has gained four pounds. Her appetite is good. There are no reports of uncontrolled pain. She continues to be followed for her chronic illnesses including: dementia; cva; depression.   Past Medical History:  Diagnosis Date  . Asthma   . Dementia ([place])   . Diabetes mellitus   . H. pylori infection MAR 2014  . Hypertension   . Stroke [facility])     Past Surgical History:  Procedure Laterality Date  . CHOLECYSTECTOMY    . COLECTOMY     secondary to diverticulitis  . ESOPHAGOGASTRODUODENOSCOPY (EGD) WITH ESOPHAGEAL DILATION N/A 01/07/2013   [id number] ring was found at the gastroesophageal junction/SINGLE DUODENAL DIVERTICULUM    Social History   Socioeconomic History  . Marital status: Married    Spouse name: Not on file  . Number of children: Not on file  . Years of education: Not on file  . Highest education level: Not on file  Occupational History  . Not on file  Social Needs  . Financial resource strain: Not hard at all  . Food insecurity    Worry: Never true    Inability: Never true  . Transportation needs    Medical: No    Non-medical: No  Tobacco Use  . Smoking status: Never Smoker  . Smokeless tobacco: Never Used  Substance and Sexual Activity  . Alcohol use: No  . Drug use: No  . Sexual activity: Not Currently  Lifestyle  . Physical activity    Days per week: 0 days    Minutes per session: 0 min  . Stress: Not at all  Relationships  . Social Herbalist on phone: Never    Gets together: Never    Attends religious service: Never    Active member of club or organization: No    Attends meetings of clubs or  organizations: Never    Relationship status: Widowed  . Intimate partner violence    Fear of current or ex partner: No    Emotionally abused: No    Physically abused: No    Forced sexual activity: No  Other Topics Concern  . Not on file  Social History Narrative   Is a long term resident of [facility]    Family History  Problem Relation Age of Onset  . Colon cancer Neg Hx       VITAL SIGNS BP 119/67   Pulse 66   Temp 98.4 F (36.9 C) (Oral)   Resp 18   Ht 5\' 5"  (1.651 m)   Wt 127 lb 6.4 oz (57.8 kg)   SpO2 95%   BMI 21.20 kg/m   Outpatient Encounter Medications as of 08/27/2019  Medication Sig  . Balsam [place]-Castor Oil (VENELEX) OINT Apply to sacrum and bilateral buttocks q shift & prn every shift  . Calcium Carbonate-Vitamin D (CALCIUM 500/VITAMIN D PO) Take 500 mg by mouth 2 (two) times daily.  . citalopram (CELEXA) 20 MG tablet Take 20 mg by mouth daily.  Marland Kitchen denosumab (PROLIA) 60 MG/ML SOLN injection Inject 60 mg into the skin every 6 (six) months. Administer  in upper arm, thigh, or abdomen  . loratadine (CLARITIN) 10 MG tablet Take 10 mg by mouth daily.  . memantine (NAMENDA) 10 MG tablet Take 10 mg by mouth 2 (two) times daily.  . NON FORMULARY Diet Type:  Continue patient with Dysphagia 2 with honey thickened liquids.  Add ground meat to lunch and dinner trays - no sausage, no gravy on meat - gravy in bowls.  Continue HTL  . NON FORMULARY Take 1 each by mouth 3 (three) times daily between meals. Magic Cup  . omeprazole (PRILOSEC) 40 MG capsule Take 40 mg by mouth 2 (two) times daily.   . ondansetron (ZOFRAN) 4 MG tablet Take 4 mg by mouth 3 (three) times daily before meals.   . [DISCONTINUED] ipratropium (ATROVENT) 0.02 % nebulizer solution Take 0.5 mg by nebulization every 4 (four) hours as needed for wheezing or shortness of breath.  . [DISCONTINUED] Menthol, Topical Analgesic, (BIOFREEZE) 4 % GEL Apply 1 application topically daily as needed (for knee pain management).  Apply prn to knees for pain management as needed daily   . [DISCONTINUED] mirtazapine (REMERON) 7.5 MG tablet Take 7.5 mg by mouth at bedtime.   No facility-administered encounter medications on file as of 08/27/2019.      SIGNIFICANT DIAGNOSTIC EXAMS   LABS REVIEWED PREVIOUS   11-19-18; wbc 5.2; hgb 12.0; hct 38.4; mcv 100.5 plt 255; glucose 89; bun 13; creat 0.76; k+ 3.6; na++ 142; ca 8.3 hgb a1c 5.5 02-05-19: urine micro-albumin  126.8 03-25-19: wbc 7.7; hgb 14.2; hct 44.7; mcv 100.9; plt 263; glucose 118; bun 13; creat 0.86; k+ 4.7; an++ 142; ca 9.4; vit B 12: 361  06-12-19: wbc 8.1; hgb 13.4; hct 42.6; mcv 100;0 plt 278; 08-11-19: hgb a1c 6.1   NO NEW LABS   Review of Systems  Unable to perform ROS: Dementia (is unable to participate )    Physical Exam Constitutional:      General: She is not in acute distress.    Appearance: She is well-developed. She is not diaphoretic.  Neck:     Musculoskeletal: Neck supple.     Thyroid: No thyromegaly.  Cardiovascular:     Rate and Rhythm: Normal rate and regular rhythm.     Pulses: Normal pulses.     Heart sounds: Normal heart sounds.  Pulmonary:     Effort: Pulmonary effort is normal. No respiratory distress.     Breath sounds: Normal breath sounds.  Abdominal:     General: Bowel sounds are normal. There is no distension.     Palpations: Abdomen is soft.     Tenderness: There is no abdominal tenderness.  Musculoskeletal: Normal range of motion.     Right lower leg: No edema.     Left lower leg: No edema.  Lymphadenopathy:     Cervical: No cervical adenopathy.  Skin:    General: Skin is warm and dry.  Neurological:     Mental Status: She is alert. Mental status is at baseline.  Psychiatric:        Mood and Affect: Mood normal.      ASSESSMENT/ PLAN:  TODAY  1.  Dementia without behavioral disturbance unspecified dementia type 2. Chronic cerebrovascular accident 3.  Major depression chronic recurrent  Will continue  current medications Will continue current plan of care Will continue to monitor her status.   MD is aware of resident's narcotic use and is in agreement with current plan of care. We will attempt to wean resident as  appropriate.  Ok Edwards NP [facility]  Contact [phone] Monday through Friday 8am- 5pm  After hours call [phone]

## 2019-09-10 ENCOUNTER — Encounter: Payer: Self-pay | Admitting: Adult Health

## 2019-09-10 NOTE — Progress Notes (Signed)
Location:  [facility] Room Number: 115/W Place of Service:  SNF [phone]) Provider: Synthia Innocent NP   Patient Care Team: Margit Hanks, MD as PCP - General (Internal Medicine) [facility], [facility] Chilton Si, Chong Sicilian, NP as Nurse Practitioner (Geriatric Medicine)  Extended Emergency Contact Information Primary Emergency Contact: [name] [place] Home Phone: [phone] Relation: Son Secondary Emergency Contact: [name] [place] Home Phone: [phone] Mobile Phone: [phone] Relation: Son  Code Status: dnr Goals of Care: Advanced Directive information Advanced Directives 09/10/2019  Does Patient Have a Medical Advance Directive? Yes  Type of Advance Directive Out of facility DNR (pink MOST or yellow form)  Does patient want to make changes to medical advance directive? No - Patient declined  Copy of Healthcare Power of Attorney in Chart? -  Would patient like information on creating a medical advance directive? -  Pre-existing out of facility DNR order (yellow form or pink MOST form) -     Chief Complaint  Patient presents with  . Medicare Wellness    Annual Wellness Visit    HPI: Patient is a 85 y.o. female seen in today for an annual wellness exam.    Depression screen [facility] 2/9 09/10/2019 04/24/2019 07/23/2018 07/12/2017  Decreased Interest 0 0 0 0  Down, Depressed, Hopeless 0 0 0 3  PHQ - 2 Score 0 0 0 3  Altered sleeping - - - 1  Tired, decreased energy - - - 3  Change in appetite - - - 1  Feeling bad or failure about yourself  - - - 1  Trouble concentrating - - - 2  Moving slowly or fidgety/restless - - - 0  Suicidal thoughts - - - 0  PHQ-9 Score - - - 11  Difficult doing work/chores - - - Somewhat difficult    Fall Risk  09/10/2019 04/24/2019 07/23/2018 07/12/2017  Falls in the past year? 1 0 No No  Number falls in past yr: 0 - - -  Injury with Fall? 0 - - -   Risk for fall due to : History of fall(s);Impaired balance/gait;Impaired mobility Impaired mobility - -     Health Maintenance  Topic Date Due  . OPHTHALMOLOGY EXAM  10/31/2019 (Originally 04/05/2018)  . FOOT EXAM  11/22/2019  . URINE MICROALBUMIN  02/05/2020  . HEMOGLOBIN A1C  02/09/2020  . TETANUS/TDAP  07/13/2027  . INFLUENZA VACCINE  Completed  . DEXA SCAN  Completed  . PNA vac Low Risk Adult  Completed     Functional Status Survey: Is the patient deaf or have difficulty hearing?: No Does the patient have difficulty seeing, even when wearing glasses/contacts?: No Does the patient have difficulty concentrating, remembering, or making decisions?: Yes Does the patient have difficulty walking or climbing stairs?: Yes Does the patient have difficulty dressing or bathing?: Yes   Past Medical History:  Diagnosis Date  . Asthma   . Dementia (HCC)   . Diabetes mellitus   . H. pylori infection MAR 2014  . Hypertension   . Stroke [facility])     Past Surgical History:  Procedure Laterality Date  . CHOLECYSTECTOMY    . COLECTOMY     secondary to diverticulitis  . ESOPHAGOGASTRODUODENOSCOPY (EGD) WITH ESOPHAGEAL DILATION N/A 01/07/2013   [id number] ring was found at the gastroesophageal junction/SINGLE DUODENAL DIVERTICULUM    Family History  Problem Relation Age of Onset  . Colon cancer Neg Hx  Social History   Socioeconomic History  . Marital status: Widowed    Spouse name: Not on file  . Number of children: Not on file  . Years of education: Not on file  . Highest education level: Not on file  Occupational History  . Occupation: retired   Engineer, productionocial Needs  . Financial resource strain: Not hard at all  . Food insecurity    Worry: Never true    Inability: Never true  . Transportation needs    Medical: No    Non-medical: No  Tobacco Use  . Smoking status: Never Smoker  . Smokeless tobacco: Never Used  Substance and Sexual Activity  . Alcohol use: No  . Drug  use: No  . Sexual activity: Not Currently  Lifestyle  . Physical activity    Days per week: 0 days    Minutes per session: 0 min  . Stress: Not at all  Relationships  . Social Musicianconnections    Talks on phone: Never    Gets together: Never    Attends religious service: Never    Active member of club or organization: No    Attends meetings of clubs or organizations: Never    Relationship status: Widowed  Other Topics Concern  . Not on file  Social History Narrative   [facility]     reports that she has never smoked. She has never used smokeless tobacco. She reports that she does not drink alcohol or use drugs.   No Known Allergies  Outpatient Encounter Medications as of 09/10/2019  Medication Sig  . Balsam [place]-Castor Oil (VENELEX) OINT Apply to sacrum and bilateral buttocks q shift & prn every shift  . Calcium Carbonate-Vitamin D (CALCIUM 500/VITAMIN D PO) Take 500 mg by mouth 2 (two) times daily.  . citalopram (CELEXA) 20 MG tablet Take 20 mg by mouth daily.  Marland Kitchen. denosumab (PROLIA) 60 MG/ML SOLN injection Inject 60 mg into the skin every 6 (six) months. Administer in upper arm, thigh, or abdomen  . loratadine (CLARITIN) 10 MG tablet Take 10 mg by mouth daily.  . memantine (NAMENDA) 10 MG tablet Take 10 mg by mouth 2 (two) times daily.  . NON FORMULARY Diet Type:  Continue patient with Dysphagia 2 with honey thickened liquids.  Add ground meat to lunch and dinner trays - no sausage, no gravy on meat - gravy in bowls.  Continue HTL  . NON FORMULARY Take 1 each by mouth 3 (three) times daily between meals. Magic Cup  . omeprazole (PRILOSEC) 40 MG capsule Take 40 mg by mouth 2 (two) times daily.   . ondansetron (ZOFRAN) 4 MG tablet Take 4 mg by mouth 3 (three) times daily before meals.    No facility-administered encounter medications on file as of 09/10/2019.      Review of Systems:  Review of Systems  Physical Exam: Vitals:   09/10/19 1150  BP: 130/73   Pulse: 63  Resp: 18  Temp: 98.2 F (36.8 C)  TempSrc: Oral  SpO2: 95%  Weight: 127 lb 6.4 oz (57.8 kg)  Height: 5\' 5"  (1.651 m)   Body mass index is 21.2 kg/m. Physical Exam    SIGNIFICANT DIAGNOSTIC EXAMS   LABS REVIEWED PREVIOUS   11-19-18; wbc 5.2; hgb 12.0; hct 38.4; mcv 100.5 plt 255; glucose 89; bun 13; creat 0.76; k+ 3.6; na++ 142; ca 8.3 hgb a1c 5.5 02-05-19: urine micro-albumin  126.8 03-25-19: wbc 7.7; hgb 14.2; hct 44.7; mcv 100.9;  plt 263; glucose 118; bun 13; creat 0.86; k+ 4.7; an++ 142; ca 9.4; vit B 12: 361  06-12-19: wbc 8.1; hgb 13.4; hct 42.6; mcv 100;0 plt 278; 08-11-19: hgb a1c 6.1   NO NEW LABS     Assessment/Plan          This encounter was created in error - please disregard.

## 2019-09-21 ENCOUNTER — Other Ambulatory Visit (HOSPITAL_COMMUNITY)
Admission: RE | Admit: 2019-09-21 | Discharge: 2019-09-21 | Disposition: A | Discharge status: Home / Self Care | Payer: Medicare Other | Source: Ambulatory Visit | Attending: Internal Medicine | Admitting: Internal Medicine

## 2019-09-21 ENCOUNTER — Other Ambulatory Visit: Payer: Self-pay | Admitting: Internal Medicine

## 2019-09-21 DIAGNOSIS — Z20828 Contact with and (suspected) exposure to other viral communicable diseases: Secondary | ICD-10-CM | POA: Insufficient documentation

## 2019-09-21 DIAGNOSIS — Z9189 Other specified personal risk factors, not elsewhere classified: Secondary | ICD-10-CM

## 2019-09-23 ENCOUNTER — Non-Acute Institutional Stay (SKILLED_NURSING_FACILITY): Payer: Medicare Other | Admitting: Adult Health

## 2019-09-23 ENCOUNTER — Encounter: Payer: Self-pay | Admitting: Adult Health

## 2019-09-23 DIAGNOSIS — I10 Essential (primary) hypertension: Secondary | ICD-10-CM

## 2019-09-23 DIAGNOSIS — F0390 Unspecified dementia without behavioral disturbance: Secondary | ICD-10-CM

## 2019-09-23 DIAGNOSIS — R1312 Dysphagia, oropharyngeal phase: Secondary | ICD-10-CM | POA: Diagnosis not present

## 2019-09-23 LAB — SARS CORONAVIRUS 2 (TAT 6-24 HRS): SARS Coronavirus 2: NEGATIVE

## 2019-09-23 NOTE — Progress Notes (Signed)
Location:    [facility] Room Number: 115/W Place of Service:  SNF (31)   CODE STATUS: DNR   No Known Allergies  Chief Complaint  Patient presents with  . Medical Management of Chronic Issues       Unspecified type dementia without behavioral disturbance:  Essential hypertension:    Dysphagia oropharyngeal phase    HPI:  She is a 85 year old long term resident of this facility being seen for the management of her chronic illnesses: dementia hypertension; dysphagia. No reports of aspiration. No reports of uncontrolled pain; no reports of agitation or anxiety.    Past Medical History:  Diagnosis Date  . Asthma   . Dementia (HCC)   . Diabetes mellitus   . H. pylori infection MAR 2014  . Hypertension   . Stroke [facility])     Past Surgical History:  Procedure Laterality Date  . CHOLECYSTECTOMY    . COLECTOMY     secondary to diverticulitis  . ESOPHAGOGASTRODUODENOSCOPY (EGD) WITH ESOPHAGEAL DILATION N/A 01/07/2013   [id number] ring was found at the gastroesophageal junction/SINGLE DUODENAL DIVERTICULUM    Social History   Socioeconomic History  . Marital status: Widowed    Spouse name: Not on file  . Number of children: Not on file  . Years of education: Not on file  . Highest education level: Not on file  Occupational History  . Occupation: retired   Engineer, production  . Financial resource strain: Not hard at all  . Food insecurity    Worry: Never true    Inability: Never true  . Transportation needs    Medical: No    Non-medical: No  Tobacco Use  . Smoking status: Never Smoker  . Smokeless tobacco: Never Used  Substance and Sexual Activity  . Alcohol use: No  . Drug use: No  . Sexual activity: Not Currently  Lifestyle  . Physical activity    Days per week: 0 days    Minutes per session: 0 min  . Stress: Not at all  Relationships  . Social Musician on phone: Never    Gets together: Never    Attends religious service:  Never    Active member of club or organization: No    Attends meetings of clubs or organizations: Never    Relationship status: Widowed  . Intimate partner violence    Fear of current or ex partner: No    Emotionally abused: No    Physically abused: No    Forced sexual activity: No  Other Topics Concern  . Not on file  Social History Narrative   Is a long term resident of [facility]    Family History  Problem Relation Age of Onset  . Colon cancer Neg Hx       VITAL SIGNS BP 129/78   Pulse 78   Temp (!) 97.2 F (36.2 C) (Oral)   Resp 18   Ht 5\' 5"  (1.651 m)   Wt 125 lb (56.7 kg)   SpO2 95%   BMI 20.80 kg/m   Outpatient Encounter Medications as of 09/23/2019  Medication Sig  . Balsam [place]-Castor Oil (VENELEX) OINT Apply to sacrum and bilateral buttocks q shift & prn every shift  . Calcium Carbonate-Vitamin D (CALCIUM 500/VITAMIN D PO) Take 500 mg by mouth 2 (two) times daily.  . citalopram (CELEXA) 20 MG tablet Take 10 mg by mouth daily.   14/05/2019 denosumab (PROLIA) 60 MG/ML SOLN injection Inject 60  mg into the skin every 6 (six) months. Administer in upper arm, thigh, or abdomen  . loratadine (CLARITIN) 10 MG tablet Take 10 mg by mouth daily.  . memantine (NAMENDA) 10 MG tablet Take 10 mg by mouth 2 (two) times daily.  . NON FORMULARY Diet Type:  Continue patient with Dysphagia 2 with honey thickened liquids.  Add ground meat to lunch and dinner trays - no sausage, no gravy on meat - gravy in bowls.  Continue HTL  . NON FORMULARY Take 1 each by mouth 3 (three) times daily between meals. Magic Cup  . omeprazole (PRILOSEC) 40 MG capsule Take 40 mg by mouth 2 (two) times daily.   . ondansetron (ZOFRAN) 4 MG tablet Take 4 mg by mouth 3 (three) times daily before meals.    No facility-administered encounter medications on file as of 09/23/2019.      SIGNIFICANT DIAGNOSTIC EXAMS   LABS REVIEWED PREVIOUS   11-19-18; wbc 5.2; hgb 12.0; hct 38.4; mcv 100.5 plt 255; glucose 89; bun 13;  creat 0.76; k+ 3.6; na++ 142; ca 8.3 hgb a1c 5.5 02-05-19: urine micro-albumin  126.8 03-25-19: wbc 7.7; hgb 14.2; hct 44.7; mcv 100.9; plt 263; glucose 118; bun 13; creat 0.86; k+ 4.7; an++ 142; ca 9.4; vit B 12: 361  06-12-19: wbc 8.1; hgb 13.4; hct 42.6; mcv 100;0 plt 278; 08-11-19: hgb a1c 6.1   NO NEW LABS   Review of Systems  Unable to perform ROS: Dementia (unable to participate )    Physical Exam Constitutional:      General: She is not in acute distress.    Appearance: She is well-developed. She is not diaphoretic.  Neck:     Thyroid: No thyromegaly.  Cardiovascular:     Rate and Rhythm: Normal rate and regular rhythm.     Pulses: Normal pulses.     Heart sounds: Normal heart sounds.  Pulmonary:     Effort: Pulmonary effort is normal. No respiratory distress.     Breath sounds: Normal breath sounds.  Abdominal:     General: Bowel sounds are normal. There is no distension.     Palpations: Abdomen is soft.     Tenderness: There is no abdominal tenderness.  Musculoskeletal:        General: Normal range of motion.     Cervical back: Neck supple.     Right lower leg: No edema.     Left lower leg: No edema.  Lymphadenopathy:     Cervical: No cervical adenopathy.  Skin:    General: Skin is warm and dry.  Neurological:     Mental Status: She is alert. Mental status is at baseline.  Psychiatric:        Mood and Affect: Mood normal.     ASSESSMENT/ PLAN:  TODAY  1. Unspecified type dementia without behavioral disturbance: is without change weight is stable at 125 pounds will continue namenda 10 mg twice daily  2. Essential hypertension: is stable b/p 129/78 will continue to monitor her status.  3. Dysphagia oropharyngeal phase: no signs of aspiration; will continue honey thick liquids.    PREVIOUS   4. Chronic CVA is neurologically stable will continue to monitor her status   5. Chronic non-seasonal allergic rhinitis: is stable will continue claritin 10 mg daily  will stop atrovent neb due to nonuse   6. GERD without esophagitis: is stable will continue prilosec 40 mg twice daily and zofran 4 mg prior to meals.   7. Major depression  recurrent chronic: is stable  Will continue celexa 20 mg daily   8. Age related osteoporosis without current pathological fracture: is stable will continue prolia 60 mg every 6 months and supplements          MD is aware of resident's narcotic use and is in agreement with current plan of care. We will attempt to wean resident as appropriate.  Ok Edwards NP [facility]  Contact [phone] Monday through Friday 8am- 5pm  After hours call [phone]

## 2019-09-27 ENCOUNTER — Other Ambulatory Visit (HOSPITAL_COMMUNITY)
Admission: RE | Admit: 2019-09-27 | Discharge: 2019-09-27 | Disposition: A | Discharge status: Home / Self Care | Payer: Medicare Other | Source: Ambulatory Visit | Attending: Internal Medicine | Admitting: Internal Medicine

## 2019-09-27 ENCOUNTER — Other Ambulatory Visit: Payer: Self-pay | Admitting: Internal Medicine

## 2019-09-27 DIAGNOSIS — Z9189 Other specified personal risk factors, not elsewhere classified: Secondary | ICD-10-CM

## 2019-09-27 DIAGNOSIS — Z20828 Contact with and (suspected) exposure to other viral communicable diseases: Secondary | ICD-10-CM | POA: Diagnosis present

## 2019-09-27 LAB — SARS CORONAVIRUS 2 (TAT 6-24 HRS): SARS Coronavirus 2: NEGATIVE

## 2019-10-01 ENCOUNTER — Other Ambulatory Visit (HOSPITAL_COMMUNITY)
Admission: RE | Admit: 2019-10-01 | Discharge: 2019-10-01 | Disposition: A | Discharge status: Home / Self Care | Payer: Medicare Other | Source: Ambulatory Visit | Attending: Internal Medicine | Admitting: Internal Medicine

## 2019-10-01 DIAGNOSIS — Z20828 Contact with and (suspected) exposure to other viral communicable diseases: Secondary | ICD-10-CM | POA: Diagnosis present

## 2019-10-02 LAB — NOVEL CORONAVIRUS, NAA (HOSP ORDER, SEND-OUT TO REF LAB; TAT 18-24 HRS): SARS-CoV-2, NAA: NOT DETECTED

## 2019-10-06 ENCOUNTER — Other Ambulatory Visit (HOSPITAL_COMMUNITY)
Admission: RE | Admit: 2019-10-06 | Discharge: 2019-10-06 | Disposition: A | Discharge status: Home / Self Care | Payer: Medicare Other | Source: Ambulatory Visit | Attending: Internal Medicine | Admitting: Internal Medicine

## 2019-10-06 DIAGNOSIS — Z20828 Contact with and (suspected) exposure to other viral communicable diseases: Secondary | ICD-10-CM | POA: Diagnosis present

## 2019-10-07 ENCOUNTER — Non-Acute Institutional Stay (SKILLED_NURSING_FACILITY): Payer: Medicare Other | Admitting: Adult Health

## 2019-10-07 DIAGNOSIS — I10 Essential (primary) hypertension: Secondary | ICD-10-CM | POA: Diagnosis not present

## 2019-10-07 DIAGNOSIS — F0390 Unspecified dementia without behavioral disturbance: Secondary | ICD-10-CM

## 2019-10-08 ENCOUNTER — Encounter: Payer: Self-pay | Admitting: Adult Health

## 2019-10-08 LAB — NOVEL CORONAVIRUS, NAA (HOSP ORDER, SEND-OUT TO REF LAB; TAT 18-24 HRS): SARS-CoV-2, NAA: NOT DETECTED

## 2019-10-08 NOTE — Progress Notes (Signed)
Location:    [place] Room Number: 115/W Place of Service:  SNF (31)   CODE STATUS: DNR  No Known Allergies  Chief Complaint  Patient presents with  . Acute Visit    COVID Vaccine Permission    HPI:  We have the mederna vaccine. Her risk factors include: advanced age; long term resident of snf; hypertension and dementia. I have spoken with her family regarding 2 step injections; effectiveness; side effects; and possible adverse reactions. There are no reports of uncontrolled pain; no changes in appetite; no reports of anxiety or agitation.   Past Medical History:  Diagnosis Date  . Asthma   . Dementia ([place])   . Diabetes mellitus   . H. pylori infection MAR 2014  . Hypertension   . Stroke [facility])     Past Surgical History:  Procedure Laterality Date  . CHOLECYSTECTOMY    . COLECTOMY     secondary to diverticulitis  . ESOPHAGOGASTRODUODENOSCOPY (EGD) WITH ESOPHAGEAL DILATION N/A 01/07/2013   [id number] ring was found at the gastroesophageal junction/SINGLE DUODENAL DIVERTICULUM    Social History   Socioeconomic History  . Marital status: Widowed    Spouse name: Not on file  . Number of children: Not on file  . Years of education: Not on file  . Highest education level: Not on file  Occupational History  . Occupation: retired   Tobacco Use  . Smoking status: Never Smoker  . Smokeless tobacco: Never Used  Substance and Sexual Activity  . Alcohol use: No  . Drug use: No  . Sexual activity: Not Currently  Other Topics Concern  . Not on file  Social History Narrative   Is a long [facility] resident of [facility]    Social Determinants of Health   Financial Resource Strain:   . Difficulty of Paying Living Expenses: Not on file  Food Insecurity:   . Worried About Charity fundraiser in the Last Year: Not on file  . Ran Out of Food in the Last Year: Not on file  Transportation Needs:   . Lack of Transportation (Medical): Not on file  . Lack of  Transportation (Non-Medical): Not on file  Physical Activity:   . Days of Exercise per Week: Not on file  . Minutes of Exercise per Session: Not on file  Stress: No Stress Concern Present  . Feeling of Stress : Not at all  Social Connections:   . Frequency of Communication with Friends and Family: Not on file  . Frequency of Social Gatherings with Friends and Family: Not on file  . Attends Religious Services: Not on file  . Active Member of Clubs or Organizations: Not on file  . Attends Archivist Meetings: Not on file  . Marital Status: Not on file  Intimate Partner Violence:   . Fear of Current or Ex-Partner: Not on file  . Emotionally Abused: Not on file  . Physically Abused: Not on file  . Sexually Abused: Not on file   Family History  Problem Relation Age of Onset  . Colon cancer Neg Hx       VITAL SIGNS BP 129/62   Pulse 65   Temp 99 F (37.2 C) (Oral)   Resp 18   Ht 5\' 5"  (1.651 m)   Wt 125 lb (56.7 kg)   SpO2 95%   BMI 20.80 kg/m   Outpatient Encounter Medications as of 10/07/2019  Medication Sig  . ascorbic acid (VITAMIN C) 500 MG  tablet Take 500 mg by mouth daily.  [name]. Balsam [place]-Castor Oil (VENELEX) OINT Apply to sacrum and bilateral buttocks q shift & prn every shift  . Calcium Carbonate-Vitamin D (CALCIUM 500/VITAMIN D PO) Take 500 mg by mouth 2 (two) times daily.  . Cholecalciferol 1.25 MG (50000 UT) capsule Take 50,000 Units by mouth. Once a day on Thursday  . citalopram (CELEXA) 20 MG tablet Take 10 mg by mouth daily.   Marland Kitchen. denosumab (PROLIA) 60 MG/ML SOLN injection Inject 60 mg into the skin every 6 (six) months. Administer in upper arm, thigh, or abdomen  . guaiFENesin-dextromethorphan (ROBITUSSIN DM) 100-10 MG/5ML syrup Take 15 mLs by mouth every 6 (six) hours as needed for cough.  . loratadine (CLARITIN) 10 MG tablet Take 10 mg by mouth daily.  . memantine (NAMENDA) 10 MG tablet Take 10 mg by mouth 2 (two) times daily.  . NON FORMULARY Diet  Type:  Continue patient with Dysphagia 2 with honey thickened liquids.  Add ground meat to lunch and dinner trays - no sausage, no gravy on meat - gravy in bowls.  Continue HTL  . NON FORMULARY Take 1 each by mouth 3 (three) times daily between meals. Magic Cup  . omeprazole (PRILOSEC) 40 MG capsule Take 40 mg by mouth 2 (two) times daily.   . ondansetron (ZOFRAN) 4 MG tablet Take 4 mg by mouth 3 (three) times daily before meals.   . zinc sulfate 220 (50 Zn) MG capsule Take 220 mg by mouth daily.   No facility-administered encounter medications on file as of 10/07/2019.     SIGNIFICANT DIAGNOSTIC EXAMS   LABS REVIEWED PREVIOUS   11-19-18; wbc 5.2; hgb 12.0; hct 38.4; mcv 100.5 plt 255; glucose 89; bun 13; creat 0.76; k+ 3.6; na++ 142; ca 8.3 hgb a1c 5.5 02-05-19: urine micro-albumin  126.8 03-25-19: wbc 7.7; hgb 14.2; hct 44.7; mcv 100.9; plt 263; glucose 118; bun 13; creat 0.86; k+ 4.7; an++ 142; ca 9.4; vit B 12: 361  06-12-19: wbc 8.1; hgb 13.4; hct 42.6; mcv 100;0 plt 278; 08-11-19: hgb a1c 6.1   NO NEW LABS   Review of Systems  Unable to perform ROS: Dementia (unable to participate )   Physical Exam Constitutional:      General: She is not in acute distress.    Appearance: She is well-developed. She is not diaphoretic.  Neck:     Thyroid: No thyromegaly.  Cardiovascular:     Rate and Rhythm: Normal rate and regular rhythm.     Pulses: Normal pulses.     Heart sounds: Normal heart sounds.  Pulmonary:     Effort: Pulmonary effort is normal. No respiratory distress.     Breath sounds: Normal breath sounds.  Abdominal:     General: Bowel sounds are normal. There is no distension.     Palpations: Abdomen is soft.     Tenderness: There is no abdominal tenderness.  Musculoskeletal:        General: Normal range of motion.     Cervical back: Neck supple.     Right lower leg: No edema.     Left lower leg: No edema.  Lymphadenopathy:     Cervical: No cervical adenopathy.   Skin:    General: Skin is warm and dry.  Neurological:     Mental Status: She is alert. Mental status is at baseline.  Psychiatric:        Mood and Affect: Mood normal.       ASSESSMENT/  PLAN:  TODAY;   1. Long term resident of snf 2. advanced age 85. Essential  hypertension  4. Dementia without behavioral disturbance unspecified dementia type.   Will setup for the first injection on 10-14-19.     MD is aware of resident's narcotic use and is in agreement with current plan of care. We will attempt to wean resident as appropriate.  Synthia Innocent NP [facility] Adult Medicine  Contact [phone] Monday through Friday 8am- 5pm  After hours call [phone]

## 2019-10-12 ENCOUNTER — Other Ambulatory Visit (HOSPITAL_COMMUNITY)
Admission: RE | Admit: 2019-10-12 | Discharge: 2019-10-12 | Disposition: A | Discharge status: Home / Self Care | Payer: Medicare Other | Source: Ambulatory Visit | Attending: Adult Health | Admitting: Adult Health

## 2019-10-12 DIAGNOSIS — Z20828 Contact with and (suspected) exposure to other viral communicable diseases: Secondary | ICD-10-CM | POA: Diagnosis present

## 2019-10-13 LAB — NOVEL CORONAVIRUS, NAA (HOSP ORDER, SEND-OUT TO REF LAB; TAT 18-24 HRS): SARS-CoV-2, NAA: NOT DETECTED

## 2019-10-16 ENCOUNTER — Encounter: Payer: Self-pay | Admitting: Adult Health

## 2019-10-16 ENCOUNTER — Non-Acute Institutional Stay (SKILLED_NURSING_FACILITY): Payer: Medicare Other | Admitting: Adult Health

## 2019-10-16 DIAGNOSIS — F339 Major depressive disorder, recurrent, unspecified: Secondary | ICD-10-CM

## 2019-10-16 NOTE — Progress Notes (Signed)
Location:    [place] Room Number: 115/W Place of Service:  SNF (31)   CODE STATUS: DNR  No Known Allergies  Chief Complaint  Patient presents with  . Acute Visit    Med Management    HPI:  We have come together to discuss her use of celexa.she was lowered  to 10 mg daily. Staff reports that her appetite has gotten worse; and that she is crying more often. There are no reports of uncontrolled pain.   Past Medical History:  Diagnosis Date  . Asthma   . Dementia ([place])   . Diabetes mellitus   . H. pylori infection MAR 2014  . Hypertension   . Stroke [facility])     Past Surgical History:  Procedure Laterality Date  . CHOLECYSTECTOMY    . COLECTOMY     secondary to diverticulitis  . ESOPHAGOGASTRODUODENOSCOPY (EGD) WITH ESOPHAGEAL DILATION N/A 01/07/2013   [id number] ring was found at the gastroesophageal junction/SINGLE DUODENAL DIVERTICULUM    Social History   Socioeconomic History  . Marital status: Widowed    Spouse name: Not on file  . Number of children: Not on file  . Years of education: Not on file  . Highest education level: Not on file  Occupational History  . Occupation: retired   Tobacco Use  . Smoking status: Never Smoker  . Smokeless tobacco: Never Used  Substance and Sexual Activity  . Alcohol use: No  . Drug use: No  . Sexual activity: Not Currently  Other Topics Concern  . Not on file  Social History Narrative   Is a long term resident of [facility]    Social Determinants of Health   Financial Resource Strain:   . Difficulty of Paying Living Expenses: Not on file  Food Insecurity:   . Worried About Charity fundraiser in the Last Year: Not on file  . Ran Out of Food in the Last Year: Not on file  Transportation Needs:   . Lack of Transportation (Medical): Not on file  . Lack of Transportation (Non-Medical): Not on file  Physical Activity:   . Days of Exercise per Week: Not on file  . Minutes of Exercise per Session:  Not on file  Stress: No Stress Concern Present  . Feeling of Stress : Not at all  Social Connections:   . Frequency of Communication with Friends and Family: Not on file  . Frequency of Social Gatherings with Friends and Family: Not on file  . Attends Religious Services: Not on file  . Active Member of Clubs or Organizations: Not on file  . Attends Archivist Meetings: Not on file  . Marital Status: Not on file  Intimate Partner Violence:   . Fear of Current or Ex-Partner: Not on file  . Emotionally Abused: Not on file  . Physically Abused: Not on file  . Sexually Abused: Not on file   Family History  Problem Relation Age of Onset  . Colon cancer Neg Hx       VITAL SIGNS BP 129/62   Pulse 65   Temp 99 F (37.2 C) (Oral)   Resp 18   Ht 5\' 5"  (1.651 m)   Wt 125 lb (56.7 kg)   SpO2 95%   BMI 20.80 kg/m   Outpatient Encounter Medications as of 10/16/2019  Medication Sig  . Balsam [place]-Castor Oil (VENELEX) OINT Apply to sacrum and bilateral buttocks q shift & prn every shift  . Calcium Carbonate-Vitamin  D (CALCIUM 500/VITAMIN D PO) Take 500 mg by mouth 2 (two) times daily.  . citalopram (CELEXA) 20 MG tablet Take 20 mg by mouth daily.   Marland Kitchen denosumab (PROLIA) 60 MG/ML SOLN injection Inject 60 mg into the skin every 6 (six) months. Administer in upper arm, thigh, or abdomen  . guaiFENesin-dextromethorphan (ROBITUSSIN DM) 100-10 MG/5ML syrup Take 15 mLs by mouth every 6 (six) hours as needed for cough.  . loratadine (CLARITIN) 10 MG tablet Take 10 mg by mouth daily.  . memantine (NAMENDA) 10 MG tablet Take 10 mg by mouth 2 (two) times daily.  . NON FORMULARY Diet Type:  Continue patient with Dysphagia 2 with honey thickened liquids.  Add ground meat to lunch and dinner trays - no sausage, no gravy on meat - gravy in bowls.  Continue HTL  . NON FORMULARY Take 1 each by mouth 3 (three) times daily between meals. Magic Cup  . omeprazole (PRILOSEC) 40 MG capsule Take 40 mg  by mouth 2 (two) times daily.   . ondansetron (ZOFRAN) 4 MG tablet Take 4 mg by mouth 3 (three) times daily before meals.    No facility-administered encounter medications on file as of 10/16/2019.     SIGNIFICANT DIAGNOSTIC EXAMS    LABS REVIEWED PREVIOUS   11-19-18; wbc 5.2; hgb 12.0; hct 38.4; mcv 100.5 plt 255; glucose 89; bun 13; creat 0.76; k+ 3.6; na++ 142; ca 8.3 hgb a1c 5.5 02-05-19: urine micro-albumin  126.8 03-25-19: wbc 7.7; hgb 14.2; hct 44.7; mcv 100.9; plt 263; glucose 118; bun 13; creat 0.86; k+ 4.7; an++ 142; ca 9.4; vit B 12: 361  06-12-19: wbc 8.1; hgb 13.4; hct 42.6; mcv 100;0 plt 278; 08-11-19: hgb a1c 6.1   NO NEW LABS   Review of Systems  Unable to perform ROS: Dementia (unable to participoate )   Physical Exam Constitutional:      General: She is not in acute distress.    Appearance: She is well-developed. She is not diaphoretic.  Neck:     Thyroid: No thyromegaly.  Cardiovascular:     Rate and Rhythm: Normal rate and regular rhythm.     Pulses: Normal pulses.     Heart sounds: Normal heart sounds.  Pulmonary:     Effort: Pulmonary effort is normal. No respiratory distress.     Breath sounds: Normal breath sounds.  Abdominal:     General: Bowel sounds are normal. There is no distension.     Palpations: Abdomen is soft.     Tenderness: There is no abdominal tenderness.  Musculoskeletal:        General: Normal range of motion.     Cervical back: Neck supple.     Right lower leg: No edema.     Left lower leg: No edema.  Lymphadenopathy:     Cervical: No cervical adenopathy.  Skin:    General: Skin is warm and dry.  Neurological:     Mental Status: She is alert. Mental status is at baseline.  Psychiatric:        Mood and Affect: Mood normal.       ASSESSMENT/ PLAN:  TODAY  1. Major depression recurrent chronic: is worse; will increase her to celexa 20 mg daily and will monitor her status.   MD is aware of resident's narcotic use and is  in agreement with current plan of care. We will attempt to wean resident as appropriate.  Synthia Innocent NP [facility]  Contact [phone] Monday through  Friday 8am- 5pm  After hours call [phone]

## 2019-10-18 DIAGNOSIS — Z5189 Encounter for other specified aftercare: Secondary | ICD-10-CM | POA: Diagnosis not present

## 2019-10-18 DIAGNOSIS — Z1159 Encounter for screening for other viral diseases: Secondary | ICD-10-CM | POA: Diagnosis not present

## 2019-10-27 ENCOUNTER — Non-Acute Institutional Stay (SKILLED_NURSING_FACILITY): Payer: Medicare Other | Admitting: Adult Health

## 2019-10-27 ENCOUNTER — Encounter: Payer: Self-pay | Admitting: Adult Health

## 2019-10-27 DIAGNOSIS — I6930 Unspecified sequelae of cerebral infarction: Secondary | ICD-10-CM

## 2019-10-27 DIAGNOSIS — J3089 Other allergic rhinitis: Secondary | ICD-10-CM | POA: Diagnosis not present

## 2019-10-27 DIAGNOSIS — Z9181 History of falling: Secondary | ICD-10-CM | POA: Diagnosis not present

## 2019-10-27 DIAGNOSIS — R296 Repeated falls: Secondary | ICD-10-CM | POA: Diagnosis not present

## 2019-10-27 DIAGNOSIS — K219 Gastro-esophageal reflux disease without esophagitis: Secondary | ICD-10-CM

## 2019-10-27 DIAGNOSIS — M6281 Muscle weakness (generalized): Secondary | ICD-10-CM | POA: Diagnosis not present

## 2019-10-27 DIAGNOSIS — M810 Age-related osteoporosis without current pathological fracture: Secondary | ICD-10-CM | POA: Diagnosis not present

## 2019-10-27 NOTE — Progress Notes (Signed)
Location:    [place] Room Number: 355D Place of Service:  SNF ([id number]) Phillips Grout NP    CODE STATUS: DNR  No Known Allergies  Chief Complaint  Patient presents with  . Medical Management of Chronic Issues         Chronic CVA:  Chronic non-seasonal allergic rhinitis: GERD without esophagitis:     HPI:  She is a 86 year old long term resident of this facility being seen for the management of her chronic illnesses: cva; rhinitis; gerd. There are no reports of nausea or vomiting; no uncontrolled pain; no sinus congestion.   Past Medical History:  Diagnosis Date  . Asthma   . Dementia ([place])   . Diabetes mellitus   . H. pylori infection MAR 2014  . Hypertension   . Stroke [facility])     Past Surgical History:  Procedure Laterality Date  . CHOLECYSTECTOMY    . COLECTOMY     secondary to diverticulitis  . ESOPHAGOGASTRODUODENOSCOPY (EGD) WITH ESOPHAGEAL DILATION N/A 01/07/2013   DUK:[id number] ring was found at the gastroesophageal junction/SINGLE DUODENAL DIVERTICULUM    Social History   Socioeconomic History  . Marital status: Widowed    Spouse name: Not on file  . Number of children: Not on file  . Years of education: Not on file  . Highest education level: Not on file  Occupational History  . Occupation: retired   Tobacco Use  . Smoking status: Never Smoker  . Smokeless tobacco: Never Used  Substance and Sexual Activity  . Alcohol use: No  . Drug use: No  . Sexual activity: Not Currently  Other Topics Concern  . Not on file  Social History Narrative   Is a long term resident of [facility]    Social Determinants of Health   Financial Resource Strain:   . Difficulty of Paying Living Expenses: Not on file  Food Insecurity:   . Worried About Charity fundraiser in the Last Year: Not on file  . Ran Out of Food in the Last Year: Not on file  Transportation Needs:   . Lack of Transportation (Medical): Not on file  . Lack of Transportation  (Non-Medical): Not on file  Physical Activity:   . Days of Exercise per Week: Not on file  . Minutes of Exercise per Session: Not on file  Stress: No Stress Concern Present  . Feeling of Stress : Not at all  Social Connections:   . Frequency of Communication with Friends and Family: Not on file  . Frequency of Social Gatherings with Friends and Family: Not on file  . Attends Religious Services: Not on file  . Active Member of Clubs or Organizations: Not on file  . Attends Archivist Meetings: Not on file  . Marital Status: Not on file  Intimate Partner Violence:   . Fear of Current or Ex-Partner: Not on file  . Emotionally Abused: Not on file  . Physically Abused: Not on file  . Sexually Abused: Not on file   Family History  Problem Relation Age of Onset  . Colon cancer Neg Hx       VITAL SIGNS BP 94/62   Pulse 85   Temp 98.4 F (36.9 C) (Oral)   Resp 20   Ht 5\' 5"  (1.651 m)   Wt 118 lb 12.8 oz (53.9 kg)   SpO2 95%   BMI 19.77 kg/m   Outpatient Encounter Medications as of 10/27/2019  Medication  Sig  . Balsam [place]-Castor Oil (VENELEX) OINT Apply to sacrum and bilateral buttocks q shift & prn every shift  . Calcium Carbonate-Vitamin D (CALCIUM 500/VITAMIN D PO) Take 500 mg by mouth 2 (two) times daily.  . citalopram (CELEXA) 20 MG tablet Take 20 mg by mouth daily.   Marland Kitchen denosumab (PROLIA) 60 MG/ML SOLN injection Inject 60 mg into the skin every 6 (six) months. Administer in upper arm, thigh, or abdomen  . loratadine (CLARITIN) 10 MG tablet Take 10 mg by mouth daily.  . memantine (NAMENDA) 10 MG tablet Take 10 mg by mouth 2 (two) times daily.  . NON FORMULARY Diet Type:  Continue patient with Dysphagia 2 with honey thickened liquids.  Add ground meat to lunch and dinner trays - no sausage, no gravy on meat - gravy in bowls.  Continue HTL  . NON FORMULARY Take 1 each by mouth 3 (three) times daily between meals. Magic Cup  . omeprazole (PRILOSEC) 40 MG capsule Take  40 mg by mouth 2 (two) times daily.   . ondansetron (ZOFRAN) 4 MG tablet Take 4 mg by mouth 3 (three) times daily before meals.   . [DISCONTINUED] guaiFENesin-dextromethorphan (ROBITUSSIN DM) 100-10 MG/5ML syrup Take 15 mLs by mouth every 6 (six) hours as needed for cough.   No facility-administered encounter medications on file as of 10/27/2019.     SIGNIFICANT DIAGNOSTIC EXAMS   LABS REVIEWED PREVIOUS   11-19-18; wbc 5.2; hgb 12.0; hct 38.4; mcv 100.5 plt 255; glucose 89; bun 13; creat 0.76; k+ 3.6; na++ 142; ca 8.3 hgb a1c 5.5 02-05-19: urine micro-albumin  126.8 03-25-19: wbc 7.7; hgb 14.2; hct 44.7; mcv 100.9; plt 263; glucose 118; bun 13; creat 0.86; k+ 4.7; an++ 142; ca 9.4; vit B 12: 361  06-12-19: wbc 8.1; hgb 13.4; hct 42.6; mcv 100;0 plt 278; 08-11-19: hgb a1c 6.1   NO NEW LABS   Review of Systems  Unable to perform ROS: Dementia (unable to participate )    Physical Exam Constitutional:      General: She is not in acute distress.    Appearance: She is well-developed. She is not diaphoretic.  Neck:     Thyroid: No thyromegaly.  Cardiovascular:     Rate and Rhythm: Normal rate and regular rhythm.     Pulses: Normal pulses.     Heart sounds: Normal heart sounds.  Pulmonary:     Effort: Pulmonary effort is normal. No respiratory distress.     Breath sounds: Normal breath sounds.  Abdominal:     General: Bowel sounds are normal. There is no distension.     Palpations: Abdomen is soft.     Tenderness: There is no abdominal tenderness.  Musculoskeletal:        General: Normal range of motion.     Cervical back: Neck supple.     Right lower leg: No edema.     Left lower leg: No edema.  Lymphadenopathy:     Cervical: No cervical adenopathy.  Skin:    General: Skin is warm and dry.  Neurological:     Mental Status: She is alert. Mental status is at baseline.  Psychiatric:        Mood and Affect: Mood normal.    ASSESSMENT/ PLAN:  TODAY  1. Chronic CVA: is stable  will continue to monitor her status.   2. Chronic non-seasonal allergic rhinitis: is stable will continue claritin 10 mg daily   3. GERD without esophagitis: is stable will continue  prilosec 40 mg twice daily and zofran 4 mg prior to meals.   PREVIOUS   4. Major depression recurrent chronic: is stable  Will continue celexa 20 mg daily (did not tolerate wean to 10 mg)   5. Age related osteoporosis without current pathological fracture: is stable will continue prolia 60 mg every 6 months and supplements   6. Unspecified type dementia without behavioral disturbance: is without change weight is stable at 118 pounds will continue namenda 10 mg twice daily  7. Essential hypertension: is stable b/p 94/62 will continue to monitor her status.  8. Dysphagia oropharyngeal phase: no signs of aspiration; will continue honey thick liquids.     MD is aware of resident's narcotic use and is in agreement with current plan of care. We will attempt to wean resident as appropriate.  Synthia Innocent NP [facility]  Contact [phone] Monday through Friday 8am- 5pm  After hours call [phone]

## 2019-10-28 DIAGNOSIS — Z9181 History of falling: Secondary | ICD-10-CM | POA: Diagnosis not present

## 2019-10-28 DIAGNOSIS — M6281 Muscle weakness (generalized): Secondary | ICD-10-CM | POA: Diagnosis not present

## 2019-10-28 DIAGNOSIS — R296 Repeated falls: Secondary | ICD-10-CM | POA: Diagnosis not present

## 2019-10-28 DIAGNOSIS — M810 Age-related osteoporosis without current pathological fracture: Secondary | ICD-10-CM | POA: Diagnosis not present

## 2019-10-29 ENCOUNTER — Other Ambulatory Visit (HOSPITAL_COMMUNITY)
Admission: RE | Admit: 2019-10-29 | Discharge: 2019-10-29 | Disposition: A | Discharge status: Home / Self Care | Payer: Medicare Other | Source: Skilled Nursing Facility | Attending: Adult Health | Admitting: Adult Health

## 2019-10-29 DIAGNOSIS — E119 Type 2 diabetes mellitus without complications: Secondary | ICD-10-CM | POA: Insufficient documentation

## 2019-10-29 DIAGNOSIS — M810 Age-related osteoporosis without current pathological fracture: Secondary | ICD-10-CM | POA: Diagnosis not present

## 2019-10-29 DIAGNOSIS — Z9181 History of falling: Secondary | ICD-10-CM | POA: Diagnosis not present

## 2019-10-29 DIAGNOSIS — M6281 Muscle weakness (generalized): Secondary | ICD-10-CM | POA: Diagnosis not present

## 2019-10-29 DIAGNOSIS — R296 Repeated falls: Secondary | ICD-10-CM | POA: Diagnosis not present

## 2019-10-29 LAB — COMPREHENSIVE METABOLIC PANEL
ALT: 11 U/L (ref 0–44)
AST: 15 U/L (ref 15–41)
Albumin: 3.1 g/dL — ABNORMAL LOW (ref 3.5–5.0)
Alkaline Phosphatase: 60 U/L (ref 38–126)
Anion gap: 11 (ref 5–15)
BUN: 21 mg/dL (ref 8–23)
CO2: 27 mmol/L (ref 22–32)
Calcium: 9 mg/dL (ref 8.9–10.3)
Chloride: 105 mmol/L (ref 98–111)
Creatinine, Ser: 0.98 mg/dL (ref 0.44–1.00)
GFR calc Af Amer: >60 mL/min (ref >60)
GFR calc non Af Amer: 53 mL/min — ABNORMAL LOW (ref >60)
Glucose, Bld: 112 mg/dL — ABNORMAL HIGH (ref 70–99)
Potassium: 3.8 mmol/L (ref 3.5–5.1)
Sodium: 143 mmol/L (ref 135–145)
Total Bilirubin: 0.5 mg/dL (ref 0.3–1.2)
Total Protein: 6.5 g/dL (ref 6.5–8.1)

## 2019-10-29 LAB — CBC
HCT: 34.5 % — ABNORMAL LOW (ref 36.0–46.0)
Hemoglobin: 10.2 g/dL — ABNORMAL LOW (ref 12.0–15.0)
MCH: 25.1 pg — ABNORMAL LOW (ref 26.0–34.0)
MCHC: 29.6 g/dL — ABNORMAL LOW (ref 30.0–36.0)
MCV: 84.8 fL (ref 80.0–100.0)
Platelets: 322 10*3/uL (ref 150–400)
RBC: 4.07 MIL/uL (ref 3.87–5.11)
RDW: 17.1 % — ABNORMAL HIGH (ref 11.5–15.5)
WBC: 6.5 10*3/uL (ref 4.0–10.5)
nRBC: 0 % (ref 0.0–0.2)

## 2019-10-29 LAB — HEMOGLOBIN A1C
Hgb A1c MFr Bld: 6.4 % — ABNORMAL HIGH (ref 4.8–5.6)
Mean Plasma Glucose: 136.98 mg/dL

## 2019-10-30 DIAGNOSIS — R296 Repeated falls: Secondary | ICD-10-CM | POA: Diagnosis not present

## 2019-10-30 DIAGNOSIS — Z1159 Encounter for screening for other viral diseases: Secondary | ICD-10-CM | POA: Diagnosis not present

## 2019-10-30 DIAGNOSIS — M6281 Muscle weakness (generalized): Secondary | ICD-10-CM | POA: Diagnosis not present

## 2019-10-30 DIAGNOSIS — Z5189 Encounter for other specified aftercare: Secondary | ICD-10-CM | POA: Diagnosis not present

## 2019-10-30 DIAGNOSIS — M810 Age-related osteoporosis without current pathological fracture: Secondary | ICD-10-CM | POA: Diagnosis not present

## 2019-10-30 DIAGNOSIS — Z9181 History of falling: Secondary | ICD-10-CM | POA: Diagnosis not present

## 2019-10-31 ENCOUNTER — Other Ambulatory Visit (HOSPITAL_COMMUNITY)
Admission: RE | Admit: 2019-10-31 | Discharge: 2019-10-31 | Disposition: A | Discharge status: Home / Self Care | Payer: Medicare Other | Source: Skilled Nursing Facility | Attending: Adult Health | Admitting: Adult Health

## 2019-10-31 DIAGNOSIS — R195 Other fecal abnormalities: Secondary | ICD-10-CM | POA: Diagnosis not present

## 2019-10-31 DIAGNOSIS — D649 Anemia, unspecified: Secondary | ICD-10-CM | POA: Insufficient documentation

## 2019-10-31 DIAGNOSIS — M6281 Muscle weakness (generalized): Secondary | ICD-10-CM | POA: Diagnosis not present

## 2019-10-31 DIAGNOSIS — M810 Age-related osteoporosis without current pathological fracture: Secondary | ICD-10-CM | POA: Diagnosis not present

## 2019-10-31 DIAGNOSIS — R296 Repeated falls: Secondary | ICD-10-CM | POA: Diagnosis not present

## 2019-10-31 DIAGNOSIS — Z9181 History of falling: Secondary | ICD-10-CM | POA: Diagnosis not present

## 2019-10-31 LAB — OCCULT BLOOD X 1 CARD TO LAB, STOOL
Fecal Occult Bld: POSITIVE — AB
Fecal Occult Bld: NEGATIVE

## 2019-11-05 ENCOUNTER — Other Ambulatory Visit (HOSPITAL_COMMUNITY)
Admission: RE | Admit: 2019-11-05 | Discharge: 2019-11-05 | Disposition: A | Discharge status: Home / Self Care | Payer: Medicare Other | Source: Skilled Nursing Facility | Attending: Adult Health | Admitting: Adult Health

## 2019-11-05 DIAGNOSIS — R710 Precipitous drop in hematocrit: Secondary | ICD-10-CM | POA: Insufficient documentation

## 2019-11-05 LAB — HEMOGLOBIN AND HEMATOCRIT, BLOOD
HCT: 31.8 % — ABNORMAL LOW (ref 36.0–46.0)
Hemoglobin: 9.8 g/dL — ABNORMAL LOW (ref 12.0–15.0)

## 2019-11-06 DIAGNOSIS — Z1159 Encounter for screening for other viral diseases: Secondary | ICD-10-CM | POA: Diagnosis not present

## 2019-11-06 DIAGNOSIS — Z5189 Encounter for other specified aftercare: Secondary | ICD-10-CM | POA: Diagnosis not present

## 2019-11-09 ENCOUNTER — Other Ambulatory Visit (HOSPITAL_COMMUNITY)
Admission: RE | Admit: 2019-11-09 | Discharge: 2019-11-09 | Disposition: A | Discharge status: Home / Self Care | Payer: Medicare Other | Source: Skilled Nursing Facility | Attending: Adult Health | Admitting: Adult Health

## 2019-11-09 DIAGNOSIS — D649 Anemia, unspecified: Secondary | ICD-10-CM | POA: Insufficient documentation

## 2019-11-09 DIAGNOSIS — R195 Other fecal abnormalities: Secondary | ICD-10-CM | POA: Diagnosis not present

## 2019-11-09 LAB — OCCULT BLOOD X 1 CARD TO LAB, STOOL: Fecal Occult Bld: POSITIVE — AB

## 2019-11-13 DIAGNOSIS — Z1159 Encounter for screening for other viral diseases: Secondary | ICD-10-CM | POA: Diagnosis not present

## 2019-11-13 DIAGNOSIS — Z5189 Encounter for other specified aftercare: Secondary | ICD-10-CM | POA: Diagnosis not present

## 2019-11-20 DIAGNOSIS — Z5189 Encounter for other specified aftercare: Secondary | ICD-10-CM | POA: Diagnosis not present

## 2019-11-20 DIAGNOSIS — Z1159 Encounter for screening for other viral diseases: Secondary | ICD-10-CM | POA: Diagnosis not present

## 2019-11-27 ENCOUNTER — Non-Acute Institutional Stay (SKILLED_NURSING_FACILITY): Payer: Medicare Other | Admitting: Adult Health

## 2019-11-27 ENCOUNTER — Encounter: Payer: Self-pay | Admitting: Adult Health

## 2019-11-27 DIAGNOSIS — D500 Iron deficiency anemia secondary to blood loss (chronic): Secondary | ICD-10-CM | POA: Diagnosis not present

## 2019-11-27 DIAGNOSIS — M810 Age-related osteoporosis without current pathological fracture: Secondary | ICD-10-CM | POA: Diagnosis not present

## 2019-11-27 DIAGNOSIS — F339 Major depressive disorder, recurrent, unspecified: Secondary | ICD-10-CM

## 2019-11-27 NOTE — Progress Notes (Signed)
Location:  [place] Room Number: 115-W Place of Service:  SNF (31)   CODE STATUS:  DNR  No Known Allergies  Chief Complaint  Patient presents with  . Medical Management of Chronic Issues        Anemia due to blood loss chronic:  Major depression recurrent chronic Age related osteoporosis without current pathological fracture     HPI:  She is a 86 year old long term resident of this facility being seen for the management of her chronic illnesses: anemia; depression osteoporosis. There are no reports of uncontrolled pain. No reports of changes in appetite; no reports of insomnia; or agitation.   Past Medical History:  Diagnosis Date  . Asthma   . Dementia ([place])   . Diabetes mellitus   . H. pylori infection MAR 2014  . Hypertension   . Stroke [facility])     Past Surgical History:  Procedure Laterality Date  . CHOLECYSTECTOMY    . COLECTOMY     secondary to diverticulitis  . ESOPHAGOGASTRODUODENOSCOPY (EGD) WITH ESOPHAGEAL DILATION N/A 01/07/2013   [id number] ring was found at the gastroesophageal junction/SINGLE DUODENAL DIVERTICULUM    Social History   Socioeconomic History  . Marital status: Widowed    Spouse name: Not on file  . Number of children: Not on file  . Years of education: Not on file  . Highest education level: Not on file  Occupational History  . Occupation: retired   Tobacco Use  . Smoking status: Never Smoker  . Smokeless tobacco: Never Used  Substance and Sexual Activity  . Alcohol use: No  . Drug use: No  . Sexual activity: Not Currently  Other Topics Concern  . Not on file  Social History Narrative   Is a long term resident of [facility]    Social Determinants of Health   Financial Resource Strain:   . Difficulty of Paying Living Expenses: Not on file  Food Insecurity:   . Worried About Charity fundraiser in the Last Year: Not on file  . Ran Out of Food in the Last Year: Not on file  Transportation Needs:   . Lack of  Transportation (Medical): Not on file  . Lack of Transportation (Non-Medical): Not on file  Physical Activity:   . Days of Exercise per Week: Not on file  . Minutes of Exercise per Session: Not on file  Stress: No Stress Concern Present  . Feeling of Stress : Not at all  Social Connections:   . Frequency of Communication with Friends and Family: Not on file  . Frequency of Social Gatherings with Friends and Family: Not on file  . Attends Religious Services: Not on file  . Active Member of Clubs or Organizations: Not on file  . Attends Archivist Meetings: Not on file  . Marital Status: Not on file  Intimate Partner Violence:   . Fear of Current or Ex-Partner: Not on file  . Emotionally Abused: Not on file  . Physically Abused: Not on file  . Sexually Abused: Not on file   Family History  Problem Relation Age of Onset  . Colon cancer Neg Hx       VITAL SIGNS BP (!) 118/43   Pulse 65   Temp 98.6 F (37 C) (Oral)   Resp 16   Ht 5\' 5"  (1.651 m)   Wt 119 lb 3.2 oz (54.1 kg)   SpO2 95%   BMI 19.84 kg/m   Outpatient Encounter  Medications as of 11/27/2019  Medication Sig  . Balsam [place]-Castor Oil (VENELEX) OINT Apply to sacrum and bilateral buttocks q shift & prn every shift  . Calcium Carbonate-Vitamin D (CALCIUM 500/VITAMIN D PO) Take 500 mg by mouth 2 (two) times daily.  . citalopram (CELEXA) 20 MG tablet Take 20 mg by mouth daily.   Marland Kitchen denosumab (PROLIA) 60 MG/ML SOLN injection Inject 60 mg into the skin every 6 (six) months. Administer in upper arm, thigh, or abdomen  . loratadine (CLARITIN) 10 MG tablet Take 10 mg by mouth daily.  . memantine (NAMENDA) 10 MG tablet Take 10 mg by mouth 2 (two) times daily.  . NON FORMULARY Diet Type:  Continue patient with Dysphagia 2 with honey thickened liquids.  Add ground meat to lunch and dinner trays - no sausage, no gravy on meat - gravy in bowls.  Continue HTL  . NON FORMULARY Take 1 each by mouth 3 (three) times daily  between meals. Magic Cup  . omeprazole (PRILOSEC) 40 MG capsule Take 40 mg by mouth 2 (two) times daily.   . ondansetron (ZOFRAN) 4 MG tablet Take 4 mg by mouth 3 (three) times daily before meals.    No facility-administered encounter medications on file as of 11/27/2019.     SIGNIFICANT DIAGNOSTIC EXAMS   LABS REVIEWED PREVIOUS   11-19-18; wbc 5.2; hgb 12.0; hct 38.4; mcv 100.5 plt 255; glucose 89; bun 13; creat 0.76; k+ 3.6; na++ 142; ca 8.3 hgb a1c 5.5 02-05-19: urine micro-albumin  126.8 03-25-19: wbc 7.7; hgb 14.2; hct 44.7; mcv 100.9; plt 263; glucose 118; bun 13; creat 0.86; k+ 4.7; an++ 142; ca 9.4; vit B 12: 361  06-12-19: wbc 8.1; hgb 13.4; hct 42.6; mcv 100;0 plt 278; 08-11-19: hgb a1c 6.1   TODAY;   10-29-19: wbc 6.5; hgb 10.2; hct 34.5; mcv 84.8 plt 322; glucose 112; bun 21; creat 0.89; k+ 3.8; na++ 143; ca 9.0 liver normal albumin 3.1 hgb a1c 6.4  10-31-19: guaiac: + and - 11-05-19: hgb 9.8; hct 31.8 11-09-19: guaiac +   Review of Systems  Unable to perform ROS: Dementia (unable to participate )   Physical Exam Constitutional:      General: She is not in acute distress.    Appearance: She is underweight. She is not diaphoretic.  Neck:     Thyroid: No thyromegaly.  Cardiovascular:     Rate and Rhythm: Normal rate and regular rhythm.     Pulses: Normal pulses.     Heart sounds: Normal heart sounds.  Pulmonary:     Effort: Pulmonary effort is normal. No respiratory distress.     Breath sounds: Normal breath sounds.  Abdominal:     General: Bowel sounds are normal. There is no distension.     Palpations: Abdomen is soft.     Tenderness: There is no abdominal tenderness.  Musculoskeletal:        General: Normal range of motion.     Cervical back: Neck supple.     Right lower leg: No edema.     Left lower leg: No edema.  Lymphadenopathy:     Cervical: No cervical adenopathy.  Skin:    General: Skin is warm and dry.  Neurological:     Mental Status: She is alert.  Mental status is at baseline.  Psychiatric:        Mood and Affect: Mood normal.      ASSESSMENT/ PLAN:  TODAY  1. Anemia due to blood loss chronic:  is without change hgb 9.8. unable to tolerate iron. Not a candidate for aggressive workup. Will continue to monitor has positive guaiac stools.   2. Major depression recurrent chronic is stable will continue 20 mg daily has failed one wean  3. Age related osteoporosis without current pathological fracture is stable will continue prolia 60 mg every 6 months with supplements.   PREVIOUS   4. Unspecified type dementia without behavioral disturbance: is without change weight is stable at 118 pounds will continue namenda 10 mg twice daily  5. Essential hypertension: is stable b/p 94/62 will continue to monitor her status.  6. Dysphagia oropharyngeal phase: no signs of aspiration; will continue honey thick liquids.   7. Chronic CVA: is stable will continue to monitor her status.   8. Chronic non-seasonal allergic rhinitis: is stable will continue claritin 10 mg daily   9. GERD without esophagitis: is stable will continue prilosec 40 mg twice daily and zofran 4 mg prior to meals.          MD is aware of resident's narcotic use and is in agreement with current plan of care. We will attempt to wean resident as appropriate.  Synthia Innocent NP [facility]  Contact [phone] Monday through Friday 8am- 5pm  After hours call [phone]

## 2019-11-28 DIAGNOSIS — Z1159 Encounter for screening for other viral diseases: Secondary | ICD-10-CM | POA: Diagnosis not present

## 2019-11-28 DIAGNOSIS — Z5189 Encounter for other specified aftercare: Secondary | ICD-10-CM | POA: Diagnosis not present

## 2019-12-01 DIAGNOSIS — D500 Iron deficiency anemia secondary to blood loss (chronic): Secondary | ICD-10-CM | POA: Insufficient documentation

## 2019-12-05 DIAGNOSIS — Z1159 Encounter for screening for other viral diseases: Secondary | ICD-10-CM | POA: Diagnosis not present

## 2019-12-05 DIAGNOSIS — Z5189 Encounter for other specified aftercare: Secondary | ICD-10-CM | POA: Diagnosis not present

## 2019-12-17 DIAGNOSIS — Z1159 Encounter for screening for other viral diseases: Secondary | ICD-10-CM | POA: Diagnosis not present

## 2019-12-17 DIAGNOSIS — Z5189 Encounter for other specified aftercare: Secondary | ICD-10-CM | POA: Diagnosis not present

## 2019-12-24 ENCOUNTER — Non-Acute Institutional Stay (SKILLED_NURSING_FACILITY): Payer: Medicare Other | Admitting: Internal Medicine

## 2019-12-24 ENCOUNTER — Encounter: Payer: Self-pay | Admitting: Internal Medicine

## 2019-12-24 DIAGNOSIS — M810 Age-related osteoporosis without current pathological fracture: Secondary | ICD-10-CM | POA: Diagnosis not present

## 2019-12-24 DIAGNOSIS — F339 Major depressive disorder, recurrent, unspecified: Secondary | ICD-10-CM | POA: Diagnosis not present

## 2019-12-24 DIAGNOSIS — J3089 Other allergic rhinitis: Secondary | ICD-10-CM

## 2019-12-24 NOTE — Progress Notes (Signed)
Location:  [facility] Room Number: 115-W Place of Service:  SNF (31)  Margit Hanks, MD  Patient Care Team: Margit Hanks, MD as PCP - General (Internal Medicine) Center, [facility]) Chilton Si, Chong Sicilian, NP as Nurse Practitioner (Geriatric Medicine)  Extended Emergency Contact Information Primary Emergency Contact: [name] [place] Home Phone: [phone] Relation: Son Secondary Emergency Contact: [name] [place] Home Phone: [phone] Mobile Phone: [phone] Relation: Son    Allergies: Patient has no known allergies.  Chief Complaint  Patient presents with  . Medical Management of Chronic Issues    Routine [facility] visit    HPI: Patient is 86 y.o. female who is being seen for routine issues of allergic rhinitis, depression, and osteoporosis.  Past Medical History:  Diagnosis Date  . Asthma   . Dementia (HCC)   . Diabetes mellitus   . H. pylori infection MAR 2014  . Hypertension   . Stroke [facility])     Past Surgical History:  Procedure Laterality Date  . CHOLECYSTECTOMY    . COLECTOMY     secondary to diverticulitis  . ESOPHAGOGASTRODUODENOSCOPY (EGD) WITH ESOPHAGEAL DILATION N/A 01/07/2013   [id number] ring was found at the gastroesophageal junction/SINGLE DUODENAL DIVERTICULUM    Allergies as of 12/24/2019   No Known Allergies     Medication List    Notice   This visit is during an admission. Changes to the med list made in this visit will be reflected in the After Visit Summary of the admission.    Current Outpatient Medications on File Prior to Visit  Medication Sig Dispense Refill  . Balsam [place]-Castor Oil (VENELEX) OINT Apply to sacrum and bilateral buttocks q shift & prn every shift    . Calcium Carbonate-Vitamin D (CALCIUM 500/VITAMIN D PO) Take 500 mg by mouth 2 (two) times daily.    . citalopram (CELEXA) 20 MG tablet Take 20 mg  by mouth daily.     Marland Kitchen denosumab (PROLIA) 60 MG/ML SOLN injection Inject 60 mg into the skin every 6 (six) months. Administer in upper arm, thigh, or abdomen    . guaiFENesin-dextromethorphan (ROBITUSSIN DM) 100-10 MG/5ML syrup Take 15 mLs by mouth every 6 (six) hours as needed for cough.    . loratadine (CLARITIN) 10 MG tablet Take 10 mg by mouth daily.    . memantine (NAMENDA) 10 MG tablet Take 10 mg by mouth 2 (two) times daily.    . NON FORMULARY Diet Type:  Continue patient with Dysphagia 2 with honey thickened liquids.  Add ground meat to lunch and dinner trays - no sausage, no gravy on meat - gravy in bowls.  Continue HTL    . NON FORMULARY Take 1 each by mouth 3 (three) times daily between meals. Magic Cup    . omeprazole (PRILOSEC) 40 MG capsule Take 40 mg by mouth 2 (two) times daily.     . ondansetron (ZOFRAN) 4 MG tablet Take 4 mg by mouth 3 (three) times daily before meals.      No current facility-administered medications on file prior to visit.     No orders of the defined types were placed in this encounter.   Immunization History  Administered Date(s) Administered  . Influenza, High Dose Seasonal PF July 06, 2018  . Influenza-Unspecified 07/20/2017, 07/18/2018, 07/21/2019  . Pneumococcal Conjugate-13 06/12/2017  . Pneumococcal-Unspecified 07/24/2016  . Tdap 07/12/2017    Social History   Tobacco Use  .  Smoking status: Never Smoker  . Smokeless tobacco: Never Used  Substance Use Topics  . Alcohol use: No    Review of Systems    unable to obtain secondary to dementia     Vitals:   12/24/19 1526  BP: (!) 118/43  Pulse: 65  Resp: 16  Temp: 98.1 F (36.7 C)  SpO2: 95%   Body mass index is 19.5 kg/m. Physical Exam  GENERAL APPEARANCE: Alert, No acute distress  SKIN: No diaphoresis rash HEENT: Unremarkable RESPIRATORY: Breathing is even, unlabored. Lung sounds are clear   CARDIOVASCULAR: Heart RRR no murmurs, rubs or gallops. No peripheral edema   GASTROINTESTINAL: Abdomen is soft, non-tender, not distended w/ normal bowel sounds.  GENITOURINARY: Bladder non tender, not distended  MUSCULOSKELETAL: No abnormal joints or musculature NEUROLOGIC: Cranial nerves 2-12 grossly intact. Moves all extremities PSYCHIATRIC: Mood and affect with dementia no behavioral issues  Patient Active Problem List   Diagnosis Date Noted  . Anemia due to blood loss, chronic 12/01/2019  . Chronic cerebrovascular accident (CVA) 04/26/2019  . Dysphagia, oropharyngeal phase 12/19/2018  . Chronic non-seasonal allergic rhinitis 12/19/2018  . GERD without esophagitis 12/19/2018  . Major depression, recurrent, chronic ([place]) 12/19/2018  . Osteoporosis 11/21/2016  . Hypertension 09/26/2016  . Esophageal dysmotility 07/25/2016  . Dementia ([place]) 07/06/2016    CMP     Component Value Date/Time   NA 143 10/29/2019 0420   K 3.8 10/29/2019 0420   CL 105 10/29/2019 0420   CO2 27 10/29/2019 0420   GLUCOSE 112 (H) 10/29/2019 0420   BUN 21 10/29/2019 0420   CREATININE 0.98 10/29/2019 0420   CALCIUM 9.0 10/29/2019 0420   PROT 6.5 10/29/2019 0420   ALBUMIN 3.1 (L) 10/29/2019 0420   AST 15 10/29/2019 0420   ALT 11 10/29/2019 0420   ALKPHOS 60 10/29/2019 0420   BILITOT 0.5 10/29/2019 0420   GFRNONAA 53 (L) 10/29/2019 0420   GFRAA >60 10/29/2019 0420   Recent Labs    03/25/19 0724 10/29/19 0420  NA 142 143  K 4.7 3.8  CL 102 105  CO2 29 27  GLUCOSE 118* 112*  BUN 13 21  CREATININE 0.86 0.98  CALCIUM 9.4 9.0   Recent Labs    10/29/19 0420  AST 15  ALT 11  ALKPHOS 60  BILITOT 0.5  PROT 6.5  ALBUMIN 3.1*   Recent Labs    03/25/19 0724 03/25/19 0724 06/12/19 1100 10/29/19 0420 11/05/19 0600  WBC 7.7  --  8.1 6.5  --   NEUTROABS  --   --  5.1  --   --   HGB 14.2   < > 13.4 10.2* 9.8*  HCT 44.7   < > 42.6 34.5* 31.8*  MCV 100.9*  --  100.0 84.8  --   PLT 263  --  278 322  --    < > = values in this interval not displayed.   No results  for input(s): CHOL, LDLCALC, TRIG in the last 8760 hours.  Invalid input(s): HCL Lab Results  Component Value Date   MICROALBUR 126.8 (H) 02/05/2019   Lab Results  Component Value Date   TSH 1.030 09/11/2017   Lab Results  Component Value Date   HGBA1C 6.4 (H) 10/29/2019   No results found for: CHOL, HDL, LDLCALC, LDLDIRECT, TRIG, CHOLHDL  Significant Diagnostic Results in last 30 days:  No results found.  Assessment and Plan  Chronic non-seasonal allergic rhinitis Chronic and stable; continue Claritin 10 mg daily  Major depression, recurrent, chronic (HCC) Chronic and stable; continue Celexa 20 mg daily  Osteoporosis No recent fracture; continue Prolia 60 mg subcu every 6 months    Margit Hanks, MD

## 2019-12-25 DIAGNOSIS — Z1159 Encounter for screening for other viral diseases: Secondary | ICD-10-CM | POA: Diagnosis not present

## 2019-12-25 DIAGNOSIS — Z5189 Encounter for other specified aftercare: Secondary | ICD-10-CM | POA: Diagnosis not present

## 2019-12-28 ENCOUNTER — Encounter: Payer: Self-pay | Admitting: Internal Medicine

## 2019-12-28 NOTE — Assessment & Plan Note (Signed)
Chronic and stable; continue Claritin 10 mg daily 

## 2019-12-28 NOTE — Assessment & Plan Note (Signed)
No recent fracture; continue Prolia 60 mg subcu every 6 months

## 2019-12-28 NOTE — Assessment & Plan Note (Signed)
Chronic and stable; continue Celexa 20 mg daily 

## 2019-12-31 DIAGNOSIS — Z5189 Encounter for other specified aftercare: Secondary | ICD-10-CM | POA: Diagnosis not present

## 2019-12-31 DIAGNOSIS — Z1159 Encounter for screening for other viral diseases: Secondary | ICD-10-CM | POA: Diagnosis not present

## 2020-01-13 DIAGNOSIS — I639 Cerebral infarction, unspecified: Secondary | ICD-10-CM | POA: Diagnosis not present

## 2020-01-13 DIAGNOSIS — R279 Unspecified lack of coordination: Secondary | ICD-10-CM | POA: Diagnosis not present

## 2020-01-13 DIAGNOSIS — M6281 Muscle weakness (generalized): Secondary | ICD-10-CM | POA: Diagnosis not present

## 2020-01-13 DIAGNOSIS — M810 Age-related osteoporosis without current pathological fracture: Secondary | ICD-10-CM | POA: Diagnosis not present

## 2020-01-14 DIAGNOSIS — M810 Age-related osteoporosis without current pathological fracture: Secondary | ICD-10-CM | POA: Diagnosis not present

## 2020-01-14 DIAGNOSIS — M6281 Muscle weakness (generalized): Secondary | ICD-10-CM | POA: Diagnosis not present

## 2020-01-14 DIAGNOSIS — R279 Unspecified lack of coordination: Secondary | ICD-10-CM | POA: Diagnosis not present

## 2020-01-14 DIAGNOSIS — I639 Cerebral infarction, unspecified: Secondary | ICD-10-CM | POA: Diagnosis not present

## 2020-01-15 DIAGNOSIS — R279 Unspecified lack of coordination: Secondary | ICD-10-CM | POA: Diagnosis not present

## 2020-01-15 DIAGNOSIS — R1312 Dysphagia, oropharyngeal phase: Secondary | ICD-10-CM | POA: Diagnosis not present

## 2020-01-15 DIAGNOSIS — I639 Cerebral infarction, unspecified: Secondary | ICD-10-CM | POA: Diagnosis not present

## 2020-01-15 DIAGNOSIS — M6281 Muscle weakness (generalized): Secondary | ICD-10-CM | POA: Diagnosis not present

## 2020-01-15 DIAGNOSIS — M810 Age-related osteoporosis without current pathological fracture: Secondary | ICD-10-CM | POA: Diagnosis not present

## 2020-01-16 DIAGNOSIS — M810 Age-related osteoporosis without current pathological fracture: Secondary | ICD-10-CM | POA: Diagnosis not present

## 2020-01-16 DIAGNOSIS — R279 Unspecified lack of coordination: Secondary | ICD-10-CM | POA: Diagnosis not present

## 2020-01-16 DIAGNOSIS — I639 Cerebral infarction, unspecified: Secondary | ICD-10-CM | POA: Diagnosis not present

## 2020-01-16 DIAGNOSIS — R1312 Dysphagia, oropharyngeal phase: Secondary | ICD-10-CM | POA: Diagnosis not present

## 2020-01-16 DIAGNOSIS — M6281 Muscle weakness (generalized): Secondary | ICD-10-CM | POA: Diagnosis not present

## 2020-01-19 DIAGNOSIS — I639 Cerebral infarction, unspecified: Secondary | ICD-10-CM | POA: Diagnosis not present

## 2020-01-19 DIAGNOSIS — R279 Unspecified lack of coordination: Secondary | ICD-10-CM | POA: Diagnosis not present

## 2020-01-19 DIAGNOSIS — M6281 Muscle weakness (generalized): Secondary | ICD-10-CM | POA: Diagnosis not present

## 2020-01-19 DIAGNOSIS — M810 Age-related osteoporosis without current pathological fracture: Secondary | ICD-10-CM | POA: Diagnosis not present

## 2020-01-19 DIAGNOSIS — R1312 Dysphagia, oropharyngeal phase: Secondary | ICD-10-CM | POA: Diagnosis not present

## 2020-01-20 DIAGNOSIS — Z1159 Encounter for screening for other viral diseases: Secondary | ICD-10-CM | POA: Diagnosis not present

## 2020-01-21 DIAGNOSIS — M810 Age-related osteoporosis without current pathological fracture: Secondary | ICD-10-CM | POA: Diagnosis not present

## 2020-01-21 DIAGNOSIS — M6281 Muscle weakness (generalized): Secondary | ICD-10-CM | POA: Diagnosis not present

## 2020-01-21 DIAGNOSIS — R279 Unspecified lack of coordination: Secondary | ICD-10-CM | POA: Diagnosis not present

## 2020-01-21 DIAGNOSIS — R1312 Dysphagia, oropharyngeal phase: Secondary | ICD-10-CM | POA: Diagnosis not present

## 2020-01-21 DIAGNOSIS — I639 Cerebral infarction, unspecified: Secondary | ICD-10-CM | POA: Diagnosis not present

## 2020-01-22 DIAGNOSIS — M6281 Muscle weakness (generalized): Secondary | ICD-10-CM | POA: Diagnosis not present

## 2020-01-22 DIAGNOSIS — R279 Unspecified lack of coordination: Secondary | ICD-10-CM | POA: Diagnosis not present

## 2020-01-22 DIAGNOSIS — I639 Cerebral infarction, unspecified: Secondary | ICD-10-CM | POA: Diagnosis not present

## 2020-01-22 DIAGNOSIS — M810 Age-related osteoporosis without current pathological fracture: Secondary | ICD-10-CM | POA: Diagnosis not present

## 2020-01-22 DIAGNOSIS — R1312 Dysphagia, oropharyngeal phase: Secondary | ICD-10-CM | POA: Diagnosis not present

## 2020-01-23 DIAGNOSIS — M810 Age-related osteoporosis without current pathological fracture: Secondary | ICD-10-CM | POA: Diagnosis not present

## 2020-01-23 DIAGNOSIS — R1312 Dysphagia, oropharyngeal phase: Secondary | ICD-10-CM | POA: Diagnosis not present

## 2020-01-23 DIAGNOSIS — I639 Cerebral infarction, unspecified: Secondary | ICD-10-CM | POA: Diagnosis not present

## 2020-01-23 DIAGNOSIS — M6281 Muscle weakness (generalized): Secondary | ICD-10-CM | POA: Diagnosis not present

## 2020-01-23 DIAGNOSIS — R279 Unspecified lack of coordination: Secondary | ICD-10-CM | POA: Diagnosis not present

## 2020-01-26 DIAGNOSIS — I639 Cerebral infarction, unspecified: Secondary | ICD-10-CM | POA: Diagnosis not present

## 2020-01-26 DIAGNOSIS — M6281 Muscle weakness (generalized): Secondary | ICD-10-CM | POA: Diagnosis not present

## 2020-01-26 DIAGNOSIS — M810 Age-related osteoporosis without current pathological fracture: Secondary | ICD-10-CM | POA: Diagnosis not present

## 2020-01-26 DIAGNOSIS — R1312 Dysphagia, oropharyngeal phase: Secondary | ICD-10-CM | POA: Diagnosis not present

## 2020-01-26 DIAGNOSIS — R279 Unspecified lack of coordination: Secondary | ICD-10-CM | POA: Diagnosis not present

## 2020-01-27 ENCOUNTER — Non-Acute Institutional Stay (SKILLED_NURSING_FACILITY): Payer: Medicare Other | Admitting: Adult Health

## 2020-01-27 ENCOUNTER — Encounter: Payer: Self-pay | Admitting: Adult Health

## 2020-01-27 DIAGNOSIS — I639 Cerebral infarction, unspecified: Secondary | ICD-10-CM | POA: Diagnosis not present

## 2020-01-27 DIAGNOSIS — M810 Age-related osteoporosis without current pathological fracture: Secondary | ICD-10-CM | POA: Diagnosis not present

## 2020-01-27 DIAGNOSIS — F0390 Unspecified dementia without behavioral disturbance: Secondary | ICD-10-CM

## 2020-01-27 DIAGNOSIS — R1312 Dysphagia, oropharyngeal phase: Secondary | ICD-10-CM | POA: Diagnosis not present

## 2020-01-27 DIAGNOSIS — I10 Essential (primary) hypertension: Secondary | ICD-10-CM | POA: Diagnosis not present

## 2020-01-27 DIAGNOSIS — M6281 Muscle weakness (generalized): Secondary | ICD-10-CM | POA: Diagnosis not present

## 2020-01-27 DIAGNOSIS — R279 Unspecified lack of coordination: Secondary | ICD-10-CM | POA: Diagnosis not present

## 2020-01-27 NOTE — Progress Notes (Signed)
Location:    [facility] Room Number: 115/W Place of Service:  SNF (31)   CODE STATUS: DNR  No Known Allergies  Chief Complaint  Patient presents with  . Medical Management of Chronic Issues         Unspecified type dementia without behavioral disturbance:    [place] hypertension:  Dysphagia oropharyngeal phase     HPI:  She is a 86 year old long term resident of this facility being seen for the management of her chronic illnesses: dementia; hypertension; dysphagia. There are no reports of aspiration; no reports of uncontrolled pain; no reports of agitation or anxiety.    Past Medical History:  Diagnosis Date  . Asthma   . Dementia (HCC)   . Diabetes mellitus   . H. pylori infection MAR 2014  . Hypertension   . Stroke [facility])     Past Surgical History:  Procedure Laterality Date  . CHOLECYSTECTOMY    . COLECTOMY     secondary to diverticulitis  . ESOPHAGOGASTRODUODENOSCOPY (EGD) WITH ESOPHAGEAL DILATION N/A 01/07/2013   [id number] ring was found at the gastroesophageal junction/SINGLE DUODENAL DIVERTICULUM    Social History   Socioeconomic History  . Marital status: Widowed    Spouse name: Not on file  . Number of children: Not on file  . Years of education: Not on file  . Highest education level: Not on file  Occupational History  . Occupation: retired   Tobacco Use  . Smoking status: Never Smoker  . Smokeless tobacco: Never Used  Substance and Sexual Activity  . Alcohol use: No  . Drug use: No  . Sexual activity: Not Currently  Other Topics Concern  . Not on file  Social History Narrative   Is a long term resident of [facility]    Social Determinants of Health   Financial Resource Strain:   . Difficulty of Paying Living Expenses:   Food Insecurity:   . Worried About Programme researcher, broadcasting/film/video in the Last Year:   . Barista in the Last Year:   Transportation Needs:   . Freight forwarder (Medical):   Marland Kitchen Lack of Transportation  (Non-Medical):   Physical Activity:   . Days of Exercise per Week:   . Minutes of Exercise per Session:   Stress: No Stress Concern Present  . Feeling of Stress : Not at all  Social Connections:   . Frequency of Communication with Friends and Family:   . Frequency of Social Gatherings with Friends and Family:   . Attends Religious Services:   . Active Member of Clubs or Organizations:   . Attends Banker Meetings:   Marland Kitchen Marital Status:   Intimate Partner Violence:   . Fear of Current or Ex-Partner:   . Emotionally Abused:   Marland Kitchen Physically Abused:   . Sexually Abused:    Family History  Problem Relation Age of Onset  . Hypertension Mother   . Colon cancer Neg Hx       VITAL SIGNS BP 128/79   Pulse 78   Temp 98.2 F (36.8 C) (Oral)   Resp 18   Ht 5\' 5"  (1.651 m)   Wt 115 lb 9.6 oz (52.4 kg)   SpO2 95%   BMI 19.24 kg/m   Outpatient Encounter Medications as of 01/27/2020  Medication Sig  . Balsam [place]-Castor Oil (VENELEX) OINT Apply to sacrum and bilateral buttocks q shift & prn every shift  . Calcium Carbonate-Vitamin D (CALCIUM  500/VITAMIN D PO) Take 500 mg by mouth 2 (two) times daily.  . citalopram (CELEXA) 10 MG tablet Take 10 mg by mouth daily.  Marland Kitchen denosumab (PROLIA) 60 MG/ML SOLN injection Inject 60 mg into the skin every 6 (six) months. Administer in upper arm, thigh, or abdomen  . guaiFENesin-dextromethorphan (ROBITUSSIN DM) 100-10 MG/5ML syrup Take 15 mLs by mouth every 6 (six) hours as needed for cough.  . loratadine (CLARITIN) 10 MG tablet Take 10 mg by mouth daily.  . memantine (NAMENDA) 10 MG tablet Take 10 mg by mouth 2 (two) times daily.  . NON FORMULARY Diet Type:  Continue patient with Dysphagia 2 with honey thickened liquids.  Add ground meat to lunch and dinner trays - no sausage, no gravy on meat - gravy in bowls.  Continue HTL  . NON FORMULARY Take 1 each by mouth 3 (three) times daily between meals. Magic Cup  . omeprazole (PRILOSEC) 40 MG  capsule Take 40 mg by mouth 2 (two) times daily.   . ondansetron (ZOFRAN) 4 MG tablet Take 4 mg by mouth 3 (three) times daily before meals.   . [DISCONTINUED] citalopram (CELEXA) 20 MG tablet Take 10 mg by mouth daily.    No facility-administered encounter medications on file as of 01/27/2020.     SIGNIFICANT DIAGNOSTIC EXAMS  LABS REVIEWED PREVIOUS   02-05-19: urine micro-albumin  126.8 03-25-19: wbc 7.7; hgb 14.2; hct 44.7; mcv 100.9; plt 263; glucose 118; bun 13; creat 0.86; k+ 4.7; an++ 142; ca 9.4; vit B 12: 361  06-12-19: wbc 8.1; hgb 13.4; hct 42.6; mcv 100;0 plt 278; 08-11-19: hgb a1c 6.1  10-29-19: wbc 6.5; hgb 10.2; hct 34.5; mcv 84.8 plt 322; glucose 112; bun 21; creat 0.89; k+ 3.8; na++ 143; ca 9.0 liver normal albumin 3.1 hgb a1c 6.4  10-31-19: guaiac: + and - 11-05-19: hgb 9.8; hct 31.8 11-09-19: guaiac +   NO NEW LABS.   Review of Systems  Unable to perform ROS: Dementia (unable to participate )   Physical Exam Constitutional:      General: She is not in acute distress.    Appearance: She is underweight. She is not diaphoretic.  Neck:     Thyroid: No thyromegaly.  Cardiovascular:     Rate and Rhythm: Normal rate and regular rhythm.     Pulses: Normal pulses.     Heart sounds: Normal heart sounds.  Pulmonary:     Effort: Pulmonary effort is normal. No respiratory distress.     Breath sounds: Normal breath sounds.  Abdominal:     General: Bowel sounds are normal. There is no distension.     Palpations: Abdomen is soft.     Tenderness: There is no abdominal tenderness.  Musculoskeletal:        General: Normal range of motion.     Cervical back: Neck supple.     Right lower leg: No edema.     Left lower leg: No edema.  Lymphadenopathy:     Cervical: No cervical adenopathy.  Skin:    General: Skin is warm and dry.  Neurological:     Mental Status: She is alert. Mental status is at baseline.  Psychiatric:        Mood and Affect: Mood normal.     ASSESSMENT/  PLAN:  TODAY  1. Unspecified type dementia without behavioral disturbance: is without change weight is 115 pounds; will continue namenda 10 mg twice daily   2. [place] hypertension: is stable 128/79 will continue  to monitor her status.   3. Dysphagia oropharyngeal phase no signs of aspiration: will continue honey thick liquids.   PREVIOUS   4. Chronic CVA: is stable will continue to monitor her status.   5. Chronic non-seasonal allergic rhinitis: is stable will continue claritin 10 mg daily   6. GERD without esophagitis: is stable will continue prilosec 40 mg twice daily and zofran 4 mg prior to meals.   7. Anemia due to blood loss chronic: is without change hgb 9.8. unable to tolerate iron. Not a candidate for aggressive workup. Will continue to monitor has positive guaiac stools.   8. Major depression recurrent chronic is stable will continue celexa 20 mg daily has failed one wean  9. Age related osteoporosis without current pathological fracture is stable will continue prolia 60 mg every 6 months with supplements.     MD is aware of resident's narcotic use and is in agreement with current plan of care. We will attempt to wean resident as appropriate.  Ok Edwards NP [facility]  Contact [phone] Monday through Friday 8am- 5pm  After hours call [phone]

## 2020-01-28 DIAGNOSIS — I639 Cerebral infarction, unspecified: Secondary | ICD-10-CM | POA: Diagnosis not present

## 2020-01-28 DIAGNOSIS — M810 Age-related osteoporosis without current pathological fracture: Secondary | ICD-10-CM | POA: Diagnosis not present

## 2020-01-28 DIAGNOSIS — R279 Unspecified lack of coordination: Secondary | ICD-10-CM | POA: Diagnosis not present

## 2020-01-28 DIAGNOSIS — M6281 Muscle weakness (generalized): Secondary | ICD-10-CM | POA: Diagnosis not present

## 2020-01-28 DIAGNOSIS — R1312 Dysphagia, oropharyngeal phase: Secondary | ICD-10-CM | POA: Diagnosis not present

## 2020-01-29 DIAGNOSIS — M6281 Muscle weakness (generalized): Secondary | ICD-10-CM | POA: Diagnosis not present

## 2020-01-29 DIAGNOSIS — R279 Unspecified lack of coordination: Secondary | ICD-10-CM | POA: Diagnosis not present

## 2020-01-29 DIAGNOSIS — R1312 Dysphagia, oropharyngeal phase: Secondary | ICD-10-CM | POA: Diagnosis not present

## 2020-01-29 DIAGNOSIS — M810 Age-related osteoporosis without current pathological fracture: Secondary | ICD-10-CM | POA: Diagnosis not present

## 2020-01-29 DIAGNOSIS — I639 Cerebral infarction, unspecified: Secondary | ICD-10-CM | POA: Diagnosis not present

## 2020-01-30 ENCOUNTER — Non-Acute Institutional Stay (SKILLED_NURSING_FACILITY): Payer: Medicare Other | Admitting: Adult Health

## 2020-01-30 ENCOUNTER — Encounter: Payer: Self-pay | Admitting: Adult Health

## 2020-01-30 DIAGNOSIS — R279 Unspecified lack of coordination: Secondary | ICD-10-CM | POA: Diagnosis not present

## 2020-01-30 DIAGNOSIS — I639 Cerebral infarction, unspecified: Secondary | ICD-10-CM | POA: Diagnosis not present

## 2020-01-30 DIAGNOSIS — M6281 Muscle weakness (generalized): Secondary | ICD-10-CM | POA: Diagnosis not present

## 2020-01-30 DIAGNOSIS — R1312 Dysphagia, oropharyngeal phase: Secondary | ICD-10-CM | POA: Diagnosis not present

## 2020-01-30 DIAGNOSIS — Z0000 Encounter for general adult medical examination without abnormal findings: Secondary | ICD-10-CM

## 2020-01-30 DIAGNOSIS — M810 Age-related osteoporosis without current pathological fracture: Secondary | ICD-10-CM | POA: Diagnosis not present

## 2020-01-30 NOTE — Progress Notes (Signed)
Subjective:   JARETSSI KRAKER is a 86 y.o. female who presents for Medicare Annual (Subsequent) preventive examination.long term resident of [facility]   Review of Systems:  Review of Systems  Unable to perform ROS: Dementia (unable to participate )    Cardiac Risk Factors include: advanced age (>38men, >24 women);sedentary lifestyle;hypertension     Objective:     Vitals: BP 128/79   Pulse 78   Temp (!) 96.5 F (35.8 C) (Oral)   Resp 18   Ht 5\' 5"  (1.651 m)   Wt 115 lb 9.6 oz (52.4 kg)   SpO2 95%   BMI 19.24 kg/m   Body mass index is 19.24 kg/m.  Advanced Directives 01/30/2020 12/26/2019 10/27/2019 10/16/2019 10/08/2019 09/23/2019 09/10/2019  Does Patient Have a Medical Advance Directive? Yes Yes Yes Yes Yes Yes Yes  Type of Advance Directive Out of facility DNR (pink MOST or yellow form) Out of facility DNR (pink MOST or yellow form) Out of facility DNR (pink MOST or yellow form) Out of facility DNR (pink MOST or yellow form) Out of facility DNR (pink MOST or yellow form) Out of facility DNR (pink MOST or yellow form) Out of facility DNR (pink MOST or yellow form)  Does patient want to make changes to medical advance directive? No - Patient declined No - Patient declined No - Patient declined No - Patient declined No - Patient declined No - Patient declined No - Patient declined  Copy of [place] in Chart? - No - copy requested - - - - -  Would patient like information on creating a medical advance directive? - - - - - - -  Pre-existing out of facility DNR order (yellow form or pink MOST form) - Yellow form placed in chart (order not valid for inpatient use) Yellow form placed in chart (order not valid for inpatient use) Yellow form placed in chart (order not valid for inpatient use) Yellow form placed in chart (order not valid for inpatient use) Yellow form placed in chart (order not valid for inpatient use) -    Tobacco Social History   Tobacco Use  Smoking  Status Never Smoker  Smokeless Tobacco Never Used     Counseling given: Not Answered   Clinical Intake:  Pre-visit preparation completed: Yes  Pain : No/denies pain     BMI - recorded: 19.24 Nutritional Status: BMI of 19-24  Normal Diabetes: No     Interpreter Needed?: No     Past Medical History:  Diagnosis Date  . Asthma   . Dementia ([place])   . Diabetes mellitus   . H. pylori infection MAR 2014  . Hypertension   . Stroke [facility])    Past Surgical History:  Procedure Laterality Date  . CHOLECYSTECTOMY    . COLECTOMY     secondary to diverticulitis  . ESOPHAGOGASTRODUODENOSCOPY (EGD) WITH ESOPHAGEAL DILATION N/A 01/07/2013   [id number] ring was found at the gastroesophageal junction/SINGLE DUODENAL DIVERTICULUM   Family History  Problem Relation Age of Onset  . Hypertension Mother   . Colon cancer Neg Hx    Social History   Socioeconomic History  . Marital status: Widowed    Spouse name: Not on file  . Number of children: Not on file  . Years of education: Not on file  . Highest education level: Not on file  Occupational History  . Occupation: retired   Tobacco Use  . Smoking status: Never Smoker  . Smokeless tobacco: Never Used  Substance and Sexual Activity  . Alcohol use: No  . Drug use: No  . Sexual activity: Not Currently  Other Topics Concern  . Not on file  Social History Narrative   Is a long term resident of [facility]    Social Determinants of Health   Financial Resource Strain:   . Difficulty of Paying Living Expenses:   Food Insecurity:   . Worried About Programme researcher, broadcasting/film/video in the Last Year:   . Barista in the Last Year:   Transportation Needs:   . Freight forwarder (Medical):   Marland Kitchen Lack of Transportation (Non-Medical):   Physical Activity:   . Days of Exercise per Week:   . Minutes of Exercise per Session:   Stress: No Stress Concern Present  . Feeling of Stress : Not at all  Social Connections:   . Frequency of  Communication with Friends and Family:   . Frequency of Social Gatherings with Friends and Family:   . Attends Religious Services:   . Active Member of Clubs or Organizations:   . Attends Banker Meetings:   Marland Kitchen Marital Status:     Outpatient Encounter Medications as of 01/30/2020  Medication Sig  . Balsam [place]-Castor Oil (VENELEX) OINT Apply to sacrum and bilateral buttocks q shift & prn every shift  . Calcium Carbonate-Vitamin D (CALCIUM 500/VITAMIN D PO) Take 500 mg by mouth 2 (two) times daily.  . citalopram (CELEXA) 10 MG tablet Take 10 mg by mouth daily.  Marland Kitchen denosumab (PROLIA) 60 MG/ML SOLN injection Inject 60 mg into the skin every 6 (six) months. Administer in upper arm, thigh, or abdomen  . guaiFENesin-dextromethorphan (ROBITUSSIN DM) 100-10 MG/5ML syrup Take 15 mLs by mouth every 6 (six) hours as needed for cough.  . loratadine (CLARITIN) 10 MG tablet Take 10 mg by mouth daily.  . memantine (NAMENDA) 10 MG tablet Take 10 mg by mouth 2 (two) times daily.  . NON FORMULARY Diet Type:  Continue patient with Dysphagia 2 with honey thickened liquids.  Add ground meat to lunch and dinner trays - no sausage, no gravy on meat - gravy in bowls.  Continue HTL  . NON FORMULARY Take 1 each by mouth 3 (three) times daily between meals. Magic Cup  . omeprazole (PRILOSEC) 40 MG capsule Take 40 mg by mouth 2 (two) times daily.   . ondansetron (ZOFRAN) 4 MG tablet Take 4 mg by mouth 3 (three) times daily before meals.    No facility-administered encounter medications on file as of 01/30/2020.    Activities of Daily Living In your present state of health, do you have any difficulty performing the following activities: 01/30/2020 09/10/2019  Hearing? N N  Vision? N N  Difficulty concentrating or making decisions? [name]  Walking or climbing stairs? Y Y  Dressing or bathing? Y Y  Doing errands, shopping? Y -  Quarry manager and eating ? Y -  Using the Toilet? Y -  In the past six months,  have you accidently leaked urine? Y -  Do you have problems with loss of bowel control? Y -  Managing your Medications? Y -  Managing your Finances? Y -  Housekeeping or managing your Housekeeping? Y -  Some recent data might be hidden    Patient Care Team: Margit Hanks, MD as PCP - General (Internal Medicine) [facility] Chilton Si Chong Sicilian, NP as Nurse Practitioner (Geriatric Medicine)    Assessment:  This is a routine wellness examination for Amonie.  Exercise Activities and Dietary recommendations Current Exercise Habits: The patient does not participate in regular exercise at present  Goals    . DIET - INCREASE WATER INTAKE    . Follow up with Primary Care Provider    . General - Client will not be readmitted within 30 days ([facility])       Fall Risk Fall Risk  01/30/2020 09/10/2019 04/24/2019 07/23/2018 07/12/2017  Falls in the past year? 1 1 0 No No  Number falls in past yr: 0 0 - - -  Injury with Fall? 0 0 - - -  Risk for fall due to : History of fall(s);Impaired balance/gait;Impaired mobility History of fall(s);Impaired balance/gait;Impaired mobility Impaired mobility - -  Follow up Falls evaluation completed - - - -   Is the patient's home free of loose throw rugs in walkways, pet beds, electrical cords, etc?   yes      Grab bars in the bathroom? Yes       Handrails on the stairs?   n/a      Adequate lighting?   Yes   Timed Get Up and Go performed: unable to perform   Depression Screen PHQ 2/9 Scores 01/30/2020 09/10/2019 04/24/2019 07/23/2018  PHQ - 2 Score - 0 0 0  PHQ- 9 Score - - - -  Exception Documentation Other- indicate reason in comment box - - -  Not completed patient unable to participate - - -     Cognitive Function     6CIT Screen 01/30/2020 07/23/2018 07/12/2017  What Year? 4 points 0 points 4 points  What month? 3 points 0 points 3 points  What time? 3 points 0 points 0 points  Count back from 20 4 points 0  points 0 points  Months in reverse 4 points 4 points 0 points  Repeat phrase 2 points 6 points 10 points  Total Score 20 10 17     Immunization History  Administered Date(s) Administered  . Influenza, High Dose Seasonal PF 07/10/2018  . Influenza-Unspecified 07/20/2017, 07/18/2018, 07/21/2019  . Moderna SARS-COVID-2 Vaccination 10/22/2019, 11/19/2019  . Pneumococcal Conjugate-13 06/12/2017  . Pneumococcal-Unspecified 07/24/2016  . Tdap 07/12/2017    Qualifies for Shingles Vaccine?long term resident of [facility]  Screening Tests Health Maintenance  Topic Date Due  . URINE MICROALBUMIN  02/05/2020  . HEMOGLOBIN A1C  04/27/2020  . INFLUENZA VACCINE  05/16/2020  . FOOT EXAM  08/07/2020  . OPHTHALMOLOGY EXAM  08/27/2020  . TETANUS/TDAP  07/13/2027  . DEXA SCAN  Completed  . PNA vac Low Risk Adult  Completed    Cancer Screenings: Lung: Low Dose CT Chest recommended if Age 70-80 years, 30 pack-year currently smoking OR have quit w/in 15years. Patient not qualify. Breast:  Up to date on Mammogram? n/a   Up to date of Bone Density/Dexa? n/a Colorectal: n/a  Additional Screenings: n/a Hepatitis C Screening:      Plan:     I have personally reviewed and noted the following in the patient's chart:   . Medical and social history . Use of alcohol, tobacco or illicit drugs  . Current medications and supplements . Functional ability and status . Nutritional status . Physical activity . Advanced directives . List of other physicians . Hospitalizations, surgeries, and ER visits in previous 12 months . Vitals . Screenings to include cognitive, depression, and falls . Referrals and appointments  In addition, I have reviewed and discussed with patient certain preventive  protocols, quality metrics, and best practice recommendations. A written personalized care plan for preventive services as well as general preventive health recommendations were provided to patient.     Sharee Holster, NP  01/30/2020

## 2020-01-30 NOTE — Patient Instructions (Signed)
  Sabrina [name] , Thank you for taking time to come for your Medicare Wellness Visit. I appreciate your ongoing commitment to your health goals. Please review the following plan we discussed and let me know if I can assist you in the future.   These are the goals we discussed: Goals    . DIET - INCREASE WATER INTAKE    . Follow up with Primary Care Provider    . General - Client will not be readmitted within 30 days ([facility])       This is a list of the screening recommended for you and due dates:  Health Maintenance  Topic Date Due  . Urine Protein Check  02/05/2020  . Hemoglobin A1C  04/27/2020  . Flu Shot  05/16/2020  . Complete foot exam   08/07/2020  . Eye exam for diabetics  08/27/2020  . Tetanus Vaccine  07/13/2027  . DEXA scan (bone density measurement)  Completed  . Pneumonia vaccines  Completed

## 2020-02-02 DIAGNOSIS — M6281 Muscle weakness (generalized): Secondary | ICD-10-CM | POA: Diagnosis not present

## 2020-02-02 DIAGNOSIS — M810 Age-related osteoporosis without current pathological fracture: Secondary | ICD-10-CM | POA: Diagnosis not present

## 2020-02-02 DIAGNOSIS — I639 Cerebral infarction, unspecified: Secondary | ICD-10-CM | POA: Diagnosis not present

## 2020-02-02 DIAGNOSIS — R1312 Dysphagia, oropharyngeal phase: Secondary | ICD-10-CM | POA: Diagnosis not present

## 2020-02-02 DIAGNOSIS — R279 Unspecified lack of coordination: Secondary | ICD-10-CM | POA: Diagnosis not present

## 2020-02-03 DIAGNOSIS — M810 Age-related osteoporosis without current pathological fracture: Secondary | ICD-10-CM | POA: Diagnosis not present

## 2020-02-03 DIAGNOSIS — I639 Cerebral infarction, unspecified: Secondary | ICD-10-CM | POA: Diagnosis not present

## 2020-02-03 DIAGNOSIS — R279 Unspecified lack of coordination: Secondary | ICD-10-CM | POA: Diagnosis not present

## 2020-02-03 DIAGNOSIS — M6281 Muscle weakness (generalized): Secondary | ICD-10-CM | POA: Diagnosis not present

## 2020-02-03 DIAGNOSIS — R1312 Dysphagia, oropharyngeal phase: Secondary | ICD-10-CM | POA: Diagnosis not present

## 2020-02-04 DIAGNOSIS — I639 Cerebral infarction, unspecified: Secondary | ICD-10-CM | POA: Diagnosis not present

## 2020-02-04 DIAGNOSIS — M810 Age-related osteoporosis without current pathological fracture: Secondary | ICD-10-CM | POA: Diagnosis not present

## 2020-02-04 DIAGNOSIS — M6281 Muscle weakness (generalized): Secondary | ICD-10-CM | POA: Diagnosis not present

## 2020-02-04 DIAGNOSIS — R279 Unspecified lack of coordination: Secondary | ICD-10-CM | POA: Diagnosis not present

## 2020-02-04 DIAGNOSIS — R1312 Dysphagia, oropharyngeal phase: Secondary | ICD-10-CM | POA: Diagnosis not present

## 2020-02-05 ENCOUNTER — Encounter (HOSPITAL_COMMUNITY)
Admission: RE | Admit: 2020-02-05 | Discharge: 2020-02-05 | Disposition: A | Discharge status: Home / Self Care | Payer: Medicare Other | Source: Skilled Nursing Facility | Attending: Adult Health | Admitting: Adult Health

## 2020-02-05 DIAGNOSIS — R279 Unspecified lack of coordination: Secondary | ICD-10-CM | POA: Diagnosis not present

## 2020-02-05 DIAGNOSIS — M6281 Muscle weakness (generalized): Secondary | ICD-10-CM | POA: Diagnosis not present

## 2020-02-05 DIAGNOSIS — F0390 Unspecified dementia without behavioral disturbance: Secondary | ICD-10-CM | POA: Diagnosis present

## 2020-02-05 DIAGNOSIS — Z5189 Encounter for other specified aftercare: Secondary | ICD-10-CM | POA: Insufficient documentation

## 2020-02-05 DIAGNOSIS — E119 Type 2 diabetes mellitus without complications: Secondary | ICD-10-CM | POA: Insufficient documentation

## 2020-02-05 DIAGNOSIS — I639 Cerebral infarction, unspecified: Secondary | ICD-10-CM | POA: Diagnosis not present

## 2020-02-05 DIAGNOSIS — R1312 Dysphagia, oropharyngeal phase: Secondary | ICD-10-CM | POA: Diagnosis not present

## 2020-02-05 DIAGNOSIS — F339 Major depressive disorder, recurrent, unspecified: Secondary | ICD-10-CM | POA: Insufficient documentation

## 2020-02-05 DIAGNOSIS — R296 Repeated falls: Secondary | ICD-10-CM | POA: Insufficient documentation

## 2020-02-05 DIAGNOSIS — M810 Age-related osteoporosis without current pathological fracture: Secondary | ICD-10-CM | POA: Diagnosis not present

## 2020-02-06 DIAGNOSIS — M810 Age-related osteoporosis without current pathological fracture: Secondary | ICD-10-CM | POA: Diagnosis not present

## 2020-02-06 DIAGNOSIS — I639 Cerebral infarction, unspecified: Secondary | ICD-10-CM | POA: Diagnosis not present

## 2020-02-06 DIAGNOSIS — R279 Unspecified lack of coordination: Secondary | ICD-10-CM | POA: Diagnosis not present

## 2020-02-06 DIAGNOSIS — M6281 Muscle weakness (generalized): Secondary | ICD-10-CM | POA: Diagnosis not present

## 2020-02-06 DIAGNOSIS — R1312 Dysphagia, oropharyngeal phase: Secondary | ICD-10-CM | POA: Diagnosis not present

## 2020-02-06 LAB — MICROALBUMIN, URINE: Microalb, Ur: 161.6 ug/mL — ABNORMAL HIGH

## 2020-02-09 DIAGNOSIS — R279 Unspecified lack of coordination: Secondary | ICD-10-CM | POA: Diagnosis not present

## 2020-02-09 DIAGNOSIS — M810 Age-related osteoporosis without current pathological fracture: Secondary | ICD-10-CM | POA: Diagnosis not present

## 2020-02-09 DIAGNOSIS — I639 Cerebral infarction, unspecified: Secondary | ICD-10-CM | POA: Diagnosis not present

## 2020-02-09 DIAGNOSIS — M6281 Muscle weakness (generalized): Secondary | ICD-10-CM | POA: Diagnosis not present

## 2020-02-09 DIAGNOSIS — R1312 Dysphagia, oropharyngeal phase: Secondary | ICD-10-CM | POA: Diagnosis not present

## 2020-02-10 DIAGNOSIS — M6281 Muscle weakness (generalized): Secondary | ICD-10-CM | POA: Diagnosis not present

## 2020-02-10 DIAGNOSIS — R279 Unspecified lack of coordination: Secondary | ICD-10-CM | POA: Diagnosis not present

## 2020-02-10 DIAGNOSIS — R1312 Dysphagia, oropharyngeal phase: Secondary | ICD-10-CM | POA: Diagnosis not present

## 2020-02-10 DIAGNOSIS — I639 Cerebral infarction, unspecified: Secondary | ICD-10-CM | POA: Diagnosis not present

## 2020-02-10 DIAGNOSIS — M810 Age-related osteoporosis without current pathological fracture: Secondary | ICD-10-CM | POA: Diagnosis not present

## 2020-02-11 DIAGNOSIS — R279 Unspecified lack of coordination: Secondary | ICD-10-CM | POA: Diagnosis not present

## 2020-02-11 DIAGNOSIS — R1312 Dysphagia, oropharyngeal phase: Secondary | ICD-10-CM | POA: Diagnosis not present

## 2020-02-11 DIAGNOSIS — M6281 Muscle weakness (generalized): Secondary | ICD-10-CM | POA: Diagnosis not present

## 2020-02-11 DIAGNOSIS — M810 Age-related osteoporosis without current pathological fracture: Secondary | ICD-10-CM | POA: Diagnosis not present

## 2020-02-11 DIAGNOSIS — I639 Cerebral infarction, unspecified: Secondary | ICD-10-CM | POA: Diagnosis not present

## 2020-02-11 DIAGNOSIS — Z1159 Encounter for screening for other viral diseases: Secondary | ICD-10-CM | POA: Diagnosis not present

## 2020-02-12 DIAGNOSIS — R1312 Dysphagia, oropharyngeal phase: Secondary | ICD-10-CM | POA: Diagnosis not present

## 2020-02-12 DIAGNOSIS — M6281 Muscle weakness (generalized): Secondary | ICD-10-CM | POA: Diagnosis not present

## 2020-02-12 DIAGNOSIS — R279 Unspecified lack of coordination: Secondary | ICD-10-CM | POA: Diagnosis not present

## 2020-02-12 DIAGNOSIS — I639 Cerebral infarction, unspecified: Secondary | ICD-10-CM | POA: Diagnosis not present

## 2020-02-12 DIAGNOSIS — M810 Age-related osteoporosis without current pathological fracture: Secondary | ICD-10-CM | POA: Diagnosis not present

## 2020-02-17 DIAGNOSIS — R1312 Dysphagia, oropharyngeal phase: Secondary | ICD-10-CM | POA: Diagnosis not present

## 2020-02-17 DIAGNOSIS — M6281 Muscle weakness (generalized): Secondary | ICD-10-CM | POA: Diagnosis not present

## 2020-02-17 DIAGNOSIS — R279 Unspecified lack of coordination: Secondary | ICD-10-CM | POA: Diagnosis not present

## 2020-02-17 DIAGNOSIS — I639 Cerebral infarction, unspecified: Secondary | ICD-10-CM | POA: Diagnosis not present

## 2020-02-17 DIAGNOSIS — M810 Age-related osteoporosis without current pathological fracture: Secondary | ICD-10-CM | POA: Diagnosis not present

## 2020-02-18 DIAGNOSIS — R1312 Dysphagia, oropharyngeal phase: Secondary | ICD-10-CM | POA: Diagnosis not present

## 2020-02-18 DIAGNOSIS — R279 Unspecified lack of coordination: Secondary | ICD-10-CM | POA: Diagnosis not present

## 2020-02-18 DIAGNOSIS — M810 Age-related osteoporosis without current pathological fracture: Secondary | ICD-10-CM | POA: Diagnosis not present

## 2020-02-18 DIAGNOSIS — I639 Cerebral infarction, unspecified: Secondary | ICD-10-CM | POA: Diagnosis not present

## 2020-02-18 DIAGNOSIS — M6281 Muscle weakness (generalized): Secondary | ICD-10-CM | POA: Diagnosis not present

## 2020-02-19 ENCOUNTER — Encounter: Payer: Self-pay | Admitting: Adult Health

## 2020-02-19 ENCOUNTER — Non-Acute Institutional Stay (SKILLED_NURSING_FACILITY): Payer: Medicare Other | Admitting: Adult Health

## 2020-02-19 DIAGNOSIS — R1312 Dysphagia, oropharyngeal phase: Secondary | ICD-10-CM | POA: Diagnosis not present

## 2020-02-19 DIAGNOSIS — M6281 Muscle weakness (generalized): Secondary | ICD-10-CM | POA: Diagnosis not present

## 2020-02-19 DIAGNOSIS — F0390 Unspecified dementia without behavioral disturbance: Secondary | ICD-10-CM | POA: Diagnosis not present

## 2020-02-19 DIAGNOSIS — I639 Cerebral infarction, unspecified: Secondary | ICD-10-CM | POA: Diagnosis not present

## 2020-02-19 DIAGNOSIS — R279 Unspecified lack of coordination: Secondary | ICD-10-CM | POA: Diagnosis not present

## 2020-02-19 DIAGNOSIS — M810 Age-related osteoporosis without current pathological fracture: Secondary | ICD-10-CM | POA: Diagnosis not present

## 2020-02-19 DIAGNOSIS — I6930 Unspecified sequelae of cerebral infarction: Secondary | ICD-10-CM | POA: Diagnosis not present

## 2020-02-19 NOTE — Progress Notes (Signed)
Location:    [place] Room Number: 115/W Place of Service:  SNF (31)   CODE STATUS: DNR  No Known Allergies  Chief Complaint  Patient presents with  . Acute Visit    Care Plan Meeting    HPI:  We have come together for her care plan meeting. BIMS 5/15 mood 9/30. She has had 2 falls without injury. Her weight is trending downward from 115 pounds in April with her weight at 108 pounds in May. She is on honey thick liquids. Has a poor appetite. She requires extensive to dependent care with her adls. Is continent of bowel and bladder. Does not ambulate. She will continue to be followed for her chronic illnesses including: Chronic cerebrovascular accident  Dysphagia oropharyngeal phase  Dementia without behavioral disturbance unspecified dementia type  Past Medical History:  Diagnosis Date  . Asthma   . Dementia ([place])   . Diabetes mellitus   . H. pylori infection MAR 2014  . Hypertension   . Stroke [facility])     Past Surgical History:  Procedure Laterality Date  . CHOLECYSTECTOMY    . COLECTOMY     secondary to diverticulitis  . ESOPHAGOGASTRODUODENOSCOPY (EGD) WITH ESOPHAGEAL DILATION N/A 01/07/2013   TKZ:[id number] ring was found at the gastroesophageal junction/SINGLE DUODENAL DIVERTICULUM    Social History   Socioeconomic History  . Marital status: Widowed    Spouse name: Not on file  . Number of children: Not on file  . Years of education: Not on file  . Highest education level: Not on file  Occupational History  . Occupation: retired   Tobacco Use  . Smoking status: Never Smoker  . Smokeless tobacco: Never Used  Substance and Sexual Activity  . Alcohol use: No  . Drug use: No  . Sexual activity: Not Currently  Other Topics Concern  . Not on file  Social History Narrative   Is a long term resident of [facility]    Social Determinants of Health   Financial Resource Strain:   . Difficulty of Paying Living Expenses:   Food Insecurity:   .  Worried About Charity fundraiser in the Last Year:   . Arboriculturist in the Last Year:   Transportation Needs:   . Film/video editor (Medical):   Marland Kitchen Lack of Transportation (Non-Medical):   Physical Activity:   . Days of Exercise per Week:   . Minutes of Exercise per Session:   Stress: No Stress Concern Present  . Feeling of Stress : Not at all  Social Connections:   . Frequency of Communication with Friends and Family:   . Frequency of Social Gatherings with Friends and Family:   . Attends Religious Services:   . Active Member of Clubs or Organizations:   . Attends Archivist Meetings:   Marland Kitchen Marital Status:   Intimate Partner Violence:   . Fear of Current or Ex-Partner:   . Emotionally Abused:   Marland Kitchen Physically Abused:   . Sexually Abused:    Family History  Problem Relation Age of Onset  . Hypertension Mother   . Colon cancer Neg Hx       VITAL SIGNS BP 107/60   Pulse 83   Temp (!) 97.3 F (36.3 C) (Oral)   Resp 18   Ht 5\' 5"  (1.651 m)   Wt 108 lb 6.4 oz (49.2 kg)   SpO2 95%   BMI 18.04 kg/m   Outpatient Encounter Medications as of  02/19/2020  Medication Sig  . Balsam [place]-Castor Oil (VENELEX) OINT Apply to sacrum and bilateral buttocks q shift & prn every shift  . citalopram (CELEXA) 10 MG tablet Take 20 mg by mouth daily.   Marland Kitchen denosumab (PROLIA) 60 MG/ML SOLN injection Inject 60 mg into the skin every 6 (six) months. Administer in upper arm, thigh, or abdomen  . guaiFENesin-dextromethorphan (ROBITUSSIN DM) 100-10 MG/5ML syrup Take 15 mLs by mouth every 6 (six) hours as needed for cough.  . loratadine (CLARITIN) 10 MG tablet Take 10 mg by mouth daily.  . memantine (NAMENDA) 10 MG tablet Take 10 mg by mouth 2 (two) times daily.  . mirtazapine (REMERON) 7.5 MG tablet Take 7.5 mg by mouth at bedtime. For poor appetite  . NON FORMULARY Diet Order: Dysphagia 1 (puree), continue honey thick liquids. No sausage, no gravy on meat - gravy in bowls.  . NON  FORMULARY Take 1 each by mouth 3 (three) times daily between meals. Magic Cup  . omeprazole (PRILOSEC) 40 MG capsule Take 40 mg by mouth 2 (two) times daily.   . ondansetron (ZOFRAN) 4 MG tablet Take 4 mg by mouth 3 (three) times daily before meals.   . [DISCONTINUED] Calcium Carbonate-Vitamin D (CALCIUM 500/VITAMIN D PO) Take 500 mg by mouth 2 (two) times daily.   No facility-administered encounter medications on file as of 02/19/2020.     SIGNIFICANT DIAGNOSTIC EXAMS   LABS REVIEWED PREVIOUS   03-25-19: wbc 7.7; hgb 14.2; hct 44.7; mcv 100.9; plt 263; glucose 118; bun 13; creat 0.86; k+ 4.7; an++ 142; ca 9.4; vit B 12: 361  06-12-19: wbc 8.1; hgb 13.4; hct 42.6; mcv 100;0 plt 278; 08-11-19: hgb a1c 6.1  10-29-19: wbc 6.5; hgb 10.2; hct 34.5; mcv 84.8 plt 322; glucose 112; bun 21; creat 0.89; k+ 3.8; na++ 143; ca 9.0 liver normal albumin 3.1 hgb a1c 6.4  10-31-19: guaiac: + and - 11-05-19: hgb 9.8; hct 31.8 11-09-19: guaiac +   TODAY  02-05-20: urine for micro-albumin 161.6   Review of Systems  Unable to perform ROS: Dementia (unable to participate )    Physical Exam Constitutional:      General: She is not in acute distress.    Appearance: She is underweight. She is not diaphoretic.  Neck:     Thyroid: No thyromegaly.  Cardiovascular:     Rate and Rhythm: Normal rate and regular rhythm.     Pulses: Normal pulses.     Heart sounds: Normal heart sounds.  Pulmonary:     Effort: Pulmonary effort is normal. No respiratory distress.     Breath sounds: Normal breath sounds.  Abdominal:     General: Bowel sounds are normal. There is no distension.     Palpations: Abdomen is soft.     Tenderness: There is no abdominal tenderness.  Musculoskeletal:        General: Normal range of motion.     Cervical back: Neck supple.     Right lower leg: No edema.     Left lower leg: No edema.  Lymphadenopathy:     Cervical: No cervical adenopathy.  Skin:    General: Skin is warm and dry.    Neurological:     Mental Status: She is alert. Mental status is at baseline.  Psychiatric:        Mood and Affect: Mood normal.     ASSESSMENT/ PLAN:  TODAY'  1. Chronic cerebrovascular accident 2. Dysphagia oropharyngeal phase 3. Dementia without behavioral  disturbance unspecified dementia type  1.Will stop calcium 2.Will begin remeron 7.5 mg nightly for 30 days to help with appetite 3. Will increase celexa to 20 mg daily  Will continue to monitor her status.      MD is aware of resident's narcotic use and is in agreement with current plan of care. We will attempt to wean resident as appropriate.  Synthia Innocent NP [facility]  Contact [phone] Monday through Friday 8am- 5pm  After hours call [phone]

## 2020-02-20 DIAGNOSIS — R1312 Dysphagia, oropharyngeal phase: Secondary | ICD-10-CM | POA: Diagnosis not present

## 2020-02-20 DIAGNOSIS — R279 Unspecified lack of coordination: Secondary | ICD-10-CM | POA: Diagnosis not present

## 2020-02-20 DIAGNOSIS — M810 Age-related osteoporosis without current pathological fracture: Secondary | ICD-10-CM | POA: Diagnosis not present

## 2020-02-20 DIAGNOSIS — M6281 Muscle weakness (generalized): Secondary | ICD-10-CM | POA: Diagnosis not present

## 2020-02-20 DIAGNOSIS — I639 Cerebral infarction, unspecified: Secondary | ICD-10-CM | POA: Diagnosis not present

## 2020-02-23 DIAGNOSIS — R1312 Dysphagia, oropharyngeal phase: Secondary | ICD-10-CM | POA: Diagnosis not present

## 2020-02-23 DIAGNOSIS — M810 Age-related osteoporosis without current pathological fracture: Secondary | ICD-10-CM | POA: Diagnosis not present

## 2020-02-23 DIAGNOSIS — M6281 Muscle weakness (generalized): Secondary | ICD-10-CM | POA: Diagnosis not present

## 2020-02-23 DIAGNOSIS — R279 Unspecified lack of coordination: Secondary | ICD-10-CM | POA: Diagnosis not present

## 2020-02-23 DIAGNOSIS — I639 Cerebral infarction, unspecified: Secondary | ICD-10-CM | POA: Diagnosis not present

## 2020-02-24 DIAGNOSIS — M810 Age-related osteoporosis without current pathological fracture: Secondary | ICD-10-CM | POA: Diagnosis not present

## 2020-02-24 DIAGNOSIS — M6281 Muscle weakness (generalized): Secondary | ICD-10-CM | POA: Diagnosis not present

## 2020-02-24 DIAGNOSIS — R1312 Dysphagia, oropharyngeal phase: Secondary | ICD-10-CM | POA: Diagnosis not present

## 2020-02-24 DIAGNOSIS — R279 Unspecified lack of coordination: Secondary | ICD-10-CM | POA: Diagnosis not present

## 2020-02-24 DIAGNOSIS — I639 Cerebral infarction, unspecified: Secondary | ICD-10-CM | POA: Diagnosis not present

## 2020-02-27 ENCOUNTER — Non-Acute Institutional Stay (SKILLED_NURSING_FACILITY): Payer: Medicare Other | Admitting: Adult Health

## 2020-02-27 ENCOUNTER — Encounter: Payer: Self-pay | Admitting: Adult Health

## 2020-02-27 DIAGNOSIS — J3089 Other allergic rhinitis: Secondary | ICD-10-CM

## 2020-02-27 DIAGNOSIS — M810 Age-related osteoporosis without current pathological fracture: Secondary | ICD-10-CM | POA: Diagnosis not present

## 2020-02-27 DIAGNOSIS — I6930 Unspecified sequelae of cerebral infarction: Secondary | ICD-10-CM

## 2020-02-27 DIAGNOSIS — R279 Unspecified lack of coordination: Secondary | ICD-10-CM | POA: Diagnosis not present

## 2020-02-27 DIAGNOSIS — K219 Gastro-esophageal reflux disease without esophagitis: Secondary | ICD-10-CM

## 2020-02-27 DIAGNOSIS — R1312 Dysphagia, oropharyngeal phase: Secondary | ICD-10-CM | POA: Diagnosis not present

## 2020-02-27 DIAGNOSIS — I639 Cerebral infarction, unspecified: Secondary | ICD-10-CM | POA: Diagnosis not present

## 2020-02-27 DIAGNOSIS — M6281 Muscle weakness (generalized): Secondary | ICD-10-CM | POA: Diagnosis not present

## 2020-02-27 NOTE — Progress Notes (Signed)
Location:    [facility] Room Number: 115/W Place of Service:  SNF (31)   CODE STATUS: DNR  No Known Allergies  Chief Complaint  Patient presents with  . Medical Management of Chronic Issues           Chronic CVA: Chronic non-seasonal allergic rhinitis:   GERD without esophagitis:     HPI:  She is a 86 year old long term resident of this facility being seen for the management of her chronic illnesses: cva; allergic rhinitis; gerd. There are no reports of sinus congestion; heart burn or nausea.   Past Medical History:  Diagnosis Date  . Asthma   . Dementia ([facility])   . Diabetes mellitus   . H. pylori infection MAR 2014  . Hypertension   . Stroke [facility])     Past Surgical History:  Procedure Laterality Date  . CHOLECYSTECTOMY    . COLECTOMY     secondary to diverticulitis  . ESOPHAGOGASTRODUODENOSCOPY (EGD) WITH ESOPHAGEAL DILATION N/A 01/07/2013   [id number] ring was found at the gastroesophageal junction/SINGLE DUODENAL DIVERTICULUM    Social History   Socioeconomic History  . Marital status: Widowed    Spouse name: Not on file  . Number of children: Not on file  . Years of education: Not on file  . Highest education level: Not on file  Occupational History  . Occupation: retired   Tobacco Use  . Smoking status: Never Smoker  . Smokeless tobacco: Never Used  Substance and Sexual Activity  . Alcohol use: No  . Drug use: No  . Sexual activity: Not Currently  Other Topics Concern  . Not on file  Social History Narrative   Is a long term resident of [facility]    Social Determinants of Health   Financial Resource Strain:   . Difficulty of Paying Living Expenses:   Food Insecurity:   . Worried About Programme researcher, broadcasting/film/video in the Last Year:   . Barista in the Last Year:   Transportation Needs:   . Freight forwarder (Medical):   Marland Kitchen Lack of Transportation (Non-Medical):   Physical Activity:   . Days of Exercise per Week:   .  Minutes of Exercise per Session:   Stress: No Stress Concern Present  . Feeling of Stress : Not at all  Social Connections:   . Frequency of Communication with Friends and Family:   . Frequency of Social Gatherings with Friends and Family:   . Attends Religious Services:   . Active Member of Clubs or Organizations:   . Attends Banker Meetings:   Marland Kitchen Marital Status:   Intimate Partner Violence:   . Fear of Current or Ex-Partner:   . Emotionally Abused:   Marland Kitchen Physically Abused:   . Sexually Abused:    Family History  Problem Relation Age of Onset  . Hypertension Mother   . Colon cancer Neg Hx       VITAL SIGNS BP 135/70   Pulse 73   Temp 98.4 F (36.9 C) (Oral)   Resp 20   Ht 5\' 5"  (1.651 m)   Wt 108 lb 6.4 oz (49.2 kg)   SpO2 95%   BMI 18.04 kg/m   Outpatient Encounter Medications as of 02/27/2020  Medication Sig  . Balsam [place]-Castor Oil (VENELEX) OINT Apply to sacrum and bilateral buttocks q shift & prn every shift  . citalopram (CELEXA) 10 MG tablet Take 20 mg by mouth daily.   02/29/2020  denosumab (PROLIA) 60 MG/ML SOLN injection Inject 60 mg into the skin every 6 (six) months. Administer in upper arm, thigh, or abdomen  . guaiFENesin-dextromethorphan (ROBITUSSIN DM) 100-10 MG/5ML syrup Take 15 mLs by mouth every 6 (six) hours as needed for cough.  . loratadine (CLARITIN) 10 MG tablet Take 10 mg by mouth daily.  . memantine (NAMENDA) 10 MG tablet Take 10 mg by mouth 2 (two) times daily.  . mirtazapine (REMERON) 7.5 MG tablet Take 7.5 mg by mouth at bedtime. For poor appetite  . NON FORMULARY Diet Order: Dysphagia 1 (puree), continue honey thick liquids. No sausage, no gravy on meat - gravy in bowls.  . NON FORMULARY Take 1 each by mouth 3 (three) times daily between meals. Magic Cup  . omeprazole (PRILOSEC) 40 MG capsule Take 40 mg by mouth 2 (two) times daily.   . ondansetron (ZOFRAN) 4 MG tablet Take 4 mg by mouth 3 (three) times daily before meals.    No  facility-administered encounter medications on file as of 02/27/2020.     SIGNIFICANT DIAGNOSTIC EXAMS  LABS REVIEWED PREVIOUS   03-25-19: wbc 7.7; hgb 14.2; hct 44.7; mcv 100.9; plt 263; glucose 118; bun 13; creat 0.86; k+ 4.7; an++ 142; ca 9.4; vit B 12: 361  06-12-19: wbc 8.1; hgb 13.4; hct 42.6; mcv 100;0 plt 278; 08-11-19: hgb a1c 6.1  10-29-19: wbc 6.5; hgb 10.2; hct 34.5; mcv 84.8 plt 322; glucose 112; bun 21; creat 0.89; k+ 3.8; na++ 143; ca 9.0 liver normal albumin 3.1 hgb a1c 6.4  10-31-19: guaiac: + and - 11-05-19: hgb 9.8; hct 31.8 11-09-19: guaiac +   TODAY  02-05-20: urine micro-albumin: 161.6    Review of Systems  Unable to perform ROS: Dementia (unable to participate )    Physical Exam Constitutional:      General: She is not in acute distress.    Appearance: She is well-developed. She is not diaphoretic.  Neck:     Thyroid: No thyromegaly.  Cardiovascular:     Rate and Rhythm: Normal rate and regular rhythm.     Pulses: Normal pulses.     Heart sounds: Normal heart sounds.  Pulmonary:     Effort: Pulmonary effort is normal. No respiratory distress.     Breath sounds: Normal breath sounds.  Abdominal:     General: Bowel sounds are normal. There is no distension.     Palpations: Abdomen is soft.     Tenderness: There is no abdominal tenderness.  Musculoskeletal:        General: Normal range of motion.     Cervical back: Neck supple.     Right lower leg: No edema.     Left lower leg: No edema.  Lymphadenopathy:     Cervical: No cervical adenopathy.  Skin:    General: Skin is warm and dry.  Neurological:     Mental Status: She is alert. Mental status is at baseline.  Psychiatric:        Mood and Affect: Mood normal.     ASSESSMENT/ PLAN:  TODAY  1.  Chronic CVA: is stable will continue to monitor her status   2. Chronic non-seasonal allergic rhinitis: is stable will continue claritin 10 mg daily   3. GERD without esophagitis: is stable will  continue prilosec 40 mg twice daily and zofran 4 mg prior to meals.   PREVIOUS   4. Anemia due to blood loss chronic: is without change hgb 9.8. unable to tolerate iron. Not a  candidate for aggressive workup. Will continue to monitor has positive guaiac stools.   5. Major depression recurrent chronic is stable will continue celexa 20 mg daily has failed one wean  6. Age related osteoporosis without current pathological fracture is stable will continue prolia 60 mg every 6 months with supplements.   7. Unspecified type dementia without behavioral disturbance: is without change weight is 108 pounds; will continue namenda 10 mg twice daily   8. [facility] hypertension: is stable 135/70 will continue to monitor her status.   9. Dysphagia oropharyngeal phase no signs of aspiration: will continue honey thick liquids.              MD is aware of resident's narcotic use and is in agreement with current plan of care. We will attempt to wean resident as appropriate.  Synthia Innocent NP [facility]  Contact [phone] Monday through Friday 8am- 5pm  After hours call [phone]

## 2020-03-01 DIAGNOSIS — M6281 Muscle weakness (generalized): Secondary | ICD-10-CM | POA: Diagnosis not present

## 2020-03-01 DIAGNOSIS — M810 Age-related osteoporosis without current pathological fracture: Secondary | ICD-10-CM | POA: Diagnosis not present

## 2020-03-01 DIAGNOSIS — R1312 Dysphagia, oropharyngeal phase: Secondary | ICD-10-CM | POA: Diagnosis not present

## 2020-03-01 DIAGNOSIS — R279 Unspecified lack of coordination: Secondary | ICD-10-CM | POA: Diagnosis not present

## 2020-03-01 DIAGNOSIS — I639 Cerebral infarction, unspecified: Secondary | ICD-10-CM | POA: Diagnosis not present

## 2020-03-02 DIAGNOSIS — R279 Unspecified lack of coordination: Secondary | ICD-10-CM | POA: Diagnosis not present

## 2020-03-02 DIAGNOSIS — M6281 Muscle weakness (generalized): Secondary | ICD-10-CM | POA: Diagnosis not present

## 2020-03-02 DIAGNOSIS — I639 Cerebral infarction, unspecified: Secondary | ICD-10-CM | POA: Diagnosis not present

## 2020-03-02 DIAGNOSIS — R1312 Dysphagia, oropharyngeal phase: Secondary | ICD-10-CM | POA: Diagnosis not present

## 2020-03-02 DIAGNOSIS — M810 Age-related osteoporosis without current pathological fracture: Secondary | ICD-10-CM | POA: Diagnosis not present

## 2020-03-03 DIAGNOSIS — M6281 Muscle weakness (generalized): Secondary | ICD-10-CM | POA: Diagnosis not present

## 2020-03-03 DIAGNOSIS — R279 Unspecified lack of coordination: Secondary | ICD-10-CM | POA: Diagnosis not present

## 2020-03-03 DIAGNOSIS — R1312 Dysphagia, oropharyngeal phase: Secondary | ICD-10-CM | POA: Diagnosis not present

## 2020-03-03 DIAGNOSIS — I639 Cerebral infarction, unspecified: Secondary | ICD-10-CM | POA: Diagnosis not present

## 2020-03-03 DIAGNOSIS — M810 Age-related osteoporosis without current pathological fracture: Secondary | ICD-10-CM | POA: Diagnosis not present

## 2020-03-24 ENCOUNTER — Non-Acute Institutional Stay (SKILLED_NURSING_FACILITY): Payer: Medicare Other | Admitting: Adult Health

## 2020-03-24 ENCOUNTER — Encounter: Payer: Self-pay | Admitting: Adult Health

## 2020-03-24 DIAGNOSIS — F339 Major depressive disorder, recurrent, unspecified: Secondary | ICD-10-CM | POA: Diagnosis not present

## 2020-03-24 DIAGNOSIS — M810 Age-related osteoporosis without current pathological fracture: Secondary | ICD-10-CM | POA: Diagnosis not present

## 2020-03-24 DIAGNOSIS — D500 Iron deficiency anemia secondary to blood loss (chronic): Secondary | ICD-10-CM | POA: Diagnosis not present

## 2020-03-24 NOTE — Progress Notes (Signed)
Location:    [facility] Room Number: 115/W Place of Service:  SNF (31)   CODE STATUS: DNR  No Known Allergies  Chief Complaint  Patient presents with  . Medical Management of Chronic Issues          Anemia due to blood loss chronic  Major depression recurrent chronic:Age related osteoporosis without current pathological fracture     HPI:  She is a 86 year old long term resident of this facility being seen for the management of her chronic illnesses: anemia; depression; osteoporosis. There are no reports of uncontrolled pain; no changes in appetite; no reports of agitation or anxiety.   Past Medical History:  Diagnosis Date  . Asthma   . Dementia ([facility])   . Diabetes mellitus   . H. pylori infection MAR 2014  . Hypertension   . Stroke [facility])     Past Surgical History:  Procedure Laterality Date  . CHOLECYSTECTOMY    . COLECTOMY     secondary to diverticulitis  . ESOPHAGOGASTRODUODENOSCOPY (EGD) WITH ESOPHAGEAL DILATION N/A 01/07/2013   [id number] ring was found at the gastroesophageal junction/SINGLE DUODENAL DIVERTICULUM    Social History   Socioeconomic History  . Marital status: Widowed    Spouse name: Not on file  . Number of children: Not on file  . Years of education: Not on file  . Highest education level: Not on file  Occupational History  . Occupation: retired   Tobacco Use  . Smoking status: Never Smoker  . Smokeless tobacco: Never Used  Substance and Sexual Activity  . Alcohol use: No  . Drug use: No  . Sexual activity: Not Currently  Other Topics Concern  . Not on file  Social History Narrative   Is a long term resident of [facility]    Social Determinants of Health   Financial Resource Strain:   . Difficulty of Paying Living Expenses:   Food Insecurity:   . Worried About Programme researcher, broadcasting/film/video in the Last Year:   . Barista in the Last Year:   Transportation Needs:   . Freight forwarder (Medical):   Marland Kitchen Lack of  Transportation (Non-Medical):   Physical Activity:   . Days of Exercise per Week:   . Minutes of Exercise per Session:   Stress: No Stress Concern Present  . Feeling of Stress : Not at all  Social Connections:   . Frequency of Communication with Friends and Family:   . Frequency of Social Gatherings with Friends and Family:   . Attends Religious Services:   . Active Member of Clubs or Organizations:   . Attends Banker Meetings:   Marland Kitchen Marital Status:   Intimate Partner Violence:   . Fear of Current or Ex-Partner:   . Emotionally Abused:   Marland Kitchen Physically Abused:   . Sexually Abused:    Family History  Problem Relation Age of Onset  . Hypertension Mother   . Colon cancer Neg Hx       VITAL SIGNS BP 110/67   Pulse 74   Temp 98.2 F (36.8 C) (Oral)   Resp 20   Ht 5\' 5"  (1.651 m)   Wt 111 lb 9.6 oz (50.6 kg)   SpO2 95%   BMI 18.57 kg/m   Outpatient Encounter Medications as of 03/24/2020  Medication Sig  . Balsam [place]-Castor Oil (VENELEX) OINT Apply to sacrum and bilateral buttocks q shift & prn every shift  . citalopram (CELEXA)  10 MG tablet Take 20 mg by mouth at bedtime.   Marland Kitchen denosumab (PROLIA) 60 MG/ML SOLN injection Inject 60 mg into the skin every 6 (six) months. Administer in upper arm, thigh, or abdomen  . guaiFENesin-dextromethorphan (ROBITUSSIN DM) 100-10 MG/5ML syrup Take 15 mLs by mouth every 6 (six) hours as needed for cough.  . loratadine (CLARITIN) 10 MG tablet Take 10 mg by mouth daily.  . memantine (NAMENDA) 10 MG tablet Take 10 mg by mouth 2 (two) times daily.  . NON FORMULARY Diet Order: Dysphagia 1 (puree), continue honey thick liquids. No sausage, no gravy on meat - gravy in bowls.  . NON FORMULARY Take 1 each by mouth 3 (three) times daily between meals. Magic Cup  . omeprazole (PRILOSEC) 40 MG capsule Take 40 mg by mouth 2 (two) times daily.   . ondansetron (ZOFRAN) 4 MG tablet Take 4 mg by mouth 3 (three) times daily before meals.   .  [DISCONTINUED] mirtazapine (REMERON) 7.5 MG tablet Take 7.5 mg by mouth at bedtime. For poor appetite   No facility-administered encounter medications on file as of 03/24/2020.     SIGNIFICANT DIAGNOSTIC EXAMS   LABS REVIEWED PREVIOUS   03-25-19: wbc 7.7; hgb 14.2; hct 44.7; mcv 100.9; plt 263; glucose 118; bun 13; creat 0.86; k+ 4.7; an++ 142; ca 9.4; vit B 12: 361  06-12-19: wbc 8.1; hgb 13.4; hct 42.6; mcv 100;0 plt 278; 08-11-19: hgb a1c 6.1  10-29-19: wbc 6.5; hgb 10.2; hct 34.5; mcv 84.8 plt 322; glucose 112; bun 21; creat 0.89; k+ 3.8; na++ 143; ca 9.0 liver normal albumin 3.1 hgb a1c 6.4  10-31-19: guaiac: + and - 11-05-19: hgb 9.8; hct 31.8 11-09-19: guaiac +  02-05-20: urine micro-albumin: 161.6   NO NEW LABS    Review of Systems  Unable to perform ROS: Dementia (unable to participate )    Physical Exam Constitutional:      General: She is not in acute distress.    Appearance: She is well-developed. She is not diaphoretic.  Neck:     Thyroid: No thyromegaly.  Cardiovascular:     Rate and Rhythm: Normal rate and regular rhythm.     Pulses: Normal pulses.     Heart sounds: Normal heart sounds.  Pulmonary:     Effort: Pulmonary effort is normal. No respiratory distress.     Breath sounds: Normal breath sounds.  Abdominal:     General: Bowel sounds are normal. There is no distension.     Palpations: Abdomen is soft.     Tenderness: There is no abdominal tenderness.  Musculoskeletal:        General: Normal range of motion.     Cervical back: Neck supple.     Right lower leg: No edema.     Left lower leg: No edema.  Lymphadenopathy:     Cervical: No cervical adenopathy.  Skin:    General: Skin is warm and dry.  Neurological:     Mental Status: She is alert. Mental status is at baseline.  Psychiatric:        Mood and Affect: Mood normal.      ASSESSMENT/ PLAN:  TODAY  1. Anemia due to blood loss chronic is without change hgb 9. Unable to tolerate iron. Not a  candidate for aggressive workup. Will continue to monitor has history of positive guaiac stools  2. Major depression recurrent chronic: is stable will continue celexa 20 mg daily has failed one wean  3. Age related  osteoporosis without current pathological fracture is stable will continue prolia 60 mg every 6 months.   PREVIOUS   4. Unspecified type dementia without behavioral disturbance: is without change weight is 111 pounds; will continue namenda 10 mg twice daily   5. [facility] hypertension: is stable 110/67 will continue to monitor her status.   6. Dysphagia oropharyngeal phase no signs of aspiration: will continue honey thick liquids.   7.  Chronic CVA: is stable will continue to monitor her status   8. Chronic non-seasonal allergic rhinitis: is stable will continue claritin 10 mg daily   9. GERD without esophagitis: is stable will continue prilosec 40 mg twice daily and zofran 4 mg prior to meals.         MD is aware of resident's narcotic use and is in agreement with current plan of care. We will attempt to wean resident as appropriate.  Synthia Innocent NP [facility]  Contact [phone] Monday through Friday 8am- 5pm  After hours call [phone]

## 2020-03-29 ENCOUNTER — Encounter (HOSPITAL_COMMUNITY)
Admission: RE | Admit: 2020-03-29 | Discharge: 2020-03-29 | Disposition: A | Discharge status: Home / Self Care | Payer: Medicare Other | Source: Skilled Nursing Facility | Attending: Adult Health | Admitting: Adult Health

## 2020-03-29 DIAGNOSIS — R296 Repeated falls: Secondary | ICD-10-CM | POA: Insufficient documentation

## 2020-03-29 DIAGNOSIS — Z5189 Encounter for other specified aftercare: Secondary | ICD-10-CM | POA: Diagnosis not present

## 2020-03-29 DIAGNOSIS — R279 Unspecified lack of coordination: Secondary | ICD-10-CM | POA: Insufficient documentation

## 2020-03-29 DIAGNOSIS — F339 Major depressive disorder, recurrent, unspecified: Secondary | ICD-10-CM | POA: Insufficient documentation

## 2020-03-29 DIAGNOSIS — E119 Type 2 diabetes mellitus without complications: Secondary | ICD-10-CM | POA: Insufficient documentation

## 2020-03-29 DIAGNOSIS — F0390 Unspecified dementia without behavioral disturbance: Secondary | ICD-10-CM | POA: Diagnosis present

## 2020-03-29 LAB — CBC
HCT: 26.8 % — ABNORMAL LOW (ref 36.0–46.0)
Hemoglobin: 7.7 g/dL — ABNORMAL LOW (ref 12.0–15.0)
MCH: 22.2 pg — ABNORMAL LOW (ref 26.0–34.0)
MCHC: 28.7 g/dL — ABNORMAL LOW (ref 30.0–36.0)
MCV: 77.2 fL — ABNORMAL LOW (ref 80.0–100.0)
Platelets: 373 10*3/uL (ref 150–400)
RBC: 3.47 MIL/uL — ABNORMAL LOW (ref 3.87–5.11)
RDW: 19.9 % — ABNORMAL HIGH (ref 11.5–15.5)
WBC: 5.5 10*3/uL (ref 4.0–10.5)
nRBC: 0 % (ref 0.0–0.2)

## 2020-03-29 LAB — COMPREHENSIVE METABOLIC PANEL
ALT: 8 U/L (ref 0–44)
AST: 15 U/L (ref 15–41)
Albumin: 3 g/dL — ABNORMAL LOW (ref 3.5–5.0)
Alkaline Phosphatase: 56 U/L (ref 38–126)
Anion gap: 10 (ref 5–15)
BUN: 19 mg/dL (ref 8–23)
CO2: 25 mmol/L (ref 22–32)
Calcium: 8.3 mg/dL — ABNORMAL LOW (ref 8.9–10.3)
Chloride: 107 mmol/L (ref 98–111)
Creatinine, Ser: 0.74 mg/dL (ref 0.44–1.00)
GFR calc Af Amer: >60 mL/min (ref >60)
GFR calc non Af Amer: >60 mL/min (ref >60)
Glucose, Bld: 71 mg/dL (ref 70–99)
Potassium: 3.8 mmol/L (ref 3.5–5.1)
Sodium: 142 mmol/L (ref 135–145)
Total Bilirubin: 0.4 mg/dL (ref 0.3–1.2)
Total Protein: 6.1 g/dL — ABNORMAL LOW (ref 6.5–8.1)

## 2020-03-29 LAB — HEMOGLOBIN A1C
Hgb A1c MFr Bld: 6.3 % — ABNORMAL HIGH (ref 4.8–5.6)
Mean Plasma Glucose: 134.11 mg/dL

## 2020-03-31 ENCOUNTER — Non-Acute Institutional Stay: Payer: Self-pay | Admitting: Adult Health

## 2020-03-31 ENCOUNTER — Encounter: Payer: Self-pay | Admitting: Adult Health

## 2020-03-31 DIAGNOSIS — F339 Major depressive disorder, recurrent, unspecified: Secondary | ICD-10-CM

## 2020-03-31 NOTE — Progress Notes (Signed)
Location:    [place] Room Number: 115/W Place of Service:  SNF (31)   CODE STATUS: DNR  No Known Allergies  Chief Complaint  Patient presents with  . Acute Visit    Medication Review    HPI:  We have come together for her medication review. She is presently taking celexa 20 mg daily. She had her dose reduced earlier in the year and failed that wean with a return to her current dose. At this time there are no reports of tearfulness; no changes in appetite; no agitation no insomnia. She is benefiting from her current dose and to lower it would cause her emotional harm   Past Medical History:  Diagnosis Date  . Asthma   . Dementia ([place])   . Diabetes mellitus   . H. pylori infection MAR 2014  . Hypertension   . Stroke [facility])     Past Surgical History:  Procedure Laterality Date  . CHOLECYSTECTOMY    . COLECTOMY     secondary to diverticulitis  . ESOPHAGOGASTRODUODENOSCOPY (EGD) WITH ESOPHAGEAL DILATION N/A 01/07/2013   [id number] ring was found at the gastroesophageal junction/SINGLE DUODENAL DIVERTICULUM    Social History   Socioeconomic History  . Marital status: Widowed    Spouse name: Not on file  . Number of children: Not on file  . Years of education: Not on file  . Highest education level: Not on file  Occupational History  . Occupation: retired   Tobacco Use  . Smoking status: Never Smoker  . Smokeless tobacco: Never Used  Vaping Use  . Vaping Use: Never used  Substance and Sexual Activity  . Alcohol use: No  . Drug use: No  . Sexual activity: Not Currently  Other Topics Concern  . Not on file  Social History Narrative   Is a long term resident of [facility]    Social Determinants of Health   Financial Resource Strain:   . Difficulty of Paying Living Expenses:   Food Insecurity:   . Worried About Charity fundraiser in the Last Year:   . Arboriculturist in the Last Year:   Transportation Needs:   . Film/video editor  (Medical):   Marland Kitchen Lack of Transportation (Non-Medical):   Physical Activity:   . Days of Exercise per Week:   . Minutes of Exercise per Session:   Stress: No Stress Concern Present  . Feeling of Stress : Not at all  Social Connections:   . Frequency of Communication with Friends and Family:   . Frequency of Social Gatherings with Friends and Family:   . Attends Religious Services:   . Active Member of Clubs or Organizations:   . Attends Archivist Meetings:   Marland Kitchen Marital Status:   Intimate Partner Violence:   . Fear of Current or Ex-Partner:   . Emotionally Abused:   Marland Kitchen Physically Abused:   . Sexually Abused:    Family History  Problem Relation Age of Onset  . Hypertension Mother   . Colon cancer Neg Hx       VITAL SIGNS BP 129/70   Pulse 78   Temp 98.2 F (36.8 C) (Oral)   Resp 16   Ht 5\' 5"  (1.651 m)   Wt 111 lb 9.6 oz (50.6 kg)   BMI 18.57 kg/m   Outpatient Encounter Medications as of 03/31/2020  Medication Sig  . Balsam [place]-Castor Oil (VENELEX) OINT Apply to sacrum and bilateral buttocks q shift &  prn every shift  . citalopram (CELEXA) 10 MG tablet Take 20 mg by mouth at bedtime.   Marland Kitchen denosumab (PROLIA) 60 MG/ML SOLN injection Inject 60 mg into the skin every 6 (six) months. Administer in upper arm, thigh, or abdomen  . guaiFENesin-dextromethorphan (ROBITUSSIN DM) 100-10 MG/5ML syrup Take 15 mLs by mouth every 6 (six) hours as needed for cough.  . loratadine (CLARITIN) 10 MG tablet Take 10 mg by mouth daily.  . memantine (NAMENDA) 10 MG tablet Take 10 mg by mouth 2 (two) times daily.  . NON FORMULARY Diet Order: Dysphagia 1 (puree), continue honey thick liquids. No sausage, no gravy on meat - gravy in bowls.  . NON FORMULARY Take 1 each by mouth 3 (three) times daily between meals. Magic Cup  . omeprazole (PRILOSEC) 40 MG capsule Take 40 mg by mouth 2 (two) times daily.   . ondansetron (ZOFRAN) 4 MG tablet Take 4 mg by mouth 3 (three) times daily before meals.     No facility-administered encounter medications on file as of 03/31/2020.     SIGNIFICANT DIAGNOSTIC EXAMS   LABS REVIEWED PREVIOUS   03-25-19: wbc 7.7; hgb 14.2; hct 44.7; mcv 100.9; plt 263; glucose 118; bun 13; creat 0.86; k+ 4.7; an++ 142; ca 9.4; vit B 12: 361  06-12-19: wbc 8.1; hgb 13.4; hct 42.6; mcv 100;0 plt 278; 08-11-19: hgb a1c 6.1  10-29-19: wbc 6.5; hgb 10.2; hct 34.5; mcv 84.8 plt 322; glucose 112; bun 21; creat 0.89; k+ 3.8; na++ 143; ca 9.0 liver normal albumin 3.1 hgb a1c 6.4  10-31-19: guaiac: + and - 11-05-19: hgb 9.8; hct 31.8 11-09-19: guaiac +  02-05-20: urine micro-albumin: 161.6   NO NEW LABS   Review of Systems  Unable to perform ROS: Dementia (unable to participate )    Physical Exam Constitutional:      General: She is not in acute distress.    Appearance: She is well-developed. She is not diaphoretic.  Neck:     Thyroid: No thyromegaly.  Cardiovascular:     Rate and Rhythm: Normal rate and regular rhythm.     Pulses: Normal pulses.     Heart sounds: Normal heart sounds.  Pulmonary:     Effort: Pulmonary effort is normal. No respiratory distress.     Breath sounds: Normal breath sounds.  Abdominal:     General: Bowel sounds are normal. There is no distension.     Palpations: Abdomen is soft.     Tenderness: There is no abdominal tenderness.  Musculoskeletal:        General: Normal range of motion.     Cervical back: Neck supple.     Right lower leg: No edema.     Left lower leg: No edema.  Lymphadenopathy:     Cervical: No cervical adenopathy.  Skin:    General: Skin is warm and dry.  Neurological:     Mental Status: She is alert. Mental status is at baseline.  Psychiatric:        Mood and Affect: Mood normal.       ASSESSMENT/ PLAN:  TODAY  1. Major depression recurrent chronic She is emotionally stable Will continue celexa 20 mg daily has failed one wean  Will monitor her status.   MD is aware of resident's narcotic use and  is in agreement with current plan of care. We will attempt to wean resident as appropriate.  Synthia Innocent NP [facility]  Contact [phone] Monday through Friday 8am- 5pm  After hours call [phone]

## 2020-04-05 DIAGNOSIS — E1140 Type 2 diabetes mellitus with diabetic neuropathy, unspecified: Secondary | ICD-10-CM | POA: Diagnosis not present

## 2020-04-05 DIAGNOSIS — L603 Nail dystrophy: Secondary | ICD-10-CM | POA: Diagnosis not present

## 2020-04-05 DIAGNOSIS — Z7984 Long term (current) use of oral hypoglycemic drugs: Secondary | ICD-10-CM | POA: Diagnosis not present

## 2020-04-05 DIAGNOSIS — B351 Tinea unguium: Secondary | ICD-10-CM | POA: Diagnosis not present

## 2020-04-05 DIAGNOSIS — E1151 Type 2 diabetes mellitus with diabetic peripheral angiopathy without gangrene: Secondary | ICD-10-CM | POA: Diagnosis not present

## 2020-04-23 ENCOUNTER — Non-Acute Institutional Stay (SKILLED_NURSING_FACILITY): Payer: Medicare Other | Admitting: Adult Health

## 2020-04-23 ENCOUNTER — Encounter: Payer: Self-pay | Admitting: Adult Health

## 2020-04-23 DIAGNOSIS — D500 Iron deficiency anemia secondary to blood loss (chronic): Secondary | ICD-10-CM

## 2020-04-23 DIAGNOSIS — J3089 Other allergic rhinitis: Secondary | ICD-10-CM

## 2020-04-23 DIAGNOSIS — I10 Essential (primary) hypertension: Secondary | ICD-10-CM

## 2020-04-23 DIAGNOSIS — I6930 Unspecified sequelae of cerebral infarction: Secondary | ICD-10-CM

## 2020-04-23 DIAGNOSIS — K219 Gastro-esophageal reflux disease without esophagitis: Secondary | ICD-10-CM

## 2020-04-23 DIAGNOSIS — F0390 Unspecified dementia without behavioral disturbance: Secondary | ICD-10-CM

## 2020-04-23 DIAGNOSIS — Z8673 Personal history of transient ischemic attack (TIA), and cerebral infarction without residual deficits: Secondary | ICD-10-CM

## 2020-04-23 DIAGNOSIS — F339 Major depressive disorder, recurrent, unspecified: Secondary | ICD-10-CM

## 2020-04-23 DIAGNOSIS — R1312 Dysphagia, oropharyngeal phase: Secondary | ICD-10-CM | POA: Diagnosis not present

## 2020-04-23 NOTE — Progress Notes (Signed)
Location:    [facility] Room Number: 115/W Place of Service:  SNF (31)   CODE STATUS: DNR  No Known Allergies  Chief Complaint  Patient presents with  . Annual Exam    Annual Exam    HPI:  She is a 86 year old long term resident of this facility being seen for her annual exam. She has not required any hospitalization over the past year. She has not had any significant events. Her weight is without significant change. No recent falls. There are no reports of uncontrolled pain; no reports of agitation; no reports of constipation or heart burn. She continues to be followed for her chronic illnesses including: Dementia without behavioral disturbances unspecified dementia type:    Essential hypertension:  Dysphagia oropharyngeal phase  Past Medical History:  Diagnosis Date  . Asthma   . Dementia ([facility])   . Diabetes mellitus   . H. pylori infection MAR 2014  . Hypertension   . Stroke [facility])     Past Surgical History:  Procedure Laterality Date  . CHOLECYSTECTOMY    . COLECTOMY     secondary to diverticulitis  . ESOPHAGOGASTRODUODENOSCOPY (EGD) WITH ESOPHAGEAL DILATION N/A 01/07/2013   GYI:[id number] ring was found at the gastroesophageal junction/SINGLE DUODENAL DIVERTICULUM    Social History   Socioeconomic History  . Marital status: Widowed    Spouse name: Not on file  . Number of children: Not on file  . Years of education: Not on file  . Highest education level: Not on file  Occupational History  . Occupation: retired   Tobacco Use  . Smoking status: Never Smoker  . Smokeless tobacco: Never Used  Vaping Use  . Vaping Use: Never used  Substance and Sexual Activity  . Alcohol use: No  . Drug use: No  . Sexual activity: Not Currently  Other Topics Concern  . Not on file  Social History Narrative   Is a long term resident of [facility]    Social Determinants of Health   Financial Resource Strain:   . Difficulty of Paying Living Expenses:   Food  Insecurity:   . Worried About Programme researcher, broadcasting/film/video in the Last Year:   . Barista in the Last Year:   Transportation Needs:   . Freight forwarder (Medical):   Marland Kitchen Lack of Transportation (Non-Medical):   Physical Activity:   . Days of Exercise per Week:   . Minutes of Exercise per Session:   Stress: No Stress Concern Present  . Feeling of Stress : Not at all  Social Connections:   . Frequency of Communication with Friends and Family:   . Frequency of Social Gatherings with Friends and Family:   . Attends Religious Services:   . Active Member of Clubs or Organizations:   . Attends Banker Meetings:   Marland Kitchen Marital Status:   Intimate Partner Violence:   . Fear of Current or Ex-Partner:   . Emotionally Abused:   Marland Kitchen Physically Abused:   . Sexually Abused:    Family History  Problem Relation Age of Onset  . Hypertension Mother   . Colon cancer Neg Hx       VITAL SIGNS BP 119/63   Pulse 78   Temp 98.1 F (36.7 C) (Oral)   Resp 16   Ht 5\' 5"  (1.651 m)   Wt 106 lb 12.8 oz (48.4 kg)   SpO2 95%   BMI 17.77 kg/m   Outpatient Encounter  Medications as of 04/23/2020  Medication Sig  . Balsam [place]-Castor Oil (VENELEX) OINT Apply to sacrum and bilateral buttocks q shift & prn every shift  . citalopram (CELEXA) 10 MG tablet Take 20 mg by mouth at bedtime.   Marland Kitchen denosumab (PROLIA) 60 MG/ML SOLN injection Inject 60 mg into the skin every 6 (six) months. Administer in upper arm, thigh, or abdomen  . guaiFENesin-dextromethorphan (ROBITUSSIN DM) 100-10 MG/5ML syrup Take 15 mLs by mouth every 6 (six) hours as needed for cough.  . loratadine (CLARITIN) 10 MG tablet Take 10 mg by mouth daily.  . memantine (NAMENDA) 10 MG tablet Take 10 mg by mouth 2 (two) times daily.  . NON FORMULARY Diet Order: Dysphagia 1 (puree), continue honey thick liquids. No sausage, no gravy on meat - gravy in bowls.  . NON FORMULARY Take 1 each by mouth 3 (three) times daily between meals. Magic Cup    . omeprazole (PRILOSEC) 40 MG capsule Take 40 mg by mouth 2 (two) times daily.   . ondansetron (ZOFRAN) 4 MG tablet Take 4 mg by mouth 3 (three) times daily before meals.    No facility-administered encounter medications on file as of 04/23/2020.     SIGNIFICANT DIAGNOSTIC EXAMS  LABS REVIEWED PREVIOUS   06-12-19: wbc 8.1; hgb 13.4; hct 42.6; mcv 100;0 plt 278; 08-11-19: hgb a1c 6.1  10-29-19: wbc 6.5; hgb 10.2; hct 34.5; mcv 84.8 plt 322; glucose 112; bun 21; creat 0.89; k+ 3.8; na++ 143; ca 9.0 liver normal albumin 3.1 hgb a1c 6.4  10-31-19: guaiac: + and - 11-05-19: hgb 9.8; hct 31.8 11-09-19: guaiac +  02-05-20: urine micro-albumin: 161.6   TODAY  03-29-20; wbc 5.5; hgb 7.7; hct 26.8; mcv 77.2 plt 373; glucose 71; bun 19; creat 0.74; k+ 3.8; na++ 142; ca 8.3 liver normal albumin 3.0; hgb a1c 6.3     Review of Systems  Unable to perform ROS: Dementia (unable to participate )    Physical Exam Constitutional:      General: She is not in acute distress.    Appearance: She is underweight. She is not diaphoretic.  HENT:     Right Ear: Tympanic membrane normal.     Left Ear: Tympanic membrane normal.     Nose: Nose normal.     Mouth/Throat:     Mouth: Mucous membranes are moist.     Pharynx: Oropharynx is clear.  Eyes:     Conjunctiva/sclera: Conjunctivae normal.  Neck:     Thyroid: No thyromegaly.  Cardiovascular:     Rate and Rhythm: Normal rate and regular rhythm.     Pulses: Normal pulses.     Heart sounds: Normal heart sounds.  Pulmonary:     Effort: Pulmonary effort is normal. No respiratory distress.     Breath sounds: Normal breath sounds.  Abdominal:     General: Bowel sounds are normal. There is no distension.     Palpations: Abdomen is soft.     Tenderness: There is no abdominal tenderness.  Musculoskeletal:        General: Normal range of motion.     Cervical back: Neck supple.     Right lower leg: No edema.     Left lower leg: No edema.  Lymphadenopathy:      Cervical: No cervical adenopathy.  Skin:    General: Skin is warm and dry.  Neurological:     Mental Status: She is alert. Mental status is at baseline.  Psychiatric:  Mood and Affect: Mood normal.     ASSESSMENT/ PLAN:  TODAY  1. Dementia without behavioral disturbances unspecified dementia type: is without significant change weight is 106 pounds will continue namenda 10 mg twice daily weight loss is an unfortunate but expected outcome in the late states of this disease process.   2. Essential hypertension: is stable b/p 119/63 will continue to monitor her status.   3. Dysphagia oropharyngeal phase: no signs of aspiration present will continue honey thick liquids.   4.  Chronic CVA: is stable will continue to monitor her status.   5. Chronic non-seasonal allergic rhinitis: is stable will continue claritin 10 mg daily   6. GERD without esophagitis: is stable will continue prilosec 40 mg twice daily and zofran 4 mg prior to meals  7.  Anemia due to chronic blood loss: is without change hgb 9,8 is unable to tolerate iron; not a candidate for aggressive workup; will monitor has history of positive guaiac stooles  8. Major depression recurrent chronic is stable will continue celexa 20 mg daily has failed one wean  9. Age related osteoporosis without current pathological fracture is stable will continue prolia 60 mg every 6 months.         MD is aware of resident's narcotic use and is in agreement with current plan of care. We will attempt to wean resident as appropriate.  Synthia Innocent NP [facility]  Contact [phone] Monday through Friday 8am- 5pm  After hours call [phone]

## 2020-05-12 ENCOUNTER — Other Ambulatory Visit (HOSPITAL_COMMUNITY)
Admission: RE | Admit: 2020-05-12 | Discharge: 2020-05-12 | Disposition: A | Discharge status: Home / Self Care | Payer: Medicare Other | Source: Skilled Nursing Facility | Attending: Adult Health | Admitting: Adult Health

## 2020-05-12 ENCOUNTER — Non-Acute Institutional Stay (SKILLED_NURSING_FACILITY): Payer: Medicare Other | Admitting: Adult Health

## 2020-05-12 ENCOUNTER — Encounter: Payer: Self-pay | Admitting: Adult Health

## 2020-05-12 DIAGNOSIS — F0390 Unspecified dementia without behavioral disturbance: Secondary | ICD-10-CM | POA: Insufficient documentation

## 2020-05-12 DIAGNOSIS — R627 Adult failure to thrive: Secondary | ICD-10-CM

## 2020-05-12 LAB — COMPREHENSIVE METABOLIC PANEL
ALT: 10 U/L (ref 0–44)
AST: 14 U/L — ABNORMAL LOW (ref 15–41)
Albumin: 2.9 g/dL — ABNORMAL LOW (ref 3.5–5.0)
Alkaline Phosphatase: 57 U/L (ref 38–126)
Anion gap: 8 (ref 5–15)
BUN: 16 mg/dL (ref 8–23)
CO2: 24 mmol/L (ref 22–32)
Calcium: 8.1 mg/dL — ABNORMAL LOW (ref 8.9–10.3)
Chloride: 106 mmol/L (ref 98–111)
Creatinine, Ser: 0.73 mg/dL (ref 0.44–1.00)
GFR calc Af Amer: >60 mL/min (ref >60)
GFR calc non Af Amer: >60 mL/min (ref >60)
Glucose, Bld: 77 mg/dL (ref 70–99)
Potassium: 3.4 mmol/L — ABNORMAL LOW (ref 3.5–5.1)
Sodium: 138 mmol/L (ref 135–145)
Total Bilirubin: 0.4 mg/dL (ref 0.3–1.2)
Total Protein: 6.1 g/dL — ABNORMAL LOW (ref 6.5–8.1)

## 2020-05-12 LAB — TSH: TSH: 1.06 u[IU]/mL (ref 0.350–4.500)

## 2020-05-12 NOTE — Progress Notes (Signed)
Location:    [facility] Room Number: 115/W Place of Service:  SNF (31)   CODE STATUS: DNR  No Known Allergies  Chief Complaint  Patient presents with  . Acute Visit    Weight Loss    HPI:  She has been losing weight on a gradual consistent basis: July 2020: 121 pounds; Jan 2021: 118 pounds; May 2021: 108 pounds current weight of 106.8 pounds. Her albumin is 2.9 which is down from 3.1 6 months ago. Her appetite is variable. There are no reports of pain; no report of agitation. No reports of excessive fatigue.    Past Medical History:  Diagnosis Date  . Asthma   . Dementia ([facility])   . Diabetes mellitus   . H. pylori infection MAR 2014  . Hypertension   . Stroke [facility])     Past Surgical History:  Procedure Laterality Date  . CHOLECYSTECTOMY    . COLECTOMY     secondary to diverticulitis  . ESOPHAGOGASTRODUODENOSCOPY (EGD) WITH ESOPHAGEAL DILATION N/A 01/07/2013   [id number] ring was found at the gastroesophageal junction/SINGLE DUODENAL DIVERTICULUM    Social History   Socioeconomic History  . Marital status: Widowed    Spouse name: Not on file  . Number of children: Not on file  . Years of education: Not on file  . Highest education level: Not on file  Occupational History  . Occupation: retired   Tobacco Use  . Smoking status: Never Smoker  . Smokeless tobacco: Never Used  Vaping Use  . Vaping Use: Never used  Substance and Sexual Activity  . Alcohol use: No  . Drug use: No  . Sexual activity: Not Currently  Other Topics Concern  . Not on file  Social History Narrative   Is a long term resident of [facility]    Social Determinants of Health   Financial Resource Strain:   . Difficulty of Paying Living Expenses:   Food Insecurity:   . Worried About Programme researcher, broadcasting/film/video in the Last Year:   . Barista in the Last Year:   Transportation Needs:   . Freight forwarder (Medical):   Marland Kitchen Lack of Transportation (Non-Medical):     Physical Activity:   . Days of Exercise per Week:   . Minutes of Exercise per Session:   Stress:   . Feeling of Stress :   Social Connections:   . Frequency of Communication with Friends and Family:   . Frequency of Social Gatherings with Friends and Family:   . Attends Religious Services:   . Active Member of Clubs or Organizations:   . Attends Banker Meetings:   Marland Kitchen Marital Status:   Intimate Partner Violence:   . Fear of Current or Ex-Partner:   . Emotionally Abused:   Marland Kitchen Physically Abused:   . Sexually Abused:    Family History  Problem Relation Age of Onset  . Hypertension Mother   . Colon cancer Neg Hx       VITAL SIGNS BP (!) 140/70   Pulse 87   Temp 97.7 F (36.5 C) (Oral)   Resp 16   Ht 5\' 5"  (1.651 m)   Wt 106 lb 12.8 oz (48.4 kg)   SpO2 95%   BMI 17.77 kg/m   Outpatient Encounter Medications as of 05/12/2020  Medication Sig  . Balsam [place]-Castor Oil (VENELEX) OINT Apply to sacrum and bilateral buttocks q shift & prn every shift  . citalopram (CELEXA) 10  MG tablet Take 20 mg by mouth at bedtime.   Marland Kitchen denosumab (PROLIA) 60 MG/ML SOLN injection Inject 60 mg into the skin every 6 (six) months. Administer in upper arm, thigh, or abdomen  . guaiFENesin-dextromethorphan (ROBITUSSIN DM) 100-10 MG/5ML syrup Take 15 mLs by mouth every 6 (six) hours as needed for cough.  . loratadine (CLARITIN) 10 MG tablet Take 10 mg by mouth daily.  . memantine (NAMENDA) 10 MG tablet Take 10 mg by mouth 2 (two) times daily.  . NON FORMULARY Diet Order: Dysphagia 1 (puree), continue honey thick liquids. No sausage, no gravy on meat - gravy in bowls.  . NON FORMULARY Magic Cup TID daily due to unplanned weight loss Three Times A Day Between Meals 10:00 AM, 02:00 PM, 08:00 PM  . omeprazole (PRILOSEC) 40 MG capsule Take 40 mg by mouth 2 (two) times daily.   . ondansetron (ZOFRAN) 4 MG tablet Take 4 mg by mouth 3 (three) times daily before meals.    No  facility-administered encounter medications on file as of 05/12/2020.     SIGNIFICANT DIAGNOSTIC EXAMS   LABS REVIEWED PREVIOUS   06-12-19: wbc 8.1; hgb 13.4; hct 42.6; mcv 100;0 plt 278; 08-11-19: hgb a1c 6.1  10-29-19: wbc 6.5; hgb 10.2; hct 34.5; mcv 84.8 plt 322; glucose 112; bun 21; creat 0.89; k+ 3.8; na++ 143; ca 9.0 liver normal albumin 3.1 hgb a1c 6.4  10-31-19: guaiac: + and - 11-05-19: hgb 9.8; hct 31.8 11-09-19: guaiac +  02-05-20: urine micro-albumin: 161.6  03-29-20; wbc 5.5; hgb 7.7; hct 26.8; mcv 77.2 plt 373; glucose 71; bun 19; creat 0.74; k+ 3.8; na++ 142; ca 8.3 liver normal albumin 3.0; hgb a1c 6.3    TODAY  05-12-20: glucose 77; bun 16; creat 0.73; k+ 3.4; na++ 138; ca 8.1 liver normal albumin 2.9; tsh 1.060   Review of Systems  Unable to perform ROS: Dementia (unable to participate )   Physical Exam Constitutional:      General: She is not in acute distress.    Appearance: She is underweight. She is not diaphoretic.  Neck:     Thyroid: No thyromegaly.  Cardiovascular:     Rate and Rhythm: Normal rate and regular rhythm.     Pulses: Normal pulses.     Heart sounds: Normal heart sounds.  Pulmonary:     Effort: Pulmonary effort is normal. No respiratory distress.     Breath sounds: Normal breath sounds.  Abdominal:     General: Bowel sounds are normal. There is no distension.     Palpations: Abdomen is soft.     Tenderness: There is no abdominal tenderness.  Musculoskeletal:        General: Normal range of motion.     Cervical back: Neck supple.     Right lower leg: No edema.     Left lower leg: No edema.  Lymphadenopathy:     Cervical: No cervical adenopathy.  Skin:    General: Skin is warm and dry.  Neurological:     Mental Status: She is alert. Mental status is at baseline.  Psychiatric:        Mood and Affect: Mood normal.       ASSESSMENT/ PLAN:  TODAY  1. Dementia without behavioral disturbance unspecified dementia type 2. Failure to  thrive in adult  Will begin remeron 7.5 mg nightly through 06-09-20  Will monitor for better appetite.  Unfortunately weight loss is an expected outcome in the late stages of this disease  process.    MD is aware of resident's narcotic use and is in agreement with current plan of care. We will attempt to wean resident as appropriate.  Synthia Innocent NP [facility] Adult Medicine  Contact [phone] Monday through Friday 8am- 5pm  After hours call [phone]

## 2020-05-13 ENCOUNTER — Non-Acute Institutional Stay (SKILLED_NURSING_FACILITY): Payer: Medicare Other | Admitting: Adult Health

## 2020-05-13 ENCOUNTER — Encounter: Payer: Self-pay | Admitting: Adult Health

## 2020-05-13 DIAGNOSIS — R1312 Dysphagia, oropharyngeal phase: Secondary | ICD-10-CM

## 2020-05-13 DIAGNOSIS — F0390 Unspecified dementia without behavioral disturbance: Secondary | ICD-10-CM | POA: Diagnosis not present

## 2020-05-13 DIAGNOSIS — I6930 Unspecified sequelae of cerebral infarction: Secondary | ICD-10-CM | POA: Diagnosis not present

## 2020-05-13 NOTE — Progress Notes (Signed)
Location:    [facility] Room Number: 115/W Place of Service:  SNF (31)   CODE STATUS: DNR  No Known Allergies  Chief Complaint  Patient presents with   Acute Visit    Care Plan Meeting    HPI:  We have come together for her care plan meeting. BIMS 6/15 mood 8/30. Family present. No reports of falls. Her weight is presently stable she failed coming off remeron for her appetite. She requires extensive to total care for her adls. She is incontinent of bowel and bladder. There are no reports of uncontrolled pain present. We have discussed her advanced directives via phone. Her family does not want cpr or tube feeding; but want everything else to be done. Will mail her family the most form to sign and return. She will continue to be followed for her chronic illnesses including: Dementia without behavioral disturbance unspecified dementia type  Chronic cerebrovascular accident  Dysphagia oropharyngeal phase   Past Medical History:  Diagnosis Date   Asthma    Dementia (HCC)    Diabetes mellitus    H. pylori infection MAR 2014   Hypertension    Stroke [facility])     Past Surgical History:  Procedure Laterality Date   CHOLECYSTECTOMY     COLECTOMY     secondary to diverticulitis   ESOPHAGOGASTRODUODENOSCOPY (EGD) WITH ESOPHAGEAL DILATION N/A 01/07/2013   [id number] ring was found at the gastroesophageal junction/SINGLE DUODENAL DIVERTICULUM    Social History   Socioeconomic History   Marital status: Widowed    Spouse name: Not on file   Number of children: Not on file   Years of education: Not on file   Highest education level: Not on file  Occupational History   Occupation: retired   Tobacco Use   Smoking status: Never Smoker   Smokeless tobacco: Never Used  Building services engineer Use: Never used  Substance and Sexual Activity   Alcohol use: No   Drug use: No   Sexual activity: Not Currently  Other Topics Concern   Not on file    Social History Narrative   Is a long term resident of [facility]    Social Determinants of Health   Financial Resource Strain:    Difficulty of Paying Living Expenses:   Food Insecurity:    Worried About Programme researcher, broadcasting/film/video in the Last Year:    Barista in the Last Year:   Transportation Needs:    Freight forwarder (Medical):    Lack of Transportation (Non-Medical):   Physical Activity:    Days of Exercise per Week:    Minutes of Exercise per Session:   Stress:    Feeling of Stress :   Social Connections:    Frequency of Communication with Friends and Family:    Frequency of Social Gatherings with Friends and Family:    Attends Religious Services:    Active Member of Clubs or Organizations:    Attends Engineer, structural:    Marital Status:   Intimate Partner Violence:    Fear of Current or Ex-Partner:    Emotionally Abused:    Physically Abused:    Sexually Abused:    Family History  Problem Relation Age of Onset   Hypertension Mother    Colon cancer Neg Hx       VITAL SIGNS BP (!) 140/70    Pulse 87    Temp 98.1 F (36.7 C) (Oral)  Resp 16    Ht 5\' 5"  (1.651 m)    Wt 106 lb 12.8 oz (48.4 kg)    SpO2 95%    BMI 17.77 kg/m   Outpatient Encounter Medications as of 05/13/2020  Medication Sig   Balsam [place]-Castor Oil (VENELEX) OINT Apply to sacrum and bilateral buttocks q shift & prn every shift   citalopram (CELEXA) 10 MG tablet Take 20 mg by mouth at bedtime.    denosumab (PROLIA) 60 MG/ML SOLN injection Inject 60 mg into the skin every 6 (six) months. Administer in upper arm, thigh, or abdomen   guaiFENesin-dextromethorphan (ROBITUSSIN DM) 100-10 MG/5ML syrup Take 15 mLs by mouth every 6 (six) hours as needed for cough.   loratadine (CLARITIN) 10 MG tablet Take 10 mg by mouth daily.   memantine (NAMENDA) 10 MG tablet Take 10 mg by mouth 2 (two) times daily.   mirtazapine (REMERON) 7.5 MG tablet Take 7.5 mg by mouth at  bedtime. For Appetite   NON FORMULARY Diet Order: Dysphagia 1 (puree), continue honey thick liquids. No sausage, no gravy on meat - gravy in bowls.   NON FORMULARY Magic Cup TID daily due to unplanned weight loss Three Times A Day Between Meals 10:00 AM, 02:00 PM, 08:00 PM   omeprazole (PRILOSEC) 40 MG capsule Take 40 mg by mouth 2 (two) times daily.    ondansetron (ZOFRAN) 4 MG tablet Take 4 mg by mouth 3 (three) times daily before meals.    No facility-administered encounter medications on file as of 05/13/2020.     SIGNIFICANT DIAGNOSTIC EXAMS   LABS REVIEWED PREVIOUS   06-12-19: wbc 8.1; hgb 13.4; hct 42.6; mcv 100;0 plt 278; 08-11-19: hgb a1c 6.1  10-29-19: wbc 6.5; hgb 10.2; hct 34.5; mcv 84.8 plt 322; glucose 112; bun 21; creat 0.89; k+ 3.8; na++ 143; ca 9.0 liver normal albumin 3.1 hgb a1c 6.4  10-31-19: guaiac: + and - 11-05-19: hgb 9.8; hct 31.8 11-09-19: guaiac +  02-05-20: urine micro-albumin: 161.6  03-29-20; wbc 5.5; hgb 7.7; hct 26.8; mcv 77.2 plt 373; glucose 71; bun 19; creat 0.74; k+ 3.8; na++ 142; ca 8.3 liver normal albumin 3.0; hgb a1c 6.3   05-12-20: glucose 77; bun 16; creat 0.73; k+ 3.4; na++ 138; ca 8.1 liver normal albumin 2.9; tsh 1.060  NO NEW LABS.   Review of Systems  Unable to perform ROS: Dementia (unable to participate )    Physical Exam Constitutional:      General: She is not in acute distress.    Appearance: She is underweight. She is not diaphoretic.  Neck:     Thyroid: No thyromegaly.  Cardiovascular:     Rate and Rhythm: Normal rate and regular rhythm.     Heart sounds: Normal heart sounds.  Pulmonary:     Effort: Pulmonary effort is normal. No respiratory distress.     Breath sounds: Normal breath sounds.  Abdominal:     General: Bowel sounds are normal. There is no distension.     Palpations: Abdomen is soft.     Tenderness: There is no abdominal tenderness.  Musculoskeletal:        General: Normal range of motion.     Right lower  leg: No edema.     Left lower leg: No edema.  Lymphadenopathy:     Cervical: No cervical adenopathy.  Skin:    General: Skin is warm and dry.  Neurological:     Mental Status: She is alert. Mental status is at baseline.  Psychiatric:        Mood and Affect: Mood normal.        ASSESSMENT/ PLAN:  TODAY  1. Dementia without behavioral disturbance unspecified dementia type 2. Chronic cerebrovascular accident 3. Dysphagia oropharyngeal phase  Will continue current medications Will continue current plan of care Will continue to monitor her status.  MOST Form filled out and mailed to family for signature  Time spent with patient and family: 40 minutes: 25 minutes spent with advanced directives; they desire for everything to be done except for cpr and tube feeding.   MD is aware of resident's narcotic use and is in agreement with current plan of care. We will attempt to wean resident as appropriate.  Synthia Innocent NP [facility]  Contact [phone] Monday through Friday 8am- 5pm  After hours call [phone]

## 2020-05-25 ENCOUNTER — Non-Acute Institutional Stay (SKILLED_NURSING_FACILITY): Payer: Medicare Other | Admitting: Adult Health

## 2020-05-25 ENCOUNTER — Encounter: Payer: Self-pay | Admitting: Adult Health

## 2020-05-25 DIAGNOSIS — J3089 Other allergic rhinitis: Secondary | ICD-10-CM | POA: Diagnosis not present

## 2020-05-25 DIAGNOSIS — K219 Gastro-esophageal reflux disease without esophagitis: Secondary | ICD-10-CM

## 2020-05-25 DIAGNOSIS — I6930 Unspecified sequelae of cerebral infarction: Secondary | ICD-10-CM | POA: Diagnosis not present

## 2020-05-25 NOTE — Progress Notes (Signed)
Location:    [facility] Room Number: 115/W Place of Service:  SNF (31)   CODE STATUS: DNR  No Known Allergies  Chief Complaint  Patient presents with  . Medical Management of Chronic Issues         Chronic CVA:  Chronic non-seasonal allergic rhinitis:  GERD without esophagitis:    HPI:  She is a 86 year old long term resident of this facility being seen for the management of her chronic illnesses:Chronic CVA:   Chronic non-seasonal allergic rhinitis:   GERD without esophagitis: . There are no reports of uncontrolled pain; no reports of heart burn or nausea; no reports of congestion or cough.   Past Medical History:  Diagnosis Date  . Asthma   . Dementia (HCC)   . Diabetes mellitus   . H. pylori infection MAR 2014  . Hypertension   . Stroke [facility])     Past Surgical History:  Procedure Laterality Date  . CHOLECYSTECTOMY    . COLECTOMY     secondary to diverticulitis  . ESOPHAGOGASTRODUODENOSCOPY (EGD) WITH ESOPHAGEAL DILATION N/A 01/07/2013   [id number] ring was found at the gastroesophageal junction/SINGLE DUODENAL DIVERTICULUM    Social History   Socioeconomic History  . Marital status: Widowed    Spouse name: Not on file  . Number of children: Not on file  . Years of education: Not on file  . Highest education level: Not on file  Occupational History  . Occupation: retired   Tobacco Use  . Smoking status: Never Smoker  . Smokeless tobacco: Never Used  Vaping Use  . Vaping Use: Never used  Substance and Sexual Activity  . Alcohol use: No  . Drug use: No  . Sexual activity: Not Currently  Other Topics Concern  . Not on file  Social History Narrative   Is a long term resident of [facility]    Social Determinants of Health   Financial Resource Strain:   . Difficulty of Paying Living Expenses:   Food Insecurity:   . Worried About Programme researcher, broadcasting/film/video in the Last Year:   . Barista in the Last Year:   Transportation Needs:   .  Freight forwarder (Medical):   Marland Kitchen Lack of Transportation (Non-Medical):   Physical Activity:   . Days of Exercise per Week:   . Minutes of Exercise per Session:   Stress:   . Feeling of Stress :   Social Connections:   . Frequency of Communication with Friends and Family:   . Frequency of Social Gatherings with Friends and Family:   . Attends Religious Services:   . Active Member of Clubs or Organizations:   . Attends Banker Meetings:   Marland Kitchen Marital Status:   Intimate Partner Violence:   . Fear of Current or Ex-Partner:   . Emotionally Abused:   Marland Kitchen Physically Abused:   . Sexually Abused:    Family History  Problem Relation Age of Onset  . Hypertension Mother   . Colon cancer Neg Hx       VITAL SIGNS BP 106/65   Pulse 86   Temp 97.9 F (36.6 C) (Oral)   Resp 18   Ht 5\' 5"  (1.651 m)   Wt 106 lb (48.1 kg)   BMI 17.64 kg/m   Outpatient Encounter Medications as of 05/25/2020  Medication Sig  . Balsam Peru-Castor Oil (VENELEX) OINT Apply to sacrum and bilateral buttocks q shift & prn every shift  .  citalopram (CELEXA) 10 MG tablet Take 20 mg by mouth at bedtime.   Marland Kitchen denosumab (PROLIA) 60 MG/ML SOLN injection Inject 60 mg into the skin every 6 (six) months. Administer in upper arm, thigh, or abdomen  . guaiFENesin-dextromethorphan (ROBITUSSIN DM) 100-10 MG/5ML syrup Take 15 mLs by mouth every 6 (six) hours as needed for cough.  . loratadine (CLARITIN) 10 MG tablet Take 10 mg by mouth daily.  . memantine (NAMENDA) 10 MG tablet Take 10 mg by mouth 2 (two) times daily.  . mirtazapine (REMERON) 7.5 MG tablet Take 7.5 mg by mouth at bedtime. For Appetite  . NON FORMULARY Diet Order: Dysphagia 1 (puree), continue honey thick liquids. No sausage, no gravy on meat - gravy in bowls.  . NON FORMULARY Magic Cup TID daily due to unplanned weight loss Three Times A Day Between Meals 10:00 AM, 02:00 PM, 08:00 PM  . omeprazole (PRILOSEC) 40 MG capsule Take 40 mg by mouth 2  (two) times daily.   . ondansetron (ZOFRAN) 4 MG tablet Take 4 mg by mouth 3 (three) times daily before meals.    No facility-administered encounter medications on file as of 05/25/2020.     SIGNIFICANT DIAGNOSTIC EXAMS   LABS REVIEWED PREVIOUS   06-12-19: wbc 8.1; hgb 13.4; hct 42.6; mcv 100;0 plt 278; 08-11-19: hgb a1c 6.1  10-29-19: wbc 6.5; hgb 10.2; hct 34.5; mcv 84.8 plt 322; glucose 112; bun 21; creat 0.89; k+ 3.8; na++ 143; ca 9.0 liver normal albumin 3.1 hgb a1c 6.4  10-31-19: guaiac: + and - 11-05-19: hgb 9.8; hct 31.8 11-09-19: guaiac +  02-05-20: urine micro-albumin: 161.6  03-29-20; wbc 5.5; hgb 7.7; hct 26.8; mcv 77.2 plt 373; glucose 71; bun 19; creat 0.74; k+ 3.8; na++ 142; ca 8.3 liver normal albumin 3.0; hgb a1c 6.3    TODAY  05-12-20: glucose 77; bun 16; creat 0.73; k+ 3.4; na++ 138; ca 9.1 liver normal albumin 2.9 tsh 1.060    Review of Systems  Unable to perform ROS: Dementia (unable to participate )   Physical Exam Constitutional:      General: She is not in acute distress.    Appearance: She is underweight. She is not diaphoretic.  Neck:     Thyroid: No thyromegaly.  Cardiovascular:     Rate and Rhythm: Normal rate and regular rhythm.     Pulses: Normal pulses.     Heart sounds: Normal heart sounds.  Pulmonary:     Effort: Pulmonary effort is normal. No respiratory distress.     Breath sounds: Normal breath sounds.  Abdominal:     General: Bowel sounds are normal. There is no distension.     Palpations: Abdomen is soft.     Tenderness: There is no abdominal tenderness.  Musculoskeletal:        General: Normal range of motion.     Cervical back: Neck supple.     Right lower leg: No edema.     Left lower leg: No edema.  Lymphadenopathy:     Cervical: No cervical adenopathy.  Skin:    General: Skin is warm and dry.  Neurological:     Mental Status: She is alert. Mental status is at baseline.  Psychiatric:        Mood and Affect: Mood normal.      ASSESSMENT/ PLAN:  TODAY  1. Chronic CVA: is stable will continue to monitor her status.   2. Chronic non-seasonal allergic rhinitis: is stable will continue clairitin 10 mg daily  3. GERD without esophagitis: is stable will continue prilosec 40 mg twice daily and zofran 4 mg prior to meals.   PREVIOUS  4.  Anemia due to chronic blood loss: is without change hgb 9,8 is unable to tolerate iron; not a candidate for aggressive workup; will monitor has history of positive guaiac stooles  5. Major depression recurrent chronic is stable will continue celexa 20 mg daily has failed one wean  6. Age related osteoporosis without current pathological fracture is stable will continue prolia 60 mg every 6 months.   7. Dementia without behavioral disturbances unspecified dementia type: is without significant change weight is 106 pounds will continue namenda 10 mg twice daily weight loss is an unfortunate but expected outcome in the late states of this disease process.   8. Essential hypertension: is stable b/p 119/63 will continue to monitor her status.   9. Dysphagia oropharyngeal phase: no signs of aspiration present will continue honey thick liquids.      MD is aware of resident's narcotic use and is in agreement with current plan of care. We will attempt to wean resident as appropriate.  Synthia Innocent NP [facility]  Contact [phone] Monday through Friday 8am- 5pm  After hours call [phone]

## 2020-05-28 ENCOUNTER — Encounter: Payer: Self-pay | Admitting: Adult Health

## 2020-05-28 ENCOUNTER — Non-Acute Institutional Stay (SKILLED_NURSING_FACILITY): Payer: Medicare Other | Admitting: Adult Health

## 2020-05-28 DIAGNOSIS — J3089 Other allergic rhinitis: Secondary | ICD-10-CM | POA: Diagnosis not present

## 2020-05-28 NOTE — Progress Notes (Signed)
Location:    [facility] Room Number: 115/W Place of Service:  SNF (31)   CODE STATUS: DNR  No Known Allergies  Chief Complaint  Patient presents with  . Acute Visit    Cough and Congestion     HPI:  Staff report that for the past 2-3 days she has had a congested cough. There is no sputum production and no fevers; no changes in appetite or fluid intake. She has been treated with robitussin without much relief. She does have a history of aspiration.   Past Medical History:  Diagnosis Date  . Asthma   . Dementia (HCC)   . Diabetes mellitus   . H. pylori infection MAR 2014  . Hypertension   . Stroke [facility])     Past Surgical History:  Procedure Laterality Date  . CHOLECYSTECTOMY    . COLECTOMY     secondary to diverticulitis  . ESOPHAGOGASTRODUODENOSCOPY (EGD) WITH ESOPHAGEAL DILATION N/A 01/07/2013   [id number] ring was found at the gastroesophageal junction/SINGLE DUODENAL DIVERTICULUM    Social History   Socioeconomic History  . Marital status: Widowed    Spouse name: Not on file  . Number of children: Not on file  . Years of education: Not on file  . Highest education level: Not on file  Occupational History  . Occupation: retired   Tobacco Use  . Smoking status: Never Smoker  . Smokeless tobacco: Never Used  Vaping Use  . Vaping Use: Never used  Substance and Sexual Activity  . Alcohol use: No  . Drug use: No  . Sexual activity: Not Currently  Other Topics Concern  . Not on file  Social History Narrative   Is a long term resident of [facility]    Social Determinants of Health   Financial Resource Strain:   . Difficulty of Paying Living Expenses:   Food Insecurity:   . Worried About Programme researcher, broadcasting/film/video in the Last Year:   . Barista in the Last Year:   Transportation Needs:   . Freight forwarder (Medical):   Marland Kitchen Lack of Transportation (Non-Medical):   Physical Activity:   . Days of Exercise per Week:   . Minutes of  Exercise per Session:   Stress:   . Feeling of Stress :   Social Connections:   . Frequency of Communication with Friends and Family:   . Frequency of Social Gatherings with Friends and Family:   . Attends Religious Services:   . Active Member of Clubs or Organizations:   . Attends Banker Meetings:   Marland Kitchen Marital Status:   Intimate Partner Violence:   . Fear of Current or Ex-Partner:   . Emotionally Abused:   Marland Kitchen Physically Abused:   . Sexually Abused:    Family History  Problem Relation Age of Onset  . Hypertension Mother   . Colon cancer Neg Hx       VITAL SIGNS BP 106/65   Pulse 86   Temp 98.2 F (36.8 C) (Oral)   Resp 18   Ht 5\' 5"  (1.651 m)   Wt 106 lb (48.1 kg)   BMI 17.64 kg/m   Outpatient Encounter Medications as of 05/28/2020  Medication Sig  . Balsam [place]-Castor Oil (VENELEX) OINT Apply to sacrum and bilateral buttocks q shift & prn every shift  . citalopram (CELEXA) 10 MG tablet Take 20 mg by mouth at bedtime.   05/30/2020 denosumab (PROLIA) 60 MG/ML SOLN injection Inject 60  mg into the skin every 6 (six) months. Administer in upper arm, thigh, or abdomen  . guaiFENesin-dextromethorphan (ROBITUSSIN DM) 100-10 MG/5ML syrup Take 15 mLs by mouth every 6 (six) hours as needed for cough.  . loratadine (CLARITIN) 10 MG tablet Take 10 mg by mouth daily.  . memantine (NAMENDA) 10 MG tablet Take 10 mg by mouth 2 (two) times daily.  . mirtazapine (REMERON) 7.5 MG tablet Take 7.5 mg by mouth at bedtime. For Appetite  . NON FORMULARY Diet Order: Dysphagia 1 (puree), continue honey thick liquids. No sausage, no gravy on meat - gravy in bowls.  . NON FORMULARY Magic Cup TID daily due to unplanned weight loss Three Times A Day Between Meals 10:00 AM, 02:00 PM, 08:00 PM  . omeprazole (PRILOSEC) 40 MG capsule Take 40 mg by mouth 2 (two) times daily.   . ondansetron (ZOFRAN) 4 MG tablet Take 4 mg by mouth 3 (three) times daily before meals.    No facility-administered  encounter medications on file as of 05/28/2020.     SIGNIFICANT DIAGNOSTIC EXAMS  LABS REVIEWED PREVIOUS   06-12-19: wbc 8.1; hgb 13.4; hct 42.6; mcv 100;0 plt 278; 08-11-19: hgb a1c 6.1  10-29-19: wbc 6.5; hgb 10.2; hct 34.5; mcv 84.8 plt 322; glucose 112; bun 21; creat 0.89; k+ 3.8; na++ 143; ca 9.0 liver normal albumin 3.1 hgb a1c 6.4  10-31-19: guaiac: + and - 11-05-19: hgb 9.8; hct 31.8 11-09-19: guaiac +  02-05-20: urine micro-albumin: 161.6  03-29-20; wbc 5.5; hgb 7.7; hct 26.8; mcv 77.2 plt 373; glucose 71; bun 19; creat 0.74; k+ 3.8; na++ 142; ca 8.3 liver normal albumin 3.0; hgb a1c 6.3   05-12-20: glucose 77; bun 16; creat 0.73; k+ 3.4; na++ 138; ca 9.1 liver normal albumin 2.9 tsh 1.060  NO NEW LABS.   Review of Systems  Unable to perform ROS: Dementia (unable to participate )    Physical Exam Constitutional:      General: She is not in acute distress.    Appearance: She is well-developed. She is not diaphoretic.  HENT:     Nose: Nose normal.     Mouth/Throat:     Mouth: Mucous membranes are moist.     Pharynx: Oropharynx is clear.  Eyes:     Conjunctiva/sclera: Conjunctivae normal.  Neck:     Thyroid: No thyromegaly.  Cardiovascular:     Rate and Rhythm: Normal rate and regular rhythm.     Pulses: Normal pulses.     Heart sounds: Normal heart sounds.  Pulmonary:     Effort: Pulmonary effort is normal. No respiratory distress.     Breath sounds: Normal breath sounds.  Abdominal:     General: Bowel sounds are normal. There is no distension.     Palpations: Abdomen is soft.     Tenderness: There is no abdominal tenderness.  Musculoskeletal:        General: Normal range of motion.     Cervical back: Neck supple. No tenderness.     Right lower leg: No edema.     Left lower leg: No edema.  Lymphadenopathy:     Cervical: No cervical adenopathy.  Skin:    General: Skin is warm and dry.  Neurological:     Mental Status: She is alert. Mental status is at baseline.    Psychiatric:        Mood and Affect: Mood normal.        ASSESSMENT/ PLAN:  TODAY  1. Chronic non-seasonal  allergic rhinitis  Is worse Will begin duoneb every 6 hours through 06-04-20 will monitor her status   MD is aware of resident's narcotic use and is in agreement with current plan of care. We will attempt to wean resident as appropriate.  Synthia Innocent NP [facility]  Contact [phone] Monday through Friday 8am- 5pm  After hours call [phone]

## 2020-05-31 ENCOUNTER — Non-Acute Institutional Stay (SKILLED_NURSING_FACILITY): Payer: Medicare Other | Admitting: Adult Health

## 2020-05-31 ENCOUNTER — Encounter: Payer: Self-pay | Admitting: Adult Health

## 2020-05-31 DIAGNOSIS — B370 Candidal stomatitis: Secondary | ICD-10-CM | POA: Diagnosis not present

## 2020-05-31 NOTE — Progress Notes (Signed)
Location:    [facility] Room Number: 115/W Place of Service:  SNF (31)   CODE STATUS: DNR  No Known Allergies  Chief Complaint  Patient presents with  . Acute Visit    Oral Thrush    HPI:  Staff reports that she has oral thrush this AM. She does have cough present without sputum. Her po intake is worse. There are no reports of fevers present.   Past Medical History:  Diagnosis Date  . Asthma   . Dementia (HCC)   . Diabetes mellitus   . H. pylori infection MAR 2014  . Hypertension   . Stroke [facility])     Past Surgical History:  Procedure Laterality Date  . CHOLECYSTECTOMY    . COLECTOMY     secondary to diverticulitis  . ESOPHAGOGASTRODUODENOSCOPY (EGD) WITH ESOPHAGEAL DILATION N/A 01/07/2013   TIR:[id number] ring was found at the gastroesophageal junction/SINGLE DUODENAL DIVERTICULUM    Social History   Socioeconomic History  . Marital status: Widowed    Spouse name: Not on file  . Number of children: Not on file  . Years of education: Not on file  . Highest education level: Not on file  Occupational History  . Occupation: retired   Tobacco Use  . Smoking status: Never Smoker  . Smokeless tobacco: Never Used  Vaping Use  . Vaping Use: Never used  Substance and Sexual Activity  . Alcohol use: No  . Drug use: No  . Sexual activity: Not Currently  Other Topics Concern  . Not on file  Social History Narrative   Is a long term resident of [facility]    Social Determinants of Health   Financial Resource Strain:   . Difficulty of Paying Living Expenses:   Food Insecurity:   . Worried About Programme researcher, broadcasting/film/video in the Last Year:   . Barista in the Last Year:   Transportation Needs:   . Freight forwarder (Medical):   Marland Kitchen Lack of Transportation (Non-Medical):   Physical Activity:   . Days of Exercise per Week:   . Minutes of Exercise per Session:   Stress:   . Feeling of Stress :   Social Connections:   . Frequency of  Communication with Friends and Family:   . Frequency of Social Gatherings with Friends and Family:   . Attends Religious Services:   . Active Member of Clubs or Organizations:   . Attends Banker Meetings:   Marland Kitchen Marital Status:   Intimate Partner Violence:   . Fear of Current or Ex-Partner:   . Emotionally Abused:   Marland Kitchen Physically Abused:   . Sexually Abused:    Family History  Problem Relation Age of Onset  . Hypertension Mother   . Colon cancer Neg Hx       VITAL SIGNS BP 109/63   Pulse 91   Temp 98.6 F (37 C) (Oral)   Resp 18   Ht 5\' 5"  (1.651 m)   Wt 106 lb (48.1 kg)   SpO2 96%   BMI 17.64 kg/m   Outpatient Encounter Medications as of 05/31/2020  Medication Sig  . Balsam Peru-Castor Oil (VENELEX) OINT Apply to sacrum and bilateral buttocks q shift & prn every shift  . citalopram (CELEXA) 10 MG tablet Take 20 mg by mouth at bedtime.   06/02/2020 denosumab (PROLIA) 60 MG/ML SOLN injection Inject 60 mg into the skin every 6 (six) months. Administer in upper arm, thigh, or abdomen  .  guaiFENesin-dextromethorphan (ROBITUSSIN DM) 100-10 MG/5ML syrup Take 15 mLs by mouth every 6 (six) hours as needed for cough.  Marland Kitchen ipratropium-albuterol (DUONEB) 0.5-2.5 (3) MG/3ML SOLN Take 3 mLs by nebulization every 6 (six) hours as needed.  . loratadine (CLARITIN) 10 MG tablet Take 10 mg by mouth daily.  . memantine (NAMENDA) 10 MG tablet Take 10 mg by mouth 2 (two) times daily.  . mirtazapine (REMERON) 7.5 MG tablet Take 7.5 mg by mouth at bedtime. For Appetite  . NON FORMULARY Diet Order: Dysphagia 1 (puree), continue honey thick liquids. No sausage, no gravy on meat - gravy in bowls.  . NON FORMULARY Magic Cup TID daily due to unplanned weight loss Three Times A Day Between Meals 10:00 AM, 02:00 PM, 08:00 PM  . omeprazole (PRILOSEC) 40 MG capsule Take 40 mg by mouth 2 (two) times daily.   . ondansetron (ZOFRAN) 4 MG tablet Take 4 mg by mouth 3 (three) times daily before meals.    No  facility-administered encounter medications on file as of 05/31/2020.     SIGNIFICANT DIAGNOSTIC EXAMS    LABS REVIEWED PREVIOUS   06-12-19: wbc 8.1; hgb 13.4; hct 42.6; mcv 100;0 plt 278; 08-11-19: hgb a1c 6.1  10-29-19: wbc 6.5; hgb 10.2; hct 34.5; mcv 84.8 plt 322; glucose 112; bun 21; creat 0.89; k+ 3.8; na++ 143; ca 9.0 liver normal albumin 3.1 hgb a1c 6.4  10-31-19: guaiac: + and - 11-05-19: hgb 9.8; hct 31.8 11-09-19: guaiac +  02-05-20: urine micro-albumin: 161.6  03-29-20; wbc 5.5; hgb 7.7; hct 26.8; mcv 77.2 plt 373; glucose 71; bun 19; creat 0.74; k+ 3.8; na++ 142; ca 8.3 liver normal albumin 3.0; hgb a1c 6.3   05-12-20: glucose 77; bun 16; creat 0.73; k+ 3.4; na++ 138; ca 9.1 liver normal albumin 2.9 tsh 1.060  NO NEW LABS.   Review of Systems  Unable to perform ROS: Dementia (unable to participate )   Physical Exam Constitutional:      General: She is not in acute distress.    Appearance: She is well-developed. She is not diaphoretic.  HENT:     Mouth/Throat:     Comments: Has significant thrush present throughout mouth  Neck:     Thyroid: No thyromegaly.  Cardiovascular:     Rate and Rhythm: Normal rate and regular rhythm.     Pulses: Normal pulses.     Heart sounds: Normal heart sounds.  Pulmonary:     Effort: Pulmonary effort is normal. No respiratory distress.     Breath sounds: Normal breath sounds.  Abdominal:     General: Bowel sounds are normal. There is no distension.     Palpations: Abdomen is soft.     Tenderness: There is no abdominal tenderness.  Musculoskeletal:        General: Normal range of motion.     Cervical back: Neck supple.     Right lower leg: No edema.     Left lower leg: No edema.  Lymphadenopathy:     Cervical: No cervical adenopathy.  Skin:    General: Skin is warm and dry.  Neurological:     Mental Status: She is alert. Mental status is at baseline.  Psychiatric:        Mood and Affect: Mood normal.       ASSESSMENT/  PLAN:  TODAY  1. Oral thrush:  Is worse Will begin diflucan 150 mg daily through 06-14-20.      MD is aware of resident's narcotic use  and is in agreement with current plan of care. We will attempt to wean resident as appropriate.  Ok Edwards NP [facility]  Contact [phone] Monday through Friday 8am- 5pm  After hours call [phone]

## 2020-06-01 DIAGNOSIS — R279 Unspecified lack of coordination: Secondary | ICD-10-CM | POA: Diagnosis not present

## 2020-06-01 DIAGNOSIS — R633 Feeding difficulties: Secondary | ICD-10-CM | POA: Diagnosis not present

## 2020-06-01 DIAGNOSIS — B370 Candidal stomatitis: Secondary | ICD-10-CM | POA: Insufficient documentation

## 2020-06-01 DIAGNOSIS — Z741 Need for assistance with personal care: Secondary | ICD-10-CM | POA: Diagnosis not present

## 2020-06-01 DIAGNOSIS — M810 Age-related osteoporosis without current pathological fracture: Secondary | ICD-10-CM | POA: Diagnosis not present

## 2020-06-02 DIAGNOSIS — R633 Feeding difficulties: Secondary | ICD-10-CM | POA: Diagnosis not present

## 2020-06-02 DIAGNOSIS — R279 Unspecified lack of coordination: Secondary | ICD-10-CM | POA: Diagnosis not present

## 2020-06-02 DIAGNOSIS — M810 Age-related osteoporosis without current pathological fracture: Secondary | ICD-10-CM | POA: Diagnosis not present

## 2020-06-02 DIAGNOSIS — Z741 Need for assistance with personal care: Secondary | ICD-10-CM | POA: Diagnosis not present

## 2020-06-03 DIAGNOSIS — R633 Feeding difficulties: Secondary | ICD-10-CM | POA: Diagnosis not present

## 2020-06-03 DIAGNOSIS — R279 Unspecified lack of coordination: Secondary | ICD-10-CM | POA: Diagnosis not present

## 2020-06-03 DIAGNOSIS — M810 Age-related osteoporosis without current pathological fracture: Secondary | ICD-10-CM | POA: Diagnosis not present

## 2020-06-03 DIAGNOSIS — Z741 Need for assistance with personal care: Secondary | ICD-10-CM | POA: Diagnosis not present

## 2020-06-03 DIAGNOSIS — Z1159 Encounter for screening for other viral diseases: Secondary | ICD-10-CM | POA: Diagnosis not present

## 2020-06-03 DIAGNOSIS — Z5189 Encounter for other specified aftercare: Secondary | ICD-10-CM | POA: Diagnosis not present

## 2020-06-04 DIAGNOSIS — M810 Age-related osteoporosis without current pathological fracture: Secondary | ICD-10-CM | POA: Diagnosis not present

## 2020-06-04 DIAGNOSIS — R279 Unspecified lack of coordination: Secondary | ICD-10-CM | POA: Diagnosis not present

## 2020-06-04 DIAGNOSIS — R633 Feeding difficulties: Secondary | ICD-10-CM | POA: Diagnosis not present

## 2020-06-04 DIAGNOSIS — Z741 Need for assistance with personal care: Secondary | ICD-10-CM | POA: Diagnosis not present

## 2020-06-07 DIAGNOSIS — Z1159 Encounter for screening for other viral diseases: Secondary | ICD-10-CM | POA: Diagnosis not present

## 2020-06-07 DIAGNOSIS — Z5189 Encounter for other specified aftercare: Secondary | ICD-10-CM | POA: Diagnosis not present

## 2020-06-08 DIAGNOSIS — M810 Age-related osteoporosis without current pathological fracture: Secondary | ICD-10-CM | POA: Diagnosis not present

## 2020-06-08 DIAGNOSIS — R633 Feeding difficulties: Secondary | ICD-10-CM | POA: Diagnosis not present

## 2020-06-08 DIAGNOSIS — R279 Unspecified lack of coordination: Secondary | ICD-10-CM | POA: Diagnosis not present

## 2020-06-08 DIAGNOSIS — Z741 Need for assistance with personal care: Secondary | ICD-10-CM | POA: Diagnosis not present

## 2020-06-09 DIAGNOSIS — Z741 Need for assistance with personal care: Secondary | ICD-10-CM | POA: Diagnosis not present

## 2020-06-09 DIAGNOSIS — R633 Feeding difficulties: Secondary | ICD-10-CM | POA: Diagnosis not present

## 2020-06-09 DIAGNOSIS — R279 Unspecified lack of coordination: Secondary | ICD-10-CM | POA: Diagnosis not present

## 2020-06-09 DIAGNOSIS — M810 Age-related osteoporosis without current pathological fracture: Secondary | ICD-10-CM | POA: Diagnosis not present

## 2020-06-10 DIAGNOSIS — M810 Age-related osteoporosis without current pathological fracture: Secondary | ICD-10-CM | POA: Diagnosis not present

## 2020-06-10 DIAGNOSIS — Z741 Need for assistance with personal care: Secondary | ICD-10-CM | POA: Diagnosis not present

## 2020-06-10 DIAGNOSIS — R279 Unspecified lack of coordination: Secondary | ICD-10-CM | POA: Diagnosis not present

## 2020-06-10 DIAGNOSIS — R633 Feeding difficulties: Secondary | ICD-10-CM | POA: Diagnosis not present

## 2020-06-11 ENCOUNTER — Encounter: Payer: Self-pay | Admitting: Adult Health

## 2020-06-11 ENCOUNTER — Non-Acute Institutional Stay: Payer: Self-pay | Admitting: Adult Health

## 2020-06-11 ENCOUNTER — Other Ambulatory Visit: Payer: Self-pay | Admitting: Adult Health

## 2020-06-11 DIAGNOSIS — Z741 Need for assistance with personal care: Secondary | ICD-10-CM | POA: Diagnosis not present

## 2020-06-11 DIAGNOSIS — M2041 Other hammer toe(s) (acquired), right foot: Secondary | ICD-10-CM | POA: Diagnosis not present

## 2020-06-11 DIAGNOSIS — F0390 Unspecified dementia without behavioral disturbance: Secondary | ICD-10-CM

## 2020-06-11 DIAGNOSIS — L603 Nail dystrophy: Secondary | ICD-10-CM | POA: Diagnosis not present

## 2020-06-11 DIAGNOSIS — B351 Tinea unguium: Secondary | ICD-10-CM | POA: Diagnosis not present

## 2020-06-11 DIAGNOSIS — R279 Unspecified lack of coordination: Secondary | ICD-10-CM | POA: Diagnosis not present

## 2020-06-11 DIAGNOSIS — E1151 Type 2 diabetes mellitus with diabetic peripheral angiopathy without gangrene: Secondary | ICD-10-CM | POA: Diagnosis not present

## 2020-06-11 DIAGNOSIS — Z7984 Long term (current) use of oral hypoglycemic drugs: Secondary | ICD-10-CM | POA: Diagnosis not present

## 2020-06-11 DIAGNOSIS — R633 Feeding difficulties: Secondary | ICD-10-CM | POA: Diagnosis not present

## 2020-06-11 DIAGNOSIS — F339 Major depressive disorder, recurrent, unspecified: Secondary | ICD-10-CM

## 2020-06-11 DIAGNOSIS — M810 Age-related osteoporosis without current pathological fracture: Secondary | ICD-10-CM | POA: Diagnosis not present

## 2020-06-11 MED ORDER — DRONABINOL 2.5 MG PO CAPS: 2.5000 mg | ORAL_CAPSULE | Freq: Every day | ORAL | 0 refills | Status: DC

## 2020-06-14 DIAGNOSIS — Z5189 Encounter for other specified aftercare: Secondary | ICD-10-CM | POA: Diagnosis not present

## 2020-06-14 DIAGNOSIS — R279 Unspecified lack of coordination: Secondary | ICD-10-CM | POA: Diagnosis not present

## 2020-06-14 DIAGNOSIS — M810 Age-related osteoporosis without current pathological fracture: Secondary | ICD-10-CM | POA: Diagnosis not present

## 2020-06-14 DIAGNOSIS — R633 Feeding difficulties: Secondary | ICD-10-CM | POA: Diagnosis not present

## 2020-06-14 DIAGNOSIS — Z1159 Encounter for screening for other viral diseases: Secondary | ICD-10-CM | POA: Diagnosis not present

## 2020-06-14 DIAGNOSIS — Z741 Need for assistance with personal care: Secondary | ICD-10-CM | POA: Diagnosis not present

## 2020-06-15 DIAGNOSIS — R279 Unspecified lack of coordination: Secondary | ICD-10-CM | POA: Diagnosis not present

## 2020-06-15 DIAGNOSIS — Z741 Need for assistance with personal care: Secondary | ICD-10-CM | POA: Diagnosis not present

## 2020-06-15 DIAGNOSIS — R633 Feeding difficulties: Secondary | ICD-10-CM | POA: Diagnosis not present

## 2020-06-15 DIAGNOSIS — M810 Age-related osteoporosis without current pathological fracture: Secondary | ICD-10-CM | POA: Diagnosis not present

## 2020-06-15 NOTE — Progress Notes (Signed)
[facility] Room Number: 115/W Place of Service:  SNF (31)   CODE STATUS: dnr   No Known Allergies  Chief Complaint  Patient presents with  . Acute Visit    medication review     HPI:  She is presently taking celexa 20 mg daily she has failed a wean in the past. She has been slowly losing weight from 115 pounds to her current weight 106 pounds. Her appetite is poor. She has failed remeron to help with appetite; which remains poor.  She has had a fall without injury. There are no reports of agitation; anxiety; or uncontrolled pain.    Past Medical History:  Diagnosis Date  . Asthma   . Dementia (HCC)   . Diabetes mellitus   . H. pylori infection MAR 2014  . Hypertension   . Stroke [facility])     Past Surgical History:  Procedure Laterality Date  . CHOLECYSTECTOMY    . COLECTOMY     secondary to diverticulitis  . ESOPHAGOGASTRODUODENOSCOPY (EGD) WITH ESOPHAGEAL DILATION N/A 01/07/2013   XLK:[id number] ring was found at the gastroesophageal junction/SINGLE DUODENAL DIVERTICULUM    Social History   Socioeconomic History  . Marital status: Widowed    Spouse name: Not on file  . Number of children: Not on file  . Years of education: Not on file  . Highest education level: Not on file  Occupational History  . Occupation: retired   Tobacco Use  . Smoking status: Never Smoker  . Smokeless tobacco: Never Used  Vaping Use  . Vaping Use: Never used  Substance and Sexual Activity  . Alcohol use: No  . Drug use: No  . Sexual activity: Not Currently  Other Topics Concern  . Not on file  Social History Narrative   Is a long term resident of [facility]    Social Determinants of Health   Financial Resource Strain:   . Difficulty of Paying Living Expenses: Not on file  Food Insecurity:   . Worried About Programme researcher, broadcasting/film/video in the Last Year: Not on file  . Ran Out of Food in the Last Year: Not on file  Transportation Needs:   . Lack of Transportation  (Medical): Not on file  . Lack of Transportation (Non-Medical): Not on file  Physical Activity:   . Days of Exercise per Week: Not on file  . Minutes of Exercise per Session: Not on file  Stress:   . Feeling of Stress : Not on file  Social Connections:   . Frequency of Communication with Friends and Family: Not on file  . Frequency of Social Gatherings with Friends and Family: Not on file  . Attends Religious Services: Not on file  . Active Member of Clubs or Organizations: Not on file  . Attends Banker Meetings: Not on file  . Marital Status: Not on file  Intimate Partner Violence:   . Fear of Current or Ex-Partner: Not on file  . Emotionally Abused: Not on file  . Physically Abused: Not on file  . Sexually Abused: Not on file   Family History  Problem Relation Age of Onset  . Hypertension Mother   . Colon cancer Neg Hx       VITAL SIGNS BP 112/80   Pulse 78   Temp 98.2 F (36.8 C) (Oral)   Resp 20   Ht 5\' 5"  (1.651 m)   Wt 106 lb (48.1 kg)   SpO2 99%  BMI 17.64 kg/m   Outpatient Encounter Medications as of 06/11/2020  Medication Sig  . Balsam Peru-Castor Oil (VENELEX) OINT Apply to sacrum and bilateral buttocks q shift & prn every shift  . citalopram (CELEXA) 10 MG tablet Take 20 mg by mouth at bedtime.   Marland Kitchen denosumab (PROLIA) 60 MG/ML SOLN injection Inject 60 mg into the skin every 6 (six) months. Administer in upper arm, thigh, or abdomen  . dronabinol (MARINOL) 2.5 MG capsule Take 1 capsule (2.5 mg total) by mouth daily before supper.  Marland Kitchen guaiFENesin-dextromethorphan (ROBITUSSIN DM) 100-10 MG/5ML syrup Take 15 mLs by mouth every 6 (six) hours as needed for cough.  . loratadine (CLARITIN) 10 MG tablet Take 10 mg by mouth daily.  . memantine (NAMENDA) 10 MG tablet Take 10 mg by mouth 2 (two) times daily.  . NON FORMULARY Diet Order: Dysphagia 1 (puree), continue honey thick liquids. No sausage, no gravy on meat - gravy in bowls.  . NON FORMULARY Magic  Cup TID daily due to unplanned weight loss Three Times A Day Between Meals 10:00 AM, 02:00 PM, 08:00 PM  . omeprazole (PRILOSEC) 40 MG capsule Take 40 mg by mouth 2 (two) times daily.   . ondansetron (ZOFRAN) 4 MG tablet Take 4 mg by mouth 3 (three) times daily before meals.    No facility-administered encounter medications on file as of 06/11/2020.     SIGNIFICANT DIAGNOSTIC EXAMS  LABS REVIEWED PREVIOUS   06-12-19: wbc 8.1; hgb 13.4; hct 42.6; mcv 100;0 plt 278; 08-11-19: hgb a1c 6.1  10-29-19: wbc 6.5; hgb 10.2; hct 34.5; mcv 84.8 plt 322; glucose 112; bun 21; creat 0.89; k+ 3.8; na++ 143; ca 9.0 liver normal albumin 3.1 hgb a1c 6.4  10-31-19: guaiac: + and - 11-05-19: hgb 9.8; hct 31.8 11-09-19: guaiac +  02-05-20: urine micro-albumin: 161.6  03-29-20; wbc 5.5; hgb 7.7; hct 26.8; mcv 77.2 plt 373; glucose 71; bun 19; creat 0.74; k+ 3.8; na++ 142; ca 8.3 liver normal albumin 3.0; hgb a1c 6.3   05-12-20: glucose 77; bun 16; creat 0.73; k+ 3.4; na++ 138; ca 9.1 liver normal albumin 2.9 tsh 1.060  NO NEW LABS.   Review of Systems  Unable to perform ROS: Dementia (unable to participate)    Physical Exam Constitutional:      General: She is not in acute distress.    Appearance: She is well-developed. She is not diaphoretic.  Neck:     Thyroid: No thyromegaly.  Cardiovascular:     Rate and Rhythm: Normal rate and regular rhythm.     Pulses: Normal pulses.     Heart sounds: Normal heart sounds.  Pulmonary:     Effort: Pulmonary effort is normal. No respiratory distress.     Breath sounds: Normal breath sounds.  Abdominal:     General: Bowel sounds are normal. There is no distension.     Palpations: Abdomen is soft.     Tenderness: There is no abdominal tenderness.  Musculoskeletal:        General: Normal range of motion.     Cervical back: Neck supple.     Right lower leg: No edema.     Left lower leg: No edema.  Lymphadenopathy:     Cervical: No cervical adenopathy.  Skin:     General: Skin is warm and dry.  Neurological:     Mental Status: She is alert. Mental status is at baseline.  Psychiatric:        Mood and Affect: Mood  normal.       ASSESSMENT/ PLAN:  TODAY  1. Major depression chronic recurrent 2. Dementia without behavioral disturbance unspecified dementia type  Will continue celexa 20 mg daily  Will begin marinol 2.5 mg daily prior to supper for 30 days to stimulate appetite Will monitor her status.   MD is aware of resident's narcotic use and is in agreement with current plan of care. We will attempt to wean resident as appropriate.  Synthia Innocent NP [facility] Adult [facility]  Contact [phone] Monday through Friday 8am- 5pm  After hours call [phone]

## 2020-06-16 DIAGNOSIS — R633 Feeding difficulties: Secondary | ICD-10-CM | POA: Diagnosis not present

## 2020-06-16 DIAGNOSIS — Z741 Need for assistance with personal care: Secondary | ICD-10-CM | POA: Diagnosis not present

## 2020-06-16 DIAGNOSIS — R279 Unspecified lack of coordination: Secondary | ICD-10-CM | POA: Diagnosis not present

## 2020-06-16 DIAGNOSIS — M810 Age-related osteoporosis without current pathological fracture: Secondary | ICD-10-CM | POA: Diagnosis not present

## 2020-06-17 DIAGNOSIS — Z741 Need for assistance with personal care: Secondary | ICD-10-CM | POA: Diagnosis not present

## 2020-06-17 DIAGNOSIS — M810 Age-related osteoporosis without current pathological fracture: Secondary | ICD-10-CM | POA: Diagnosis not present

## 2020-06-17 DIAGNOSIS — R633 Feeding difficulties: Secondary | ICD-10-CM | POA: Diagnosis not present

## 2020-06-17 DIAGNOSIS — Z5189 Encounter for other specified aftercare: Secondary | ICD-10-CM | POA: Diagnosis not present

## 2020-06-17 DIAGNOSIS — Z1159 Encounter for screening for other viral diseases: Secondary | ICD-10-CM | POA: Diagnosis not present

## 2020-06-17 DIAGNOSIS — R279 Unspecified lack of coordination: Secondary | ICD-10-CM | POA: Diagnosis not present

## 2020-06-18 DIAGNOSIS — R279 Unspecified lack of coordination: Secondary | ICD-10-CM | POA: Diagnosis not present

## 2020-06-18 DIAGNOSIS — M810 Age-related osteoporosis without current pathological fracture: Secondary | ICD-10-CM | POA: Diagnosis not present

## 2020-06-18 DIAGNOSIS — Z741 Need for assistance with personal care: Secondary | ICD-10-CM | POA: Diagnosis not present

## 2020-06-18 DIAGNOSIS — R633 Feeding difficulties: Secondary | ICD-10-CM | POA: Diagnosis not present

## 2020-06-21 DIAGNOSIS — Z5189 Encounter for other specified aftercare: Secondary | ICD-10-CM | POA: Diagnosis not present

## 2020-06-21 DIAGNOSIS — Z1159 Encounter for screening for other viral diseases: Secondary | ICD-10-CM | POA: Diagnosis not present

## 2020-06-23 ENCOUNTER — Encounter: Payer: Self-pay | Admitting: Adult Health

## 2020-06-23 ENCOUNTER — Non-Acute Institutional Stay (SKILLED_NURSING_FACILITY): Payer: Medicare Other | Admitting: Adult Health

## 2020-06-23 DIAGNOSIS — R627 Adult failure to thrive: Secondary | ICD-10-CM

## 2020-06-23 DIAGNOSIS — F0390 Unspecified dementia without behavioral disturbance: Secondary | ICD-10-CM

## 2020-06-23 NOTE — Progress Notes (Signed)
Location:    [facility] Room Number: 115/W Place of Service:  SNF (31)   CODE STATUS: DNR  No Known Allergies  Chief Complaint  Patient presents with   Acute Visit    Status    HPI:  She continues to lose weight; her current weight is 92.2 pounds. Her po intake remains poor. She is spending more time in bed. She has a no tube feeding order in place. I have spoken with her family regarding her overall status and poor prognosis. We have decided to change her focus to comfort only. We have reviewed her medications and will stop those that do not provide direct comfort for her. Her family has verbalized understanding and is in agreement with this plan.   Past Medical History:  Diagnosis Date   Asthma    Dementia (HCC)    Diabetes mellitus    H. pylori infection MAR 2014   Hypertension    Stroke [facility])     Past Surgical History:  Procedure Laterality Date   CHOLECYSTECTOMY     COLECTOMY     secondary to diverticulitis   ESOPHAGOGASTRODUODENOSCOPY (EGD) WITH ESOPHAGEAL DILATION N/A 01/07/2013   [id number] ring was found at the gastroesophageal junction/SINGLE DUODENAL DIVERTICULUM    Social History   Socioeconomic History   Marital status: Widowed    Spouse name: Not on file   Number of children: Not on file   Years of education: Not on file   Highest education level: Not on file  Occupational History   Occupation: retired   Tobacco Use   Smoking status: Never Smoker   Smokeless tobacco: Never Used  Building services engineer Use: Never used  Substance and Sexual Activity   Alcohol use: No   Drug use: No   Sexual activity: Not Currently  Other Topics Concern   Not on file  Social History Narrative   Is a long term resident of [facility]    Social Determinants of Health   Financial Resource Strain:    Difficulty of Paying Living Expenses: Not on file  Food Insecurity:    Worried About Programme researcher, broadcasting/film/video in the Last Year:  Not on file   [employer] of Food in the Last Year: Not on file  Transportation Needs:    Lack of Transportation (Medical): Not on file   Lack of Transportation (Non-Medical): Not on file  Physical Activity:    Days of Exercise per Week: Not on file   Minutes of Exercise per Session: Not on file  Stress:    Feeling of Stress : Not on file  Social Connections:    Frequency of Communication with Friends and Family: Not on file   Frequency of Social Gatherings with Friends and Family: Not on file   Attends Religious Services: Not on file   Active Member of Clubs or Organizations: Not on file   Attends Banker Meetings: Not on file   Marital Status: Not on file  Intimate Partner Violence:    Fear of Current or Ex-Partner: Not on file   Emotionally Abused: Not on file   Physically Abused: Not on file   Sexually Abused: Not on file   Family History  Problem Relation Age of Onset   Hypertension Mother    Colon cancer Neg Hx       VITAL SIGNS BP 109/63    Pulse 84    Temp 98.6 F (37 C) (Oral)    Resp  18    Ht 5\' 5"  (1.651 m)    Wt 97 lb 9.6 oz (44.3 kg)    SpO2 99%    BMI 16.24 kg/m   Outpatient Encounter Medications as of 06/23/2020  Medication Sig   Balsam [place]-Castor Oil (VENELEX) OINT Apply to sacrum and bilateral buttocks q shift & prn every shift   citalopram (CELEXA) 10 MG tablet Take 20 mg by mouth at bedtime.    guaiFENesin-dextromethorphan (ROBITUSSIN DM) 100-10 MG/5ML syrup Take 15 mLs by mouth every 6 (six) hours as needed for cough.   loratadine (CLARITIN) 10 MG tablet Take 10 mg by mouth daily.   NON FORMULARY Diet Order: Dysphagia 1 (puree), continue honey thick liquids. No sausage, no gravy on meat - gravy in bowls.   NON FORMULARY Magic Cup TID daily due to unplanned weight loss Three Times A Day Between Meals 10:00 AM, 02:00 PM, 08:00 PM   omeprazole (PRILOSEC) 40 MG capsule Take 40 mg by mouth 2 (two) times daily.     ondansetron (ZOFRAN) 4 MG tablet Take 4 mg by mouth 3 (three) times daily before meals.    [DISCONTINUED] denosumab (PROLIA) 60 MG/ML SOLN injection Inject 60 mg into the skin every 6 (six) months. Administer in upper arm, thigh, or abdomen   [DISCONTINUED] dronabinol (MARINOL) 2.5 MG capsule Take 1 capsule (2.5 mg total) by mouth daily before supper.   [DISCONTINUED] memantine (NAMENDA) 10 MG tablet Take 10 mg by mouth 2 (two) times daily.   No facility-administered encounter medications on file as of 06/23/2020.     SIGNIFICANT DIAGNOSTIC EXAMS   LABS REVIEWED PREVIOUS   08-11-19: hgb a1c 6.1  10-29-19: wbc 6.5; hgb 10.2; hct 34.5; mcv 84.8 plt 322; glucose 112; bun 21; creat 0.89; k+ 3.8; na++ 143; ca 9.0 liver normal albumin 3.1 hgb a1c 6.4  10-31-19: guaiac: + and - 11-05-19: hgb 9.8; hct 31.8 11-09-19: guaiac +  02-05-20: urine micro-albumin: 161.6  03-29-20; wbc 5.5; hgb 7.7; hct 26.8; mcv 77.2 plt 373; glucose 71; bun 19; creat 0.74; k+ 3.8; na++ 142; ca 8.3 liver normal albumin 3.0; hgb a1c 6.3   05-12-20: glucose 77; bun 16; creat 0.73; k+ 3.4; na++ 138; ca 9.1 liver normal albumin 2.9 tsh 1.060  NO NEW LABS.   Review of Systems  Unable to perform ROS: Dementia (unable to participate )    Physical Exam Constitutional:      General: She is not in acute distress.    Appearance: She is well-developed. She is not diaphoretic.  Neck:     Thyroid: No thyromegaly.  Cardiovascular:     Rate and Rhythm: Normal rate and regular rhythm.     Pulses: Normal pulses.     Heart sounds: Normal heart sounds.  Pulmonary:     Effort: Pulmonary effort is normal. No respiratory distress.     Breath sounds: Normal breath sounds.  Abdominal:     General: Bowel sounds are normal. There is no distension.     Palpations: Abdomen is soft.     Tenderness: There is no abdominal tenderness.  Musculoskeletal:        General: Normal range of motion.     Cervical back: Neck supple.     Right lower  leg: No edema.     Left lower leg: No edema.  Lymphadenopathy:     Cervical: No cervical adenopathy.  Skin:    General: Skin is warm and dry.  Neurological:     Mental Status:  She is alert. Mental status is at baseline.  Psychiatric:        Mood and Affect: Mood normal.       ASSESSMENT/ PLAN:  TODAY  1. Dementia without behavioral disturbance unspecified dementia type 2. Failure to thrive in adult  She continues to decline Will stop: namenda; aricept prolia Will focus her care upon comfort.   MD is aware of resident's narcotic use and is in agreement with current plan of care. We will attempt to wean resident as appropriate.  Synthia Innocent NP [facility] Adult Medicine  Contact [phone] Monday through Friday 8am- 5pm  After hours call [phone]

## 2020-06-25 DIAGNOSIS — Z5189 Encounter for other specified aftercare: Secondary | ICD-10-CM | POA: Diagnosis not present

## 2020-06-25 DIAGNOSIS — Z1159 Encounter for screening for other viral diseases: Secondary | ICD-10-CM | POA: Diagnosis not present

## 2020-06-28 DIAGNOSIS — Z1159 Encounter for screening for other viral diseases: Secondary | ICD-10-CM | POA: Diagnosis not present

## 2020-06-28 DIAGNOSIS — Z5189 Encounter for other specified aftercare: Secondary | ICD-10-CM | POA: Diagnosis not present

## 2020-06-29 ENCOUNTER — Non-Acute Institutional Stay (SKILLED_NURSING_FACILITY): Payer: Medicare Other | Admitting: Adult Health

## 2020-06-29 ENCOUNTER — Encounter: Payer: Self-pay | Admitting: Adult Health

## 2020-06-29 DIAGNOSIS — M810 Age-related osteoporosis without current pathological fracture: Secondary | ICD-10-CM | POA: Diagnosis not present

## 2020-06-29 DIAGNOSIS — F339 Major depressive disorder, recurrent, unspecified: Secondary | ICD-10-CM

## 2020-06-29 DIAGNOSIS — D500 Iron deficiency anemia secondary to blood loss (chronic): Secondary | ICD-10-CM

## 2020-06-29 NOTE — Progress Notes (Signed)
Location:    [facility] Room Number: 115/W Place of Service:  SNF (31)   CODE STATUS: DNR  No Known Allergies  Chief Complaint  Patient presents with  . Medical Management of Chronic Issues           Anemia due to chronic blood loss:     Major depression recurrent chronic:   Age related osteoporosis without current pathological fracture    HPI:  She is a 86 year old long term resident of this facility being seen for the management of her chronic illnesses:Anemia due to chronic blood loss:     Major depression recurrent chronic:   Age related osteoporosis without current pathological fracture. There are no reports of uncontrolled pain. The focus of her care is comfort at this time. She continues to lose weight and to decline on a global basis. There are no reports of agitation or anxiety.    Past Medical History:  Diagnosis Date  . Asthma   . Dementia (HCC)   . Diabetes mellitus   . H. pylori infection MAR 2014  . Hypertension   . Stroke [facility])     Past Surgical History:  Procedure Laterality Date  . CHOLECYSTECTOMY    . COLECTOMY     secondary to diverticulitis  . ESOPHAGOGASTRODUODENOSCOPY (EGD) WITH ESOPHAGEAL DILATION N/A 01/07/2013   JOI:NOMVEHMC ring was found at the gastroesophageal junction/SINGLE DUODENAL DIVERTICULUM    Social History   Socioeconomic History  . Marital status: Widowed    Spouse name: Not on file  . Number of children: Not on file  . Years of education: Not on file  . Highest education level: Not on file  Occupational History  . Occupation: retired   Tobacco Use  . Smoking status: Never Smoker  . Smokeless tobacco: Never Used  Vaping Use  . Vaping Use: Never used  Substance and Sexual Activity  . Alcohol use: No  . Drug use: No  . Sexual activity: Not Currently  Other Topics Concern  . Not on file  Social History Narrative   Is a long term resident of [facility]    Social Determinants of Health   Financial  Resource Strain:   . Difficulty of Paying Living Expenses: Not on file  Food Insecurity:   . Worried About Programme researcher, broadcasting/film/video in the Last Year: Not on file  . Ran Out of Food in the Last Year: Not on file  Transportation Needs:   . Lack of Transportation (Medical): Not on file  . Lack of Transportation (Non-Medical): Not on file  Physical Activity:   . Days of Exercise per Week: Not on file  . Minutes of Exercise per Session: Not on file  Stress:   . Feeling of Stress : Not on file  Social Connections:   . Frequency of Communication with Friends and Family: Not on file  . Frequency of Social Gatherings with Friends and Family: Not on file  . Attends Religious Services: Not on file  . Active Member of Clubs or Organizations: Not on file  . Attends Banker Meetings: Not on file  . Marital Status: Not on file  Intimate Partner Violence:   . Fear of Current or Ex-Partner: Not on file  . Emotionally Abused: Not on file  . Physically Abused: Not on file  . Sexually Abused: Not on file   Family History  Problem Relation Age of Onset  . Hypertension Mother   . Colon cancer Neg  Hx       VITAL SIGNS BP 114/69   Pulse 88   Temp 98.2 F (36.8 C) (Oral)   Resp 20   Ht 5\' 5"  (1.651 m)   Wt 92 lb 6.4 oz (41.9 kg)   SpO2 99%   BMI 15.38 kg/m   Outpatient Encounter Medications as of 06/29/2020  Medication Sig  . Balsam [place]-Castor Oil (VENELEX) OINT Apply to sacrum and bilateral buttocks q shift & prn every shift  . citalopram (CELEXA) 10 MG tablet Take 20 mg by mouth at bedtime.   07/01/2020 guaiFENesin-dextromethorphan (ROBITUSSIN DM) 100-10 MG/5ML syrup Take 15 mLs by mouth every 6 (six) hours as needed for cough.  . loratadine (CLARITIN) 10 MG tablet Take 10 mg by mouth daily.  . NON FORMULARY Diet Order: Dysphagia 1 (puree), continue honey thick liquids. No sausage, no gravy on meat - gravy in bowls.  . NON FORMULARY Magic Cup TID daily due to unplanned weight loss Three  Times A Day Between Meals 10:00 AM, 02:00 PM, 08:00 PM  . omeprazole (PRILOSEC) 40 MG capsule Take 40 mg by mouth 2 (two) times daily.   . ondansetron (ZOFRAN) 4 MG tablet Take 4 mg by mouth 3 (three) times daily before meals.    No facility-administered encounter medications on file as of 06/29/2020.     SIGNIFICANT DIAGNOSTIC EXAMS   LABS REVIEWED PREVIOUS   08-11-19: hgb a1c 6.1  10-29-19: wbc 6.5; hgb 10.2; hct 34.5; mcv 84.8 plt 322; glucose 112; bun 21; creat 0.89; k+ 3.8; na++ 143; ca 9.0 liver normal albumin 3.1 hgb a1c 6.4  10-31-19: guaiac: + and - 11-05-19: hgb 9.8; hct 31.8 11-09-19: guaiac +  02-05-20: urine micro-albumin: 161.6  03-29-20; wbc 5.5; hgb 7.7; hct 26.8; mcv 77.2 plt 373; glucose 71; bun 19; creat 0.74; k+ 3.8; na++ 142; ca 8.3 liver normal albumin 3.0; hgb a1c 6.3   05-12-20: glucose 77; bun 16; creat 0.73; k+ 3.4; na++ 138; ca 9.1 liver normal albumin 2.9 tsh 1.060   NO NEW LABS.    Review of Systems  Unable to perform ROS: Dementia (unable to participate )    Physical Exam Constitutional:      General: She is not in acute distress.    Appearance: She is underweight. She is not diaphoretic.  Neck:     Thyroid: No thyromegaly.  Cardiovascular:     Rate and Rhythm: Normal rate and regular rhythm.     Pulses: Normal pulses.     Heart sounds: Normal heart sounds.  Pulmonary:     Effort: Pulmonary effort is normal. No respiratory distress.     Breath sounds: Normal breath sounds.  Abdominal:     General: Bowel sounds are normal. There is no distension.     Palpations: Abdomen is soft.     Tenderness: There is no abdominal tenderness.  Musculoskeletal:        General: Normal range of motion.     Cervical back: Neck supple.     Right lower leg: No edema.     Left lower leg: No edema.  Lymphadenopathy:     Cervical: No cervical adenopathy.  Skin:    General: Skin is warm and dry.  Neurological:     Mental Status: She is alert. Mental status is at  baseline.  Psychiatric:        Mood and Affect: Mood normal.     ASSESSMENT/ PLAN:  TODAY  1. Anemia due to chronic blood loss:  is without change hgb 7.7 is unable to tolerate iron; not a candidate for aggressive workup will continue to monitor her status.   2. Major depression recurrent chronic: is stable will continue celexa 20 mg daily   3. Age related osteoporosis without current pathological fracture is stable is off medications will monitor     PREVIOUS  4. Dementia without behavioral disturbances unspecified dementia type: she is slowly declining and is losing weight; her current  weight is 92 pounds  weight loss is an unfortunate but expected outcome in the late states of this disease process.   5. Essential hypertension: is stable b/p 119/63 will continue to monitor her status.   6. Dysphagia oropharyngeal phase: no signs of aspiration present will continue honey thick liquids.   7. Chronic CVA: is stable will continue to monitor her status.   8. Chronic non-seasonal allergic rhinitis: is stable will continue clairitin 10 mg daily   9. GERD without esophagitis: is stable will continue prilosec 40 mg twice daily and zofran 4 mg prior to meals.         MD is aware of resident's narcotic use and is in agreement with current plan of care. We will attempt to wean resident as appropriate.  Synthia Innocent NP [facility]  Contact [phone] Monday through Friday 8am- 5pm  After hours call [phone]

## 2020-07-01 DIAGNOSIS — Z5189 Encounter for other specified aftercare: Secondary | ICD-10-CM | POA: Diagnosis not present

## 2020-07-01 DIAGNOSIS — Z1159 Encounter for screening for other viral diseases: Secondary | ICD-10-CM | POA: Diagnosis not present

## 2020-07-05 DIAGNOSIS — Z1159 Encounter for screening for other viral diseases: Secondary | ICD-10-CM | POA: Diagnosis not present

## 2020-07-05 DIAGNOSIS — Z5189 Encounter for other specified aftercare: Secondary | ICD-10-CM | POA: Diagnosis not present

## 2020-07-16 DEATH — deceased
